# Patient Record
Sex: Female | Born: 1955 | Race: Black or African American | Hispanic: No | Marital: Married | Smoking: Former smoker
Health system: Southern US, Community
[De-identification: ages and names within clinical notes are randomized; demographics above are authoritative.]

## PROBLEM LIST (undated history)

## (undated) DIAGNOSIS — F419 Anxiety disorder, unspecified: Secondary | ICD-10-CM

## (undated) DIAGNOSIS — H9319 Tinnitus, unspecified ear: Secondary | ICD-10-CM

## (undated) DIAGNOSIS — R7303 Prediabetes: Secondary | ICD-10-CM

## (undated) DIAGNOSIS — M1990 Unspecified osteoarthritis, unspecified site: Secondary | ICD-10-CM

## (undated) DIAGNOSIS — G4730 Sleep apnea, unspecified: Secondary | ICD-10-CM

## (undated) DIAGNOSIS — R0602 Shortness of breath: Secondary | ICD-10-CM

## (undated) DIAGNOSIS — M25551 Pain in right hip: Secondary | ICD-10-CM

## (undated) DIAGNOSIS — R000 Tachycardia, unspecified: Secondary | ICD-10-CM

## (undated) DIAGNOSIS — I10 Essential (primary) hypertension: Secondary | ICD-10-CM

## (undated) DIAGNOSIS — L409 Psoriasis, unspecified: Secondary | ICD-10-CM

## (undated) DIAGNOSIS — I4891 Unspecified atrial fibrillation: Secondary | ICD-10-CM

## (undated) DIAGNOSIS — I499 Cardiac arrhythmia, unspecified: Secondary | ICD-10-CM

## (undated) DIAGNOSIS — Z86010 Personal history of colonic polyps: Secondary | ICD-10-CM

## (undated) DIAGNOSIS — M87051 Idiopathic aseptic necrosis of right femur: Secondary | ICD-10-CM

## (undated) HISTORY — DX: Morbid (severe) obesity due to excess calories: E66.01

## (undated) HISTORY — DX: Pain in right hip: M25.551

## (undated) HISTORY — DX: Unspecified atrial fibrillation: I48.91

## (undated) HISTORY — DX: Idiopathic aseptic necrosis of right femur: M87.051

## (undated) HISTORY — DX: Tachycardia, unspecified: R00.0

## (undated) HISTORY — PX: JOINT REPLACEMENT: SHX530

## (undated) HISTORY — DX: Essential (primary) hypertension: I10

## (undated) HISTORY — DX: Psoriasis, unspecified: L40.9

## (undated) HISTORY — DX: Tinnitus, unspecified ear: H93.19

## (undated) HISTORY — PX: COLONOSCOPY: SHX174

## (undated) HISTORY — DX: Personal history of colonic polyps: Z86.010

---

## 2001-09-12 ENCOUNTER — Other Ambulatory Visit: Admission: RE | Admit: 2001-09-12 | Discharge: 2001-09-12 | Payer: Self-pay | Admitting: Family Medicine

## 2002-12-21 ENCOUNTER — Other Ambulatory Visit: Admission: RE | Admit: 2002-12-21 | Discharge: 2002-12-21 | Payer: Self-pay | Admitting: Family Medicine

## 2005-02-15 ENCOUNTER — Ambulatory Visit (HOSPITAL_COMMUNITY): Admission: RE | Admit: 2005-02-15 | Discharge: 2005-02-15 | Payer: Self-pay | Admitting: Geriatric Medicine

## 2005-08-16 DIAGNOSIS — L409 Psoriasis, unspecified: Secondary | ICD-10-CM | POA: Insufficient documentation

## 2007-06-14 ENCOUNTER — Other Ambulatory Visit: Admission: RE | Admit: 2007-06-14 | Discharge: 2007-06-14 | Payer: Self-pay | Admitting: Family Medicine

## 2008-01-02 ENCOUNTER — Emergency Department (HOSPITAL_COMMUNITY): Admission: EM | Admit: 2008-01-02 | Discharge: 2008-01-02 | Payer: Self-pay | Admitting: Emergency Medicine

## 2008-02-22 ENCOUNTER — Ambulatory Visit (HOSPITAL_BASED_OUTPATIENT_CLINIC_OR_DEPARTMENT_OTHER): Admission: RE | Admit: 2008-02-22 | Discharge: 2008-02-22 | Payer: Self-pay | Admitting: Family Medicine

## 2008-03-21 ENCOUNTER — Encounter: Admission: RE | Admit: 2008-03-21 | Discharge: 2008-03-21 | Payer: Self-pay | Admitting: Family Medicine

## 2008-10-02 ENCOUNTER — Emergency Department (HOSPITAL_COMMUNITY): Admission: EM | Admit: 2008-10-02 | Discharge: 2008-10-02 | Payer: Self-pay | Admitting: Family Medicine

## 2009-06-13 ENCOUNTER — Encounter: Admission: RE | Admit: 2009-06-13 | Discharge: 2009-06-13 | Payer: Self-pay | Admitting: Family Medicine

## 2009-08-16 LAB — HM PAP SMEAR

## 2010-05-20 ENCOUNTER — Emergency Department (HOSPITAL_COMMUNITY): Admission: EM | Admit: 2010-05-20 | Discharge: 2010-05-20 | Payer: Self-pay | Admitting: Family Medicine

## 2010-06-22 ENCOUNTER — Encounter: Admission: RE | Admit: 2010-06-22 | Discharge: 2010-06-22 | Payer: Self-pay | Admitting: Family Medicine

## 2010-09-20 ENCOUNTER — Other Ambulatory Visit: Payer: Self-pay | Admitting: Family Medicine

## 2010-09-20 ENCOUNTER — Encounter: Payer: Self-pay | Admitting: Family Medicine

## 2010-09-20 ENCOUNTER — Ambulatory Visit (INDEPENDENT_AMBULATORY_CARE_PROVIDER_SITE_OTHER): Payer: Self-pay | Admitting: Family Medicine

## 2010-09-20 ENCOUNTER — Ambulatory Visit
Admission: RE | Admit: 2010-09-20 | Discharge: 2010-09-20 | Disposition: A | Discharge status: Home / Self Care | Payer: Self-pay | Source: Ambulatory Visit | Attending: Family Medicine | Admitting: Family Medicine

## 2010-09-20 DIAGNOSIS — R05 Cough: Secondary | ICD-10-CM

## 2010-09-20 DIAGNOSIS — J069 Acute upper respiratory infection, unspecified: Secondary | ICD-10-CM

## 2010-09-20 DIAGNOSIS — R059 Cough, unspecified: Secondary | ICD-10-CM

## 2010-09-22 ENCOUNTER — Telehealth (INDEPENDENT_AMBULATORY_CARE_PROVIDER_SITE_OTHER): Payer: Self-pay | Admitting: *Deleted

## 2010-10-01 NOTE — Progress Notes (Signed)
  Phone Note Outgoing Call   Call placed by: Clemens Catholic LPN,  September 22, 2010 3:59 PM Call placed to: Patient Summary of Call: call back: left message to call back if any questions or concerns. Initial call taken by: Clemens Catholic LPN,  September 22, 2010 3:59 PM

## 2010-10-01 NOTE — Assessment & Plan Note (Signed)
Summary: COUGH/WSE Room 2   Vital Signs:  Patient Profile:   55 Years Old Female CC:      Cough x 3 weeks Height:     66 inches Weight:      230 pounds O2 Sat:      97 % O2 treatment:    Room Air Temp:     98.6 degrees F oral Pulse rate:   72 / minute Pulse rhythm:   regular Resp:     18 per minute BP sitting:   145 / 88  (left arm) Cuff size:   large  Vitals Entered By: Emilio Math (September 20, 2010 2:09 PM)                  Current Allergies: No known allergies History of Present Illness Chief Complaint: Cough x 3 weeks History of Present Illness:  Subjective: Patient complains of URI symptoms that started 3 weeks ago with nasal congestion, followed immediately by a cough that has persisted.  Her cough is generally nonproductive and worse at night.  She has a similar illness in Oct/Nov that lasted several weeks for which she took a Z-pack  She has psoriasis and takes Enbrel No sore throat  No pleuritic pain No wheezing + post-nasal drainage No sinus pain/pressure No itchy/red eyes No earache No hemoptysis No SOB No fever/chills, + night sweats No nausea No vomiting No abdominal pain No diarrhea No skin rashes + fatigue No myalgias No headache Used OTC meds without relief   REVIEW OF SYSTEMS Constitutional Symptoms      Denies fever, chills, night sweats, weight loss, weight gain, and fatigue.  Eyes       Denies change in vision, eye pain, eye discharge, glasses, contact lenses, and eye surgery. Ear/Nose/Throat/Mouth       Denies hearing loss/aids, change in hearing, ear pain, ear discharge, dizziness, frequent runny nose, frequent nose bleeds, sinus problems, sore throat, hoarseness, and tooth pain or bleeding.  Respiratory       Complains of productive cough.      Denies dry cough, wheezing, shortness of breath, asthma, bronchitis, and emphysema/COPD.  Cardiovascular       Denies murmurs, chest pain, and tires easily with exhertion.     Gastrointestinal       Denies stomach pain, nausea/vomiting, diarrhea, constipation, blood in bowel movements, and indigestion. Genitourniary       Denies painful urination, kidney stones, and loss of urinary control. Neurological       Denies paralysis, seizures, and fainting/blackouts. Musculoskeletal       Denies muscle pain, joint pain, joint stiffness, decreased range of motion, redness, swelling, muscle weakness, and gout.  Skin       Denies bruising, unusual mles/lumps or sores, and hair/skin or nail changes.  Psych       Denies mood changes, temper/anger issues, anxiety/stress, speech problems, depression, and sleep problems.  Past History:  Past Medical History: Psoriosis  Past Surgical History: Denies surgical history  Family History: Mother, Diabetes, HTN, Hyperthyroid Father, D  Social History: Non smoker ETOH-no No Drugs [place]   Objective:  No acute distress  Eyes:  Pupils are equal, round, and reactive to light and accomdation.  Extraocular movement is intact.  Conjunctivae are not inflamed.  Ears:  Canals normal.  Tympanic membranes normal.   Nose:  Normal septum.  Normal turbinates, mildly congested.   No sinus tenderness present.  Pharynx:  Normal  Neck:  Supple.   Tender  shotty posterior nodes are palpated bilaterally.  Lungs:  Clear to auscultation.  Breath sounds are equal.  Heart:  Regular rate and rhythm without murmurs, rubs, or gallops.  Abdomen:  Nontender without masses or hepatosplenomegaly.  Bowel sounds are present.  No CVA or flank tenderness.  Extremities:  Trace edema Chest X-ray negative Assessment New Problems: UPPER RESPIRATORY INFECTION, ACUTE (ICD-465.9) COUGH (ICD-786.2)  NO EVIDENCE BACTERIAL INFECTION TODAY.  Plan New Medications/Changes: AZITHROMYCIN 250 MG TABS (AZITHROMYCIN) Two tabs by mouth on day 1, then 1 tab daily on days 2 through 5 (Rx void after 09/28/10)  #6 tabs x 0, 09/20/2010, Donna Christen  MD BENZONATATE 200 MG CAPS (BENZONATATE) One by mouth hs as needed cough  #12 x 0, 09/20/2010, Donna Christen MD  New Orders: T-Chest x-ray, 2 views [71020] Pulse Oximetry (single measurment) [57846] New Patient Level III [96295] Planning Comments:   Treat symptomatically for now:  Increase fluid intake, begin expectorant/decongestant, cough suppressant at bedtime.  If fever/chills/sweats persist, or if not improving 5  days begin Z-pack (given Rx to hold).  Followup with PCP if not improving 7 to 10 days.  The patient and/or caregiver has been counseled thoroughly with regard to medications prescribed including dosage, schedule, interactions, rationale for use, and possible side effects and they verbalize understanding.  Diagnoses and expected course of recovery discussed and will return if not improved as expected or if the condition worsens. Patient and/or caregiver verbalized understanding.  Prescriptions: AZITHROMYCIN 250 MG TABS (AZITHROMYCIN) Two tabs by mouth on day 1, then 1 tab daily on days 2 through 5 (Rx void after 09/28/10)  #6 tabs x 0   Entered and Authorized by:   Donna Christen MD   Signed by:   Donna Christen MD on 09/20/2010   Method used:   Print then Give to Patient   RxID:   [id number] BENZONATATE 200 MG CAPS (BENZONATATE) One by mouth hs as needed cough  #12 x 0   Entered and Authorized by:   Donna Christen MD   Signed by:   Donna Christen MD on 09/20/2010   Method used:   Print then Give to Patient   RxID:   [id number]   Patient Instructions: 1)  Take Mucinex D (guaifenesin with decongestant) twice daily for congestion. 2)  Increase fluid intake, rest. 3)  May use Afrin nasal spray (or generic oxymetazoline) twice daily for about 5 days.  Also recommend using saline nasal spray several times daily and/or saline nasal irrigation. 4)  Begin Azithromycin if not improving about 5 days or if persistent fever develops. 5)  Followup with family doctor if not improving 7 to 10 days.   Orders Added: 1)  T-Chest x-ray, 2 views [71020] 2)  Pulse Oximetry (single measurment) [94760] 3)  New Patient Level III [27253]

## 2011-01-17 ENCOUNTER — Inpatient Hospital Stay (INDEPENDENT_AMBULATORY_CARE_PROVIDER_SITE_OTHER)
Admission: RE | Admit: 2011-01-17 | Discharge: 2011-01-17 | Disposition: A | Discharge status: Home / Self Care | Payer: Commercial Managed Care - PPO | Source: Ambulatory Visit | Attending: Emergency Medicine | Admitting: Emergency Medicine

## 2011-01-17 DIAGNOSIS — L259 Unspecified contact dermatitis, unspecified cause: Secondary | ICD-10-CM

## 2011-07-18 ENCOUNTER — Ambulatory Visit (HOSPITAL_BASED_OUTPATIENT_CLINIC_OR_DEPARTMENT_OTHER)
Admission: RE | Admit: 2011-07-18 | Discharge: 2011-07-18 | Disposition: A | Discharge status: Home / Self Care | Payer: 59 | Source: Ambulatory Visit | Attending: Emergency Medicine | Admitting: Emergency Medicine

## 2011-07-18 ENCOUNTER — Other Ambulatory Visit (HOSPITAL_BASED_OUTPATIENT_CLINIC_OR_DEPARTMENT_OTHER): Payer: Self-pay | Admitting: Emergency Medicine

## 2011-07-18 DIAGNOSIS — M25559 Pain in unspecified hip: Secondary | ICD-10-CM | POA: Insufficient documentation

## 2011-09-14 ENCOUNTER — Encounter: Payer: Self-pay | Admitting: Pharmacist

## 2011-09-14 ENCOUNTER — Ambulatory Visit (INDEPENDENT_AMBULATORY_CARE_PROVIDER_SITE_OTHER): Payer: Self-pay | Admitting: Pharmacist

## 2011-09-14 DIAGNOSIS — L409 Psoriasis, unspecified: Secondary | ICD-10-CM

## 2011-09-14 DIAGNOSIS — L408 Other psoriasis: Secondary | ICD-10-CM

## 2011-09-14 DIAGNOSIS — E1159 Type 2 diabetes mellitus with other circulatory complications: Secondary | ICD-10-CM | POA: Insufficient documentation

## 2011-09-14 DIAGNOSIS — I10 Essential (primary) hypertension: Secondary | ICD-10-CM | POA: Insufficient documentation

## 2011-09-14 NOTE — Progress Notes (Signed)
  Subjective:    Patient ID: Monica [name], female    DOB: December 11, 1955, 56 y.o.   MRN: [id number]  HPI  Reviewed and agree with Dr. Macky Lower management.    Review of Systems     Objective:   Physical Exam        Assessment & Plan:

## 2011-09-14 NOTE — Assessment & Plan Note (Signed)
Following medication review, no suggestions for change.  Complete medication list provided to patient.  Total time in face to face medication review: 15 minutes.  Patient seen with: Tamala Bari, Pharm D Candidate

## 2011-09-14 NOTE — Progress Notes (Signed)
  Subjective:    Patient ID: Monica [name], female    DOB: 05/31/1956, 56 y.o.   MRN: [id number]  HPI Patient arrives in good spirits for medication review.  Reports seeing Dr. Hall Busing as primary care provider, Dr. Karlyn Agee  as dermatologist, Dr. Dierdre Forth as rheumatologist (upcoming appointment). Reports being diagnosed with psoriasis since 2007 and states she is under acceptable level of control.      Review of Systems     Objective:   Physical Exam        Assessment & Plan:  Following medication review, no suggestions for change.  Complete medication list provided to patient.  Total time in face to face medication review: 15 minutes.  Patient seen with: Tamala Bari, Pharm D Candidate

## 2011-11-10 ENCOUNTER — Other Ambulatory Visit: Payer: Self-pay | Admitting: Internal Medicine

## 2011-11-10 DIAGNOSIS — M25551 Pain in right hip: Secondary | ICD-10-CM

## 2011-11-11 ENCOUNTER — Other Ambulatory Visit (HOSPITAL_COMMUNITY): Payer: Self-pay | Admitting: Internal Medicine

## 2011-11-11 DIAGNOSIS — M25551 Pain in right hip: Secondary | ICD-10-CM

## 2011-11-16 ENCOUNTER — Ambulatory Visit (HOSPITAL_COMMUNITY)
Admission: RE | Admit: 2011-11-16 | Discharge: 2011-11-16 | Disposition: A | Discharge status: Home / Self Care | Payer: 59 | Source: Ambulatory Visit | Attending: Internal Medicine | Admitting: Internal Medicine

## 2011-11-16 DIAGNOSIS — M25551 Pain in right hip: Secondary | ICD-10-CM

## 2011-11-23 ENCOUNTER — Ambulatory Visit (HOSPITAL_BASED_OUTPATIENT_CLINIC_OR_DEPARTMENT_OTHER)
Admission: RE | Admit: 2011-11-23 | Discharge: 2011-11-23 | Disposition: A | Discharge status: Home / Self Care | Payer: 59 | Source: Ambulatory Visit | Attending: Internal Medicine | Admitting: Internal Medicine

## 2011-11-23 DIAGNOSIS — M87059 Idiopathic aseptic necrosis of unspecified femur: Secondary | ICD-10-CM

## 2011-11-23 DIAGNOSIS — M25459 Effusion, unspecified hip: Secondary | ICD-10-CM

## 2011-11-23 DIAGNOSIS — M8970 Major osseous defect, unspecified site: Secondary | ICD-10-CM

## 2011-11-23 DIAGNOSIS — M25559 Pain in unspecified hip: Secondary | ICD-10-CM | POA: Insufficient documentation

## 2011-11-23 DIAGNOSIS — R609 Edema, unspecified: Secondary | ICD-10-CM

## 2011-12-27 ENCOUNTER — Other Ambulatory Visit (HOSPITAL_COMMUNITY): Payer: Self-pay | Admitting: Orthopedic Surgery

## 2011-12-27 DIAGNOSIS — I96 Gangrene, not elsewhere classified: Secondary | ICD-10-CM

## 2011-12-27 DIAGNOSIS — M25559 Pain in unspecified hip: Secondary | ICD-10-CM

## 2011-12-29 ENCOUNTER — Ambulatory Visit (HOSPITAL_COMMUNITY)
Admission: RE | Admit: 2011-12-29 | Discharge: 2011-12-29 | Disposition: A | Discharge status: Home / Self Care | Payer: 59 | Source: Ambulatory Visit | Attending: Orthopedic Surgery | Admitting: Orthopedic Surgery

## 2011-12-29 ENCOUNTER — Other Ambulatory Visit (HOSPITAL_COMMUNITY): Payer: Self-pay | Admitting: Orthopedic Surgery

## 2011-12-29 DIAGNOSIS — M8708 Idiopathic aseptic necrosis of bone, other site: Secondary | ICD-10-CM | POA: Insufficient documentation

## 2011-12-29 DIAGNOSIS — I96 Gangrene, not elsewhere classified: Secondary | ICD-10-CM

## 2011-12-29 DIAGNOSIS — M25559 Pain in unspecified hip: Secondary | ICD-10-CM | POA: Insufficient documentation

## 2011-12-29 MED ORDER — METHYLPREDNISOLONE ACETATE 40 MG/ML INJ SUSP (RADIOLOG: 80.0000 mg | Freq: Once | INTRAMUSCULAR | Status: DC

## 2012-01-06 NOTE — Op Note (Signed)
NAME:  Monica [name], Monica [name]              ACCOUNT NO.:  [id number]  MEDICAL RECORD NO.:  [id number]  LOCATION:  XRAY                         FACILITY:  [facility]  PHYSICIAN:  Marlowe Kays, M.D.  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  OPERATION:  Injection of right hip joint under fluoro and x-ray with steroid and Xylocaine.  SURGEON:  Marlowe Kays, M.D.  ASSISTANT:  Radiology technician.  ANESTHESIA:  Local.  JUSTIFICATION FOR PROCEDURE:  She has AVN of her right femoral head with slight collapse and at this time does not feel ready for a hip replacement and consequently is here for the above procedure to try and delay it.  PROCEDURE:  She was placed supine on the fluoroscopy table and x-ray. After time-out performed the right lower abdomen and hip area were prepped with Betadine and draped in sterile field.  After localizing the hip joint before the prep and marking it, I then infiltrated the skin and deeper tissues with Xylocaine, working a spinal needle down into the hip joint.  When x-ray confirmed that this is where we were I injected the joint with 80 mg of Depo Medrol and 7 mL of the Xylocaine.  The needle was then removed and we had her stand and walk and her preinjection pain had been completely relieved.  She left the x-ray room without complication.          ______________________________ Marlowe Kays, M.D.     JA/MEDQ  D:  01/05/2012  T:  01/06/2012  Job:  [id number]

## 2012-06-19 ENCOUNTER — Other Ambulatory Visit: Payer: Self-pay | Admitting: Orthopedic Surgery

## 2012-06-19 MED ORDER — DEXAMETHASONE SODIUM PHOSPHATE 10 MG/ML IJ SOLN: 10.0000 mg | Freq: Once | INTRAMUSCULAR | Status: DC

## 2012-06-19 MED ORDER — BUPIVACAINE LIPOSOME 1.3 % IJ SUSP: 20.0000 mL | Freq: Once | INTRAMUSCULAR | Status: DC

## 2012-06-19 NOTE — Progress Notes (Signed)
Preoperative surgical orders have been place into the Epic hospital system for Monica [name] on 06/19/2012, 8:24 AM  by Patrica Duel for surgery on 07/21/12.  Preop Total Hip orders including Experel Injecion, IV Tylenol, and IV Decadron as long as there are no contraindications to the above medications. Avel Peace, PA-C

## 2012-06-26 ENCOUNTER — Other Ambulatory Visit (HOSPITAL_BASED_OUTPATIENT_CLINIC_OR_DEPARTMENT_OTHER): Payer: Self-pay | Admitting: Family Medicine

## 2012-06-26 DIAGNOSIS — Z1231 Encounter for screening mammogram for malignant neoplasm of breast: Secondary | ICD-10-CM

## 2012-06-28 ENCOUNTER — Ambulatory Visit (HOSPITAL_BASED_OUTPATIENT_CLINIC_OR_DEPARTMENT_OTHER)
Admission: RE | Admit: 2012-06-28 | Discharge: 2012-06-28 | Disposition: A | Discharge status: Home / Self Care | Payer: 59 | Source: Ambulatory Visit | Attending: Family Medicine | Admitting: Family Medicine

## 2012-06-28 DIAGNOSIS — Z1231 Encounter for screening mammogram for malignant neoplasm of breast: Secondary | ICD-10-CM | POA: Insufficient documentation

## 2012-07-03 LAB — PROTIME-INR

## 2012-07-05 ENCOUNTER — Ambulatory Visit: Payer: 59 | Admitting: Cardiology

## 2012-07-06 ENCOUNTER — Ambulatory Visit (INDEPENDENT_AMBULATORY_CARE_PROVIDER_SITE_OTHER): Payer: 59 | Admitting: Cardiology

## 2012-07-06 VITALS — BP 132/86 | HR 105 | Ht 65.5 in | Wt 247.0 lb

## 2012-07-06 DIAGNOSIS — I4891 Unspecified atrial fibrillation: Secondary | ICD-10-CM

## 2012-07-06 DIAGNOSIS — I10 Essential (primary) hypertension: Secondary | ICD-10-CM

## 2012-07-06 NOTE — Progress Notes (Signed)
Monica [name] Date of Birth: February 22, 1956 Medical Record #[id number]  History of Present Illness: Mrs. Monica [name] is a 56 year old black female seen at the request of Dr. Alberteen Sam for evaluation of atrial fibrillation. The patient states she was in her usual state of health until she went for preoperative assessment for hip surgery. She was found at that time to be in atrial fibrillation. Her ventricular response was 114 beats per minute. She was completely asymptomatic. She denies any history of palpitations, dizziness, chest pain, or shortness of breath. She does describe chronic lower extremity edema that has been present for years. She was started on bystolic and Xarelto. She continues to feel well. She does have a history of hypertension.  Current Outpatient Prescriptions on File Prior to Visit  Medication Sig Dispense Refill  . clobetasol cream (TEMOVATE) 0.05 % Apply as needed  30 g  0  . desonide (DESOWEN) 0.05 % lotion Apply to rash on face or under breasts twice daily for 7 to 10 days  59 mL  0  . Etanercept 25 MG/0.5ML SOLN Inject 25 mg into the skin once a week.      . hydrochlorothiazide (HYDRODIURIL) 25 MG tablet Take 0.5 tablets (12.5 mg total) by mouth daily.      . Nebivolol HCl (BYSTOLIC PO) Take by mouth as directed.      . Rivaroxaban (XARELTO PO) Take by mouth as directed.        No Known Allergies  Past Medical History  Diagnosis Date  . Atrial fibrillation   . Right hip pain   . Hypertension   . Morbid obesity   . Psoriasis     History reviewed. No pertinent past surgical history.  History  Smoking status  . Former Smoker  . Types: Cigarettes  . Quit date: 09/13/1982  Smokeless tobacco  . Not on file    History  Alcohol Use: Not on file    Family History  Problem Relation Age of Onset  . Hypertension Mother   . Diabetes Mother   . Alzheimer's disease Mother   . Thyroid disease Mother     Review of Systems: The review of systems is positive for  severe arthritis in her right hip. She walks with a cane. All other systems were reviewed and are negative.  Physical Exam: BP 132/86  Pulse 105  Ht 5' 5.5" (1.664 m)  Wt 247 lb (112.038 kg)  BMI 40.48 kg/m2  SpO2 95% She is an obese black female in no acute distress. She is normocephalic, atraumatic. Pupils are equal round and reactive to light accommodation. Extraocular movements are full. Sclera are clear. Oropharynx is clear. Neck is supple without JVD, adenopathy, thyromegaly, or bruits. Lungs are clear. Cardiac exam reveals an irregular rate and rhythm without gallop, murmur, or click. Abdomen is obese, soft, nontender. No masses or bruits. Extremities reveal 1+ edema. Patient is wearing support stockings. Skin is warm and dry. She is alert and oriented x3. Cranial nerves II through XII are intact. No focal findings.  LABORATORY DATA: ECG demonstrates atrial fibrillation with a controlled ventricular response of 90 beats per minute. There is nonspecific T-wave abnormality.  Assessment / Plan: 1. Atrial fibrillation. Duration is unknown. Patient is asymptomatic. Rate control appears to be adequate on bystolic. She is on anticoagulation. We will confirm that she is on appropriate laboratory evaluation including chemistries, CBC, and thyroid studies. We will schedule her for an echocardiogram. If her echocardiogram is satisfactory we will clear her for planned  hip surgery. I will plan reassessment in 3 months. If she remains asymptomatic there may be little benefit to trying to restore sinus rhythm.  2. Hypertension, controlled.  3. Severe osteoarthritis of the right hip.

## 2012-07-06 NOTE — Patient Instructions (Signed)
We will schedule you for an echocardiogram and get the results of your lab work  If your Echo looks OK we will clear you for surgery

## 2012-07-07 ENCOUNTER — Ambulatory Visit (HOSPITAL_COMMUNITY): Payer: 59 | Attending: Cardiology | Admitting: Radiology

## 2012-07-07 DIAGNOSIS — I4891 Unspecified atrial fibrillation: Secondary | ICD-10-CM

## 2012-07-07 DIAGNOSIS — Z87891 Personal history of nicotine dependence: Secondary | ICD-10-CM | POA: Insufficient documentation

## 2012-07-07 DIAGNOSIS — I517 Cardiomegaly: Secondary | ICD-10-CM | POA: Insufficient documentation

## 2012-07-07 DIAGNOSIS — I10 Essential (primary) hypertension: Secondary | ICD-10-CM | POA: Insufficient documentation

## 2012-07-07 NOTE — Progress Notes (Signed)
Echocardiogram performed.  

## 2012-07-10 ENCOUNTER — Encounter (HOSPITAL_COMMUNITY): Payer: Self-pay | Admitting: Pharmacy Technician

## 2012-07-12 ENCOUNTER — Encounter (HOSPITAL_COMMUNITY)
Admission: RE | Admit: 2012-07-12 | Discharge: 2012-07-12 | Disposition: A | Discharge status: Home / Self Care | Payer: 59 | Source: Ambulatory Visit | Attending: Orthopedic Surgery | Admitting: Orthopedic Surgery

## 2012-07-12 ENCOUNTER — Ambulatory Visit (HOSPITAL_COMMUNITY)
Admission: RE | Admit: 2012-07-12 | Discharge: 2012-07-12 | Disposition: A | Discharge status: Home / Self Care | Payer: 59 | Source: Ambulatory Visit | Attending: Orthopedic Surgery | Admitting: Orthopedic Surgery

## 2012-07-12 ENCOUNTER — Telehealth: Payer: Self-pay | Admitting: *Deleted

## 2012-07-12 ENCOUNTER — Encounter (HOSPITAL_COMMUNITY): Payer: Self-pay

## 2012-07-12 DIAGNOSIS — Z01818 Encounter for other preprocedural examination: Secondary | ICD-10-CM | POA: Insufficient documentation

## 2012-07-12 LAB — PROTIME-INR: Prothrombin Time: 14.5 seconds (ref 11.6–15.2)

## 2012-07-12 LAB — COMPREHENSIVE METABOLIC PANEL
ALT: 33 U/L (ref 0–35)
BUN: 11 mg/dL (ref 6–23)
CO2: 27 mEq/L (ref 19–32)
Calcium: 9.4 mg/dL (ref 8.4–10.5)
Creatinine, Ser: 0.68 mg/dL (ref 0.50–1.10)
GFR calc Af Amer: >90 mL/min (ref >90)
GFR calc non Af Amer: >90 mL/min (ref >90)
Glucose, Bld: 93 mg/dL (ref 70–99)
Total Protein: 7.5 g/dL (ref 6.0–8.3)

## 2012-07-12 LAB — URINALYSIS, ROUTINE W REFLEX MICROSCOPIC
Glucose, UA: NEGATIVE mg/dL
Hgb urine dipstick: NEGATIVE
Ketones, ur: NEGATIVE mg/dL
Protein, ur: NEGATIVE mg/dL
Urobilinogen, UA: 1 mg/dL (ref 0.0–1.0)

## 2012-07-12 LAB — CBC
HCT: 37.7 % (ref 36.0–46.0)
Hemoglobin: 12.5 g/dL (ref 12.0–15.0)
MCH: 28.7 pg (ref 26.0–34.0)
MCHC: 33.2 g/dL (ref 30.0–36.0)
MCV: 86.5 fL (ref 78.0–100.0)
RBC: 4.36 MIL/uL (ref 3.87–5.11)

## 2012-07-12 LAB — URINE MICROSCOPIC-ADD ON

## 2012-07-12 LAB — SURGICAL PCR SCREEN: MRSA, PCR: NEGATIVE

## 2012-07-12 NOTE — Telephone Encounter (Signed)
Received call from pat at dr zanard's office. The pt has been cleared for surgery but they have no instructions regarding the pts xarelto. Per dr [place] the xarelto can be stopped 48 hours prior to the procedure and can be resumed post op when okay with surgery. Left message of above on dr zanard's voice mail.

## 2012-07-12 NOTE — Patient Instructions (Signed)
20      Your procedure is scheduled on:  Friday 07/21/2012 0915 am  Report to [facility] at  0645 AM.  Call this number if you have problems the morning of surgery: [phone]   Remember:   Do not eat food or drink liquids after midnight!  Take these medicines the morning of surgery with A SIP OF WATER: Bystolic   Do not bring valuables to the hospital.  .  Leave suitcase in the car. After surgery it may be brought to your room.  For patients admitted to the hospital, checkout time is 11:00 AM the day of              Discharge.    Special Instructions: See [facility] Preparing  For Surgery Instruction Sheet. Do not wear jewelry, lotions powders, perfumes. Women do not shave legs or underarms for 12 hours before showers. Contacts, partial plates, or dentures may not be worn into surgery.                          Patients discharged the day of surgery will not be allowed to drive home. If going home the same day of surgery, must have someone stay with you first 24 hrs.at home and arrange for someone to drive you home from the              Hospital. YOUR DRIVER IS: Jonny Ruiz -fiancee   Please read over the following fact sheets that you were given: MRSA INFORMATION,INCENTIVE SPIROMETRY SHEET, SLEEP APNEA SHEET, BLOOD TRANSFUSION                            Telford Nab.Aysia Lowder,RN,BSN     [phone]

## 2012-07-14 NOTE — Addendum Note (Signed)
Addended by: Jarvis Newcomer on: 07/14/2012 01:54 PM   Modules accepted: Orders

## 2012-07-21 ENCOUNTER — Inpatient Hospital Stay (HOSPITAL_COMMUNITY)
Admission: RE | Admit: 2012-07-21 | Discharge: 2012-07-24 | DRG: 470 | Disposition: A | Discharge status: Home Health | Payer: 59 | Source: Ambulatory Visit | Attending: Orthopedic Surgery | Admitting: Orthopedic Surgery

## 2012-07-21 ENCOUNTER — Encounter (HOSPITAL_COMMUNITY)
Admission: RE | Disposition: A | Discharge status: Home Health | Payer: Self-pay | Source: Ambulatory Visit | Attending: Orthopedic Surgery

## 2012-07-21 ENCOUNTER — Ambulatory Visit (HOSPITAL_COMMUNITY): Payer: 59

## 2012-07-21 ENCOUNTER — Other Ambulatory Visit: Payer: Self-pay | Admitting: Orthopedic Surgery

## 2012-07-21 ENCOUNTER — Encounter (HOSPITAL_COMMUNITY): Payer: Self-pay | Admitting: Anesthesiology

## 2012-07-21 ENCOUNTER — Ambulatory Visit (HOSPITAL_COMMUNITY): Payer: 59 | Admitting: Anesthesiology

## 2012-07-21 ENCOUNTER — Encounter (HOSPITAL_COMMUNITY): Payer: Self-pay

## 2012-07-21 DIAGNOSIS — D62 Acute posthemorrhagic anemia: Secondary | ICD-10-CM | POA: Diagnosis not present

## 2012-07-21 DIAGNOSIS — L409 Psoriasis, unspecified: Secondary | ICD-10-CM

## 2012-07-21 DIAGNOSIS — I4891 Unspecified atrial fibrillation: Secondary | ICD-10-CM | POA: Diagnosis present

## 2012-07-21 DIAGNOSIS — M87059 Idiopathic aseptic necrosis of unspecified femur: Principal | ICD-10-CM | POA: Diagnosis present

## 2012-07-21 DIAGNOSIS — Z79899 Other long term (current) drug therapy: Secondary | ICD-10-CM

## 2012-07-21 DIAGNOSIS — I10 Essential (primary) hypertension: Secondary | ICD-10-CM | POA: Diagnosis present

## 2012-07-21 DIAGNOSIS — Z96649 Presence of unspecified artificial hip joint: Secondary | ICD-10-CM

## 2012-07-21 DIAGNOSIS — Z7901 Long term (current) use of anticoagulants: Secondary | ICD-10-CM

## 2012-07-21 HISTORY — PX: FRACTURE SURGERY: SHX138

## 2012-07-21 HISTORY — PX: TOTAL HIP ARTHROPLASTY: SHX124

## 2012-07-21 LAB — TYPE AND SCREEN
ABO/RH(D): B POS
Antibody Screen: NEGATIVE

## 2012-07-21 SURGERY — ARTHROPLASTY, HIP, TOTAL,POSTERIOR APPROACH
Anesthesia: General | Site: Hip | Laterality: Right | Wound class: Clean

## 2012-07-21 MED ORDER — METHOCARBAMOL 500 MG PO TABS
500.0000 mg | ORAL_TABLET | Freq: Four times a day (QID) | ORAL | Status: DC | PRN
  Administered 2012-07-22 – 2012-07-24 (×5): 500 mg via ORAL
  Filled 2012-07-21 (×5): qty 1

## 2012-07-21 MED ORDER — DEXAMETHASONE SODIUM PHOSPHATE 10 MG/ML IJ SOLN
INTRAMUSCULAR | Status: DC | PRN
  Administered 2012-07-21: 10 mg via INTRAVENOUS

## 2012-07-21 MED ORDER — PROPOFOL 10 MG/ML IV BOLUS
INTRAVENOUS | Status: DC | PRN
  Administered 2012-07-21: 170 mg via INTRAVENOUS

## 2012-07-21 MED ORDER — HYDROCHLOROTHIAZIDE 12.5 MG PO CAPS
12.5000 mg | ORAL_CAPSULE | Freq: Every day | ORAL | Status: DC
  Administered 2012-07-21 – 2012-07-24 (×4): 12.5 mg via ORAL
  Filled 2012-07-21 (×4): qty 1

## 2012-07-21 MED ORDER — RIVAROXABAN 10 MG PO TABS
10.0000 mg | ORAL_TABLET | Freq: Every day | ORAL | Status: DC
  Administered 2012-07-22 – 2012-07-24 (×3): 10 mg via ORAL
  Filled 2012-07-21 (×4): qty 1

## 2012-07-21 MED ORDER — ONDANSETRON HCL 4 MG/2ML IJ SOLN
4.0000 mg | Freq: Four times a day (QID) | INTRAMUSCULAR | Status: DC | PRN
  Administered 2012-07-21: 4 mg via INTRAVENOUS
  Filled 2012-07-21: qty 2

## 2012-07-21 MED ORDER — 0.9 % SODIUM CHLORIDE (POUR BTL) OPTIME
TOPICAL | Status: DC | PRN
  Administered 2012-07-21: 1000 mL

## 2012-07-21 MED ORDER — METOCLOPRAMIDE HCL 5 MG/ML IJ SOLN: 5.0000 mg | Freq: Three times a day (TID) | INTRAMUSCULAR | Status: DC | PRN

## 2012-07-21 MED ORDER — MIDAZOLAM HCL 5 MG/5ML IJ SOLN
INTRAMUSCULAR | Status: DC | PRN
  Administered 2012-07-21: 2 mg via INTRAVENOUS

## 2012-07-21 MED ORDER — LACTATED RINGERS IV SOLN
INTRAVENOUS | Status: DC
  Administered 2012-07-21: 10:00:00 via INTRAVENOUS
  Administered 2012-07-21: 1000 mL via INTRAVENOUS

## 2012-07-21 MED ORDER — FENTANYL CITRATE 0.05 MG/ML IJ SOLN
INTRAMUSCULAR | Status: DC | PRN
  Administered 2012-07-21: 50 ug via INTRAVENOUS
  Administered 2012-07-21 (×2): 100 ug via INTRAVENOUS

## 2012-07-21 MED ORDER — ACETAMINOPHEN 325 MG PO TABS
650.0000 mg | ORAL_TABLET | Freq: Four times a day (QID) | ORAL | Status: DC | PRN
  Administered 2012-07-24: 650 mg via ORAL
  Filled 2012-07-21: qty 2

## 2012-07-21 MED ORDER — METOCLOPRAMIDE HCL 10 MG PO TABS: 5.0000 mg | ORAL_TABLET | Freq: Three times a day (TID) | ORAL | Status: DC | PRN

## 2012-07-21 MED ORDER — DEXAMETHASONE SODIUM PHOSPHATE 10 MG/ML IJ SOLN
10.0000 mg | Freq: Once | INTRAMUSCULAR | Status: AC
  Filled 2012-07-21: qty 1

## 2012-07-21 MED ORDER — LIDOCAINE HCL (CARDIAC) 20 MG/ML IV SOLN
INTRAVENOUS | Status: DC | PRN
  Administered 2012-07-21: 75 mg via INTRAVENOUS

## 2012-07-21 MED ORDER — ETANERCEPT 25 MG/0.5ML ~~LOC~~ SOSY: 25.0000 mg | PREFILLED_SYRINGE | SUBCUTANEOUS | Status: DC

## 2012-07-21 MED ORDER — BISACODYL 10 MG RE SUPP: 10.0000 mg | Freq: Every day | RECTAL | Status: DC | PRN

## 2012-07-21 MED ORDER — HYDROMORPHONE HCL PF 1 MG/ML IJ SOLN
INTRAMUSCULAR | Status: DC | PRN
  Administered 2012-07-21: 0.5 mg via INTRAVENOUS
  Administered 2012-07-21: 1 mg via INTRAVENOUS
  Administered 2012-07-21: 0.5 mg via INTRAVENOUS

## 2012-07-21 MED ORDER — SODIUM CHLORIDE 0.9 % IJ SOLN
INTRAMUSCULAR | Status: DC | PRN
  Administered 2012-07-21: 50 mL

## 2012-07-21 MED ORDER — ONDANSETRON HCL 4 MG/2ML IJ SOLN
INTRAMUSCULAR | Status: DC | PRN
  Administered 2012-07-21: 4 mg via INTRAVENOUS

## 2012-07-21 MED ORDER — NEBIVOLOL HCL 5 MG PO TABS
5.0000 mg | ORAL_TABLET | Freq: Every day | ORAL | Status: DC
  Administered 2012-07-22 – 2012-07-24 (×3): 5 mg via ORAL
  Filled 2012-07-21 (×3): qty 1

## 2012-07-21 MED ORDER — KCL IN DEXTROSE-NACL 20-5-0.9 MEQ/L-%-% IV SOLN
INTRAVENOUS | Status: DC
  Administered 2012-07-21 – 2012-07-22 (×2): via INTRAVENOUS
  Filled 2012-07-21 (×3): qty 1000

## 2012-07-21 MED ORDER — MENTHOL 3 MG MT LOZG: 1.0000 | LOZENGE | OROMUCOSAL | Status: DC | PRN

## 2012-07-21 MED ORDER — ACETAMINOPHEN 10 MG/ML IV SOLN
1000.0000 mg | Freq: Once | INTRAVENOUS | Status: AC
  Administered 2012-07-21: 1000 mg via INTRAVENOUS

## 2012-07-21 MED ORDER — CEFAZOLIN SODIUM-DEXTROSE 2-3 GM-% IV SOLR
2.0000 g | INTRAVENOUS | Status: AC
  Administered 2012-07-21: 2 g via INTRAVENOUS

## 2012-07-21 MED ORDER — HYDROMORPHONE HCL PF 1 MG/ML IJ SOLN
INTRAMUSCULAR | Status: AC
  Filled 2012-07-21: qty 1

## 2012-07-21 MED ORDER — DEXAMETHASONE 4 MG PO TABS
10.0000 mg | ORAL_TABLET | Freq: Once | ORAL | Status: AC
  Administered 2012-07-22: 10 mg via ORAL
  Filled 2012-07-21: qty 2.5

## 2012-07-21 MED ORDER — SODIUM CHLORIDE 0.9 % IV SOLN: INTRAVENOUS | Status: DC

## 2012-07-21 MED ORDER — FLEET ENEMA 7-19 GM/118ML RE ENEM: 1.0000 | ENEMA | Freq: Once | RECTAL | Status: AC | PRN

## 2012-07-21 MED ORDER — CEFAZOLIN SODIUM-DEXTROSE 2-3 GM-% IV SOLR
2.0000 g | Freq: Four times a day (QID) | INTRAVENOUS | Status: AC
  Administered 2012-07-21 (×2): 2 g via INTRAVENOUS
  Filled 2012-07-21 (×3): qty 50

## 2012-07-21 MED ORDER — LACTATED RINGERS IV SOLN: INTRAVENOUS | Status: DC

## 2012-07-21 MED ORDER — MORPHINE SULFATE 2 MG/ML IJ SOLN
1.0000 mg | INTRAMUSCULAR | Status: DC | PRN
  Administered 2012-07-21 – 2012-07-22 (×5): 2 mg via INTRAVENOUS
  Filled 2012-07-21 (×5): qty 1

## 2012-07-21 MED ORDER — PHENOL 1.4 % MT LIQD: 1.0000 | OROMUCOSAL | Status: DC | PRN

## 2012-07-21 MED ORDER — ACETAMINOPHEN 10 MG/ML IV SOLN
INTRAVENOUS | Status: AC
  Filled 2012-07-21: qty 100

## 2012-07-21 MED ORDER — METHOCARBAMOL 100 MG/ML IJ SOLN
500.0000 mg | Freq: Four times a day (QID) | INTRAVENOUS | Status: DC | PRN
  Administered 2012-07-21: 500 mg via INTRAVENOUS
  Filled 2012-07-21: qty 5

## 2012-07-21 MED ORDER — CEFAZOLIN SODIUM-DEXTROSE 2-3 GM-% IV SOLR
INTRAVENOUS | Status: AC
  Filled 2012-07-21: qty 50

## 2012-07-21 MED ORDER — GLYCOPYRROLATE 0.2 MG/ML IJ SOLN
INTRAMUSCULAR | Status: DC | PRN
  Administered 2012-07-21: 0.2 mg via INTRAVENOUS
  Administered 2012-07-21: .5 mg via INTRAVENOUS

## 2012-07-21 MED ORDER — BUPIVACAINE LIPOSOME 1.3 % IJ SUSP
20.0000 mL | Freq: Once | INTRAMUSCULAR | Status: AC
  Administered 2012-07-21: 20 mL
  Filled 2012-07-21: qty 20

## 2012-07-21 MED ORDER — ROCURONIUM BROMIDE 100 MG/10ML IV SOLN
INTRAVENOUS | Status: DC | PRN
  Administered 2012-07-21: 50 mg via INTRAVENOUS

## 2012-07-21 MED ORDER — ACETAMINOPHEN 10 MG/ML IV SOLN
1000.0000 mg | Freq: Four times a day (QID) | INTRAVENOUS | Status: AC
  Administered 2012-07-21 – 2012-07-22 (×4): 1000 mg via INTRAVENOUS
  Filled 2012-07-21 (×4): qty 100

## 2012-07-21 MED ORDER — HYDROCHLOROTHIAZIDE 25 MG PO TABS
12.5000 mg | ORAL_TABLET | Freq: Every day | ORAL | Status: DC
  Filled 2012-07-21 (×2): qty 0.5

## 2012-07-21 MED ORDER — OXYCODONE HCL 5 MG PO TABS
5.0000 mg | ORAL_TABLET | ORAL | Status: DC | PRN
  Administered 2012-07-22 (×2): 10 mg via ORAL
  Administered 2012-07-22: 5 mg via ORAL
  Administered 2012-07-23 – 2012-07-24 (×8): 10 mg via ORAL
  Filled 2012-07-21 (×11): qty 2

## 2012-07-21 MED ORDER — ACETAMINOPHEN 650 MG RE SUPP: 650.0000 mg | Freq: Four times a day (QID) | RECTAL | Status: DC | PRN

## 2012-07-21 MED ORDER — DIPHENHYDRAMINE HCL 12.5 MG/5ML PO ELIX
12.5000 mg | ORAL_SOLUTION | ORAL | Status: DC | PRN
  Administered 2012-07-23: 12.5 mg via ORAL
  Filled 2012-07-21: qty 5

## 2012-07-21 MED ORDER — POLYETHYLENE GLYCOL 3350 17 G PO PACK
17.0000 g | PACK | Freq: Every day | ORAL | Status: DC | PRN
  Administered 2012-07-23: 17 g via ORAL
  Filled 2012-07-21: qty 1

## 2012-07-21 MED ORDER — HYDROMORPHONE HCL PF 1 MG/ML IJ SOLN
0.2500 mg | INTRAMUSCULAR | Status: DC | PRN
  Administered 2012-07-21 (×2): 0.5 mg via INTRAVENOUS

## 2012-07-21 MED ORDER — DOCUSATE SODIUM 100 MG PO CAPS
100.0000 mg | ORAL_CAPSULE | Freq: Two times a day (BID) | ORAL | Status: DC
  Administered 2012-07-21 – 2012-07-24 (×6): 100 mg via ORAL
  Filled 2012-07-21 (×4): qty 1

## 2012-07-21 MED ORDER — NEOSTIGMINE METHYLSULFATE 1 MG/ML IJ SOLN
INTRAMUSCULAR | Status: DC | PRN
  Administered 2012-07-21: 4 mg via INTRAVENOUS

## 2012-07-21 MED ORDER — ONDANSETRON HCL 4 MG PO TABS: 4.0000 mg | ORAL_TABLET | Freq: Four times a day (QID) | ORAL | Status: DC | PRN

## 2012-07-21 SURGICAL SUPPLY — 49 items
BAG ZIPLOCK 12X15 (MISCELLANEOUS) ×2 IMPLANT
BIT DRILL 2.8X128 (BIT) ×2 IMPLANT
BLADE EXTENDED COATED 6.5IN (ELECTRODE) ×2 IMPLANT
BLADE SAW SAG 73X25 THK (BLADE) ×1
BLADE SAW SGTL 73X25 THK (BLADE) ×1 IMPLANT
CLOTH BEACON ORANGE TIMEOUT ST (SAFETY) ×2 IMPLANT
DECANTER SPIKE VIAL GLASS SM (MISCELLANEOUS) ×2 IMPLANT
DRAPE INCISE IOBAN 66X45 STRL (DRAPES) ×4 IMPLANT
DRAPE ORTHO SPLIT 77X108 STRL (DRAPES) ×2
DRAPE POUCH INSTRU U-SHP 10X18 (DRAPES) ×2 IMPLANT
DRAPE SURG ORHT 6 SPLT 77X108 (DRAPES) ×2 IMPLANT
DRAPE U-SHAPE 47X51 STRL (DRAPES) ×2 IMPLANT
DRSG ADAPTIC 3X8 NADH LF (GAUZE/BANDAGES/DRESSINGS) ×2 IMPLANT
DRSG MEPILEX BORDER 4X4 (GAUZE/BANDAGES/DRESSINGS) ×4 IMPLANT
DRSG MEPILEX BORDER 4X8 (GAUZE/BANDAGES/DRESSINGS) ×2 IMPLANT
DURAPREP 26ML APPLICATOR (WOUND CARE) ×2 IMPLANT
ELECT REM PT RETURN 9FT ADLT (ELECTROSURGICAL) ×2
ELECTRODE REM PT RTRN 9FT ADLT (ELECTROSURGICAL) ×1 IMPLANT
EVACUATOR 1/8 PVC DRAIN (DRAIN) ×2 IMPLANT
FACESHIELD LNG OPTICON STERILE (SAFETY) ×8 IMPLANT
GLOVE BIO SURGEON STRL SZ8 (GLOVE) ×2 IMPLANT
GLOVE BIOGEL PI IND STRL 8 (GLOVE) ×2 IMPLANT
GLOVE BIOGEL PI INDICATOR 8 (GLOVE) ×2
GLOVE ECLIPSE 8.0 STRL XLNG CF (GLOVE) ×2 IMPLANT
GLOVE SURG SS PI 6.5 STRL IVOR (GLOVE) ×4 IMPLANT
GOWN STRL NON-REIN LRG LVL3 (GOWN DISPOSABLE) ×4 IMPLANT
GOWN STRL REIN XL XLG (GOWN DISPOSABLE) ×2 IMPLANT
IMMOBILIZER KNEE 20 (SOFTGOODS) ×2
IMMOBILIZER KNEE 20 THIGH 36 (SOFTGOODS) ×1 IMPLANT
KIT BASIN OR (CUSTOM PROCEDURE TRAY) ×2 IMPLANT
MANIFOLD NEPTUNE II (INSTRUMENTS) ×2 IMPLANT
NDL SAFETY ECLIPSE 18X1.5 (NEEDLE) ×1 IMPLANT
NEEDLE HYPO 18GX1.5 SHARP (NEEDLE) ×1
NS IRRIG 1000ML POUR BTL (IV SOLUTION) ×2 IMPLANT
PACK TOTAL JOINT (CUSTOM PROCEDURE TRAY) ×2 IMPLANT
PASSER SUT SWANSON 36MM LOOP (INSTRUMENTS) ×2 IMPLANT
POSITIONER SURGICAL ARM (MISCELLANEOUS) ×2 IMPLANT
SPONGE GAUZE 4X4 12PLY (GAUZE/BANDAGES/DRESSINGS) ×2 IMPLANT
STRIP CLOSURE SKIN 1/2X4 (GAUZE/BANDAGES/DRESSINGS) ×2 IMPLANT
SUT ETHIBOND NAB CT1 #1 30IN (SUTURE) ×6 IMPLANT
SUT MNCRL AB 4-0 PS2 18 (SUTURE) ×2 IMPLANT
SUT VIC AB 2-0 CT1 27 (SUTURE) ×3
SUT VIC AB 2-0 CT1 TAPERPNT 27 (SUTURE) ×3 IMPLANT
SUT VLOC 180 0 24IN GS25 (SUTURE) ×4 IMPLANT
SYR 50ML LL SCALE MARK (SYRINGE) ×2 IMPLANT
TOWEL OR 17X26 10 PK STRL BLUE (TOWEL DISPOSABLE) ×4 IMPLANT
TOWEL OR NON WOVEN STRL DISP B (DISPOSABLE) ×2 IMPLANT
TRAY FOLEY CATH 14FRSI W/METER (CATHETERS) ×2 IMPLANT
WATER STERILE IRR 1500ML POUR (IV SOLUTION) ×4 IMPLANT

## 2012-07-21 NOTE — H&P (View-Only) (Signed)
Monica [name]  DOB: 11-01-1955 Divorced / Language: English / Race: Undefined Female  Date of Admission:  07/21/12  Chief Complaint:  Right Hip Pain  History of Present Illness The patient is a 56 year old female who comes in for a preoperative History and Physical. The patient is scheduled for a right total hip arthroplasty to be performed by Dr. Gus Rankin. Aluisio, MD at [facility] on 07/21/2012. The patient is a 56 year old female who presents with a hip problem. The patient is here today changing care from Dr. Simonne Come.The patient reports right hip (worse than left) problems including pain symptoms that have been present for 6 month(s). The symptoms began without any known injury. Symptoms reported include hip pain, popping, difficulty flexing hip and difficulty rotating hip The patient reports symptoms radiating to the: right groin. The patient describes the hip problem as aching.The patient feels as if their symptoms are does feel they are worsening. Current treatment includes opioid analgesics. Prior to being seen today the patient was previously evaluated in this clinic. Previous treatment for this problem has included corticosteroid injection (did not help at all). The hip is hurting her at all times. Right side is much worse than the left. She saw Dr. Simonne Come in the spring and was noted to have osteonecrosis right worse than left hip. At that time the pain was more tolerable but now it is intolerable. 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 She is at a stage now where the hip has essentially taken over her life. She does not have a history of trauma, corticosteroid abuse or alcohol abuse. She does have a history of sickle cell trait. She was wondering of that may have contributed to the avascular necrosis. Regardless she is hurting at all times including at night. She is at a stage now where she feels like she  needs to do something about it. She wishes to proceed with surgery. They have been treated conservatively in the past for the above stated problem and despite conservative measures, they continue to have progressive pain and severe functional limitations and dysfunction. They have failed non-operative management including home exercise, medications. It is felt that they would benefit from undergoing total joint replacement. Risks and benefits of the procedure have been discussed with the patient and they elect to proceed with surgery. There are no active contraindications to surgery such as ongoing infection or rapidly progressive neurological disease. Please note, that at the time of her H&P, her ECHO results were pending that was ordered by Dr. Peter [place].   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 mother Osteoporosis. mother Diabetes Mellitus. mother Hypertension. mother   Social History Marital status. divorced Number of flights of stairs before winded. 2-3 Pain Contract. no Exercise. Exercises rarely; does running / walking Illicit drug use. no Living situation. live with partner Tobacco / smoke exposure. no Tobacco use. former smoker; smoke(d) less than 1/2 pack(s) per day Current work status. working full time Drug/Alcohol Rehab (Currently). no Drug/Alcohol Rehab (Previously). no Alcohol use. current drinker; only occasionally per week Children. 1 Post-Surgical Plans. Plan is to go home.   Medication History Enbrel (50MG /ML Solution, Subcutaneous) Active. TraMADol HCl (50MG  Tablet, Oral) Active. Vitamin D ( Oral) Specific dose unknown - Active. Tylenol ( Oral) Specific dose unknown - Active. CeleBREX ( Oral)  Specific dose unknown - Active. Hydrocodone-Acetaminophen (5-325MG  Tablet, Oral) Active. Xarelto ( Oral) Specific dose unknown - Active. Bystolic  ( Oral) Specific dose unknown - Active. Hydrochlorothiazide ( Oral) Specific dose unknown - Active.   Pregnancy / Birth History Pregnant. no   Past Surgical History No pertinent past surgical history  Medical History Autoimmune disorder High blood pressure Psoriasis Atrial Fib   Review of Systems General:Present- Fatigue and Tiredness. Not Present- Chills, Fever, Night Sweats, Weight Gain, Weight Loss and Memory Loss. Skin:Not Present- Hives, Itching, Rash, Eczema and Lesions. HEENT:Not Present- Tinnitus, Headache, Double Vision, Visual Loss, Hearing Loss and Dentures. Respiratory:Present- Shortness of breath with exertion. Not Present- Shortness of breath at rest, Allergies, Coughing up blood and Chronic Cough. Cardiovascular:Present- Irregular Heart Beat and Rapid Heart Rate (was found to be in Atrial Fib recently). Not Present- Chest Pain, Racing/skipping heartbeats, Difficulty Breathing Lying Down, Murmur, Swelling and Palpitations. Gastrointestinal:Not Present- Bloody Stool, Heartburn, Abdominal Pain, Vomiting, Nausea, Constipation, Diarrhea, Difficulty Swallowing, Jaundice and Loss of appetitie. Female Genitourinary:Not Present- Blood in Urine, Urinary frequency, Weak urinary stream, Discharge, Flank Pain, Incontinence, Painful Urination, Urgency, Urinary Retention and Urinating at Night. Musculoskeletal:Present- Joint Pain. Not Present- Muscle Weakness, Muscle Pain, Joint Swelling, Back Pain, Morning Stiffness and Spasms. Neurological:Not Present- Tremor, Dizziness, Blackout spells, Paralysis, Difficulty with balance and Weakness. Psychiatric:Not Present- Insomnia.   Vitals Pulse: 68 (Regular) Resp.: 12 (Unlabored) BP: 148/92 (Sitting, Left Arm, Standard)    Physical Exam The physical exam findings are as follows:  Note: Patient is a 56 year old female with continued hip pain.   General Mental Status - Alert, cooperative and good  historian. General Appearance- pleasant. Not in acute distress. Orientation- Oriented X3. Build & Nutrition- Well nourished and Well developed.   Head and Neck Head- normocephalic, atraumatic . Neck Global Assessment- supple. no bruit auscultated on the right and no bruit auscultated on the left.   Eye Pupil- Bilateral- Regular and Round. Motion- Bilateral- EOMI.   Chest and Lung Exam Auscultation: Breath sounds:- clear at anterior chest wall and - clear at posterior chest wall. Adventitious sounds:- No Adventitious sounds.   Cardiovascular Auscultation:Rhythm- Regular rate and rhythm. Heart Sounds- S1 WNL and S2 WNL. Murmurs & Other Heart Sounds:Auscultation of the heart reveals - No Murmurs.   Abdomen Palpation/Percussion:Tenderness- Abdomen is non-tender to palpation. Rigidity (guarding)- Abdomen is soft. Auscultation:Auscultation of the abdomen reveals - Bowel sounds normal.   Female Genitourinary  Not done, not pertinent to present illness  Musculoskeletal Very pleasant, well developed female alert and oriented in no apparent distress. The left hip flexion 120, rotate in 30, out 40, abduct 40 without discomfort. Right hip flexion 110, rotation in 20, out 30, abduction 30 with discomfort. There is no popping or catching on range of motion.  RADIOGRAPHS: Radiographs taken today AP pelvis and lateral of both hips show some subchondral sclerosis in the left hip with no collapse. Right hip has a large area of collapse and subchondral cyst formation with subchondral sclerosis also. This has progressed significantly from her x-rays in the spring.  Assessment & Plan Osteoarthritis, Hip (715.35) Impression: Right Hip  Note: Plan is for a Right Total Hip Replacement by Dr. Lequita Halt  Plan is to go home.  Cardiology - Dr. Peter [place] - Please note, that at the time of her H&P, her ECHO results were pending that was ordered by Dr. Peter  [place].  Avel Peace, PA-C

## 2012-07-21 NOTE — Preoperative (Signed)
Beta Blockers   Reason not to administer Beta Blockers:Took Bystolic this am. 

## 2012-07-21 NOTE — H&P (Signed)
Monica [name]  DOB: 11/03/1955 Divorced / Language: English / Race: Undefined Female  Date of Admission:  07/21/12  Chief Complaint:  Right Hip Pain  History of Present Illness The patient is a 56 year old female who comes in for a preoperative History and Physical. The patient is scheduled for a right total hip arthroplasty to be performed by Dr. Frank V. Aluisio, MD at [facility] on 07/21/2012. The patient is a 56 year old female who presents with a hip problem. The patient is here today changing care from Dr. Aplington.The patient reports right hip (worse than left) problems including pain symptoms that have been present for 6 month(s). The symptoms began without any known injury. Symptoms reported include hip pain, popping, difficulty flexing hip and difficulty rotating hip The patient reports symptoms radiating to the: right groin. The patient describes the hip problem as aching.The patient feels as if their symptoms are does feel they are worsening. Current treatment includes opioid analgesics. Prior to being seen today the patient was previously evaluated in this clinic. Previous treatment for this problem has included corticosteroid injection (did not help at all). The hip is hurting her at all times. Right side is much worse than the left. She saw Dr. Aplington in the spring and was noted to have osteonecrosis right worse than left hip. At that time the pain was more tolerable but now it is intolerable. 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 She is at a stage now where the hip has essentially taken over her life. She does not have a history of trauma, corticosteroid abuse or alcohol abuse. She does have a history of sickle cell trait. She was wondering of that may have contributed to the avascular necrosis. Regardless she is hurting at all times including at night. She is at a stage now where she feels like she  needs to do something about it. She wishes to proceed with surgery. They have been treated conservatively in the past for the above stated problem and despite conservative measures, they continue to have progressive pain and severe functional limitations and dysfunction. They have failed non-operative management including home exercise, medications. It is felt that they would benefit from undergoing total joint replacement. Risks and benefits of the procedure have been discussed with the patient and they elect to proceed with surgery. There are no active contraindications to surgery such as ongoing infection or rapidly progressive neurological disease. Please note, that at the time of her H&P, her ECHO results were pending that was ordered by Dr. Peter Jordan.   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 mother Osteoporosis. mother Diabetes Mellitus. mother Hypertension. mother   Social History Marital status. divorced Number of flights of stairs before winded. 2-3 Pain Contract. no Exercise. Exercises rarely; does running / walking Illicit drug use. no Living situation. live with partner Tobacco / smoke exposure. no Tobacco use. former smoker; smoke(d) less than 1/2 pack(s) per day Current work status. working full time Drug/Alcohol Rehab (Currently). no Drug/Alcohol Rehab (Previously). no Alcohol use. current drinker; only occasionally per week Children. 1 Post-Surgical Plans. Plan is to go home.   Medication History Enbrel (50MG/ML Solution, Subcutaneous) Active. TraMADol HCl (50MG Tablet, Oral) Active. Vitamin D ( Oral) Specific dose unknown - Active. Tylenol ( Oral) Specific dose unknown - Active. CeleBREX ( Oral)   Specific dose unknown - Active. Hydrocodone-Acetaminophen (5-325MG Tablet, Oral) Active. Xarelto ( Oral) Specific dose unknown - Active. Bystolic  ( Oral) Specific dose unknown - Active. Hydrochlorothiazide ( Oral) Specific dose unknown - Active.   Pregnancy / Birth History Pregnant. no   Past Surgical History No pertinent past surgical history  Medical History Autoimmune disorder High blood pressure Psoriasis Atrial Fib   Review of Systems General:Present- Fatigue and Tiredness. Not Present- Chills, Fever, Night Sweats, Weight Gain, Weight Loss and Memory Loss. Skin:Not Present- Hives, Itching, Rash, Eczema and Lesions. HEENT:Not Present- Tinnitus, Headache, Double Vision, Visual Loss, Hearing Loss and Dentures. Respiratory:Present- Shortness of breath with exertion. Not Present- Shortness of breath at rest, Allergies, Coughing up blood and Chronic Cough. Cardiovascular:Present- Irregular Heart Beat and Rapid Heart Rate (was found to be in Atrial Fib recently). Not Present- Chest Pain, Racing/skipping heartbeats, Difficulty Breathing Lying Down, Murmur, Swelling and Palpitations. Gastrointestinal:Not Present- Bloody Stool, Heartburn, Abdominal Pain, Vomiting, Nausea, Constipation, Diarrhea, Difficulty Swallowing, Jaundice and Loss of appetitie. Female Genitourinary:Not Present- Blood in Urine, Urinary frequency, Weak urinary stream, Discharge, Flank Pain, Incontinence, Painful Urination, Urgency, Urinary Retention and Urinating at Night. Musculoskeletal:Present- Joint Pain. Not Present- Muscle Weakness, Muscle Pain, Joint Swelling, Back Pain, Morning Stiffness and Spasms. Neurological:Not Present- Tremor, Dizziness, Blackout spells, Paralysis, Difficulty with balance and Weakness. Psychiatric:Not Present- Insomnia.   Vitals Pulse: 68 (Regular) Resp.: 12 (Unlabored) BP: 148/92 (Sitting, Left Arm, Standard)    Physical Exam The physical exam findings are as follows:  Note: Patient is a 56 year old female with continued hip pain.   General Mental Status - Alert, cooperative and good  historian. General Appearance- pleasant. Not in acute distress. Orientation- Oriented X3. Build & Nutrition- Well nourished and Well developed.   Head and Neck Head- normocephalic, atraumatic . Neck Global Assessment- supple. no bruit auscultated on the right and no bruit auscultated on the left.   Eye Pupil- Bilateral- Regular and Round. Motion- Bilateral- EOMI.   Chest and Lung Exam Auscultation: Breath sounds:- clear at anterior chest wall and - clear at posterior chest wall. Adventitious sounds:- No Adventitious sounds.   Cardiovascular Auscultation:Rhythm- Regular rate and rhythm. Heart Sounds- S1 WNL and S2 WNL. Murmurs & Other Heart Sounds:Auscultation of the heart reveals - No Murmurs.   Abdomen Palpation/Percussion:Tenderness- Abdomen is non-tender to palpation. Rigidity (guarding)- Abdomen is soft. Auscultation:Auscultation of the abdomen reveals - Bowel sounds normal.   Female Genitourinary  Not done, not pertinent to present illness  Musculoskeletal Very pleasant, well developed female alert and oriented in no apparent distress. The left hip flexion 120, rotate in 30, out 40, abduct 40 without discomfort. Right hip flexion 110, rotation in 20, out 30, abduction 30 with discomfort. There is no popping or catching on range of motion.  RADIOGRAPHS: Radiographs taken today AP pelvis and lateral of both hips show some subchondral sclerosis in the left hip with no collapse. Right hip has a large area of collapse and subchondral cyst formation with subchondral sclerosis also. This has progressed significantly from her x-rays in the spring.  Assessment & Plan Osteoarthritis, Hip (715.35) Impression: Right Hip  Note: Plan is for a Right Total Hip Replacement by Dr. Aluisio  Plan is to go home.  Cardiology - Dr. Peter Jordan - Please note, that at the time of her H&P, her ECHO results were pending that was ordered by Dr. Peter  Jordan.  Drew Perkins, PA-C  

## 2012-07-21 NOTE — Interval H&P Note (Signed)
History and Physical Interval Note:  07/21/2012 9:35 AM  Monica [name]  has presented today for surgery, with the diagnosis of avacular necrosis right hip  The various methods of treatment have been discussed with the patient and family. After consideration of risks, benefits and other options for treatment, the patient has consented to  Procedure(s) (LRB) with comments: TOTAL HIP ARTHROPLASTY (Right) as a surgical intervention .  The patient's history has been reviewed, patient examined, no change in status, stable for surgery.  I have reviewed the patient's chart and labs.  Questions were answered to the patient's satisfaction.     Loanne Drilling

## 2012-07-21 NOTE — Progress Notes (Signed)
Patient up dangled legs at the side of the bed for about . One assist sitting up but needed 2 person assist getting back in bed due to patient position. With the movement noted blood stain on the dressing and pad, reinforced dressing and reported off to night shift RN for F/U assessment.

## 2012-07-21 NOTE — Op Note (Signed)
Pre-operative diagnosis- AVN Right hip  Post-operative diagnosis- AVN  Right hip  Procedure-  RightTotal Hip Arthroplasty  Surgeon- Gus Rankin. Patti Shorb, MD  Assistant- Dimitri Ped, PA-C   Anesthesia  General  EBL- 400   Drain Hemovac   Complication- None  Condition-PACU - hemodynamically stable.   Brief Clinical Note-  Monica [name] is a 56 y.o. 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 Pain occurs with activity and rest including pain at night. She has tried analgesics, protected weight bearing and rest without benefit. Pain is too severe to attempt physical therapy. Radiographs demonstrate femoral head collapse with subchondral cyst formation. She presents now for right THA.  Procedure in detail-   The patient is brought into the operating room and placed on the operating table. After successful administration of General  anesthesia, the patient is placed in the  Left lateral decubitus position with the  Right side up and held in place with the hip positioner. The lower extremity is isolated from the perineum with plastic drapes and time-out is performed by the surgical team. The lower extremity is then prepped and draped in the usual sterile fashion. A short posterolateral incision is made with a ten blade through the subcutaneous tissue to the level of the fascia lata which is incised in line with the skin incision. The sciatic nerve is palpated and protected and the short external rotators and capsule are isolated from the femur. The hip is then dislocated and the center of the femoral head is marked. A trial prosthesis is placed such that the trial head corresponds to the center of the patients' native femoral head. The resection level is marked on the femoral neck and the resection is made with an oscillating saw. The femoral head is removed and femoral retractors placed to gain access to the femoral canal.      The canal finder  is passed into the femoral canal and the canal is thoroughly irrigated with sterile saline to remove the fatty contents. Axial reaming is performed to 11.5  mm, proximal reaming to 40F  and the sleeve machined to a small. A 16 F small trial sleeve is placed into the proximal femur.      The femur is then retracted anteriorly to gain acetabular exposure. Acetabular retractors are placed and the labrum and osteophytes are removed, Acetabular reaming is performed to 49  mm and a 50  mm Pinnacle acetabular shell is placed in anatomic position with excellent purchase. Additional dome screws were not needed. The permanent 32 mm neutral + 4 Marathon liner is placed into the acetabular shell.      The trial femur is then placed into the femoral canal. The size is 16 x 11  stem with a 36 + 6  neck and a 32 +3 head with the neck version 10 degrees beyond  the patients' native anteversion. The hip is reduced with excellent stability with full extension and full external rotation, 70 degrees flexion with 40 degrees adduction and 90 degrees internal rotation and 90 degrees of flexion with 70 degrees of internal rotation. The operative leg is placed on top of the non-operative leg and the leg lengths are found to be equal. The trials are then removed and the permanent implant of the same size is impacted into the femoral canal. The ceramic femoral head of the same size as the trial is placed and the hip is reduced with the same stability  parameters. The operative leg is again placed on top of the non-operative leg and the leg lengths are found to be equal.      The wound is then copiously irrigated with saline solution and the capsule and short external rotators are re-attached to the femur through drill holes with Ethibond suture. The fascia lata is closed over a hemovac drain with #1 vicryl suture and the fascia lata, gluteal muscles and subcutaneous tissues are injected with Exparel 20ml diluted with saline 50ml. The  subcutaneous tissues are closed with #1 and2-0 vicryl and the subcuticular layer closed with running 4-0 Monocryl. The drain is hooked to suction, incision cleaned and dried, and steri-srips and a bulky sterile dressing applied. The limb is placed into a knee immobilizer and the patient is awakened and transported to recovery in stable condition.      Please note that a surgical assistant was a medical necessity for this procedure in order to perform it in a safe and expeditious manner. The assistant was necessary to provide retraction to the vital neurovascular structures and to retract and position the limb to allow for anatomic placement of the prosthetic components.  Gus Rankin Latha Staunton, MD    07/21/2012, 11:10 AM

## 2012-07-21 NOTE — Progress Notes (Signed)
PT Cancellation Note  Patient Details Name: Monica [name] MRN: [id number] DOB: Jul 11, 1956   Cancelled Treatment:    Reason Eval/Treat Not Completed: Medical issues which prohibited therapy (deferred PT eval due to elevated BP 194/97 )   Drucilla Chalet 07/21/2012, 6:12 PM

## 2012-07-21 NOTE — Transfer of Care (Signed)
Immediate Anesthesia Transfer of Care Note  Patient: Monica [name]  Procedure(s) Performed: Procedure(s) (LRB) with comments: TOTAL HIP ARTHROPLASTY (Right)  Patient Location: PACU  Anesthesia Type:General  Level of Consciousness: awake and sedated  Airway & Oxygen Therapy: Patient Spontanous Breathing and Patient connected to face mask oxygen  Post-op Assessment: Report given to PACU RN and Post -op Vital signs reviewed and stable  Post vital signs: Reviewed and stable  Complications: No apparent anesthesia complications

## 2012-07-21 NOTE — Care Management Note (Signed)
    Page 1 of 2   07/24/2012     11:18:51 AM   CARE MANAGEMENT NOTE 07/24/2012  Patient:  Monica Monica [name], Monica Monica [name]   Account Number:  [id number]  Date Initiated:  07/21/2012  Documentation initiated by:  Monica Monica [name]  Subjective/Objective Assessment:   ADMITTED W/R HIP PAIN.AVN R HIP.     Action/Plan:   FROM HOME W/SPOUSE.HAS PCP,PHARMACY.   Anticipated DC Date:  07/24/2012   Anticipated DC Plan:  HOME W HOME HEALTH SERVICES  In-house referral  Clinical Social Worker      DC Planning Services  CM consult      [facility] Choice  HOME HEALTH   Choice offered to / List presented to:  C-1 Patient   DME arranged  3-N-1  WALKER - WIDE      DME agency  [facility]     HH arranged  HH-2 PT      [facility] agency  [facility]   Status of service:  Completed, signed off Medicare Important Message given?  NO (If response is "NO", the following Medicare IM given date fields will be blank) Date Medicare IM given:   Date Additional Medicare IM given:    Discharge Disposition:  HOME W HOME HEALTH SERVICES  Per UR Regulation:  Reviewed for med. necessity/level of care/duration of stay  If discussed at Long Length of Stay Meetings, dates discussed:    Comments:  07/24/2012 Monica Monica [name] bsn rn ccm [phone] Pt for discharge today. [facility] will provide [facility] services with start date tomorrow 07/25/2012. DME will be delivered to patient's room- 3n1, RW.   07/23/12  1010a  Spoke w/ pt in room 1613.  Discussed HHC and agencies, pt stated that she had Monica [name] list of HHC agencies but usure of where it was.  Provided pt w/ another list and she chose [facility].  Pt stated that she would need Monica [name] cane, walker, 3 in 1 and Monica [name] shower chair.  Pt will also need home PT.  Pt stated that she would also like to have the assistive device that would allow her to put her socks and shoes on.  Pt lives at home and has Monica [name] half bath down stairs.  Her full bath - including shower is  located on the 2nd floor of her home.  Left CM name and number for today and also CM's name for Monday - day of discharge. Explained that equipment would be delivered to the pts room prior to dc.  CM unsure if cane and walker will be covered by insurance.  [facility] will be able to tell her what equipment would be covered.  Questions answered.  TJohnson, RNBSN [phone]    07/21/12 Monica MAHABIR RN,BSN NCM [phone] S/P R THA.NOTED HHPT ORDER PLACED.PROVIDED Monica Monica [name] AGENCY LIST.

## 2012-07-21 NOTE — Anesthesia Postprocedure Evaluation (Signed)
  Anesthesia Post-op Note  Patient: Monica [name]  Procedure(s) Performed: Procedure(s) (LRB): TOTAL HIP ARTHROPLASTY (Right)  Patient Location: PACU  Anesthesia Type: General  Level of Consciousness: awake and alert   Airway and Oxygen Therapy: Patient Spontanous Breathing  Post-op Pain: mild  Post-op Assessment: Post-op Vital signs reviewed, Patient's Cardiovascular Status Stable, Respiratory Function Stable, Patent Airway and No signs of Nausea or vomiting  Last Vitals:  Filed Vitals:   07/21/12 1215  BP: 163/96  Pulse:   Temp:   Resp:     Post-op Vital Signs: stable   Complications: No apparent anesthesia complications

## 2012-07-21 NOTE — Anesthesia Preprocedure Evaluation (Addendum)
Anesthesia Evaluation  Patient identified by MRN, date of birth, ID band Patient awake    Reviewed: Allergy & Precautions, H&P , NPO status , Patient's Chart, lab work & pertinent test results, reviewed documented beta blocker date and time   Airway Mallampati: II TM Distance: >3 FB Neck ROM: full    Dental No notable dental hx. (+) Dental Advisory Given and Missing,    Pulmonary neg pulmonary ROS,  breath sounds clear to auscultation  Pulmonary exam normal       Cardiovascular hypertension, Pt. on home beta blockers + dysrhythmias Atrial Fibrillation Rhythm:Irregular Rate:Normal  ECG - AF   Neuro/Psych negative neurological ROS  negative psych ROS   GI/Hepatic negative GI ROS, Neg liver ROS,   Endo/Other  negative endocrine ROSMorbid obesity  Renal/GU negative Renal ROS  negative genitourinary   Musculoskeletal   Abdominal (+) + obese,   Peds  Hematology negative hematology ROS (+)   Anesthesia Other Findings   Reproductive/Obstetrics negative OB ROS                          Anesthesia Physical Anesthesia Plan  ASA: III  Anesthesia Plan: General   Post-op Pain Management:    Induction: Intravenous  Airway Management Planned: Oral ETT  Additional Equipment:   Intra-op Plan:   Post-operative Plan: Extubation in OR  Informed Consent: I have reviewed the patients History and Physical, chart, labs and discussed the procedure including the risks, benefits and alternatives for the proposed anesthesia with the patient or authorized representative who has indicated his/her understanding and acceptance.   Dental Advisory Given  Plan Discussed with: CRNA and Surgeon  Anesthesia Plan Comments:         Anesthesia Quick Evaluation

## 2012-07-22 ENCOUNTER — Encounter (HOSPITAL_COMMUNITY): Payer: Self-pay | Admitting: General Practice

## 2012-07-22 LAB — CBC
HCT: 30.8 % — ABNORMAL LOW (ref 36.0–46.0)
MCH: 29.1 pg (ref 26.0–34.0)
MCV: 87 fL (ref 78.0–100.0)
Platelets: 381 10*3/uL (ref 150–400)
RBC: 3.54 MIL/uL — ABNORMAL LOW (ref 3.87–5.11)
RDW: 13.4 % (ref 11.5–15.5)

## 2012-07-22 LAB — BASIC METABOLIC PANEL
BUN: 7 mg/dL (ref 6–23)
CO2: 25 mEq/L (ref 19–32)
Calcium: 8.3 mg/dL — ABNORMAL LOW (ref 8.4–10.5)
Creatinine, Ser: 0.56 mg/dL (ref 0.50–1.10)

## 2012-07-22 MED ORDER — PANTOPRAZOLE SODIUM 20 MG PO TBEC
20.0000 mg | DELAYED_RELEASE_TABLET | Freq: Two times a day (BID) | ORAL | Status: DC
  Administered 2012-07-22 (×2): 20 mg via ORAL
  Filled 2012-07-22 (×2): qty 1

## 2012-07-22 MED ORDER — PANTOPRAZOLE SODIUM 20 MG PO TBEC
20.0000 mg | DELAYED_RELEASE_TABLET | Freq: Two times a day (BID) | ORAL | Status: DC
  Administered 2012-07-23 – 2012-07-24 (×3): 20 mg via ORAL
  Filled 2012-07-22 (×6): qty 1

## 2012-07-22 NOTE — Progress Notes (Signed)
   Subjective: 1 Day Post-Op Procedure(s) (LRB): TOTAL HIP ARTHROPLASTY (Right) Patient reports pain as mild and moderate.   Patient seen in rounds with Dr. Lequita Halt.  Family in room at bedside. Patient is well, but has had some minor complaints of pain in the hip and thigh, requiring pain medications We will start therapy today.  Plan is to go Home after hospital stay.  Objective: Vital signs in last 24 hours: Temp:  [97.5 F (36.4 C)-98.6 F (37 C)] 98.1 F (36.7 C) (12/07 0455) Pulse Rate:  [79-105] 96  (12/07 0455) Resp:  [8-20] 18  (12/07 0455) BP: (115-194)/(67-101) 122/88 mmHg (12/07 0455) SpO2:  [96 %-99 %] 98 % (12/07 0455) Weight:  [118.8 kg (261 lb 14.5 oz)] 118.8 kg (261 lb 14.5 oz) (12/06 1812)  Intake/Output from previous day:  Intake/Output Summary (Last 24 hours) at 07/22/12 0825 Last data filed at 07/22/12 0813  Gross per 24 hour  Intake 2992.5 ml  Output   3160 ml  Net -167.5 ml    Intake/Output this shift: Total I/O In: 120 [P.O.:120] Out: -   Labs:  Basename 07/22/12 0600  HGB 10.3*    Basename 07/22/12 0600  WBC 11.5*  RBC 3.54*  HCT 30.8*  PLT 381    Basename 07/22/12 0600  NA 135  K 3.6  CL 99  CO2 25  BUN 7  CREATININE 0.56  GLUCOSE 147*  CALCIUM 8.3*   No results found for this basename: LABPT:2,INR:2 in the last 72 hours  EXAM General - Patient is Alert, Appropriate and Oriented Extremity - Neurovascular intact Sensation intact distally Dorsiflexion/Plantar flexion intact Dressing - dressing C/D/I Motor Function - intact, moving foot and toes well on exam.  Hemovac pulled without difficulty.  Past Medical History  Diagnosis Date  . Atrial fibrillation   . Right hip pain   . Hypertension   . Morbid obesity   . Psoriasis     active breakout left buttocks    Assessment/Plan: 1 Day Post-Op Procedure(s) (LRB): TOTAL HIP ARTHROPLASTY (Right) Principal Problem:  *Avascular necrosis of hip  Estimated Body mass  index is 43.58 kg/(m^2) as calculated from the following:   Height as of this encounter: 5\' 5" (1.651 m).   Weight as of this encounter: 261 lb 14.5 oz(118.8 kg). Advance diet Up with therapy Plan for discharge tomorrow or Monday depending upon progress Discharge home with home health  DVT Prophylaxis - Xarelto Weight Bearing As Tolerated right Leg D/C Knee Immobilizer Hemovac Pulled Begin Therapy Hip Preacutions No vaccines. DC Tele   Haylin Camilli 07/22/2012, 8:25 AM

## 2012-07-22 NOTE — Progress Notes (Signed)
Pt sat up on side of bed per pt request for half hour with no complaints or complications. Pt then requested to lay back in bed. Required 3 assist. Pt now laying comfortably in bed, O2 and HR stable. Given morphine for pain. Will continue to monitor. - Christell Faith, RN

## 2012-07-22 NOTE — Progress Notes (Signed)
   07/22/12 1700  PT Visit Information  Last PT Received On 07/22/12  Assistance Needed +1  PT Time Calculation  PT Start Time 1642  PT Stop Time 1706  PT Time Calculation (min) 24 min  Subjective Data  Subjective I feel good  Patient Stated Goal home  Precautions  Precautions Posterior Hip  Precaution Comments sign in room  Restrictions  RLE Weight Bearing WBAT  Cognition  Overall Cognitive Status Appears within functional limits for tasks assessed/performed  Arousal/Alertness Awake/alert  Orientation Level Appears intact for tasks assessed  Behavior During Session [facility] for tasks performed  Bed Mobility  Bed Mobility Not assessed  Transfers  Transfers Sit to Stand;Stand to Sit  Sit to Stand 4: Min guard;4: Min assist;From chair/3-in-1  Stand to Sit 4: Min guard;4: Min assist;To chair/3-in-1  Details for Transfer Assistance cues for hand placement and THP; demonstrated improved control of descent this pm  Ambulation/Gait  Ambulation/Gait Assistance 4: Min guard;5: Supervision  Ambulation Distance (Feet) 120 Feet (15')  Assistive device Rolling walker  Ambulation/Gait Assistance Details cues for sequence and RW distance from self  Gait Pattern Step-to pattern;Antalgic;Trendelenburg (slight Trendelenburg)  Total Joint Exercises  Ankle Circles/Pumps AROM;Both;10 reps  Quad Sets AROM;Both;10 reps  Heel Slides AAROM;Right;10 reps  Hip ABduction/ADduction AAROM;Right;10 reps  PT - End of Session  Activity Tolerance Patient tolerated treatment well  Patient left in chair;with call bell/phone within reach;with family/visitor present  PT - Assessment/Plan  Comments on Treatment Session pt progressing well  PT Plan Discharge plan remains appropriate;Frequency remains appropriate  PT Frequency 7X/week  Follow Up Recommendations Home health PT  Equipment Recommended Rolling walker with 5" wheels;3 in 1 bedside comode (WIDE RW)  Acute Rehab PT Goals  Time For Goal Achievement  07/27/12  Potential to Achieve Goals Good  Pt will go Sit to Stand with supervision  PT Goal: Sit to Stand - Progress Progressing toward goal  Pt will go Stand to Sit with supervision  PT Goal: Stand to Sit - Progress Progressing toward goal  Pt will Ambulate 51 - 150 feet;with supervision;with rolling walker  PT Goal: Ambulate - Progress Progressing toward goal  Pt will Perform Home Exercise Program with supervision, verbal cues required/provided  PT Goal: Perform Home Exercise Program - Progress Progressing toward goal  PT General Charges  $$ ACUTE PT VISIT 1 Procedure  PT Treatments  $Gait Training 23-37 mins

## 2012-07-22 NOTE — Evaluation (Signed)
Occupational Therapy Evaluation Patient Details Name: LUPITA ROSALES MRN: [id number] DOB: Aug 06, 1956 Today's Date: 07/22/2012 Time: 9147-8295 OT Time Calculation (min): 27 min  OT Assessment / Plan / Recommendation Clinical Impression  Pt is s/p R THA and displays increased pain, decreased strength and overall decreased independence with self care and functional mobility. She will benefit from skilled OT services to improve ADL independence for d/c home.    OT Assessment  Patient needs continued OT Services    Follow Up Recommendations  Home health OT;Supervision/Assistance - 24 hour    Barriers to Discharge      Equipment Recommendations  Rolling walker with 5" wheels;3 in 1 bedside comode    Recommendations for Other Services    Frequency  Min 2X/week    Precautions / Restrictions Precautions Precautions: Posterior Hip Precaution Comments: sign in room Restrictions RLE Weight Bearing: Weight bearing as tolerated        ADL  Eating/Feeding: Simulated;Independent Where Assessed - Eating/Feeding: Chair Grooming: Simulated;Wash/dry hands;Set up Where Assessed - Grooming: Supported sitting Upper Body Bathing: Simulated;Chest;Right arm;Left arm;Abdomen;Set up Where Assessed - Upper Body Bathing: Supported sitting Lower Body Bathing: Simulated;Moderate assistance Where Assessed - Lower Body Bathing: Supported sit to stand Upper Body Dressing: Simulated;Set up Where Assessed - Upper Body Dressing: Unsupported sitting Lower Body Dressing: Simulated;Maximal assistance;Other (comment) (without AE) Where Assessed - Lower Body Dressing: Supported sit to Pharmacist, hospital: Performed;Minimal Dentist Method: Surveyor, minerals: Bedside commode;Other (comment) (BSC back to chair) Toileting - Clothing Manipulation and Hygiene: Simulated;Minimal assistance Where Assessed - Toileting Clothing Manipulation and Hygiene: Standing Tub/Shower  Transfer Method: Not assessed Equipment Used: Rolling walker;Long-handled shoe horn;Long-handled sponge;Reacher;Sock aid ADL Comments: Demonstrated all AE to pt/family. Pt interested in obtaining AE and practicing with it. Pt has a friend who she plans to have assist with self care tasks at home. Reviewed all hip precautions with pt and she could state 2/3 on her own prior to review    OT Diagnosis: Generalized weakness;Acute pain  OT Problem List: Decreased strength;Decreased knowledge of use of DME or AE;Pain;Decreased knowledge of precautions OT Treatment Interventions: Self-care/ADL training;Therapeutic activities;DME and/or AE instruction;Patient/family education   OT Goals Acute Rehab OT Goals OT Goal Formulation: With patient Time For Goal Achievement: 07/29/12 Potential to Achieve Goals: Good ADL Goals Pt Will Perform Grooming: with supervision;Standing at sink ADL Goal: Grooming - Progress: Goal set today Pt Will Perform Lower Body Bathing: with supervision;Sit to stand from chair;Sit to stand from bed;with adaptive equipment ADL Goal: Lower Body Bathing - Progress: Goal set today Pt Will Perform Lower Body Dressing: with supervision;Sit to stand from chair;Sit to stand from bed;with adaptive equipment ADL Goal: Lower Body Dressing - Progress: Goal set today Pt Will Transfer to Toilet: with supervision;Ambulation;with DME;3-in-1 ADL Goal: Toilet Transfer - Progress: Goal set today Pt Will Perform Toileting - Clothing Manipulation: with supervision;Standing ADL Goal: Toileting - Clothing Manipulation - Progress: Goal set today Additional ADL Goal #1: Pt will state 3/3 hip precautions independently to adhere to during all ADL. ADL Goal: Additional Goal #1 - Progress: Goal set today  Visit Information  Last OT Received On: 07/22/12 Assistance Needed: +1    Subjective Data  Subjective: I feel like I need to go to the bathroom but I cant Patient Stated Goal: home when ready    Prior Functioning     Home Living Lives With: Significant other Type of Home: House Home Access: Level entry Home Layout: Two level;Able to  live on main level with bedroom/bathroom (half bath downstairs) Bathroom Shower/Tub: Tub/shower unit (upstairs) Bathroom Toilet: Standard Home Adaptive Equipment: Reacher Prior Function Level of Independence: Independent Communication Communication: No difficulties         Vision/Perception     Cognition  Overall Cognitive Status: Appears within functional limits for tasks assessed/performed Arousal/Alertness: Awake/alert Orientation Level: Appears intact for tasks assessed Behavior During Session: [facility] for tasks performed    Extremity/Trunk Assessment Right Upper Extremity Assessment RUE ROM/Strength/Tone: [facility] for tasks assessed Left Upper Extremity Assessment LUE ROM/Strength/Tone: WFL for tasks assessed Right Lower Extremity Assessment RLE ROM/Strength/Tone: Deficits;Due to pain RLE ROM/Strength/Tone Deficits: hip and knee grossly 2+/5; ankle WFL Left Lower Extremity Assessment LLE ROM/Strength/Tone: WFL for tasks assessed     Mobility Bed Mobility Bed Mobility: Not assessed Details for Bed Mobility Assistance: to [facility] with RN/ on BSC upon PT arrival Transfers Transfers: Sit to Stand;Stand to Sit Sit to Stand: 4: Min assist;With upper extremity assist;From chair/3-in-1 Stand to Sit: 4: Min assist;With upper extremity assist;To chair/3-in-1 Details for Transfer Assistance: cues for hand placement and R LE management for THPs. Did good with controlling descent by reaching back for chair after instructed to do so.           Balance     End of Session OT - End of Session Activity Tolerance: Patient tolerated treatment well Patient left: in chair;with call bell/phone within reach;with family/visitor present  GO     Lennox Laity [phone] 07/22/2012, 12:19 PM

## 2012-07-22 NOTE — Evaluation (Signed)
Physical Therapy Evaluation Patient Details Name: Monica [name] MRN: [id number] DOB: 1955-12-17 Today's Date: 07/22/2012 Time: 1478-2956 PT Time Calculation (min): 31 min  PT Assessment / Plan / Recommendation Clinical Impression  pt is s/p right posterior THA and will benefit form PT to maximize independence for home setting with family assist    PT Assessment  Patient needs continued PT services    Follow Up Recommendations  Home health PT    Does the patient have the potential to tolerate intense rehabilitation      Barriers to Discharge        Equipment Recommendations  Rolling walker with 5" wheels    Recommendations for Other Services     Frequency 7X/week    Precautions / Restrictions Precautions Precautions: Posterior Hip Precaution Comments: sign in room Restrictions RLE Weight Bearing: Weight bearing as tolerated   Pertinent Vitals/Pain C/o right hip pain after amb, 8/10 and RN notified      Mobility  Bed Mobility Bed Mobility: Not assessed Details for Bed Mobility Assistance: to [facility] with RN/ on BSC upon PT arrival Transfers Transfers: Sit to Stand;Stand to Sit Sit to Stand: 4: Min assist;3: Mod assist Stand to Sit: 3: Mod assist;4: Min assist Details for Transfer Assistance: cues for hand placement and THP; assist to control descent as pt fell into chair and states this is what she does at home; Ambulation/Gait Ambulation/Gait Assistance: 4: Min assist Ambulation Distance (Feet): 65 Feet Assistive device: Rolling walker Ambulation/Gait Assistance Details: cues for sequence and RW distance from self Gait Pattern: Step-to pattern;Antalgic Gait velocity: decreased    Shoulder Instructions     Exercises Total Joint Exercises Ankle Circles/Pumps: AROM;Both;10 reps Quad Sets: AROM;Both;5 reps   PT Diagnosis: Difficulty walking  PT Problem List: Decreased strength;Decreased range of motion;Decreased activity tolerance;Decreased balance;Decreased  mobility;Decreased knowledge of precautions;Decreased knowledge of use of DME PT Treatment Interventions: DME instruction;Gait training;Functional mobility training;Therapeutic activities;Therapeutic exercise;Patient/family education   PT Goals Acute Rehab PT Goals PT Goal Formulation: With patient Time For Goal Achievement: 07/27/12 Potential to Achieve Goals: Good Pt will go Supine/Side to Sit: with supervision PT Goal: Supine/Side to Sit - Progress: Goal set today Pt will go Sit to Supine/Side: with supervision PT Goal: Sit to Supine/Side - Progress: Goal set today Pt will go Sit to Stand: with supervision PT Goal: Sit to Stand - Progress: Goal set today Pt will go Stand to Sit: with supervision PT Goal: Stand to Sit - Progress: Goal set today Pt will Ambulate: 51 - 150 feet;with supervision;with rolling walker PT Goal: Ambulate - Progress: Goal set today Pt will Perform Home Exercise Program: with supervision, verbal cues required/provided PT Goal: Perform Home Exercise Program - Progress: Goal set today  Visit Information  Last PT Received On: 07/22/12 Assistance Needed: +1    Subjective Data  Subjective: pt on [facility] Patient Stated Goal: home   Prior Functioning  Home Living Lives With: Significant other Type of Home: House Home Access: Level entry Home Layout: Two level;Able to live on main level with bedroom/bathroom (half bath downstairs) Home Adaptive Equipment: None Prior Function Level of Independence: Independent Communication Communication: No difficulties    Cognition  Overall Cognitive Status: Appears within functional limits for tasks assessed/performed Arousal/Alertness: Awake/alert Orientation Level: Appears intact for tasks assessed Behavior During Session: [facility] for tasks performed    Extremity/Trunk Assessment Right Lower Extremity Assessment RLE ROM/Strength/Tone: Deficits;Due to pain RLE ROM/Strength/Tone Deficits: hip and knee grossly 2+/5; ankle  WFL Left Lower Extremity Assessment  LLE ROM/Strength/Tone: [facility] for tasks assessed   Balance    End of Session PT - End of Session Activity Tolerance: Patient tolerated treatment well Patient left: in chair;with call bell/phone within reach;with family/visitor present Nurse Communication: Mobility status  GP     [facility] 07/22/2012, 10:51 AM

## 2012-07-23 LAB — BASIC METABOLIC PANEL
BUN: 8 mg/dL (ref 6–23)
Chloride: 100 mEq/L (ref 96–112)
Creatinine, Ser: 0.63 mg/dL (ref 0.50–1.10)
GFR calc Af Amer: >90 mL/min (ref >90)
Glucose, Bld: 109 mg/dL — ABNORMAL HIGH (ref 70–99)

## 2012-07-23 LAB — CBC
HCT: 28.5 % — ABNORMAL LOW (ref 36.0–46.0)
Hemoglobin: 9.7 g/dL — ABNORMAL LOW (ref 12.0–15.0)
MCV: 86.9 fL (ref 78.0–100.0)
RDW: 13.3 % (ref 11.5–15.5)
WBC: 11.8 10*3/uL — ABNORMAL HIGH (ref 4.0–10.5)

## 2012-07-23 MED ORDER — CLOBETASOL PROPIONATE 0.05 % EX CREA
TOPICAL_CREAM | Freq: Every morning | CUTANEOUS | Status: DC
  Administered 2012-07-23: 10:00:00 via TOPICAL
  Filled 2012-07-23 (×2): qty 15

## 2012-07-23 MED ORDER — CLOBETASOL PROPIONATE 0.05 % EX CREA: TOPICAL_CREAM | Freq: Once | CUTANEOUS | Status: DC

## 2012-07-23 NOTE — Progress Notes (Signed)
Physical Therapy Treatment Patient Details Name: Monica [name] MRN: [id number] DOB: December 13, 1955 Today's Date: 07/23/2012 Time: 1210-1249 PT Time Calculation (min): 39 min  PT Assessment / Plan / Recommendation Comments on Treatment Session  pt progressing well    Follow Up Recommendations  Home health PT     Does the patient have the potential to tolerate intense rehabilitation     Barriers to Discharge        Equipment Recommendations  Rolling walker with 5" wheels;3 in 1 bedside comode    Recommendations for Other Services    Frequency 7X/week   Plan Discharge plan remains appropriate;Frequency remains appropriate    Precautions / Restrictions Precautions Precautions: Posterior Hip Precaution Comments: sign in room Restrictions RLE Weight Bearing: Weight bearing as tolerated   Pertinent Vitals/Pain     Mobility  Transfers Transfers: Sit to Stand;Stand to Sit Sit to Stand: 5: Supervision;With upper extremity assist;From bed Stand to Sit: 5: Supervision;With upper extremity assist;With armrests;To chair/3-in-1 Ambulation/Gait Ambulation/Gait Assistance: 5: Supervision;4: Min guard Ambulation Distance (Feet): 260 Feet Assistive device: Rolling walker Ambulation/Gait Assistance Details: cues for sequence, step through,maintain trunk midline in stance Gait Pattern: Step-to pattern;Antalgic General Gait Details: pt wanted to try taking steps with her cane; pt able to take one and a half steps  with min/mod assist and then agreed to return to RW and also then agreed with PT that it was too soon for the cane    Exercises     PT Diagnosis:    PT Problem List:   PT Treatment Interventions:     PT Goals Acute Rehab PT Goals Time For Goal Achievement: 07/27/12 Potential to Achieve Goals: Good Pt will go Sit to Stand: with supervision PT Goal: Sit to Stand - Progress: Progressing toward goal Pt will go Stand to Sit: with supervision PT Goal: Stand to Sit - Progress:  Progressing toward goal Pt will Ambulate: 51 - 150 feet;with supervision;with rolling walker PT Goal: Ambulate - Progress: Progressing toward goal  Visit Information  Last PT Received On: 07/23/12 Assistance Needed: +1    Subjective Data      Cognition  Overall Cognitive Status: Appears within functional limits for tasks assessed/performed Arousal/Alertness: Awake/alert Orientation Level: Appears intact for tasks assessed Behavior During Session: [facility] for tasks performed    Balance  Balance Balance Assessed: Yes Static Standing Balance Static Standing - Balance Support: No upper extremity supported;During functional activity Static Standing - Level of Assistance: 5: Stand by assistance  End of Session PT - End of Session Equipment Utilized During Treatment: Gait belt Activity Tolerance: Patient tolerated treatment well Patient left: in chair;with call bell/phone within reach;with family/visitor present Nurse Communication: Mobility status   GP     [facility] 07/23/2012, 2:01 PM

## 2012-07-23 NOTE — Progress Notes (Signed)
Occupational Therapy Treatment Patient Details Name: Monica [name] MRN: [id number] DOB: Jan 30, 1956 Today's Date: 07/23/2012 Time: 4098-1191 OT Time Calculation (min): 55 min  OT Assessment / Plan / Recommendation Comments on Treatment Session This 56 yo female s/p posterior RTHR making progress    Follow Up Recommendations  Home health OT;Supervision - Intermittent       Equipment Recommendations  Rolling walker with 5" wheels;3 in 1 bedside comode       Frequency Min 2X/week   Plan Discharge plan remains appropriate    Precautions / Restrictions Precautions Precautions: Posterior Hip Restrictions Weight Bearing Restrictions: No RLE Weight Bearing: Weight bearing as tolerated   Pertinent Vitals/Pain 6/10 right hip    ADL  Lower Body Dressing: Performed;Set up;Supervision/safety Where Assessed - Lower Body Dressing: Unsupported sit to stand Toilet Transfer: Performed;Supervision/safety Toilet Transfer Method: Sit to stand Toilet Transfer Equipment: Raised toilet seat with arms (or 3-in-1 over toilet) Toileting - Clothing Manipulation and Hygiene: Performed;Independent Where Assessed - Toileting Clothing Manipulation and Hygiene: Standing Equipment Used: Rolling walker;Sock aid;Reacher Transfers/Ambulation Related to ADLs: Supervision for sit to stand and stand to sit due to pt needs VCs for RLE and Bil UE hand placement ADL Comments: Pt aware she can get AE in [employer] gift shop     OT Goals ADL Goals ADL Goal: Lower Body Dressing - Progress: Met ADL Goal: Toilet Transfer - Progress: Met ADL Goal: Toileting - Clothing Manipulation - Progress: Met  Visit Information  Last OT Received On: 07/23/12 Assistance Needed: +1          Cognition  Overall Cognitive Status: Appears within functional limits for tasks assessed/performed Arousal/Alertness: Awake/alert Orientation Level: Appears intact for tasks assessed Behavior During Session: [facility] for tasks performed     Mobility   Bed Mobility Bed Mobility: Supine to Sit Supine to Sit: HOB elevated;With rails;5: Supervision (using sheet to A with RLE, HOB 30 degrees) Transfers Transfers: Sit to Stand;Stand to Sit Sit to Stand: 5: Supervision;With upper extremity assist;From bed Stand to Sit: 5: Supervision;With upper extremity assist;With armrests;To chair/3-in-1 Details for Transfer Assistance: VCs for RLE and Bil UE placement             End of Session OT - End of Session Equipment Utilized During Treatment: Gait belt (RW,AE) Activity Tolerance: Patient tolerated treatment well Patient left: in chair;with call bell/phone within reach;with family/visitor present       Evette Georges [phone] 07/23/2012, 9:50 AM

## 2012-07-23 NOTE — Progress Notes (Signed)
   07/23/12 1500  PT Visit Information  Last PT Received On 07/23/12  Assistance Needed +1  PT Time Calculation  PT Start Time 1441  PT Stop Time 1535  PT Time Calculation (min) 54 min  Subjective Data  Subjective I feel good  Precautions  Precautions Posterior Hip  Restrictions  RLE Weight Bearing WBAT  Bed Mobility  Bed Mobility Sit to Supine  Sit to Supine 4: Min assist  Details for Bed Mobility Assistance min assist with LLE   Transfers  Transfers Sit to Stand;Stand to Sit  Sit to Stand 5: Supervision;4: Min assist;From chair/3-in-1 (x3)  Stand to Sit 5: Supervision;With upper extremity assist;To chair/3-in-1;To bed (X3)  Details for Transfer Assistance verbal cues for hand placement and THP; initial assist with lowering RLE from reclined position due to pain  Ambulation/Gait  Ambulation/Gait Assistance 5: Supervision;6: Modified independent (Device/Increase time)  Ambulation Distance (Feet) 320 Feet (10'3)  Assistive device Rolling walker  Ambulation/Gait Assistance Details cues for sequence, step through,maintain trunk midline in stance  Gait Pattern Step-to pattern;Step-through pattern  Stairs Yes  Stairs Assistance 4: Min guard;4: Min assist  Stairs Assistance Details (indicate cue type and reason) cues for sequence and wt shift  Stair Management Technique One rail Right;With crutches;Forwards  Number of Stairs 5  (pt has stairs to second level only)  Total Joint Exercises  Ankle Circles/Pumps AROM;Both;20 reps  Quad Sets AROM;Both;10 reps  Heel Slides AAROM;Right;10 reps  Hip ABduction/ADduction AAROM;Right;10 reps  PT - End of Session  Activity Tolerance Patient tolerated treatment well  Patient left in bed;with call bell/phone within reach;with family/visitor present  PT - Assessment/Plan  Comments on Treatment Session pt progressing well, very motivated to get back to work (at [employer])  PT Plan Discharge plan remains appropriate;Frequency remains  appropriate  PT Frequency 7X/week  Follow Up Recommendations Home health PT  Equipment Recommended Rolling walker with 5" wheels;3 in 1 bedside comode  Acute Rehab PT Goals  Time For Goal Achievement 07/27/12  Potential to Achieve Goals Good  Pt will go Sit to Supine/Side with supervision  PT Goal: Sit to Supine/Side - Progress Progressing toward goal  Pt will go Sit to Stand with supervision  PT Goal: Sit to Stand - Progress Met  Pt will go Stand to Sit with supervision  PT Goal: Stand to Sit - Progress Met  Pt will Ambulate 51 - 150 feet;with supervision;with rolling walker  PT Goal: Ambulate - Progress Met  Pt will Perform Home Exercise Program with supervision, verbal cues required/provided  PT Goal: Perform Home Exercise Program - Progress Progressing toward goal  PT General Charges  $$ ACUTE PT VISIT 1 Procedure  PT Treatments  $Gait Training 23-37 mins  $Therapeutic Exercise 8-22 mins  $Therapeutic Activity 8-22 mins

## 2012-07-23 NOTE — Progress Notes (Signed)
   Subjective: 2 Days Post-Op Procedure(s) (LRB): TOTAL HIP ARTHROPLASTY (Right) Patient reports pain as mild.   Patient seen in rounds with Dr. Darrelyn Hillock. Patient is well, and has had no acute complaints or problems. No issues overnight. She denies chest pain and shortness of breath. She is hesitant to go home today due to fear of bad weather. She also feels that she should work with therapy a little bit more. Voiding well. No BM yet.  Plan is to go Home after hospital stay.  Objective: Vital signs in last 24 hours: Temp:  [97.9 F (36.6 C)-98.3 F (36.8 C)] 98.3 F (36.8 C) (12/08 0552) Pulse Rate:  [86-96] 91  (12/08 0552) Resp:  [16-20] 20  (12/08 0552) BP: (119-138)/(75-85) 122/84 mmHg (12/08 0552) SpO2:  [93 %-97 %] 97 % (12/08 0552)  Intake/Output from previous day:  Intake/Output Summary (Last 24 hours) at 07/23/12 0844 Last data filed at 07/23/12 0553  Gross per 24 hour  Intake 738.17 ml  Output    901 ml  Net -162.83 ml     Labs:  Basename 07/23/12 0500 07/22/12 0600  HGB 9.7* 10.3*    Basename 07/23/12 0500 07/22/12 0600  WBC 11.8* 11.5*  RBC 3.28* 3.54*  HCT 28.5* 30.8*  PLT 356 381    Basename 07/23/12 0500 07/22/12 0600  NA 137 135  K 3.7 3.6  CL 100 99  CO2 29 25  BUN 8 7  CREATININE 0.63 0.56  GLUCOSE 109* 147*  CALCIUM 8.8 8.3*    EXAM General - Patient is Alert and Oriented Extremity - Neurologically intact Neurovascular intact Dorsiflexion/Plantar flexion intact Dressing/Incision - clean, dry, no drainage Motor Function - intact, moving foot and toes well on exam.   Past Medical History  Diagnosis Date  . Atrial fibrillation   . Right hip pain   . Hypertension   . Morbid obesity   . Psoriasis     active breakout left buttocks    Assessment/Plan: 2 Days Post-Op Procedure(s) (LRB): TOTAL HIP ARTHROPLASTY (Right) Principal Problem:  *Avascular necrosis of hip  Estimated Body mass index is 43.58 kg/(m^2) as calculated from the  following:   Height as of this encounter: 5\' 5" (1.651 m).   Weight as of this encounter: 261 lb 14.5 oz(118.8 kg). Advance diet Up with therapy Plan for discharge tomorrow Discharge home with home health tomorrow DVT Prophylaxis - Xarelto Weight Bearing As Tolerated Right Leg  We discussed possibility of her going home today. She did not want to go home today due to the chance of bad weather. She also feels that she needs to work with therapy more.   Genevra Orne LAUREN 07/23/2012, 8:44 AM

## 2012-07-24 ENCOUNTER — Encounter (HOSPITAL_COMMUNITY): Payer: Self-pay | Admitting: Orthopedic Surgery

## 2012-07-24 DIAGNOSIS — D62 Acute posthemorrhagic anemia: Secondary | ICD-10-CM | POA: Diagnosis not present

## 2012-07-24 LAB — CBC
HCT: 29 % — ABNORMAL LOW (ref 36.0–46.0)
MCH: 29.3 pg (ref 26.0–34.0)
MCHC: 33.4 g/dL (ref 30.0–36.0)
MCV: 87.6 fL (ref 78.0–100.0)
RDW: 13.5 % (ref 11.5–15.5)

## 2012-07-24 MED ORDER — OXYCODONE HCL 5 MG PO TABS: 5.0000 mg | ORAL_TABLET | ORAL | Status: DC | PRN

## 2012-07-24 MED ORDER — METHOCARBAMOL 500 MG PO TABS: 500.0000 mg | ORAL_TABLET | Freq: Four times a day (QID) | ORAL | Status: DC | PRN

## 2012-07-24 NOTE — Progress Notes (Signed)
Physical Therapy Treatment Patient Details Name: Monica [name] MRN: [id number] DOB: 1956-01-29 Today's Date: 07/24/2012 Time: 7829-5621 PT Time Calculation (min): 26 min  PT Assessment / Plan / Recommendation Comments on Treatment Session  Pt continues to progress very well and is ready for D/C.     Follow Up Recommendations  Home health PT     Does the patient have the potential to tolerate intense rehabilitation     Barriers to Discharge        Equipment Recommendations  Rolling walker with 5" wheels    Recommendations for Other Services    Frequency 7X/week   Plan Discharge plan remains appropriate;Frequency remains appropriate    Precautions / Restrictions Precautions Precautions: Posterior Hip Precaution Comments: reviewed THPs with pt for discharge today Restrictions Weight Bearing Restrictions: No RLE Weight Bearing: Weight bearing as tolerated   Pertinent Vitals/Pain 2/10 pain    Mobility  Bed Mobility Bed Mobility: Not assessed (Pt in recliner when PT arrived. ) Transfers Transfers: Sit to Stand;Stand to Sit Sit to Stand: 5: Supervision;With upper extremity assist;From chair/3-in-1 Stand to Sit: 5: Supervision;With upper extremity assist;To chair/3-in-1 Details for Transfer Assistance: very min cues for controlled descent when sitting.  Ambulation/Gait Ambulation/Gait Assistance: 6: Modified independent (Device/Increase time) Ambulation Distance (Feet): 200 Feet Assistive device: Rolling walker Gait Pattern: Step-to pattern;Step-through pattern Gait velocity: decreased Stairs: Yes Stairs Assistance: 4: Min guard;4: Min assist Stairs Assistance Details (indicate cue type and reason): Cues for sequencing/technique with crutch and rail.  Assist to steady throughout.  Stair Management Technique: One rail Right;One rail Left;Step to pattern;Forwards;With crutches (tried crutch on R then L) Number of Stairs: 2  (x 3 reps)    Exercises     PT Diagnosis:     PT Problem List:   PT Treatment Interventions:     PT Goals Acute Rehab PT Goals PT Goal Formulation: With patient Time For Goal Achievement: 07/27/12 Potential to Achieve Goals: Good Pt will go Sit to Stand: with supervision PT Goal: Sit to Stand - Progress: Met Pt will go Stand to Sit: with supervision PT Goal: Stand to Sit - Progress: Met Pt will Ambulate: 51 - 150 feet;with supervision;with rolling walker PT Goal: Ambulate - Progress: Met Pt will Perform Home Exercise Program: with supervision, verbal cues required/provided PT Goal: Perform Home Exercise Program - Progress: Met  Visit Information  Last PT Received On: 07/24/12 Assistance Needed: +1    Subjective Data  Subjective: I want to practice stairs again Patient Stated Goal: home   Cognition  Overall Cognitive Status: Appears within functional limits for tasks assessed/performed Arousal/Alertness: Awake/alert Orientation Level: Appears intact for tasks assessed Behavior During Session: [facility] for tasks performed    Balance     End of Session PT - End of Session Activity Tolerance: Patient tolerated treatment well Patient left: in chair;with call bell/phone within reach;with family/visitor present Nurse Communication: Mobility status   GP     Page, Meribeth Mattes 07/24/2012, 10:59 AM

## 2012-07-24 NOTE — Progress Notes (Signed)
Occupational Therapy Treatment Patient Details Name: Monica [name] MRN: [id number] DOB: August 31, 1955 Today's Date: 07/24/2012 Time: 4098-1191 OT Time Calculation (min): 36 min  OT Assessment / Plan / Recommendation Comments on Treatment Session Pt did well with AE practice and functional transfers. D/c today planned.    Follow Up Recommendations  Home health OT;Supervision - Intermittent    Barriers to Discharge       Equipment Recommendations  Rolling walker with 5" wheels;3 in 1 bedside comode    Recommendations for Other Services    Frequency Min 2X/week   Plan Discharge plan remains appropriate    Precautions / Restrictions Precautions Precautions: Posterior Hip Precaution Comments: reviewed THPs with pt for discharge today Restrictions Weight Bearing Restrictions: No RLE Weight Bearing: Weight bearing as tolerated        ADL  Lower Body Bathing: Performed;Supervision/safety;Other (comment) (for sit to stand to wash periareas only. reviewed LHS) Where Assessed - Lower Body Bathing: Supported sit to stand ADL Comments: Significant other present for session. Reviewed THPs and AE use. Pt needed some reinforcement with LB dressing using AE so practiced again today. Used reacher to don underwear with supervision and min verbal cues for technique. She has slip on shoes she plans to wear home. Reviewed how to use AE to don socks and tie lace shoes and recommended elastic laces if she is wearing tennis shoes for increased independence. Discussed LHS also for washing lower legs and feet. Pt did well with functional transfers to toilet and multiple times sit to stand from 3in1 to complete bathing and dressing.  Only needed verbal cues X1 for hand placement with 3in1 and not grab bar as she wont have grab bar at home and for R LE management.   OT Diagnosis:    OT Problem List:   OT Treatment Interventions:     OT Goals ADL Goals ADL Goal: Lower Body Bathing - Progress: Progressing  toward goals (worked on sit to stand aspect for [employer]. discussed LHS)  Visit Information  Last OT Received On: 07/24/12 Assistance Needed: +1    Subjective Data  Subjective: I would like to wash up Patient Stated Goal: home   Prior Functioning       Cognition  Overall Cognitive Status: Appears within functional limits for tasks assessed/performed Arousal/Alertness: Awake/alert Orientation Level: Appears intact for tasks assessed Behavior During Session: [facility] for tasks performed    Mobility  Shoulder Instructions Transfers Transfers: Sit to Stand;Stand to Sit Sit to Stand: 5: Supervision;With upper extremity assist;From chair/3-in-1 Stand to Sit: 5: Supervision;With upper extremity assist;To chair/3-in-1 Details for Transfer Assistance: verbal cues X1 for hand placement and R LE management. multiple times performed  sit to stand for bathing/dressing.        Exercises      Balance     End of Session OT - End of Session Activity Tolerance: Patient tolerated treatment well Patient left: in chair;with call bell/phone within reach  GO     Lennox Laity [phone] 07/24/2012, 10:12 AM

## 2012-07-24 NOTE — Discharge Summary (Signed)
Physician Discharge Summary   Patient ID: Monica [name] MRN: [id number] DOB/AGE: 18-Mar-1956 56 y.o.  Admit date: 07/21/2012 Discharge date: 07/24/2012  Primary Diagnosis: AVN Right hip  Admission Diagnoses:  Past Medical History  Diagnosis Date  . Atrial fibrillation   . Right hip pain   . Hypertension   . Morbid obesity   . Psoriasis     active breakout left buttocks   Discharge Diagnoses:   Principal Problem:  *Avascular necrosis of hip Active Problems:  Postop Acute blood loss anemia  Estimated Body mass index is 43.58 kg/(m^2) as calculated from the following:   Height as of this encounter: 5\' 5" (1.651 m).   Weight as of this encounter: 261 lb 14.5 oz(118.8 kg).  Classification of overweight in adults according to BMI (WHO, 1998)   Procedure: Procedure(s) (LRB): TOTAL HIP ARTHROPLASTY (Right)   Consults: None  HPI: Monica [name] is a 56 y.o. 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 Pain occurs with activity and rest including pain at night. She has tried analgesics, protected weight bearing and rest without benefit. Pain is too severe to attempt physical therapy. Radiographs demonstrate femoral head collapse with subchondral cyst formation. She presents now for right THA.  Laboratory Data: Admission on 07/21/2012  Component Date Value Range Status  . Preg, Serum 07/21/2012 NEGATIVE  NEGATIVE Final  . ABO/RH(D) 07/21/2012 B POS   Final  . Antibody Screen 07/21/2012 NEG   Final  . Sample Expiration 07/21/2012 07/24/2012   Final  . ABO/RH(D) 07/21/2012 B POS   Final  . WBC 07/22/2012 11.5* 4.0 - 10.5 K/uL Final  . RBC 07/22/2012 3.54* 3.87 - 5.11 MIL/uL Final  . Hemoglobin 07/22/2012 10.3* 12.0 - 15.0 g/dL Final  . HCT 62/95/2841 30.8* 36.0 - 46.0 % Final  . MCV 07/22/2012 87.0  78.0 - 100.0 fL Final  . MCH 07/22/2012 29.1  26.0 - 34.0 pg Final  . MCHC 07/22/2012 33.4  30.0 - 36.0 g/dL Final  . RDW  32/44/0102 13.4  11.5 - 15.5 % Final  . Platelets 07/22/2012 381  150 - 400 K/uL Final  . Sodium 07/22/2012 135  135 - 145 mEq/L Final  . Potassium 07/22/2012 3.6  3.5 - 5.1 mEq/L Final  . Chloride 07/22/2012 99  96 - 112 mEq/L Final  . CO2 07/22/2012 25  19 - 32 mEq/L Final  . Glucose, Bld 07/22/2012 147* 70 - 99 mg/dL Final  . BUN 72/53/6644 7  6 - 23 mg/dL Final  . Creatinine, Ser 07/22/2012 0.56  0.50 - 1.10 mg/dL Final  . Calcium 03/47/4259 8.3* 8.4 - 10.5 mg/dL Final  . GFR calc non Af Amer 07/22/2012 >90  >90 mL/min Final  . GFR calc Af Amer 07/22/2012 >90  >90 mL/min Final   Comment:                                 The eGFR has been calculated                          using the CKD EPI equation.                          This calculation has not been  validated in all clinical                          situations.                          eGFR's persistently                          <90 mL/min signify                          possible Chronic Kidney Disease.  . WBC 07/23/2012 11.8* 4.0 - 10.5 K/uL Final  . RBC 07/23/2012 3.28* 3.87 - 5.11 MIL/uL Final  . Hemoglobin 07/23/2012 9.7* 12.0 - 15.0 g/dL Final  . HCT 16/05/9603 28.5* 36.0 - 46.0 % Final  . MCV 07/23/2012 86.9  78.0 - 100.0 fL Final  . MCH 07/23/2012 29.6  26.0 - 34.0 pg Final  . MCHC 07/23/2012 34.0  30.0 - 36.0 g/dL Final  . RDW 54/04/8118 13.3  11.5 - 15.5 % Final  . Platelets 07/23/2012 356  150 - 400 K/uL Final  . Sodium 07/23/2012 137  135 - 145 mEq/L Final  . Potassium 07/23/2012 3.7  3.5 - 5.1 mEq/L Final  . Chloride 07/23/2012 100  96 - 112 mEq/L Final  . CO2 07/23/2012 29  19 - 32 mEq/L Final  . Glucose, Bld 07/23/2012 109* 70 - 99 mg/dL Final  . BUN 14/78/2956 8  6 - 23 mg/dL Final  . Creatinine, Ser 07/23/2012 0.63  0.50 - 1.10 mg/dL Final  . Calcium 21/30/8657 8.8  8.4 - 10.5 mg/dL Final  . GFR calc non Af Amer 07/23/2012 >90  >90 mL/min Final  . GFR calc Af Amer 07/23/2012  >90  >90 mL/min Final   Comment:                                 The eGFR has been calculated                          using the CKD EPI equation.                          This calculation has not been                          validated in all clinical                          situations.                          eGFR's persistently                          <90 mL/min signify                          possible Chronic Kidney Disease.  . WBC 07/24/2012 9.0  4.0 - 10.5 K/uL Final  . RBC 07/24/2012 3.31* 3.87 - 5.11 MIL/uL Final  . Hemoglobin 07/24/2012 9.7* 12.0 - 15.0 g/dL Final  . HCT 84/69/6295 29.0* 36.0 -  46.0 % Final  . MCV 07/24/2012 87.6  78.0 - 100.0 fL Final  . MCH 07/24/2012 29.3  26.0 - 34.0 pg Final  . MCHC 07/24/2012 33.4  30.0 - 36.0 g/dL Final  . RDW 84/69/6295 13.5  11.5 - 15.5 % Final  . Platelets 07/24/2012 321  150 - 400 K/uL Final  Hospital Outpatient Visit on 07/12/2012  Component Date Value Range Status  . MRSA, PCR 07/12/2012 NEGATIVE  NEGATIVE Final  . Staphylococcus aureus 07/12/2012 POSITIVE* NEGATIVE Final   Comment:                                 The Xpert SA Assay (FDA                          approved for NASAL specimens                          in patients over 41 years of age),                          is one component of                          a comprehensive surveillance                          program.  Test performance has                          been validated by [employer] for patients greater                          than or equal to 56 year old.                          It is not intended                          to diagnose infection nor to                          guide or monitor treatment.  Marland Kitchen aPTT 07/12/2012 32  24 - 37 seconds Final  . WBC 07/12/2012 5.5  4.0 - 10.5 K/uL Final  . RBC 07/12/2012 4.36  3.87 - 5.11 MIL/uL Final  . Hemoglobin 07/12/2012 12.5  12.0 - 15.0 g/dL Final  . HCT 28/41/3244 37.7   36.0 - 46.0 % Final  . MCV 07/12/2012 86.5  78.0 - 100.0 fL Final  . MCH 07/12/2012 28.7  26.0 - 34.0 pg Final  . MCHC 07/12/2012 33.2  30.0 - 36.0 g/dL Final  . RDW 08/18/7251 13.3  11.5 - 15.5 % Final  . Platelets 07/12/2012 455* 150 - 400 K/uL Final  . Sodium 07/12/2012 139  135 - 145 mEq/L Final  . Potassium 07/12/2012 3.9  3.5 - 5.1 mEq/L Final  . Chloride 07/12/2012 101  96 - 112 mEq/L Final  . CO2 07/12/2012  27  19 - 32 mEq/L Final  . Glucose, Bld 07/12/2012 93  70 - 99 mg/dL Final  . BUN 16/05/9603 11  6 - 23 mg/dL Final  . Creatinine, Ser 07/12/2012 0.68  0.50 - 1.10 mg/dL Final  . Calcium 54/04/8118 9.4  8.4 - 10.5 mg/dL Final  . Total Protein 07/12/2012 7.5  6.0 - 8.3 g/dL Final  . Albumin 14/78/2956 3.5  3.5 - 5.2 g/dL Final  . AST 21/30/8657 16  0 - 37 U/L Final  . ALT 07/12/2012 33  0 - 35 U/L Final  . Alkaline Phosphatase 07/12/2012 111  39 - 117 U/L Final  . Total Bilirubin 07/12/2012 0.4  0.3 - 1.2 mg/dL Final  . GFR calc non Af Amer 07/12/2012 >90  >90 mL/min Final  . GFR calc Af Amer 07/12/2012 >90  >90 mL/min Final   Comment:                                 The eGFR has been calculated                          using the CKD EPI equation.                          This calculation has not been                          validated in all clinical                          situations.                          eGFR's persistently                          <90 mL/min signify                          possible Chronic Kidney Disease.  Marland Kitchen Prothrombin Time 07/12/2012 14.5  11.6 - 15.2 seconds Final  . INR 07/12/2012 1.15  0.00 - 1.49 Final  . Color, Urine 07/12/2012 YELLOW  YELLOW Final  . APPearance 07/12/2012 CLOUDY* CLEAR Final  . Specific Gravity, Urine 07/12/2012 1.016  1.005 - 1.030 Final  . pH 07/12/2012 7.0  5.0 - 8.0 Final  . Glucose, UA 07/12/2012 NEGATIVE  NEGATIVE mg/dL Final  . Hgb urine dipstick 07/12/2012 NEGATIVE  NEGATIVE Final  . Bilirubin Urine  07/12/2012 NEGATIVE  NEGATIVE Final  . Ketones, ur 07/12/2012 NEGATIVE  NEGATIVE mg/dL Final  . Protein, ur 84/69/6295 NEGATIVE  NEGATIVE mg/dL Final  . Urobilinogen, UA 07/12/2012 1.0  0.0 - 1.0 mg/dL Final  . Nitrite 28/41/3244 NEGATIVE  NEGATIVE Final  . Leukocytes, UA 07/12/2012 SMALL* NEGATIVE Final  . Squamous Epithelial / LPF 07/12/2012 RARE  RARE Final  . WBC, UA 07/12/2012 0-2  <3 WBC/hpf Final  . Bacteria, UA 07/12/2012 RARE  RARE Final  . Casts 07/12/2012 HYALINE CASTS* NEGATIVE Final     X-Rays:Dg Chest 2 View  07/12/2012  *RADIOLOGY REPORT*  Clinical Data: Preop for right hip replacement  CHEST - 2 VIEW  Comparison: 09/20/2010  Findings: Borderline cardiomegaly again noted.  No acute  infiltrate or pleural effusion.  No pulmonary edema.  Stable mild lower thoracic dextroscoliosis.  Again noted mild degenerative changes thoracic spine.  IMPRESSION: No active disease.  No significant change.   Original Report Authenticated By: Natasha Mead, M.D.    Dg Hip Complete Right  07/12/2012  *RADIOLOGY REPORT*  Clinical Data: Preop for right hip replacement  RIGHT HIP - COMPLETE 2+ VIEW  Comparison: MRI 11/23/2011 and x-rays 07/18/2011  Findings: Three views of the right hip submitted.  No acute fracture or subluxation.  There is narrowing of superior joint space and mild superior acetabular sclerosis.  Subtle sclerotic changes and mild flattening of the superior aspect of the femoral head suspicious for avascular necrosis.  IMPRESSION:  No acute fracture or subluxation.  There is narrowing of superior joint space and mild superior acetabular sclerosis.  Subtle sclerotic changes and mild flattening of the superior aspect of the femoral head suspicious for avascular necrosis.   Original Report Authenticated By: Natasha Mead, M.D.    Dg Pelvis Portable  07/21/2012  *RADIOLOGY REPORT*  Clinical Data: Post right hip surgery.  PORTABLE RIGHT HIP - 1 VIEW,PORTABLE PELVIS  Comparison: 07/12/2012.  Findings:  AP pelvis and the right hip reveal total right hip prosthesis appearing in satisfactory position without complication noted on this single projection.  Surgical drain is in place.  IMPRESSION: Total right hip replacement appears in satisfactory position on this single projection without complication noted.   Original Report Authenticated By: Lacy Duverney, M.D.    Dg Hip Portable 1 View Right  07/21/2012  *RADIOLOGY REPORT*  Clinical Data: Post right hip surgery.  PORTABLE RIGHT HIP - 1 VIEW,PORTABLE PELVIS  Comparison: 07/12/2012.  Findings: AP pelvis and the right hip reveal total right hip prosthesis appearing in satisfactory position without complication noted on this single projection.  Surgical drain is in place.  IMPRESSION: Total right hip replacement appears in satisfactory position on this single projection without complication noted.   Original Report Authenticated By: Lacy Duverney, M.D.    Mm Digital Screening  06/29/2012  *RADIOLOGY REPORT*  Clinical Data: Screening.  DIGITAL BILATERAL SCREENING MAMMOGRAM WITH CAD  Comparison:  Previous exams.  Findings:  There are scattered fibroglandular densities. No suspicious masses, architectural distortion, or calcifications are present.  Images were processed with CAD.  IMPRESSION: No mammographic evidence of malignancy.  A result letter of this screening mammogram will be mailed directly to the patient.  RECOMMENDATION: Screening mammogram in one year. (Code:SM-B-01Y)  BI-RADS CATEGORY 2:  Benign finding(s).   Original Report Authenticated By: Sherian Rein, M.D.     EKG: Orders placed during the hospital encounter of 07/21/12  . EKG 12-LEAD  . EKG 12-LEAD     Hospital Course: Patient was admitted to [facility] and taken to the OR and underwent the above state procedure without complications.  Patient tolerated the procedure well and was later transferred to the recovery room and then to the orthopaedic floor for postoperative care.   They were given PO and IV analgesics for pain control following their surgery.  They were given 24 hours of postoperative antibiotics of  Anti-infectives     Start     Dose/Rate Route Frequency Ordered Stop   07/21/12 1600   ceFAZolin (ANCEF) IVPB 2 g/50 mL premix        2 g 100 mL/hr over 30 Minutes Intravenous Every 6 hours 07/21/12 1250 07/22/12 0020   07/21/12 0651   ceFAZolin (ANCEF) IVPB 2 g/50 mL premix  2 g 100 mL/hr over 30 Minutes Intravenous 60 min pre-op 07/21/12 0651 07/21/12 0953         and started on DVT prophylaxis in the form of Xarelto.   PT and OT were ordered for total hip protocol.  The patient was allowed to be WBAT with therapy. Discharge planning was consulted to help with postop disposition and equipment needs.  Patient had a fair night on the evening of surgery and started to get up OOB with therapy on day one walking 65 feet and then 120 feet.  Hemovac drain was pulled without difficulty.  The knee immobilizer was removed and discontinued.  Continued to work with therapy into day two.  Dressing was changed on day two and the incision was healing well.  By day three, the patient had progressed with therapy and meeting their goals.  Incision was healing well.  Patient was seen in rounds and was ready to go home.   Discharge Medications: Prior to Admission medications   Medication Sig Start Date End Date Taking? Authorizing Provider  acetaminophen (TYLENOL) 500 MG tablet Take 1,000 mg by mouth 2 (two) times daily.   Yes Historical Provider, MD  celecoxib (CELEBREX) 200 MG capsule Take 200 mg by mouth 2 (two) times daily.   Yes Historical Provider, MD  clobetasol cream (TEMOVATE) 0.05 % Apply 1 application topically daily as needed. Apply as needed for psoriasis 09/14/11  Yes Sanjuana Letters, MD  desonide (DESOWEN) 0.05 % lotion Apply to rash on face or under breasts twice daily for 7 to 10 days 09/14/11  Yes Sanjuana Letters, MD  hydrochlorothiazide  (HYDRODIURIL) 25 MG tablet Take 12.5 mg by mouth daily.   Yes Historical Provider, MD  nebivolol (BYSTOLIC) 5 MG tablet Take 5 mg by mouth daily.   Yes Historical Provider, MD  Potassium 95 MG TABS Take 95 mg by mouth daily.   Yes Historical Provider, MD  methocarbamol (ROBAXIN) 500 MG tablet Take 1 tablet (500 mg total) by mouth every 6 (six) hours as needed. 07/24/12   Alexzandrew Perkins, PA  oxyCODONE (OXY IR/ROXICODONE) 5 MG immediate release tablet Take 1-2 tablets (5-10 mg total) by mouth every 3 (three) hours as needed. 07/24/12   Alexzandrew Julien Girt, PA  Rivaroxaban (XARELTO) 20 MG TABS Take 20 mg by mouth daily.    Historical Provider, MD    Diet: Cardiac diet Activity:WBAT No bending hip over 90 degrees- A "L" Angle Do not cross legs Do not let foot roll inward When turning these patients a pillow should be placed between the patient's legs to prevent crossing. Patients should have the affected knee fully extended when trying to sit or stand from all surfaces to prevent excessive hip flexion. When ambulating and turning toward the affected side the affected leg should have the toes turned out prior to moving the walker and the rest of patient's body as to prevent internal rotation/ turning in of the leg. Abduction pillows are the most effective way to prevent a patient from not crossing legs or turning toes in at rest. If an abduction pillow is not ordered placing a regular pillow length wise between the patient's legs is also an effective reminder. It is imperative that these precautions be maintained so that the surgical hip does not dislocate. Follow-up:in 2 weeks Disposition - Home Discharged Condition: good   Discharge Orders    Future Orders Please Complete By Expires   Diet - low sodium heart healthy  Call MD / Call 911      Comments:   If you experience chest pain or shortness of breath, CALL 911 and be transported to the hospital emergency room.  If you develope a  fever above 101 F, pus (white drainage) or increased drainage or redness at the wound, or calf pain, call your surgeon's office.   Discharge instructions      Comments:   Pick up stool softner and laxative for home. Do not submerge incision under water. May shower. Continue to use ice for pain and swelling from surgery. Hip precautions.  Total Hip Protocol.  Resume Xarelto.   Constipation Prevention      Comments:   Drink plenty of fluids.  Prune juice may be helpful.  You may use a stool softener, such as Colace (over the counter) 100 mg twice a day.  Use MiraLax (over the counter) for constipation as needed.   Increase activity slowly as tolerated      Patient may shower      Comments:   You may shower without a dressing once there is no drainage.  Do not wash over the wound.  If drainage remains, do not shower until drainage stops.   Weight bearing as tolerated      Driving restrictions      Comments:   No driving until released by the physician.   Lifting restrictions      Comments:   No lifting until released by the physician.   Follow the hip precautions as taught in Physical Therapy      Change dressing      Comments:   You may change your dressing dressing daily with sterile 4 x 4 inch gauze dressing and paper tape.  Do not submerge the incision under water.   TED hose      Comments:   Use stockings (TED hose) for 3 weeks on both leg(s).  You may remove them at night for sleeping.   Do not sit on low chairs, stoools or toilet seats, as it may be difficult to get up from low surfaces          Medication List     As of 07/24/2012  7:39 AM    STOP taking these medications         Etanercept 25 MG/0.5ML Soln      fish oil-omega-3 fatty acids 1000 MG capsule      GARLIC PO      HYDROcodone-acetaminophen 5-325 MG per tablet   Commonly known as: NORCO/VICODIN      naproxen sodium 220 MG tablet   Commonly known as: ANAPROX      traMADol 50 MG tablet   Commonly  known as: ULTRAM      Vitamin D (Ergocalciferol) 50000 UNITS Caps   Commonly known as: DRISDOL      WOMENS DAILY FORMULA Tabs      TAKE these medications         acetaminophen 500 MG tablet   Commonly known as: TYLENOL   Take 1,000 mg by mouth 2 (two) times daily.      celecoxib 200 MG capsule   Commonly known as: CELEBREX   Take 200 mg by mouth 2 (two) times daily.      clobetasol cream 0.05 %   Commonly known as: TEMOVATE   Apply 1 application topically daily as needed. Apply as needed for psoriasis      DESOWEN 0.05 % lotion   Generic drug: desonide  Apply to rash on face or under breasts twice daily for 7 to 10 days      hydrochlorothiazide 25 MG tablet   Commonly known as: HYDRODIURIL   Take 12.5 mg by mouth daily.      methocarbamol 500 MG tablet   Commonly known as: ROBAXIN   Take 1 tablet (500 mg total) by mouth every 6 (six) hours as needed.      nebivolol 5 MG tablet   Commonly known as: BYSTOLIC   Take 5 mg by mouth daily.      oxyCODONE 5 MG immediate release tablet   Commonly known as: Oxy IR/ROXICODONE   Take 1-2 tablets (5-10 mg total) by mouth every 3 (three) hours as needed.      Potassium 95 MG Tabs   Take 95 mg by mouth daily.      Rivaroxaban 20 MG Tabs   Commonly known as: XARELTO   Take 20 mg by mouth daily.           Follow-up Information    Follow up with Loanne Drilling, MD. Schedule an appointment as soon as possible for a visit in 2 weeks.   Contact information:   [address], [address] [place] [address] [phone]          Signed: Patrica Duel 07/24/2012, 7:39 AM

## 2012-07-24 NOTE — Progress Notes (Signed)
Discharge summary sent to payer through [facility]  

## 2012-07-24 NOTE — Progress Notes (Signed)
   Subjective: 3 Days Post-Op Procedure(s) (LRB): TOTAL HIP ARTHROPLASTY (Right) Patient reports pain as mild.   Patient seen in rounds by Dr. Lequita Halt. Patient is well, and has had no acute complaints or problems Patient is ready to go home today.  Objective: Vital signs in last 24 hours: Temp:  [98.2 F (36.8 C)-98.4 F (36.9 C)] 98.2 F (36.8 C) (12/09 1610) Pulse Rate:  [77-91] 82  (12/09 0608) Resp:  [18-20] 18  (12/09 9604) BP: (104-121)/(73-81) 104/73 mmHg (12/09 0608) SpO2:  [95 %-98 %] 95 % (12/09 5409)  Intake/Output from previous day:  Intake/Output Summary (Last 24 hours) at 07/24/12 0732 Last data filed at 07/24/12 0609  Gross per 24 hour  Intake 1229.33 ml  Output    475 ml  Net 754.33 ml    Intake/Output this shift:    Labs:  Basename 07/24/12 0433 07/23/12 0500 07/22/12 0600  HGB 9.7* 9.7* 10.3*    Basename 07/24/12 0433 07/23/12 0500  WBC 9.0 11.8*  RBC 3.31* 3.28*  HCT 29.0* 28.5*  PLT 321 356    Basename 07/23/12 0500 07/22/12 0600  NA 137 135  K 3.7 3.6  CL 100 99  CO2 29 25  BUN 8 7  CREATININE 0.63 0.56  GLUCOSE 109* 147*  CALCIUM 8.8 8.3*   No results found for this basename: LABPT:2,INR:2 in the last 72 hours  EXAM: General - Patient is Alert, Appropriate and Oriented Extremity - Neurovascular intact Sensation intact distally Dorsiflexion/Plantar flexion intact No cellulitis present Incision - clean, dry, no drainage, healing Motor Function - intact, moving foot and toes well on exam.   Assessment/Plan: 3 Days Post-Op Procedure(s) (LRB): TOTAL HIP ARTHROPLASTY (Right) Procedure(s) (LRB): TOTAL HIP ARTHROPLASTY (Right) Past Medical History  Diagnosis Date  . Atrial fibrillation   . Right hip pain   . Hypertension   . Morbid obesity   . Psoriasis     active breakout left buttocks   Principal Problem:  *Avascular necrosis of hip Active Problems:  Postop Acute blood loss anemia  Estimated Body mass index is 43.58  kg/(m^2) as calculated from the following:   Height as of this encounter: 5\' 5" (1.651 m).   Weight as of this encounter: 261 lb 14.5 oz(118.8 kg). Up with therapy Discharge home with home health Diet - Cardiac diet Follow up - in 2 weeks Activity - WBAT Disposition - Home Condition Upon Discharge - Good D/C Meds - See DC Summary DVT Prophylaxis - Xarelto  PERKINS, ALEXZANDREW 07/24/2012, 7:32 AM

## 2012-08-10 ENCOUNTER — Ambulatory Visit: Payer: 59 | Attending: Orthopedic Surgery | Admitting: Rehabilitation

## 2012-08-10 DIAGNOSIS — IMO0001 Reserved for inherently not codable concepts without codable children: Secondary | ICD-10-CM | POA: Insufficient documentation

## 2012-08-10 DIAGNOSIS — M25559 Pain in unspecified hip: Secondary | ICD-10-CM | POA: Insufficient documentation

## 2012-08-10 DIAGNOSIS — M25659 Stiffness of unspecified hip, not elsewhere classified: Secondary | ICD-10-CM | POA: Insufficient documentation

## 2012-08-10 DIAGNOSIS — R262 Difficulty in walking, not elsewhere classified: Secondary | ICD-10-CM | POA: Insufficient documentation

## 2012-08-10 DIAGNOSIS — Z96649 Presence of unspecified artificial hip joint: Secondary | ICD-10-CM | POA: Insufficient documentation

## 2012-08-17 ENCOUNTER — Ambulatory Visit: Payer: 59 | Attending: Rehabilitation | Admitting: Rehabilitation

## 2012-08-17 ENCOUNTER — Ambulatory Visit: Payer: 59 | Admitting: Rehabilitation

## 2012-08-17 DIAGNOSIS — IMO0001 Reserved for inherently not codable concepts without codable children: Secondary | ICD-10-CM | POA: Insufficient documentation

## 2012-08-17 DIAGNOSIS — Z96649 Presence of unspecified artificial hip joint: Secondary | ICD-10-CM | POA: Insufficient documentation

## 2012-08-17 DIAGNOSIS — R262 Difficulty in walking, not elsewhere classified: Secondary | ICD-10-CM | POA: Insufficient documentation

## 2012-08-17 DIAGNOSIS — M25559 Pain in unspecified hip: Secondary | ICD-10-CM | POA: Insufficient documentation

## 2012-08-17 DIAGNOSIS — M25659 Stiffness of unspecified hip, not elsewhere classified: Secondary | ICD-10-CM | POA: Insufficient documentation

## 2012-08-22 ENCOUNTER — Ambulatory Visit: Payer: 59 | Admitting: Rehabilitation

## 2012-08-25 ENCOUNTER — Ambulatory Visit: Payer: 59 | Admitting: Rehabilitation

## 2012-08-30 ENCOUNTER — Ambulatory Visit: Payer: 59 | Admitting: Rehabilitation

## 2012-09-05 ENCOUNTER — Ambulatory Visit: Payer: 59 | Admitting: Rehabilitation

## 2012-09-07 ENCOUNTER — Ambulatory Visit: Payer: 59 | Admitting: Rehabilitation

## 2012-09-12 ENCOUNTER — Ambulatory Visit (INDEPENDENT_AMBULATORY_CARE_PROVIDER_SITE_OTHER): Payer: 59 | Admitting: Pharmacist

## 2012-09-12 ENCOUNTER — Encounter: Payer: Self-pay | Admitting: Pharmacist

## 2012-09-12 VITALS — BP 128/69 | HR 98 | Ht 65.5 in | Wt 247.0 lb

## 2012-09-12 DIAGNOSIS — L409 Psoriasis, unspecified: Secondary | ICD-10-CM

## 2012-09-12 DIAGNOSIS — L408 Other psoriasis: Secondary | ICD-10-CM

## 2012-09-12 NOTE — Patient Instructions (Addendum)
Start taking your Xarelto with dinner.  Thanks for coming in today!

## 2012-09-12 NOTE — Assessment & Plan Note (Signed)
Following medication review, no suggestions for change.  Complete medication list provided to patient.  Total time in face to face medication review: 20 minutes.  Patient seen with: Doris Cheadle PharmD, Pharmacy Resident.

## 2012-09-12 NOTE — Progress Notes (Signed)
  Subjective:    Patient ID: Monica [name], female    DOB: [birth year], 57 y.o.   MRN: [id number]  HPI  Patient arrives in good spirits for medication review. Reports seeing Dr. Birdena Jubilee as primary care provider, Dr. Karlyn Agee as dermatologist, Dr. Dierdre Forth as rheumatologist, Dr. De [facility] Dr. Peter [place] [facility]) as cardiologist. Reports being diagnosed with psoriasis since 2007 and states she is under acceptable level of control.  Review of Systems     Objective:   Physical Exam        Assessment & Plan:  Following medication review, no suggestions for change.  Complete medication list provided to patient.  Total time in face to face medication review: 20 minutes.  Patient seen with: Doris Cheadle PharmD, Pharmacy Resident.

## 2012-09-13 ENCOUNTER — Ambulatory Visit: Payer: 59 | Admitting: Rehabilitation

## 2012-09-13 NOTE — Progress Notes (Signed)
Patient ID: Monica [name], female   DOB: 1956/06/07, 57 y.o.   MRN: [id number] Reviewed: Agree with Dr. Macky Lower documentation and management.

## 2012-09-18 ENCOUNTER — Ambulatory Visit: Payer: 59 | Attending: Family Medicine | Admitting: Rehabilitation

## 2012-09-18 DIAGNOSIS — Z96649 Presence of unspecified artificial hip joint: Secondary | ICD-10-CM | POA: Insufficient documentation

## 2012-09-18 DIAGNOSIS — IMO0001 Reserved for inherently not codable concepts without codable children: Secondary | ICD-10-CM | POA: Insufficient documentation

## 2012-09-18 DIAGNOSIS — R262 Difficulty in walking, not elsewhere classified: Secondary | ICD-10-CM | POA: Insufficient documentation

## 2012-09-18 DIAGNOSIS — M25659 Stiffness of unspecified hip, not elsewhere classified: Secondary | ICD-10-CM | POA: Insufficient documentation

## 2012-09-18 DIAGNOSIS — M25559 Pain in unspecified hip: Secondary | ICD-10-CM | POA: Insufficient documentation

## 2012-09-20 ENCOUNTER — Ambulatory Visit: Payer: 59 | Admitting: Rehabilitation

## 2012-09-21 ENCOUNTER — Ambulatory Visit: Payer: 59 | Admitting: Rehabilitation

## 2012-09-26 ENCOUNTER — Ambulatory Visit: Payer: 59 | Admitting: Rehabilitation

## 2012-09-28 ENCOUNTER — Encounter: Payer: 59 | Admitting: Rehabilitation

## 2012-10-03 ENCOUNTER — Ambulatory Visit: Payer: 59 | Admitting: Rehabilitation

## 2012-10-05 ENCOUNTER — Ambulatory Visit: Payer: 59 | Admitting: Rehabilitation

## 2012-10-10 ENCOUNTER — Ambulatory Visit: Payer: 59

## 2012-10-12 ENCOUNTER — Ambulatory Visit: Payer: 59 | Admitting: Rehabilitation

## 2012-10-18 ENCOUNTER — Ambulatory Visit: Payer: 59 | Attending: Orthopedic Surgery | Admitting: Rehabilitation

## 2012-10-18 DIAGNOSIS — M25659 Stiffness of unspecified hip, not elsewhere classified: Secondary | ICD-10-CM | POA: Insufficient documentation

## 2012-10-18 DIAGNOSIS — M25559 Pain in unspecified hip: Secondary | ICD-10-CM | POA: Insufficient documentation

## 2012-10-18 DIAGNOSIS — Z96649 Presence of unspecified artificial hip joint: Secondary | ICD-10-CM | POA: Insufficient documentation

## 2012-10-18 DIAGNOSIS — IMO0001 Reserved for inherently not codable concepts without codable children: Secondary | ICD-10-CM | POA: Insufficient documentation

## 2012-10-18 DIAGNOSIS — R262 Difficulty in walking, not elsewhere classified: Secondary | ICD-10-CM | POA: Insufficient documentation

## 2012-10-23 ENCOUNTER — Ambulatory Visit: Payer: 59 | Admitting: Rehabilitation

## 2012-10-25 ENCOUNTER — Ambulatory Visit: Payer: 59 | Admitting: Rehabilitation

## 2012-10-30 ENCOUNTER — Ambulatory Visit: Payer: 59 | Admitting: Rehabilitation

## 2012-11-01 ENCOUNTER — Ambulatory Visit: Payer: 59 | Admitting: Rehabilitation

## 2012-11-06 ENCOUNTER — Telehealth: Payer: Self-pay | Admitting: *Deleted

## 2012-11-06 NOTE — Telephone Encounter (Signed)
Pt was provided samples until next appt. PG

## 2012-11-06 NOTE — Telephone Encounter (Signed)
Refill for 2 rx's  Till her appt next week . zerelto and bystolic.call patient if you can not give her these to last till then

## 2012-11-08 ENCOUNTER — Ambulatory Visit: Payer: 59 | Admitting: Rehabilitation

## 2012-11-13 ENCOUNTER — Encounter: Payer: 59 | Admitting: Rehabilitation

## 2012-11-15 ENCOUNTER — Encounter: Payer: 59 | Admitting: Rehabilitation

## 2012-11-20 ENCOUNTER — Encounter: Payer: 59 | Admitting: Rehabilitation

## 2012-11-22 ENCOUNTER — Encounter: Payer: 59 | Admitting: Rehabilitation

## 2012-11-27 ENCOUNTER — Encounter: Payer: Self-pay | Admitting: Family Medicine

## 2012-11-27 ENCOUNTER — Ambulatory Visit (INDEPENDENT_AMBULATORY_CARE_PROVIDER_SITE_OTHER): Payer: 59 | Admitting: Family Medicine

## 2012-11-27 VITALS — BP 137/84 | HR 93 | Wt 258.0 lb

## 2012-11-27 DIAGNOSIS — R51 Headache: Secondary | ICD-10-CM

## 2012-11-27 DIAGNOSIS — I4891 Unspecified atrial fibrillation: Secondary | ICD-10-CM

## 2012-11-27 DIAGNOSIS — M7989 Other specified soft tissue disorders: Secondary | ICD-10-CM

## 2012-11-27 DIAGNOSIS — I10 Essential (primary) hypertension: Secondary | ICD-10-CM

## 2012-11-27 DIAGNOSIS — E876 Hypokalemia: Secondary | ICD-10-CM

## 2012-11-27 MED ORDER — NEBIVOLOL HCL 20 MG PO TABS: ORAL_TABLET | ORAL | Status: DC

## 2012-11-27 MED ORDER — METOLAZONE 5 MG PO TABS: 5.0000 mg | ORAL_TABLET | Freq: Every day | ORAL | Status: DC

## 2012-11-27 MED ORDER — RIVAROXABAN 20 MG PO TABS: 20.0000 mg | ORAL_TABLET | Freq: Every day | ORAL | Status: DC

## 2012-11-27 MED ORDER — POTASSIUM CHLORIDE CRYS ER 10 MEQ PO TBCR: 10.0000 meq | EXTENDED_RELEASE_TABLET | Freq: Three times a day (TID) | ORAL | Status: DC

## 2012-11-27 NOTE — Patient Instructions (Addendum)
1)  BP - Increase your Bystolic to 20 mg per day and hold the HCT.    2)  Leg Swelling - Change the HCT to Zaroxolyn 5 mg per day.  Take the potassium up to 3 times per day.  3)  Atrial Fibrillation - Stay on the Xarelto 20 mg daily.  4)  Headaches/Diet - Start on the Trokendi XR 25 then 50 mg then 100 mg.  Let us know what dosage you need.    5)  Diet - Alternate between the 3 day diet and the Liver Shrinking Diet and then one day per week with 1 day per week off the diets.    Diet Following Bariatric Surgery The bariatric diet is designed to provide fluids and nourishment while promoting weight loss after bariatric surgery. The diet is divided into 3 stages. The rate of progression varies based on individual food tolerance. DIET FOLLOWING BARIATRIC SURGERY The diet following surgery is divided into 3 stages to allow a gradual adjustment. It is very important to the success of your surgery to:  Progress to each stage slowly.  Eat at set times.  Chew food well and stop eating when you are full.  Not drink liquids 30 minutes before and after meals. If you feel tightness or pressure in your chest, that means you are full. Wait 30 minutes before you try to eat again. STAGE 1 BARIATRIC DIET - ABOUT 2 WEEKS IN DURATION   The diet begins the day of surgery. It will last about 1 to 2 weeks after surgery. Your surgeon may have individual guidelines for you about specific foods or the progression of your diet. Follow your surgeon's guidelines.  If clear liquids are well-tolerated without vomiting, your caregiver will add a 4 oz to 6 oz high protein, low-calorie liquid supplement. You could add this to your meal plan 3 times daily. You will need at least 60 g to 80 g of protein daily or as determined by your Registered Dietitian.  Guidelines for choosing a protein supplement include:  At least 15 g of protein per 8 oz serving.  Less than 20 g total carbohydrate per 8 oz serving.  Less than  5 g fat per 8 oz serving.  Avoid carbonated beverages, caffeine, alcohol, and concentrated sweets such as sugar, cakes, and cookies.  Right after surgery, you may only be able to eat 3 to 4 tsp per meal. Your maximum volume should not exceed  to  cup total. Do not eat or drink more than 1 oz or 2 tbs every 15 minutes.  Take a chewable multivitamin and mineral supplement.  Drink at least 48 oz of fluid daily, which includes your protein supplement. Food and beverages from the list below are allowed at set times (for example at 8 AM, 12 noon, or 5 PM):  Decaffeinated coffee or tea.  100% fruit juice.  Diet or sugar-free drinks.  Broth.  Blenderized soup.  Skim milk or lactose-free milk.  Sugar-free gelatin dessert or frozen ice pops.  Mashed potatoes.  Yogurt (artificially sweetened).  Sugar-free pudding.  Blended low-fat cottage cheese.  Unsweetened applesauce, grits, or hot wheat cereal. Four to six ounces of a liquid protein supplement from the list below is recommended for snacks at 10 AM, 2 PM, and 8 PM.  STAGE 2 BARIATRIC DIET (SOFT DIET) - ABOUT 4 WEEKS IN DURATION  About 2 weeks after surgery, your caregiver will progress your diet to this stage. Foods may need to be blended  to the consistency of applesauce. Choose low-fat foods (less than 5 g of fat per serving) and avoid concentrated sweets and sugar (less than 10 g of sugar per serving). Meals should not exceed  to  cup total. This stage will last about 4 weeks. It is recommended that you meet with your dietitian at this stage to begin preparation for the last stage. This stage consists of 3 meals a day with a liquid protein supplement between meals twice daily. Do not drink liquids with foods. You must wait 30 minutes for the stomach pouch to empty before drinking. Chew food well. The food must be almost liquified before swallowing. Soft foods from the list below can now be slowly added to your diet:  Soft fruit  (soft canned fruit in light syrup or natural juice, banana, melon, peaches, pears, or strawberries).  Cooked vegetables.  Toast or crackers (becomes soft after chewing 20 times).  Hot wheat cereal.  Fish.  Eggs (scrambled, soft-boiled). STAGE 3 BARIATRIC DIET (REGULAR DIET) - ABOUT 6 to 8 WEEKS AFTER SURGERY About 6 to 8 weeks after surgery, you will be advanced to food that is regular in texture. This diet should include all food groups. The diet will continue to promote weight loss. Meals should not exceed  to 1 cup total. Your dietitian will be available to assist you in meal planning and additional behavioral strategies to make this final stage a long-term success. Slowly add foods of regular consistency and remember:  Eat only at your chosen meal times.  Minimize drinking with meals. You should drink 30 minutes before eating. Do not start drinking again for about 2 hours after eating.  Chew food well. Take small bites.  Think about the portion size of a healthy frozen meal. You will be able to eat most of this.  Make sure your meal is balanced with starch, protein, fruits, and vegetables.  When you feel full, stop eating. Document Released: 02/06/2003 Document Revised: 10/25/2011 Document Reviewed: 10/30/2010 [facility] Patient Information 2013 [place], [place].

## 2012-11-29 ENCOUNTER — Encounter: Payer: Self-pay | Admitting: Family Medicine

## 2012-11-29 NOTE — Progress Notes (Signed)
  Subjective:    Patient ID: Monica [name], female    DOB: 1956/05/01, 57 y.o.   MRN: [id number]  HPI:  Hadiyah is here today for medication refills and to discuss the medical conditions listed below.    1) Hypertension - She is doing fine on the combination of Bystolic and HCTZ.  She had an episode of chest discomfort and would like to have an x-ray.  2)  Leg Swelling - She has been having increased swelling in her ankles.  She has not been watching her sodium intake.    3)  Atrial Fibrillation - She is doing fine on the Xarelto.  She does not have a follow up with [facility].      Review of Systems  Constitutional: Negative for fatigue.  Respiratory: Negative for shortness of breath.   Cardiovascular: Positive for chest pain, palpitations and leg swelling.  Gastrointestinal: Negative for abdominal pain.  Endocrine: Negative for polyuria.  Genitourinary: Negative for difficulty urinating.  Musculoskeletal: Positive for myalgias and arthralgias.  Neurological: Positive for headaches.       Objective:   Physical Exam  Constitutional: She is oriented to person, place, and time. Vital signs are normal. She appears well-nourished. No distress.  HENT:  Head: Normocephalic.  Eyes: Conjunctivae are normal.  Neck: Normal range of motion. Neck supple. No thyromegaly present.  Cardiovascular: An irregular rhythm present.  Pulmonary/Chest: Effort normal and breath sounds normal.  Musculoskeletal: She exhibits edema and tenderness.  Lymphadenopathy:    She has no cervical adenopathy.  Neurological: She is alert and oriented to person, place, and time.  Skin: Skin is warm and dry. No rash noted.  Psychiatric: She has a normal mood and affect. Her behavior is normal. Judgment and thought content normal.          Assessment & Plan:    1)  Hypertension - Increasing her Bystolic to 20 mg from 10 mg since we're changing her diuretic from HCT to Zaroxlyn.    2)  Atrial  Fibrillation - She is scheduled to see Dr. Jens Som in a few weeks.  Her Xarelto was refilled.      3)  Leg Swelling - She is going to try some Zaroxyln.    4)  Hypokalemia - She was given a prescription for K-Dur to take TID.      5)  Headaches/Diet - She will start on the Trokendi XR 25 and then increase to 50 mg then 100 mg if tolerated.  She will let us know which dosage she wants .   6)  Diet - Alternate between the 3 day diet and the Liver Shrinking Diet.  7)  Chest Pain - Her EKG does not show anything new from her last EKG.

## 2012-11-30 ENCOUNTER — Telehealth: Payer: Self-pay | Admitting: *Deleted

## 2012-11-30 DIAGNOSIS — R51 Headache: Secondary | ICD-10-CM | POA: Insufficient documentation

## 2012-11-30 DIAGNOSIS — I10 Essential (primary) hypertension: Secondary | ICD-10-CM

## 2012-11-30 DIAGNOSIS — E876 Hypokalemia: Secondary | ICD-10-CM | POA: Insufficient documentation

## 2012-11-30 DIAGNOSIS — M7989 Other specified soft tissue disorders: Secondary | ICD-10-CM | POA: Insufficient documentation

## 2012-11-30 DIAGNOSIS — R519 Headache, unspecified: Secondary | ICD-10-CM | POA: Insufficient documentation

## 2012-11-30 HISTORY — DX: Headache: R51

## 2012-11-30 HISTORY — DX: Other specified soft tissue disorders: M79.89

## 2012-11-30 HISTORY — DX: Essential (primary) hypertension: I10

## 2012-11-30 NOTE — Telephone Encounter (Signed)
Pt decided to start taking only half dose of her diuretic to avoid overnight cramps.  She will contact our office if her leg cramps worsen. PG

## 2012-11-30 NOTE — Telephone Encounter (Signed)
PT WAS IN LAST WEEK DR. ZANARD GAVE RX FOR FLUID PILL PT WANTED TO KNOW IF SHE COULD CUT PILL IN HALF- PILL IS WORKING BUT PT HAVING HAVE LEG CRAMPS - PHONE NUMBER TO CALL - [phone]

## 2012-12-04 ENCOUNTER — Encounter: Payer: Self-pay | Admitting: *Deleted

## 2012-12-14 ENCOUNTER — Telehealth: Payer: Self-pay | Admitting: *Deleted

## 2012-12-14 NOTE — Telephone Encounter (Signed)
Dr Alberteen Sam can you transmit a prescription for her for her Trokendrixr 50 mg (you gave her samples to try).  She called to let you know she did well with it. Thanks PG

## 2012-12-14 NOTE — Telephone Encounter (Signed)
PT CALLED AND SAID THAT DR. ZANARD HAD GIVEN TWO DIFFERENT MG FOR AN RX (TROKENDIXR) TO TRY TO SEE WHICH ONE WORKED THE BEST FOR HER AND SHE ADVISED THAT THIS ONE DOES AT THE 50MG  DOSAGE. CALL PT IF YOU HAVE ANY QUESTIONS.

## 2012-12-15 ENCOUNTER — Other Ambulatory Visit: Payer: Self-pay | Admitting: Family Medicine

## 2012-12-15 MED ORDER — TOPIRAMATE ER 50 MG PO CAP24: 1.0000 | ORAL_CAPSULE | Freq: Every day | ORAL | Status: DC

## 2012-12-15 NOTE — Telephone Encounter (Signed)
I sent in the refill.  Make sure that she has the co-pay card to bring the cost down to $15.

## 2012-12-20 ENCOUNTER — Telehealth: Payer: Self-pay | Admitting: *Deleted

## 2012-12-20 ENCOUNTER — Encounter: Payer: Self-pay | Admitting: Cardiology

## 2012-12-20 ENCOUNTER — Ambulatory Visit (INDEPENDENT_AMBULATORY_CARE_PROVIDER_SITE_OTHER): Payer: 59 | Admitting: Cardiology

## 2012-12-20 VITALS — BP 120/82 | HR 72 | Wt 254.0 lb

## 2012-12-20 DIAGNOSIS — R609 Edema, unspecified: Secondary | ICD-10-CM

## 2012-12-20 DIAGNOSIS — I4891 Unspecified atrial fibrillation: Secondary | ICD-10-CM

## 2012-12-20 DIAGNOSIS — I10 Essential (primary) hypertension: Secondary | ICD-10-CM

## 2012-12-20 LAB — CBC
Hemoglobin: 12.7 g/dL (ref 12.0–15.0)
MCH: 28.5 pg (ref 26.0–34.0)
MCHC: 35.9 g/dL (ref 30.0–36.0)
Platelets: 340 10*3/uL (ref 150–400)
RBC: 4.46 MIL/uL (ref 3.87–5.11)

## 2012-12-20 LAB — TSH: TSH: 1.283 u[IU]/mL (ref 0.350–4.500)

## 2012-12-20 NOTE — Telephone Encounter (Signed)
Ben the pharm md in the [facility] [place] wants to make sure dr Jens Som wants to continue the pt on trokendi, due to its cardiac side effects. He also questioned caffeine intake, he reports the pt drinks a lot of coffee. Will forward for dr Jens Som review

## 2012-12-20 NOTE — Assessment & Plan Note (Signed)
Continue present medications. 

## 2012-12-20 NOTE — Telephone Encounter (Signed)
Spoke with Monica [name], Aware of dr Ludwig Clarks recommendations.

## 2012-12-20 NOTE — Patient Instructions (Addendum)
Your physician recommends that you schedule a follow-up appointment in: 8 WEEKS WITH DR CRENSHAW IN [place]  Your physician recommends that you HAVE LAB WORK TODAY

## 2012-12-20 NOTE — Telephone Encounter (Signed)
Management of trokendi per primary care; no cardiac contraindication. Monica [name]

## 2012-12-20 NOTE — Assessment & Plan Note (Signed)
Patient has been started on metolazone for lower extremity edema. I will check her sodium, potassium and renal function.

## 2012-12-20 NOTE — Progress Notes (Signed)
HPI: 57 year-old female for followup of atrial fibrillation. Previously seen by Dr. Swaziland in November of 2013 for atrial fibrillation. Echocardiogram in November 2013 showed an ejection fraction of 50-55%, moderate left atrial enlargement, mild right atrial enlargement and mildly elevated pulmonary pressures. When she was seen previously the decision was made for rate control and anticoagulation. Since she was last seen, she notes dyspnea with climbing stairs but otherwise denies dyspnea. No orthopnea, PND, chest pain, palpitations or syncope. Occasional mild lightheadedness. Her diuretics were recently changed. She has been placed on Zaroxolyn for pedal edema and this has improved.  Current Outpatient Prescriptions  Medication Sig Dispense Refill  . celecoxib (CELEBREX) 200 MG capsule Take 200 mg by mouth as needed.       . clobetasol cream (TEMOVATE) 0.05 % Apply topically as needed.      . desonide (DESOWEN) 0.05 % lotion Apply to rash on face or under breasts twice daily for 7 to 10 days  59 mL  0  . etanercept (ENBREL) 50 MG/ML injection Inject 50 mg into the skin once a week.      . metolazone (ZAROXOLYN) 5 MG tablet Take 1 tablet (5 mg total) by mouth daily.  30 tablet  11  . Multiple Vitamins-Minerals (MULTIVITAMIN WITH MINERALS) tablet Take 1 tablet by mouth daily with supper.      . Nebivolol HCl (BYSTOLIC) 20 MG TABS Take 1 tablet by mouth daily.      . potassium chloride (K-DUR,KLOR-CON) 10 MEQ tablet Take 1 tablet (10 mEq total) by mouth 3 (three) times daily.  90 tablet  11  . Rivaroxaban (XARELTO) 20 MG TABS Take 1 tablet (20 mg total) by mouth daily.  30 tablet  5  . Topiramate ER (TROKENDI XR) 50 MG CP24 Take 1 capsule by mouth daily.  30 capsule  11  . Vitamin D, Ergocalciferol, (DRISDOL) 50000 UNITS CAPS Take 50,000 Units by mouth 2 (two) times a week.       No current facility-administered medications for this visit.     Past Medical History  Diagnosis Date  . Atrial  fibrillation   . Right hip pain   . Hypertension   . Morbid obesity   . Psoriasis     active breakout left buttocks    Past Surgical History  Procedure Laterality Date  . Fracture surgery  07/21/12    right hip arthroplasty  . 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 Marital Status: Divorced    Spouse Name: N/A    Number of Children: 1  . 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 Smoking status: Former Smoker    Types: Cigarettes    Quit date: 09/13/1982  . Smokeless tobacco: Not on file  . Alcohol Use: Yes     Comment: occassionally  . Drug Use: No  . Sexually Active: Not on file   Other Topics Concern  . Not on file   Social History Narrative  . No narrative on file    ROS: no fevers or chills, productive cough, hemoptysis, dysphasia, odynophagia, melena, hematochezia, dysuria, hematuria, rash, seizure activity, orthopnea, PND, pedal edema, claudication. Remaining systems are negative.  Physical Exam: Well-developed obese  in no acute distress.  Skin is warm and dry.  HEENT is normal.  Neck is supple.  Chest is clear to auscultation with normal expansion.  Cardiovascular exam is irregular Abdominal exam nontender or distended. No masses palpated. Extremities show trace edema. neuro grossly intact  ECG atrial fibrillation at a rate of 72. Occasional PVC or aberrantly conducted beat. Nonspecific ST changes.

## 2012-12-20 NOTE — Assessment & Plan Note (Addendum)
Patient remains in atrial fibrillation today. Her rate appears to be controlled. She has embolic risk factors of hypertension and female sex. We will continue with xeralto. Check hemoglobin, TSH and renal function. Long discussion today concerning options of therapy. We discussed rate control/anticoagulation versus rhythm control. She has some dyspnea with climbing stairs and feels it may be related to her atrial fibrillation. However she is somewhat hesitant to proceed with cardioversion at this point. She would like to consider her options. I will see her back in 8 weeks. The best option may be to proceed with cardioversion to see if she will hold sinus rhythm in to see if this improves her symptoms. If so we would lean toward rhythm control. Otherwise we will continue with rate control and anticoagulation. I have explained that the longer she is in atrial fibrillation the more difficult it will be to maintain sinus rhythm or reestablished sinus rhythm.

## 2012-12-21 LAB — BASIC METABOLIC PANEL WITH GFR
Calcium: 9.6 mg/dL (ref 8.4–10.5)
GFR, Est African American: >89 mL/min
GFR, Est Non African American: 81 mL/min
Glucose, Bld: 77 mg/dL (ref 70–99)
Potassium: 3.5 mEq/L (ref 3.5–5.3)
Sodium: 139 mEq/L (ref 135–145)

## 2012-12-26 ENCOUNTER — Telehealth: Payer: Self-pay | Admitting: Cardiology

## 2012-12-26 NOTE — Telephone Encounter (Addendum)
Spoke with pt, she had a lot of questions regarding atrial fib and treatment options. I mailed her the pamphlet regarding cardioversion for her to read. She wants to go ahead and schedule the DCCV on 01-09-13 Will confirm with dr Jens Som okay to schedule and the pt wants to make sure there are no other options. Will call her back once I talk to dr Jens Som. Pt agreed with this plan.

## 2012-12-26 NOTE — Telephone Encounter (Signed)
New problem    On the day of visit was in Afib .    is there a reason why Cardizem was not suggestion versus  procedure - cardioversion.

## 2012-12-27 NOTE — Telephone Encounter (Signed)
Left message for pt to call, per dr Jens Som we need to confirm when the xarelto was started and that she has not missed any doses. If she has been on the xarelto for 21 days with no misses we are clear to schedule the DCCV.

## 2013-01-02 ENCOUNTER — Telehealth: Payer: Self-pay | Admitting: Family Medicine

## 2013-01-02 ENCOUNTER — Other Ambulatory Visit: Payer: Self-pay | Admitting: Cardiology

## 2013-01-02 ENCOUNTER — Encounter: Payer: Self-pay | Admitting: *Deleted

## 2013-01-02 NOTE — Telephone Encounter (Signed)
Spoke with pt, she confirms she has not missed a dose of xarelto since 10-13-12. Will schedule DCCV and call the pt to discuss.

## 2013-01-02 NOTE — Telephone Encounter (Signed)
PATIENT IS HAVING CARDIOVERSION ON Tuesday, MAY 27

## 2013-01-02 NOTE — Telephone Encounter (Signed)
Spoke with pt, scheduled for DCCV 01-09-13 @ 2 pm. Instructions discussed over the phone and letter mailed to pt home address.

## 2013-01-09 ENCOUNTER — Encounter (HOSPITAL_COMMUNITY): Payer: Self-pay | Admitting: Certified Registered Nurse Anesthetist

## 2013-01-09 ENCOUNTER — Encounter (HOSPITAL_COMMUNITY)
Admission: RE | Disposition: A | Discharge status: Home / Self Care | Payer: Self-pay | Source: Ambulatory Visit | Attending: Cardiology

## 2013-01-09 ENCOUNTER — Ambulatory Visit (HOSPITAL_COMMUNITY)
Admission: RE | Admit: 2013-01-09 | Discharge: 2013-01-09 | Disposition: A | Discharge status: Home / Self Care | Payer: 59 | Source: Ambulatory Visit | Attending: Cardiology | Admitting: Cardiology

## 2013-01-09 ENCOUNTER — Encounter (HOSPITAL_COMMUNITY): Payer: Self-pay | Admitting: *Deleted

## 2013-01-09 ENCOUNTER — Ambulatory Visit (HOSPITAL_COMMUNITY): Payer: 59 | Admitting: Certified Registered Nurse Anesthetist

## 2013-01-09 DIAGNOSIS — R609 Edema, unspecified: Secondary | ICD-10-CM | POA: Insufficient documentation

## 2013-01-09 DIAGNOSIS — I10 Essential (primary) hypertension: Secondary | ICD-10-CM | POA: Insufficient documentation

## 2013-01-09 DIAGNOSIS — Z79899 Other long term (current) drug therapy: Secondary | ICD-10-CM | POA: Insufficient documentation

## 2013-01-09 DIAGNOSIS — L408 Other psoriasis: Secondary | ICD-10-CM | POA: Insufficient documentation

## 2013-01-09 DIAGNOSIS — I517 Cardiomegaly: Secondary | ICD-10-CM | POA: Insufficient documentation

## 2013-01-09 DIAGNOSIS — Z6841 Body Mass Index (BMI) 40.0 and over, adult: Secondary | ICD-10-CM | POA: Insufficient documentation

## 2013-01-09 DIAGNOSIS — I4891 Unspecified atrial fibrillation: Secondary | ICD-10-CM | POA: Insufficient documentation

## 2013-01-09 DIAGNOSIS — Z7901 Long term (current) use of anticoagulants: Secondary | ICD-10-CM | POA: Insufficient documentation

## 2013-01-09 DIAGNOSIS — Z96649 Presence of unspecified artificial hip joint: Secondary | ICD-10-CM | POA: Insufficient documentation

## 2013-01-09 DIAGNOSIS — Z87891 Personal history of nicotine dependence: Secondary | ICD-10-CM | POA: Insufficient documentation

## 2013-01-09 HISTORY — PX: CARDIOVERSION: SHX1299

## 2013-01-09 HISTORY — DX: Unspecified osteoarthritis, unspecified site: M19.90

## 2013-01-09 HISTORY — DX: Shortness of breath: R06.02

## 2013-01-09 SURGERY — CARDIOVERSION
Anesthesia: Monitor Anesthesia Care

## 2013-01-09 MED ORDER — LIDOCAINE HCL (CARDIAC) 20 MG/ML IV SOLN
INTRAVENOUS | Status: DC | PRN
  Administered 2013-01-09: 40 mg via INTRAVENOUS

## 2013-01-09 MED ORDER — PROPOFOL 10 MG/ML IV BOLUS
INTRAVENOUS | Status: DC | PRN
  Administered 2013-01-09: 55 mg via INTRAVENOUS

## 2013-01-09 NOTE — Transfer of Care (Signed)
Immediate Anesthesia Transfer of Care Note  Patient: Monica [name]  Procedure(s) Performed: Procedure(s): CARDIOVERSION (N/A)  Patient Location: Endoscopy Unit  Anesthesia Type:General  Level of Consciousness: awake, alert  and oriented  Airway & Oxygen Therapy: Patient Spontanous Breathing  Post-op Assessment: Report given to PACU RN and Post -op Vital signs reviewed and stable  Post vital signs: Reviewed and stable  Complications: No apparent anesthesia complications

## 2013-01-09 NOTE — H&P (Signed)
[facility] [place]  12/20/2012 11:00 AM   Office Visit  MRN:  [id number]   Description: 57 year old female  Provider: Lewayne Bunting, MD  Department: [facility]        Referring Provider    Gillian Scarce, MD      Diagnoses    Atrial fibrillation    -  Primary    427.31    Essential hypertension, benign        401.1    Edema        782.3      Reason for Visit    Follow-up    Atrial fibrillation       Reason For Visit History Recorded         Progress Notes    Lewayne Bunting, MD at 12/20/2012 12:09 PM    Status: Signed                      HPI: 57 year-old female for followup of atrial fibrillation. Previously seen by Dr. Swaziland in November of 2013 for atrial fibrillation. Echocardiogram in November 2013 showed an ejection fraction of 50-55%, moderate left atrial enlargement, mild right atrial enlargement and mildly elevated pulmonary pressures. When she was seen previously the decision was made for rate control and anticoagulation. Since she was last seen, she notes dyspnea with climbing stairs but otherwise denies dyspnea. No orthopnea, PND, chest pain, palpitations or syncope. Occasional mild lightheadedness. Her diuretics were recently changed. She has been placed on Zaroxolyn for pedal edema and this has improved.    Current Outpatient Prescriptions   Medication  Sig  Dispense  Refill   .  celecoxib (CELEBREX) 200 MG capsule  Take 200 mg by mouth as needed.          .  clobetasol cream (TEMOVATE) 0.05 %  Apply topically as needed.         .  desonide (DESOWEN) 0.05 % lotion  Apply to rash on face or under breasts twice daily for 7 to 10 days   59 mL   0   .  etanercept (ENBREL) 50 MG/ML injection  Inject 50 mg into the skin once a week.         .  metolazone (ZAROXOLYN) 5 MG tablet  Take 1 tablet (5 mg total) by mouth daily.   30 tablet   11   .  Multiple Vitamins-Minerals (MULTIVITAMIN WITH MINERALS) tablet  Take 1 tablet by mouth daily with supper.          .  Nebivolol HCl (BYSTOLIC) 20 MG TABS  Take 1 tablet by mouth daily.         .  potassium chloride (K-DUR,KLOR-CON) 10 MEQ tablet  Take 1 tablet (10 mEq total) by mouth 3 (three) times daily.   90 tablet   11   .  Rivaroxaban (XARELTO) 20 MG TABS  Take 1 tablet (20 mg total) by mouth daily.   30 tablet   5   .  Topiramate ER (TROKENDI XR) 50 MG CP24  Take 1 capsule by mouth daily.   30 capsule   11   .  Vitamin D, Ergocalciferol, (DRISDOL) 50000 UNITS CAPS  Take 50,000 Units by mouth 2 (two) times a week.             No current facility-administered medications for this visit.           Past Medical History  Diagnosis  Date   .  Atrial fibrillation     .  Right hip pain     .  Hypertension     .  Morbid obesity     .  Psoriasis         active breakout left buttocks         Past Surgical History   Procedure  Laterality  Date   .  Fracture surgery    07/21/12       right hip arthroplasty   .  Total hip arthroplasty    07/21/2012       Procedure: TOTAL HIP ARTHROPLASTY;  Surgeon: Loanne Drilling, MD;  Location: [facility];  Service: Orthopedics;  Laterality: Right;         History       Social History   .  Marital Status:  Divorced       Spouse Name:  N/A       Number of Children:  1   .  Years of Education:  N/A       Occupational History   .  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  Smoking status:  Former Smoker       Types:  Cigarettes       Quit date:  09/13/1982   .  Smokeless tobacco:  Not on file   .  Alcohol Use:  Yes         Comment: occassionally   .  Drug Use:  No   .  Sexually Active:  Not on file       Other Topics  Concern   .  Not on file       Social History Narrative   .  No narrative on file        ROS: no fevers or chills, productive cough, hemoptysis, dysphasia, odynophagia, melena, hematochezia, dysuria, hematuria, rash, seizure activity, orthopnea, PND, pedal edema, claudication.  Remaining systems are negative.   Physical Exam: Well-developed obese in no acute distress.   Skin is warm and dry.   HEENT is normal.   Neck is supple.   Chest is clear to auscultation with normal expansion.   Cardiovascular exam is irregular Abdominal exam nontender or distended. No masses palpated. Extremities show trace edema. neuro grossly intact   ECG atrial fibrillation at a rate of 72. Occasional PVC or aberrantly conducted beat. Nonspecific ST changes.                  Atrial fibrillation - Lewayne Bunting, MD at 12/20/2012 11:46 AM    Status: Edited Related Problem: Atrial fibrillation           Patient remains in atrial fibrillation today. Her rate appears to be controlled. She has embolic risk factors of hypertension and female sex. We will continue with xeralto. Check hemoglobin, TSH and renal function. Long discussion today concerning options of therapy. We discussed rate control/anticoagulation versus rhythm control. She has some dyspnea with climbing stairs and feels it may be related to her atrial fibrillation. However she is somewhat hesitant to proceed with cardioversion at this point. She would like to consider her options. I will see her back in 8 weeks. The best option may be to proceed with cardioversion to see if she will hold sinus rhythm in to see if this improves her symptoms. If  so we would lean toward rhythm control. Otherwise we will continue with rate control and anticoagulation. I have explained that the longer she is in atrial fibrillation the more difficult it will be to maintain sinus rhythm or reestablished sinus rhythm.           Revision History       Date/Time User Action    > 12/20/2012 11:46 AM Lewayne Bunting, MD Edit      12/20/2012 11:44 AM Lewayne Bunting, MD Create              Essential hypertension, benign - Lewayne Bunting, MD at 12/20/2012 11:44 AM    Status: Written Related Problem: Essential hypertension, benign            Continue present medications.         Edema - Lewayne Bunting, MD at 12/20/2012 11:45 AM    Status: Written Related Problem: Edema           Patient has been started on metolazone for lower extremity edema. I will check her sodium, potassium and renal function.       Patient for DCCV of atrial fibrillation; no changes. Olga Millers

## 2013-01-09 NOTE — Procedures (Signed)
Electrical Cardioversion Procedure Note Monica [name] [id number] [birth year]  Procedure: Electrical Cardioversion Indications:  Atrial Fibrillation  Procedure Details Consent: Risks of procedure as well as the alternatives and risks of each were explained to the (patient/caregiver).  Consent for procedure obtained. Time Out: Verified patient identification, verified procedure, site/side was marked, verified correct patient position, special equipment/implants available, medications/allergies/relevent history reviewed, required imaging and test results available.  Performed  Patient placed on cardiac monitor, pulse oximetry, supplemental oxygen as necessary.  Sedation given: Diprovan 55 mg and 40 lidocaine mg IV administered by anesthesia. Pacer pads placed anterior and posterior chest.  Cardioverted 1 time(s).  Cardioverted at 120J.  Evaluation Findings: Post procedure EKG shows: NSR Complications: None Patient did tolerate procedure well.   Monica [name] 01/09/2013, 1:35 PM

## 2013-01-09 NOTE — Preoperative (Signed)
Beta Blockers   Reason not to administer Beta Blockers:Not Applicable 

## 2013-01-09 NOTE — Anesthesia Postprocedure Evaluation (Signed)
  Anesthesia Post-op Note  Patient: Monica [name]  Procedure(s) Performed: Procedure(s): CARDIOVERSION (N/A)  Patient Location: Endoscopy Unit  Anesthesia Type:General  Level of Consciousness: awake, alert  and oriented  Airway and Oxygen Therapy: Patient Spontanous Breathing  Post-op Pain: none  Post-op Assessment: Post-op Vital signs reviewed, Patient's Cardiovascular Status Stable, Respiratory Function Stable and Patent Airway  Post-op Vital Signs: Reviewed and stable  Complications: No apparent anesthesia complications

## 2013-01-09 NOTE — Anesthesia Preprocedure Evaluation (Signed)
Anesthesia Evaluation  Patient identified by MRN, date of birth, ID band Patient awake    Reviewed: Allergy & Precautions, H&P , NPO status , Patient's Chart, lab work & pertinent test results  Airway Mallampati: I TM Distance: >3 FB Neck ROM: full    Dental   Pulmonary          Cardiovascular hypertension, + dysrhythmias Atrial Fibrillation Rhythm:regular Rate:Normal     Neuro/Psych  Headaches,    GI/Hepatic   Endo/Other    Renal/GU      Musculoskeletal   Abdominal   Peds  Hematology   Anesthesia Other Findings   Reproductive/Obstetrics                           Anesthesia Physical Anesthesia Plan  ASA: III  Anesthesia Plan: General   Post-op Pain Management:    Induction: Intravenous  Airway Management Planned: Mask  Additional Equipment:   Intra-op Plan:   Post-operative Plan:   Informed Consent: I have reviewed the patients History and Physical, chart, labs and discussed the procedure including the risks, benefits and alternatives for the proposed anesthesia with the patient or authorized representative who has indicated his/her understanding and acceptance.     Plan Discussed with: CRNA, Anesthesiologist and Surgeon  Anesthesia Plan Comments:         Anesthesia Quick Evaluation

## 2013-01-09 NOTE — Anesthesia Postprocedure Evaluation (Signed)
  Anesthesia Post-op Note  Patient: Monica [name]  Procedure(s) Performed: Procedure(s): CARDIOVERSION (N/A)  Patient Location: PACU  Anesthesia Type:General  Level of Consciousness: awake, oriented and patient cooperative  Airway and Oxygen Therapy: Patient Spontanous Breathing  Post-op Pain: none  Post-op Assessment: Post-op Vital signs reviewed, Patient's Cardiovascular Status Stable, Respiratory Function Stable, Patent Airway and Pain level controlled  Post-op Vital Signs: stable  Complications: No apparent anesthesia complications

## 2013-01-10 ENCOUNTER — Encounter (HOSPITAL_COMMUNITY): Payer: Self-pay | Admitting: Cardiology

## 2013-01-17 ENCOUNTER — Ambulatory Visit: Payer: 59 | Admitting: Cardiology

## 2013-02-01 ENCOUNTER — Telehealth: Payer: Self-pay | Admitting: Cardiology

## 2013-02-01 DIAGNOSIS — M7989 Other specified soft tissue disorders: Secondary | ICD-10-CM

## 2013-02-01 MED ORDER — METOLAZONE 5 MG PO TABS: 2.5000 mg | ORAL_TABLET | Freq: Every day | ORAL | Status: DC

## 2013-02-01 NOTE — Telephone Encounter (Signed)
Spoke with pt, starting this week she has been having lightheadedness, SOB and feeling faint when she stands from sitting. She has had her pulse checked at work and feels she is in normal rhythm. She will have someone check her bp and pulse sitting and standing at work and then call me back.

## 2013-02-01 NOTE — Telephone Encounter (Signed)
Spoke with pt, she reports a bp of 132/74 with pulse of 65 sitting and 124/75 with pulse of 67 standing and then after 3 min her bp was 115/69 with pulse 64. She feels she is drinking plenty of fluids. Her meds were confirmed. Will discuss with dr Jens Som.

## 2013-02-01 NOTE — Telephone Encounter (Signed)
New Problem:    Patient called in because she is 8 weeks post Cardioversion and is experiencing inconsistent intermittent SOB, lightheadedness and faint.  Patient is concerned and would like to be seen by Dr. Jens Som soon.  Patient stated that she did not wish to go to the ER and has an appointment to see her PCP soon.  Please call back.

## 2013-02-01 NOTE — Telephone Encounter (Signed)
Discussed with dr Jens Som, pt instructed to curt metolazone to 2.5 mg once daily. She will make sure to drink plenty of fluids. She will call if symptoms do not improve. Pt agreed with this plan.

## 2013-02-02 ENCOUNTER — Ambulatory Visit (INDEPENDENT_AMBULATORY_CARE_PROVIDER_SITE_OTHER): Payer: 59 | Admitting: Family Medicine

## 2013-02-02 ENCOUNTER — Other Ambulatory Visit: Payer: Self-pay | Admitting: Family Medicine

## 2013-02-02 ENCOUNTER — Encounter: Payer: Self-pay | Admitting: Family Medicine

## 2013-02-02 VITALS — BP 110/69 | HR 72 | Temp 98.1°F | Wt 257.0 lb

## 2013-02-02 DIAGNOSIS — R42 Dizziness and giddiness: Secondary | ICD-10-CM

## 2013-02-02 DIAGNOSIS — M7989 Other specified soft tissue disorders: Secondary | ICD-10-CM

## 2013-02-02 DIAGNOSIS — R609 Edema, unspecified: Secondary | ICD-10-CM

## 2013-02-02 DIAGNOSIS — R0602 Shortness of breath: Secondary | ICD-10-CM

## 2013-02-02 MED ORDER — FUROSEMIDE 40 MG PO TABS: 40.0000 mg | ORAL_TABLET | Freq: Every day | ORAL | Status: DC

## 2013-02-02 NOTE — Patient Instructions (Addendum)
1)  BP/Dizziness - Since your BP is low today hold the Bystolic another day and then start on the 5 mg tomorrow.  Check your BP on Monday.  If it is fine then stay on the 5 mg. 1/4 of 20 mg.   2)  Leg Swelling - Try the Lasix to see if this will improve your swelling.    Edema Edema is an abnormal build-up of fluids in tissues. Because this is partly dependent on gravity (water flows to the lowest place), it is more common in the legs and thighs (lower extremities). It is also common in the looser tissues, like around the eyes. Painless swelling of the feet and ankles is common and increases as a person ages. It may affect both legs and may include the calves or even thighs. When squeezed, the fluid may move out of the affected area and may leave a dent for a few moments. CAUSES   Prolonged standing or sitting in one place for extended periods of time. Movement helps pump tissue fluid into the veins, and absence of movement prevents this, resulting in edema.  Varicose veins. The valves in the veins do not work as well as they should. This causes fluid to leak into the tissues.  Fluid and salt overload.  Injury, burn, or surgery to the leg, ankle, or foot, may damage veins and allow fluid to leak out.  Sunburn damages vessels. Leaky vessels allow fluid to go out into the sunburned tissues.  Allergies (from insect bites or stings, medications or chemicals) cause swelling by allowing vessels to become leaky.  Protein in the blood helps keep fluid in your vessels. Low protein, as in malnutrition, allows fluid to leak out.  Hormonal changes, including pregnancy and menstruation, cause fluid retention. This fluid may leak out of vessels and cause edema.  Medications that cause fluid retention. Examples are sex hormones, blood pressure medications, steroid treatment, or anti-depressants.  Some illnesses cause edema, especially heart failure, kidney disease, or liver disease.  Surgery that cuts  veins or lymph nodes, such as surgery done for the heart or for breast cancer, may result in edema. DIAGNOSIS  Your caregiver is usually easily able to determine what is causing your swelling (edema) by simply asking what is wrong (getting a history) and examining you (doing a physical). Sometimes x-rays, EKG (electrocardiogram or heart tracing), and blood work may be done to evaluate for underlying medical illness. TREATMENT  General treatment includes:  Leg elevation (or elevation of the affected body part).  Restriction of fluid intake.  Prevention of fluid overload.  Compression of the affected body part. Compression with elastic bandages or support stockings squeezes the tissues, preventing fluid from entering and forcing it back into the blood vessels.  Diuretics (also called water pills or fluid pills) pull fluid out of your body in the form of increased urination. These are effective in reducing the swelling, but can have side effects and must be used only under your caregiver's supervision. Diuretics are appropriate only for some types of edema. The specific treatment can be directed at any underlying causes discovered. Heart, liver, or kidney disease should be treated appropriately. HOME CARE INSTRUCTIONS   Elevate the legs (or affected body part) above the level of the heart, while lying down.  Avoid sitting or standing still for prolonged periods of time.  Avoid putting anything directly under the knees when lying down, and do not wear constricting clothing or garters on the upper legs.  Exercising  the legs causes the fluid to work back into the veins and lymphatic channels. This may help the swelling go down.  The pressure applied by elastic bandages or support stockings can help reduce ankle swelling.  A low-salt diet may help reduce fluid retention and decrease the ankle swelling.  Take any medications exactly as prescribed. SEEK MEDICAL CARE IF:  Your edema is not  responding to recommended treatments. SEEK IMMEDIATE MEDICAL CARE IF:   You develop shortness of breath or chest pain.  You cannot breathe when you lay down; or if, while lying down, you have to get up and go to the window to get your breath.  You are having increasing swelling without relief from treatment.  You develop a fever over 102 F (38.9 C).  You develop pain or redness in the areas that are swollen.  Tell your caregiver right away if you have gained 3 lb/1.4 kg in 1 day or 5 lb/2.3 kg in a week. MAKE SURE YOU:   Understand these instructions.  Will watch your condition.  Will get help right away if you are not doing well or get worse. Document Released: 08/02/2005 Document Revised: 02/01/2012 Document Reviewed: 03/20/2008 [facility] Patient Information 2014 [place], [place].

## 2013-02-07 ENCOUNTER — Telehealth: Payer: Self-pay | Admitting: Cardiology

## 2013-02-07 NOTE — Telephone Encounter (Signed)
Spoke with pt, she is having occ indigestion type feeling in her chest. She was seen by her PCP this week and all her meds were changed. She was in sinus rhythm when seen by her PCP. The discomfort is described as a tight feeling in her chest that seems to get worse at night.  Belching helps ease the feeling and if she props her head up at night it also helps with this feeling. Explained to pt it sounds like reflux. She will try an over the counter PPI twice daily for the next week to help with the tightness. She will call if symptoms do not improve or change. Pt agreed with this plan.

## 2013-02-07 NOTE — Telephone Encounter (Signed)
New problem  Pt said she is having a little bit of a hard time breathing and some discomfort in her chest. She said she is at work in [employer] building below the office.

## 2013-02-21 ENCOUNTER — Ambulatory Visit (INDEPENDENT_AMBULATORY_CARE_PROVIDER_SITE_OTHER): Payer: 59 | Admitting: Cardiology

## 2013-02-21 ENCOUNTER — Encounter: Payer: Self-pay | Admitting: Cardiology

## 2013-02-21 VITALS — BP 146/84 | HR 64 | Wt 262.0 lb

## 2013-02-21 DIAGNOSIS — I10 Essential (primary) hypertension: Secondary | ICD-10-CM

## 2013-02-21 DIAGNOSIS — R609 Edema, unspecified: Secondary | ICD-10-CM

## 2013-02-21 DIAGNOSIS — I4891 Unspecified atrial fibrillation: Secondary | ICD-10-CM

## 2013-02-21 NOTE — Assessment & Plan Note (Signed)
Blood pressure controlled. Continue present medications. 

## 2013-02-21 NOTE — Assessment & Plan Note (Signed)
Continue present dose of Lasix. 

## 2013-02-21 NOTE — Progress Notes (Signed)
HPI: Pleasant female for followup of atrial fibrillation. Previously seen by Dr. [place] in November of 2013 for atrial fibrillation. Echocardiogram in November 2013 showed an ejection fraction of 50-55%, moderate left atrial enlargement, mild right atrial enlargement and mildly elevated pulmonary pressures. Patient underwent cardioversion on 01/09/2013 to sinus rhythm. TSH in May of 2014 1.283. Since her cardioversion, her energy is much better. She denies dyspnea on exertion, orthopnea, chest pain or syncope. She was having orthostatic symptoms but her blood pressure medication was reduced and this is better. She has chronic pedal edema.  Current Outpatient Prescriptions  Medication Sig Dispense Refill  . celecoxib (CELEBREX) 200 MG capsule Take 200 mg by mouth as needed.       . clobetasol cream (TEMOVATE) 0.05 % Apply topically as needed.      . desonide (DESOWEN) 0.05 % lotion Apply to rash on face or under breasts twice daily for 7 to 10 days  59 mL  0  . etanercept (ENBREL) 50 MG/ML injection Inject 50 mg into the skin once a week.      . furosemide (LASIX) 40 MG tablet Take 1 tablet (40 mg total) by mouth daily.  30 tablet  0  . Multiple Vitamins-Minerals (MULTIVITAMIN WITH MINERALS) tablet Take 1 tablet by mouth daily with supper.      . nebivolol (BYSTOLIC) 5 MG tablet Take 5 mg by mouth daily.      . potassium chloride (K-DUR,KLOR-CON) 10 MEQ tablet Take 10 mEq by mouth daily.      . Rivaroxaban (XARELTO) 20 MG TABS Take 1 tablet (20 mg total) by mouth daily.  30 tablet  5  . Vitamin D, Ergocalciferol, (DRISDOL) 50000 UNITS CAPS Take 50,000 Units by mouth 2 (two) times a week.       No current facility-administered medications for this visit.     Past Medical History  Diagnosis Date  . Atrial fibrillation   . Right hip pain   . Hypertension   . Morbid obesity   . Psoriasis     active breakout left buttocks  . Shortness of breath   . Osteoarthritis     Past Surgical  History  Procedure Laterality Date  . Total hip arthroplasty  07/21/2012    Procedure: TOTAL HIP ARTHROPLASTY;  Surgeon: Loanne Drilling, MD;  Location: [facility] ORS;  Service: Orthopedics;  Laterality: Right;  . Fracture surgery  07/21/12    right hip arthroplasty  . Cardioversion N/A 01/09/2013    Procedure: CARDIOVERSION;  Surgeon: Lewayne Bunting, MD;  Location: [facility] ENDOSCOPY;  Service: Cardiovascular;  Laterality: N/A;    History   Social History  . Marital Status: Divorced    Spouse Name: N/A    Number of Children: 1  . Years of Education: N/A   Occupational History  . ED admissions [employer]   Social History Main Topics  . Smoking status: Former Smoker    Types: Cigarettes    Quit date: 09/13/1982  . Smokeless tobacco: Not on file  . Alcohol Use: Yes     Comment: occassionally  . Drug Use: No  . Sexually Active: Not on file   Other Topics Concern  . Not on file   Social History Narrative  . No narrative on file    ROS: no fevers or chills, productive cough, hemoptysis, dysphasia, odynophagia, melena, hematochezia, dysuria, hematuria, rash, seizure activity, orthopnea, PND, pedal edema, claudication. Remaining systems are negative.  Physical Exam:  Well-developed well-nourished in no acute distress.  Skin is warm and dry.  HEENT is normal.  Neck is supple.  Chest is clear to auscultation with normal expansion.  Cardiovascular exam is regular rate and rhythm.  Abdominal exam nontender or distended. No masses palpated. Extremities show 1+ edema. neuro grossly intact  ECG sinus rhythm at a rate of 64. Nonspecific ST changes.

## 2013-02-21 NOTE — Patient Instructions (Addendum)
Your physician wants you to follow-up in: 6 MONTHS WITH DR CRENSHAW You will receive a reminder letter in the mail two months in advance. If you don't receive a letter, please call our office to schedule the follow-up appointment.  

## 2013-02-21 NOTE — Assessment & Plan Note (Signed)
Patient continues to be in sinus rhythm. Her symptoms are improved including improved energy. If she has recurrent episodes in the future we will consider an antiarrhythmic. Continue beta blocker. Continue xeralto. She has embolic risk factors of hypertension and female sex.

## 2013-03-05 ENCOUNTER — Other Ambulatory Visit: Payer: Self-pay | Admitting: Family Medicine

## 2013-03-11 ENCOUNTER — Encounter: Payer: Self-pay | Admitting: Family Medicine

## 2013-03-11 DIAGNOSIS — R0602 Shortness of breath: Secondary | ICD-10-CM | POA: Insufficient documentation

## 2013-03-11 DIAGNOSIS — R42 Dizziness and giddiness: Secondary | ICD-10-CM | POA: Insufficient documentation

## 2013-03-11 NOTE — Assessment & Plan Note (Signed)
She is to restart her Lasix by taking 1/2 of her previous dosage.

## 2013-03-11 NOTE — Progress Notes (Signed)
  Subjective:    Patient ID: Monica [name], female    DOB: 08-01-1956, 57 y.o.   MRN: [id number]  HPI  Vandy presents today with dizziness and SOB. She says that her symptoms are worse upon standing. She called her cardiologist's office and was told to decrease her diuretic and to follow up with her PCP. She has also been holding her Bystolic.     Review of Systems  Constitutional: Negative for fatigue and unexpected weight change.  HENT: Negative for ear pain and tinnitus.   Eyes: Negative for visual disturbance.  Respiratory: Positive for shortness of breath. Negative for chest tightness.   Cardiovascular: Positive for leg swelling. Negative for chest pain and palpitations.  Genitourinary: Negative.   Neurological: Positive for dizziness.  Psychiatric/Behavioral: Negative.    Past Medical History  Diagnosis Date  . Atrial fibrillation   . Right hip pain   . Hypertension   . Morbid obesity   . Psoriasis     active breakout left buttocks  . Shortness of breath   . Osteoarthritis    Family History  Problem Relation Age of Onset  . Hypertension Mother   . Diabetes Mother   . Alzheimer's disease Mother   . 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    EducationEngineer, petroleum    Tobacco Use/Exposure: Former Smoker.  She used to smoke 4 cigarettes per day for five years and quit 32 years ago.    Alcohol Use:  Occasional   Drug Use:  None   Diet:  Regular   Exercise:  Water Aerobics (2 x per week)     Hobbies: Shopping, Cycling               Objective:   Physical Exam  Constitutional: She appears well-nourished. No distress.  HENT:  Head: Normocephalic.  Eyes: No scleral icterus.  Neck: No thyromegaly present.  Cardiovascular: Normal rate,  regular rhythm and normal heart sounds.   Pulmonary/Chest: Effort normal and breath sounds normal.  Abdominal: There is no tenderness.  Musculoskeletal: She exhibits no edema and no tenderness.  Neurological: She is alert.  Skin: Skin is warm and dry.  Psychiatric: She has a normal mood and affect. Her behavior is normal. Judgment and thought content normal.          Assessment & Plan:

## 2013-03-11 NOTE — Assessment & Plan Note (Addendum)
Checked an EKG which was normal so she was given reassurance that she is not back in atrial fibrillation. Her pressure is on the low side so she is to hold her Bystolic till tomorrow to see if this improves her symptoms.  When she starts back, she is to take just 5 mg.  She has a follow up visit with her cardiologist (Dr. Jens Som).

## 2013-03-11 NOTE — Assessment & Plan Note (Signed)
She is having some SOB with activity.  I think that this is most likely due to her being "out of shape".  She was encouraged to continue exercising and hopefully her SOB will improve.

## 2013-04-02 ENCOUNTER — Ambulatory Visit: Payer: 59 | Admitting: Family Medicine

## 2013-04-09 ENCOUNTER — Encounter: Payer: Self-pay | Admitting: Family Medicine

## 2013-04-09 ENCOUNTER — Ambulatory Visit (INDEPENDENT_AMBULATORY_CARE_PROVIDER_SITE_OTHER): Payer: 59 | Admitting: Family Medicine

## 2013-04-09 VITALS — BP 123/73 | HR 72 | Resp 16 | Wt 263.0 lb

## 2013-04-09 DIAGNOSIS — I10 Essential (primary) hypertension: Secondary | ICD-10-CM

## 2013-04-09 DIAGNOSIS — M7989 Other specified soft tissue disorders: Secondary | ICD-10-CM

## 2013-04-09 DIAGNOSIS — M549 Dorsalgia, unspecified: Secondary | ICD-10-CM

## 2013-04-09 DIAGNOSIS — R51 Headache: Secondary | ICD-10-CM

## 2013-04-09 MED ORDER — METHYLPREDNISOLONE SODIUM SUCC 125 MG IJ SOLR
125.0000 mg | Freq: Once | INTRAMUSCULAR | Status: AC
  Administered 2013-04-09: 125 mg via INTRAMUSCULAR

## 2013-04-09 MED ORDER — BUTALBITAL-ACETAMINOPHEN 50-300 MG PO TABS: 2.0000 | ORAL_TABLET | Freq: Two times a day (BID) | ORAL | Status: AC

## 2013-04-09 MED ORDER — FUROSEMIDE 40 MG PO TABS: 40.0000 mg | ORAL_TABLET | Freq: Every day | ORAL | Status: DC

## 2013-04-09 MED ORDER — NEBIVOLOL HCL 10 MG PO TABS: 10.0000 mg | ORAL_TABLET | Freq: Every day | ORAL | Status: DC

## 2013-04-09 NOTE — Patient Instructions (Addendum)
1)  Back Pain - Chiropractic Adjustment/Back Magic; You received a steroid injection today.  Hot bath - soak in Epsom Salt.      Back Exercises Back exercises help treat and prevent back injuries. The goal is to increase your strength in your belly (abdominal) and back muscles. These exercises can also help with flexibility. Start these exercises when told by your doctor. HOME CARE Back exercises include: Pelvic Tilt.  Lie on your back with your knees bent. Tilt your pelvis until the lower part of your back is against the floor. Hold this position 5 to 10 sec. Repeat this exercise 5 to 10 times. Knee to Chest.  Pull 1 knee up against your chest and hold for 20 to 30 seconds. Repeat this with the other knee. This may be done with the other leg straight or bent, whichever feels better. Then, pull both knees up against your chest. Sit-Ups or Curl-Ups.  Bend your knees 90 degrees. Start with tilting your pelvis, and do a partial, slow sit-up. Only lift your upper half 30 to 45 degrees off the floor. Take at least 2 to 3 seonds for each sit-up. Do not do sit-ups with your knees out straight. If partial sit-ups are difficult, simply do the above but with only tightening your belly (abdominal) muscles and holding it as told. Hip-Lift.  Lie on your back with your knees flexed 90 degrees. Push down with your feet and shoulders as you raise your hips 2 inches off the floor. Hold for 10 seconds, repeat 5 to 10 times. Back Arches.  Lie on your stomach. Prop yourself up on bent elbows. Slowly press on your hands, causing an arch in your low back. Repeat 3 to 5 times. Shoulder-Lifts.  Lie face down with arms beside your body. Keep hips and belly pressed to floor as you slowly lift your head and shoulders off the floor. Do not overdo your exercises. Be careful in the beginning. Exercises may cause you some mild back discomfort. If the pain lasts for more than 15 minutes, stop the exercises until you see  your doctor. Improvement with exercise for back problems is slow.  Document Released: 09/04/2010 Document Revised: 10/25/2011 Document Reviewed: 06/03/2011 [facility] Patient Information 2014 [place], [place].

## 2013-04-09 NOTE — Progress Notes (Signed)
  Subjective:    Patient ID: Monica [name], female    DOB: 27-Apr-1956, 57 y.o.   MRN: [id number]  HPI  Tamrah is here today to discuss the conditions listed below:  1)  Edema:  She takes furosemide along with potassium for her leg swelling. She has been out of it for a few days.    2)  Headaches:  She usually gets headache during her menstrual cycles.  She has been taking Excedrin but is concerned about the interaction with her Xarelto.    3)  Back Pain:  She has been having pain in her lower back for several weeks.  She feels that her symptoms may be related to her job and her posture.    Review of Systems  Constitutional: Negative.   Eyes: Negative.   Respiratory: Negative.   Cardiovascular: Negative.   Gastrointestinal: Negative.   Endocrine: Negative.   Genitourinary: Negative.   Musculoskeletal: Positive for back pain.  Skin: Negative.   Allergic/Immunologic: Negative.   Neurological: Positive for headaches.       During her menstrual cycles  Hematological: Negative.   Psychiatric/Behavioral: Negative.     Past Medical History  Diagnosis Date  . Atrial fibrillation   . Right hip pain   . Hypertension   . Morbid obesity   . Psoriasis     active breakout left buttocks  . Shortness of breath   . Osteoarthritis   . Avascular necrosis of bone of right hip   . Tachycardia, unspecified     Family History  Problem Relation Age of Onset  . Hypertension Mother   . Diabetes Mother   . Alzheimer's disease Mother   . Thyroid disease Mother   . 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  She used to smoke 4 cigarettes per day for five years and quit 32 years ago.    Alcohol Use:   Occasional   Drug Use:  None   Diet:  Regular   Exercise:  Water Aerobics (2 x per week)     Hobbies: Shopping, Cycling             Objective:   Physical Exam  Vitals reviewed. Constitutional: She is oriented to person, place, and time. She appears well-developed and well-nourished. No distress.  Eyes: Conjunctivae are normal. No scleral icterus.  Neck: Neck supple. No thyromegaly present.  Cardiovascular: Normal rate, regular rhythm and normal heart sounds.   Pulmonary/Chest: Effort normal and breath sounds normal.  Musculoskeletal: She exhibits no edema and no tenderness.  Lymphadenopathy:    She has no cervical adenopathy.  Neurological: She is alert and oriented to person, place, and time.  Skin: Skin is warm and dry.  Psychiatric: She has a normal mood and affect. Her behavior is normal. Judgment and thought content normal.      Assessment & Plan:

## 2013-05-20 ENCOUNTER — Encounter: Payer: Self-pay | Admitting: Family Medicine

## 2013-06-05 DIAGNOSIS — M549 Dorsalgia, unspecified: Secondary | ICD-10-CM | POA: Insufficient documentation

## 2013-06-05 HISTORY — DX: Dorsalgia, unspecified: M54.9

## 2013-06-05 NOTE — Assessment & Plan Note (Signed)
Refilled her Bystolic.

## 2013-06-05 NOTE — Assessment & Plan Note (Addendum)
She received an injection of Solu-Medrol.  She was encouraged to get a Back Magic vs consider getting a chiropractic adjustment.

## 2013-06-05 NOTE — Assessment & Plan Note (Signed)
She was reminded that she should not take Excedrin since she is on Xarelto.  She was given a prescription for Bupap.

## 2013-06-05 NOTE — Assessment & Plan Note (Signed)
Refilled her Lasix.   

## 2013-06-20 ENCOUNTER — Encounter: Payer: Self-pay | Admitting: Family Medicine

## 2013-06-20 ENCOUNTER — Ambulatory Visit (INDEPENDENT_AMBULATORY_CARE_PROVIDER_SITE_OTHER): Payer: 59 | Admitting: Family Medicine

## 2013-06-20 VITALS — BP 150/83 | HR 70 | Temp 98.7°F | Resp 16 | Ht 65.5 in | Wt 268.0 lb

## 2013-06-20 DIAGNOSIS — J309 Allergic rhinitis, unspecified: Secondary | ICD-10-CM | POA: Insufficient documentation

## 2013-06-20 DIAGNOSIS — R05 Cough: Secondary | ICD-10-CM | POA: Insufficient documentation

## 2013-06-20 DIAGNOSIS — R059 Cough, unspecified: Secondary | ICD-10-CM

## 2013-06-20 HISTORY — DX: Cough, unspecified: R05.9

## 2013-06-20 MED ORDER — BENZONATATE 200 MG PO CAPS: 200.0000 mg | ORAL_CAPSULE | Freq: Three times a day (TID) | ORAL | Status: DC | PRN

## 2013-06-20 MED ORDER — FLUTICASONE PROPIONATE 50 MCG/ACT NA SUSP: 2.0000 | Freq: Every day | NASAL | Status: DC

## 2013-06-20 MED ORDER — HYDROCOD POLST-CHLORPHEN POLST 10-8 MG/5ML PO LQCR: 5.0000 mL | Freq: Two times a day (BID) | ORAL | Status: DC | PRN

## 2013-06-20 NOTE — Assessment & Plan Note (Signed)
She was given a prescription for Flonase and was encouraged to use the [facility] Med Sinus Rinse.

## 2013-06-20 NOTE — Progress Notes (Signed)
  Subjective:    Patient ID: Monica [name], female    DOB: [birth year], 57 y.o.   MRN: [id number]  Monica [name] is here today complaining of a cough.  She is traveling next week for her "destination wedding" at [facility] and she wants to be well before her trip.     Cough This is a new problem. The current episode started 1 to 4 weeks ago. The problem has been gradually worsening. The problem occurs constantly. The cough is productive of sputum. Associated symptoms include nasal congestion. Pertinent negatives include no chest pain, fever, sore throat or shortness of breath. Nothing aggravates the symptoms. She has tried OTC cough suppressant for the symptoms. The treatment provided no relief.    Review of Systems  Constitutional: Negative for fever.  HENT: Positive for congestion. Negative for sore throat.   Eyes: Negative.   Respiratory: Positive for cough. Negative for shortness of breath.   Cardiovascular: Negative.  Negative for chest pain.  Gastrointestinal: Negative.   Endocrine: Negative.   Genitourinary: Negative.   Musculoskeletal: Negative.   Allergic/Immunologic: Negative.   Neurological: Negative.   Hematological: Negative.   Psychiatric/Behavioral: Negative.      Past Medical History  Diagnosis Date  . Atrial fibrillation   . Right hip pain   . Hypertension   . Morbid obesity   . Psoriasis     active breakout left buttocks  . Shortness of breath   . Osteoarthritis   . Avascular necrosis of bone of right hip   . Tachycardia, unspecified      Family History  Problem Relation Age of Onset  . Hypertension Mother   . Diabetes Mother   . Alzheimer's disease Mother   . Thyroid disease Mother   . 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    EducationWater quality scientist    Tobacco Use/Exposure: Former Smoker.  She used to smoke 4 cigarettes per day for five years and quit 32 years ago.    Alcohol Use:  Occasional   Drug Use:  None   Diet:  Regular   Exercise:  Water Aerobics (2 x per week)     Hobbies: Shopping, Cycling             Objective:   Physical Exam  Nursing note and vitals reviewed. Constitutional: She appears well-developed and well-nourished.  HENT:  Head: Normocephalic.  Eyes: Pupils are equal, round, and reactive to light.  Neck: Normal range of motion.  Cardiovascular: Normal rate.   Pulmonary/Chest: Effort normal and breath sounds normal. No respiratory distress. She has no wheezes. She exhibits no tenderness.  Musculoskeletal: Normal range of motion.  Neurological: She is alert.  Skin: Skin is warm and dry.  Psychiatric: She has a normal mood and affect. Her behavior is normal. Judgment and thought content normal.     Assessment & Plan:

## 2013-06-20 NOTE — Patient Instructions (Addendum)
1)  Cough  Mucinex DM 1200/60 (1 pill 2 x per day) Tessalon Perles (1 capsule 3 xper day) Tussionex (1 tsp 2 x per day)  Umcka Cold Care 2 droppers 3-4 x per day Vick's Vapor Rub  Ricola Honey Lemon w Echinacea                                                                            2) Head Congestion  Flonase  Neil Med Sinus Rinse Microbiologist with Distilled Water)

## 2013-06-20 NOTE — Assessment & Plan Note (Signed)
She was given prescriptions for cough meds and was encouraged to get some [facility].

## 2013-06-29 ENCOUNTER — Other Ambulatory Visit: Payer: Self-pay | Admitting: Family Medicine

## 2013-06-29 ENCOUNTER — Telehealth: Payer: Self-pay | Admitting: *Deleted

## 2013-06-29 MED ORDER — VITAMIN D (ERGOCALCIFEROL) 1.25 MG (50000 UNIT) PO CAPS: 50000.0000 [IU] | ORAL_CAPSULE | ORAL | Status: DC

## 2013-06-29 NOTE — Telephone Encounter (Signed)
Can you refill her Vit. D without an appt? She was just seen last week.  She did not bring in her med bottles.

## 2013-07-05 ENCOUNTER — Other Ambulatory Visit: Payer: Self-pay | Admitting: Family Medicine

## 2013-07-05 ENCOUNTER — Other Ambulatory Visit: Payer: Self-pay | Admitting: *Deleted

## 2013-07-05 MED ORDER — VITAMIN D (ERGOCALCIFEROL) 1.25 MG (50000 UNIT) PO CAPS: 50000.0000 [IU] | ORAL_CAPSULE | ORAL | Status: DC

## 2013-07-18 ENCOUNTER — Telehealth: Payer: Self-pay | Admitting: *Deleted

## 2013-07-18 ENCOUNTER — Other Ambulatory Visit: Payer: Self-pay | Admitting: Family Medicine

## 2013-07-18 NOTE — Telephone Encounter (Signed)
Monica [name] says you would like a Vit D result before you see her again.  She is schedule for labs on Monday.  Would you like more than just a Vit D?

## 2013-07-19 NOTE — Telephone Encounter (Signed)
Monica [name] needs to come in for a CPE.  In looking at her chart, It appears as if I have only seen her for problems.  We checked a FSH last year to see if she was in menopause and she was not.  See if she is still having periods.  If not does she want labs to confirm that she is in menopause?  If so, we can check a FSH/LH in addition to a CMET and Vitamin D.

## 2013-07-20 ENCOUNTER — Other Ambulatory Visit: Payer: Self-pay | Admitting: *Deleted

## 2013-07-20 DIAGNOSIS — E785 Hyperlipidemia, unspecified: Secondary | ICD-10-CM

## 2013-07-20 DIAGNOSIS — E559 Vitamin D deficiency, unspecified: Secondary | ICD-10-CM

## 2013-07-20 DIAGNOSIS — R5381 Other malaise: Secondary | ICD-10-CM

## 2013-07-20 MED ORDER — CELECOXIB 200 MG PO CAPS: 200.0000 mg | ORAL_CAPSULE | Freq: Two times a day (BID) | ORAL | Status: DC

## 2013-07-23 ENCOUNTER — Other Ambulatory Visit: Payer: 59

## 2013-07-24 LAB — LIPID PANEL
Cholesterol: 149 mg/dL (ref 0–200)
HDL: 42 mg/dL (ref >39)
LDL Cholesterol: 87 mg/dL (ref 0–99)
Total CHOL/HDL Ratio: 3.5 Ratio
Triglycerides: 101 mg/dL (ref <150)
VLDL: 20 mg/dL (ref 0–40)

## 2013-07-24 LAB — COMPLETE METABOLIC PANEL WITH GFR
ALT: 19 U/L (ref 0–35)
AST: 15 U/L (ref 0–37)
Albumin: 4.4 g/dL (ref 3.5–5.2)
Alkaline Phosphatase: 99 U/L (ref 39–117)
BUN: 10 mg/dL (ref 6–23)
CO2: 26 mEq/L (ref 19–32)
Calcium: 9.4 mg/dL (ref 8.4–10.5)
Chloride: 103 mEq/L (ref 96–112)
Creat: 0.83 mg/dL (ref 0.50–1.10)
GFR, Est African American: >89 mL/min
GFR, Est Non African American: 80 mL/min
Glucose, Bld: 115 mg/dL — ABNORMAL HIGH (ref 70–99)
Potassium: 4.3 mEq/L (ref 3.5–5.3)
Sodium: 138 mEq/L (ref 135–145)
Total Bilirubin: 0.3 mg/dL (ref 0.3–1.2)
Total Protein: 8 g/dL (ref 6.0–8.3)

## 2013-07-24 LAB — CBC WITH DIFFERENTIAL/PLATELET
Basophils Absolute: 0 10*3/uL (ref 0.0–0.1)
Basophils Relative: 0 % (ref 0–1)
Eosinophils Absolute: 0.2 10*3/uL (ref 0.0–0.7)
Eosinophils Relative: 4 % (ref 0–5)
HCT: 36.9 % (ref 36.0–46.0)
Hemoglobin: 12.4 g/dL (ref 12.0–15.0)
Lymphocytes Relative: 51 % — ABNORMAL HIGH (ref 12–46)
Lymphs Abs: 3.2 10*3/uL (ref 0.7–4.0)
MCH: 28.9 pg (ref 26.0–34.0)
MCHC: 33.6 g/dL (ref 30.0–36.0)
MCV: 86 fL (ref 78.0–100.0)
Monocytes Absolute: 0.6 10*3/uL (ref 0.1–1.0)
Monocytes Relative: 9 % (ref 3–12)
Neutro Abs: 2.3 10*3/uL (ref 1.7–7.7)
Neutrophils Relative %: 36 % — ABNORMAL LOW (ref 43–77)
Platelets: 313 10*3/uL (ref 150–400)
RBC: 4.29 MIL/uL (ref 3.87–5.11)
RDW: 13.9 % (ref 11.5–15.5)
WBC: 6.3 10*3/uL (ref 4.0–10.5)

## 2013-07-24 LAB — TSH: TSH: 1.341 u[IU]/mL (ref 0.350–4.500)

## 2013-07-25 LAB — VITAMIN D 25 HYDROXY (VIT D DEFICIENCY, FRACTURES): Vit D, 25-Hydroxy: 54 ng/mL (ref 30–89)

## 2013-07-26 ENCOUNTER — Encounter: Payer: Self-pay | Admitting: Family Medicine

## 2013-07-26 ENCOUNTER — Ambulatory Visit (INDEPENDENT_AMBULATORY_CARE_PROVIDER_SITE_OTHER): Payer: 59 | Admitting: Family Medicine

## 2013-07-26 ENCOUNTER — Encounter (INDEPENDENT_AMBULATORY_CARE_PROVIDER_SITE_OTHER): Payer: Self-pay

## 2013-07-26 VITALS — BP 128/74 | HR 73 | Resp 16 | Wt 268.0 lb

## 2013-07-26 DIAGNOSIS — B3731 Acute candidiasis of vulva and vagina: Secondary | ICD-10-CM

## 2013-07-26 DIAGNOSIS — Z1211 Encounter for screening for malignant neoplasm of colon: Secondary | ICD-10-CM

## 2013-07-26 DIAGNOSIS — B373 Candidiasis of vulva and vagina: Secondary | ICD-10-CM

## 2013-07-26 DIAGNOSIS — R7301 Impaired fasting glucose: Secondary | ICD-10-CM

## 2013-07-26 DIAGNOSIS — L089 Local infection of the skin and subcutaneous tissue, unspecified: Secondary | ICD-10-CM

## 2013-07-26 LAB — POCT GLYCOSYLATED HEMOGLOBIN (HGB A1C): Hemoglobin A1C: 6

## 2013-07-26 MED ORDER — KETOCONAZOLE 2 % EX CREA: 1.0000 "application " | TOPICAL_CREAM | Freq: Two times a day (BID) | CUTANEOUS | Status: DC

## 2013-07-26 MED ORDER — AZITHROMYCIN 500 MG PO TABS
ORAL_TABLET | ORAL | Status: DC
Start: 2013-07-26 — End: 2013-09-12

## 2013-07-26 MED ORDER — FLUCONAZOLE 200 MG PO TABS: 200.0000 mg | ORAL_TABLET | Freq: Every day | ORAL | Status: DC

## 2013-07-26 MED ORDER — TERCONAZOLE 0.8 % VA CREA: 1.0000 | TOPICAL_CREAM | Freq: Every day | VAGINAL | Status: DC

## 2013-07-26 NOTE — Progress Notes (Signed)
Subjective:    Patient ID: Monica [name], female    DOB: 1956-07-25, 57 y.o.   MRN: [id number]  HPI  Monica [name] is here today to go over her most recent lab results and to discuss the condition listed below:   1)  Skin Rash:  She is concerned about the skin on her toes.  It itches and is discolored.  She first noticed the problem a couple of weeks ago.  She thinks that the problem started as an ingrown toenail.  The problem has spread to several toes.  She continues to have edema around her ankles.   She has tried applying her psoriasis creams to the affected area but she has not seen improvements.      Review of Systems  Cardiovascular: Positive for leg swelling.  Skin:       Black skin in her toes (bilateratelly)     Past Medical History  Diagnosis Date  . Atrial fibrillation   . Right hip pain   . Hypertension   . Morbid obesity   . Psoriasis     active breakout left buttocks  . Shortness of breath   . Osteoarthritis   . Avascular necrosis of bone of right hip   . Tachycardia, unspecified      Past Surgical History  Procedure Laterality Date  . Total hip arthroplasty  07/21/2012    Procedure: TOTAL HIP ARTHROPLASTY;  Surgeon: Loanne Drilling, MD;  Location: [facility];  Service: Orthopedics;  Laterality: Right;  . Fracture surgery  07/21/12    right hip arthroplasty  . 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    EducationEngineer, petroleum    Tobacco Use/Exposure: Former Smoker.  She used to smoke 4 cigarettes per day for five years and quit 32 years ago.    Alcohol Use:  Occasional   Drug Use:  None   Diet:   Regular   Exercise:  Water Aerobics (2 x per week)     Hobbies: Shopping, Cycling               Family History  Problem Relation Age of Onset  . Hypertension Mother   . Diabetes Mother   . Alzheimer's disease Mother   . Thyroid disease Mother   . Diabetes Son      Current Outpatient Prescriptions on File Prior to Visit  Medication Sig Dispense Refill  . Butalbital-Acetaminophen (BUPAP) 50-300 MG TABS Take 2 tablets by mouth 2 (two) times daily. Take only occasionally for severe headaches.  60 tablet  1  . clobetasol cream (TEMOVATE) 0.05 % Apply topically as needed.      . desonide (DESOWEN) 0.05 % lotion Apply to rash on face or under breasts twice daily for 7 to 10 days  59 mL  0  . etanercept (ENBREL) 50 MG/ML injection Inject 50 mg into the skin once a week.      . fluticasone (FLONASE) 50 MCG/ACT nasal spray Place 2 sprays into both nostrils daily.  16 g  11  . furosemide (LASIX) 40 MG tablet Take 1 tablet (40 mg  total) by mouth daily.  90 tablet  3  . Multiple Vitamins-Minerals (MULTIVITAMIN WITH MINERALS) tablet Take 1 tablet by mouth daily with supper.      . nebivolol (BYSTOLIC) 10 MG tablet Take 1 tablet (10 mg total) by mouth daily.  30 tablet  5  . potassium chloride (K-DUR,KLOR-CON) 10 MEQ tablet Take 10 mEq by mouth daily.      . Vitamin D, Ergocalciferol, (DRISDOL) 50000 UNITS CAPS capsule Take 1 capsule (50,000 Units total) by mouth 2 (two) times a week.  8 capsule  11   No current facility-administered medications on file prior to visit.     No Known Allergies       Objective:   Physical Exam  Vitals reviewed. Constitutional: She is oriented to person, place, and time.  Eyes: Conjunctivae are normal. No scleral icterus.  Neck: Neck supple. No thyromegaly present.  Cardiovascular: Normal rate, regular rhythm and normal heart sounds.   Pulmonary/Chest: Effort normal and breath sounds normal.  Musculoskeletal: She exhibits no edema and no tenderness.   Lymphadenopathy:    She has no cervical adenopathy.  Neurological: She is alert and oriented to person, place, and time.  Skin: Skin is warm and dry. Rash (Toes) noted.  Psychiatric: She has a normal mood and affect. Her behavior is normal. Judgment and thought content normal.      Assessment & Plan:    Monica [name] was seen today for medication management.  Diagnoses and associated orders for this visit:  Skin infection - azithromycin (ZITHROMAX) 500 MG tablet; Take 1 tablet daily for 3 days. - ketoconazole (NIZORAL) 2 % cream; Apply 1 application topically 2 (two) times daily.  Candidiasis of female genitalia -     fluconazole (DIFLUCAN) 200 MG tablet; Take 1 tablet (200 mg total) by mouth daily. -     terconazole (TERAZOL 3) 0.8 % vaginal cream; Place 1 applicator vaginally at bedtime. Take for 3 days  Colon cancer screening - Ambulatory referral to Gastroenterology  IFG (impaired fasting glucose) - POCT glycosylated hemoglobin (Hb A1C) (6.0%)

## 2013-08-06 ENCOUNTER — Other Ambulatory Visit: Payer: Self-pay | Admitting: Family Medicine

## 2013-08-06 ENCOUNTER — Ambulatory Visit (HOSPITAL_BASED_OUTPATIENT_CLINIC_OR_DEPARTMENT_OTHER)
Admission: RE | Admit: 2013-08-06 | Discharge: 2013-08-06 | Disposition: A | Discharge status: Home / Self Care | Payer: 59 | Source: Ambulatory Visit | Attending: Family Medicine | Admitting: Family Medicine

## 2013-08-06 DIAGNOSIS — Z1231 Encounter for screening mammogram for malignant neoplasm of breast: Secondary | ICD-10-CM

## 2013-08-17 ENCOUNTER — Other Ambulatory Visit: Payer: Self-pay | Admitting: Family Medicine

## 2013-08-23 ENCOUNTER — Telehealth: Payer: Self-pay | Admitting: Cardiology

## 2013-08-23 MED ORDER — XARELTO 20 MG PO TABS: 20.0000 mg | ORAL_TABLET | Freq: Every day | ORAL | Status: DC

## 2013-08-23 NOTE — Telephone Encounter (Signed)
New problem   Pt is out of Xarelto and some medications until her appt. Please call pt

## 2013-08-23 NOTE — Telephone Encounter (Signed)
Spoke with pt, refills to last to appt given

## 2013-08-30 ENCOUNTER — Encounter: Payer: Self-pay | Admitting: Internal Medicine

## 2013-09-12 ENCOUNTER — Ambulatory Visit (INDEPENDENT_AMBULATORY_CARE_PROVIDER_SITE_OTHER): Payer: 59 | Admitting: Cardiology

## 2013-09-12 ENCOUNTER — Encounter: Payer: Self-pay | Admitting: Cardiology

## 2013-09-12 VITALS — BP 140/82 | HR 68 | Ht 65.0 in | Wt 267.0 lb

## 2013-09-12 DIAGNOSIS — I10 Essential (primary) hypertension: Secondary | ICD-10-CM

## 2013-09-12 DIAGNOSIS — R609 Edema, unspecified: Secondary | ICD-10-CM

## 2013-09-12 DIAGNOSIS — I4891 Unspecified atrial fibrillation: Secondary | ICD-10-CM

## 2013-09-12 NOTE — Assessment & Plan Note (Signed)
Continue Lasix 

## 2013-09-12 NOTE — Assessment & Plan Note (Signed)
Blood pressure controlled. Continue present medications. 

## 2013-09-12 NOTE — Progress Notes (Signed)
HPI: FU atrial fibrillation. Echocardiogram in November 2013 showed an ejection fraction of 50-55%, moderate left atrial enlargement, mild right atrial enlargement and mildly elevated pulmonary pressures. Patient underwent cardioversion on 01/09/2013 to sinus rhythm. TSH in May of 2014 1.283. I last saw her in July of 2014. Since then, the patient has dyspnea with more extreme activities but not with routine activities. It is relieved with rest. It is not associated with chest pain. There is no orthopnea, PND or pedal edema. There is no syncope or palpitations. There is no exertional chest pain.    Current Outpatient Prescriptions  Medication Sig Dispense Refill  . Butalbital-Acetaminophen (BUPAP) 50-300 MG TABS Take 2 tablets by mouth 2 (two) times daily. Take only occasionally for severe headaches.  60 tablet  1  . celecoxib (CELEBREX) 200 MG capsule Take 1 capsule (200 mg total) by mouth 2 (two) times daily.  60 capsule  0  . clobetasol cream (TEMOVATE) 0.05 % Apply topically as needed.      . desonide (DESOWEN) 0.05 % lotion Apply to rash on face or under breasts twice daily for 7 to 10 days  59 mL  0  . etanercept (ENBREL) 50 MG/ML injection Inject 50 mg into the skin once a week.      . fluconazole (DIFLUCAN) 200 MG tablet Take 200 mg by mouth as needed.      . fluticasone (FLONASE) 50 MCG/ACT nasal spray Place 2 sprays into both nostrils daily.  16 g  11  . furosemide (LASIX) 40 MG tablet Take 1 tablet (40 mg total) by mouth daily.  90 tablet  3  . ketoconazole (NIZORAL) 2 % cream Apply 1 application topically 2 (two) times daily.  60 g  0  . Multiple Vitamins-Minerals (MULTIVITAMIN WITH MINERALS) tablet Take 1 tablet by mouth daily with supper.      . nebivolol (BYSTOLIC) 10 MG tablet Take 1 tablet (10 mg total) by mouth daily.  30 tablet  5  . potassium chloride (K-DUR,KLOR-CON) 10 MEQ tablet Take 10 mEq by mouth daily.      Marland Kitchen terconazole (TERAZOL 3) 0.8 % vaginal cream Place 1  applicator vaginally at bedtime. Take for 3 days  20 g  0  . Vitamin D, Ergocalciferol, (DRISDOL) 50000 UNITS CAPS capsule Take 1 capsule (50,000 Units total) by mouth 2 (two) times a week.  8 capsule  11  . XARELTO 20 MG TABS tablet Take 1 tablet (20 mg total) by mouth daily with supper.  30 tablet  2   No current facility-administered medications for this visit.     Past Medical History  Diagnosis Date  . Atrial fibrillation   . Right hip pain   . Hypertension   . Morbid obesity   . Psoriasis     active breakout left buttocks  . Shortness of breath   . Osteoarthritis   . Avascular necrosis of bone of right hip   . Tachycardia, unspecified     Past Surgical History  Procedure Laterality Date  . Total hip arthroplasty  07/21/2012    Procedure: TOTAL HIP ARTHROPLASTY;  Surgeon: [name], MD;  Location: [facility];  Service: Orthopedics;  Laterality: Right;  . Fracture surgery  07/21/12    right hip arthroplasty  . Cardioversion N/A 01/09/2013    Procedure: CARDIOVERSION;  Surgeon: [name], MD;  Location: [facility] ENDOSCOPY;  Service: Cardiovascular;  Laterality: N/A;    History   Social History  .  Marital Status: Divorced    Spouse Name: [name]    Number of Children: 1  . Years of Education: 12   Occupational History  . [employer]    Social History Main Topics  . Smoking status: Former Smoker    Types: Cigarettes    Quit date: 09/13/1982  . Smokeless tobacco: Never Used  . Alcohol Use: Yes     Comment: Occasionally  . Drug Use: No  . Sexual Activity: Yes    Partners: Male   Other Topics Concern  . 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  She used to  smoke 4 cigarettes per day for five years and quit 32 years ago.    Alcohol Use:  Occasional   Drug Use:  None   Diet:  Regular   Exercise:  Water Aerobics (2 x per week)     Hobbies: Shopping, Cycling           ROS: no fevers or chills, productive cough, hemoptysis, dysphasia, odynophagia, melena, hematochezia, dysuria, hematuria, rash, seizure activity, orthopnea, PND, pedal edema, claudication. Remaining systems are negative.  Physical Exam: Well-developed obese in no acute distress.  Skin is warm and dry.  HEENT is normal.  Neck is supple.  Chest is clear to auscultation with normal expansion.  Cardiovascular exam is regular rate and rhythm.  Abdominal exam nontender or distended. No masses palpated. Extremities show no edema. neuro grossly intact  ECG sinus rhythm at a rate of 68. No ST changes.

## 2013-09-12 NOTE — Patient Instructions (Signed)
Your physician wants you to follow-up in: 6 months with dr crenshaw You will receive a reminder letter in the mail two months in advance. If you don't receive a letter, please call our office to schedule the follow-up appointment.  

## 2013-09-12 NOTE — Assessment & Plan Note (Signed)
Patient remains in sinus rhythm. Continue beta blocker. Continue xeralto. Embolic risk factors of hypertension and female sex. Long discussion today concerning options for anticoagulation. She would prefer to be off of xeralto. I explained the higher risk of stroke with aspirin compared to this medication. She will think about this and continue xeralto for now.

## 2013-09-26 ENCOUNTER — Ambulatory Visit (INDEPENDENT_AMBULATORY_CARE_PROVIDER_SITE_OTHER): Payer: 59 | Admitting: Family Medicine

## 2013-09-26 ENCOUNTER — Encounter: Payer: Self-pay | Admitting: Family Medicine

## 2013-09-26 VITALS — BP 142/85 | HR 64 | Resp 16 | Wt 273.0 lb

## 2013-09-26 DIAGNOSIS — G5600 Carpal tunnel syndrome, unspecified upper limb: Secondary | ICD-10-CM

## 2013-09-26 DIAGNOSIS — I4891 Unspecified atrial fibrillation: Secondary | ICD-10-CM

## 2013-09-26 MED ORDER — DICLOFENAC SODIUM 1 % TD GEL: TRANSDERMAL | Status: AC

## 2013-09-26 NOTE — Patient Instructions (Addendum)
1)  Carpal Tunnel Syndrome - Wear the wrist splint at night and apply the Voltaren Gel 1-2 x per day.    Carpal Tunnel Syndrome Carpal tunnel syndrome is a disorder of the nervous system in the wrist that causes pain, hand weakness, and/or loss of feeling. Carpal tunnel syndrome is caused by the compression, stretching, or irritation of the median nerve at the wrist joint. Athletes who experience carpal tunnel syndrome may notice a decrease in their performance to the condition, especially for sports that require strong hand or wrist action.  SYMPTOMS   Tingling, numbness, or burning pain in the hand or fingers.  Inability to sleep due to pain in the hand.  Sharp pains that shoot from the wrist up the arm or to the fingers, especially at night.  Morning stiffness or cramping of the hand.  Thumb weakness, resulting in difficulty holding objects or making a fist.  Shiny, dry skin on the hand.  Reduced performance in any sport requiring a strong grip. CAUSES   Median nerve damage at the wrist is caused by pressure due to swelling, inflammation, or scarred tissue.  Sources of pressure include:  Repetitive gripping or squeezing that causes inflammation of the tendon sheaths.  Scarring or shortening of the ligament that covers the median nerve.  Traumatic injury to the wrist or forearm such as fracture, sprain, or dislocation.  Prolonged hyperextension (wrist bent backward) or hyperflexion (wrist bent downward) of the wrist. RISK INCREASES WITH:  Diabetes mellitus.  Menopause or amenorrhea.  Rheumatoid arthritis.  Raynaud's disease.  Pregnancy.  Gout.  Kidney disease.  Ganglion cyst.  Repetitive hand or wrist action.  Hypothyroidism (underactive thyroid gland).  Repetitive jolting or shaking of the hands or wrist.  Prolonged forceful weight-bearing on the hands. PREVENTION  Bracing the hand and wrist straight during activities that involve repetitive  grasping.  For activities that require prolonged extension of the wrist (bending towards the top of the forearm) periodically change the position of your wrists.  Learn and use proper technique in activities that result in the wrist position in neutral to slight extension.  Avoid bending the wrist into full extension or flexion (up or down)  Keep the wrist in a straight (neutral) position. To keep the wrist in this position, wear a splint.  Avoid repetitive hand and wrist motions.  When possible avoid prolonged grasping of items (steering wheel of a car, a pen, a vacuum cleaner, or a rake).  Loosen your grip for activities that require prolonged grasping of items.  Place keyboards and writing surfaces at the correct height as to decrease strain on the wrist and hand.  Alternate work tasks to avoid prolonged wrist flexion.  Avoid pinching activities (needlework and writing) as they may irritate your carpal tunnel syndrome.  If these activities are necessary, complete them for shorter periods of time.  When writing, use a felt tip or roller ball pen and/or build up the grip on a pen to decrease the forces required for writing. PROGNOSIS  Carpal tunnel syndrome is usually curable with appropriate conservative treatment and sometimes resolves spontaneously. For some cases, surgery is necessary, especially if muscle wasting or nerve changes have developed.  RELATED COMPLICATIONS   Permanent numbness and a weak thumb or fingers in the affected hand.  Permanent paralysis of a portion of the hand and fingers. TREATMENT  Treatment initially consists of stopping activities that aggravate the symptoms as well as medication and ice to reduce inflammation. A wrist splint is  often recommended for wear during activities of repetitive motion as well as at night. It is also important to learn and use proper technique when performing activities that typically cause pain. On occasion, a corticosteroid  injection may be given. If symptoms persist despite conservative treatment, surgery may be an option. Surgical techniques free the pinched or compressed nerve. Carpal tunnel surgery is usually performed on an outpatient basis, meaning you go home the same day as surgery. These procedures provide almost complete relief of all symptoms in 95% of patients. Expect at least 2 weeks for healing after surgery. For cases that are the result of repeated jolting or shaking of the hand or wrist or prolonged hyperextension, surgery is not usually recommended, because stretching of the median nerve and not compression are usually the cause of carpal tunnel syndrome in these cases. MEDICATION   If pain medication is necessary, nonsteroidal anti-inflammatory medications, such as aspirin and ibuprofen, or other minor pain relievers, such as acetaminophen, are often recommended.  Do not take pain medication for 7 days before surgery.  Prescription pain relievers are usually only prescribed after surgery. Use only as directed and only as much as you need.  Corticosteroid injections may be given to reduce inflammation. However, they are not always recommended.  Vitamin B6 (pyridoxine) may reduce symptoms; use only if prescribed for your disorder. SEEK MEDICAL CARE IF:   Symptoms get worse or do not improve in 2 weeks despite treatment.  You also have a current or recent history of neck or shoulder injury that has resulted in pain or tingling elsewhere in your arm. Document Released: 08/02/2005 Document Revised: 11/27/2012 Document Reviewed: 11/14/2008 [facility] Patient Information 2014 [place], [place].

## 2013-09-26 NOTE — Progress Notes (Signed)
Subjective:    Patient ID: Monica [name], female    DOB: 1956/01/20, 58 y.o.   MRN: [id number]  HPI  Monica [name] is here today to discuss a couple of issues:   1)  Numbness - She has been experiencing numbness in her left fingers and forearm for the past few weeks.    2)  Atrial Fibrillation - She wants to stop taking her Xarelto.  She has a long conversation with her cardiologist and he suggested her to come talk to her PCP.  She also has been experiencing tinnitus.  She has never experienced this problem before.  She feels that it may be due to Xarelto.      Review of Systems  HENT: Positive for tinnitus.   Neurological: Positive for numbness.       Left forearm and fingers  All other systems reviewed and are negative.     Past Medical History  Diagnosis Date  . Atrial fibrillation   . Right hip pain   . Hypertension   . Morbid obesity   . Psoriasis     active breakout left buttocks  . Shortness of breath   . Osteoarthritis   . Avascular necrosis of bone of right hip   . Tachycardia, unspecified      Past Surgical History  Procedure Laterality Date  . Total hip arthroplasty  07/21/2012    Procedure: TOTAL HIP ARTHROPLASTY;  Surgeon: [name], MD;  Location: [facility];  Service: Orthopedics;  Laterality: Right;  . Fracture surgery  07/21/12    right hip arthroplasty  . 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    EducationField seismologist    Tobacco Use/Exposure: Former Smoker.  She used to smoke 4 cigarettes per day for five years and quit 32 years ago.    Alcohol Use:  Occasional   Drug Use:  None   Diet:   Regular   Exercise:  Water Aerobics (2 x per week)     Hobbies: Shopping, Cycling               Family History  Problem Relation Age of Onset  . Hypertension Mother   . Diabetes Mother   . Alzheimer's disease Mother   . Thyroid disease Mother   . Diabetes Son      Current Outpatient Prescriptions on File Prior to Visit  Medication Sig Dispense Refill  . clobetasol cream (TEMOVATE) 0.05 % Apply topically as needed.      . desonide (DESOWEN) 0.05 % lotion Apply to rash on face or under breasts twice daily for 7 to 10 days  59 mL  0  . etanercept (ENBREL) 50 MG/ML injection Inject 50 mg into the skin once a week.      . fluticasone (FLONASE) 50 MCG/ACT nasal spray Place 2 sprays into both nostrils daily.  16 g  11  . furosemide (LASIX) 40 MG tablet Take 1 tablet (40 mg total) by mouth daily.  90 tablet  3  . ketoconazole (NIZORAL) 2 % cream Apply 1 application topically 2 (two) times daily.  Bourg  g  0  . Multiple Vitamins-Minerals (MULTIVITAMIN WITH MINERALS) tablet Take 1 tablet by mouth daily with supper.      . nebivolol (BYSTOLIC) 10 MG tablet Take 1 tablet (10 mg total) by mouth daily.  30 tablet  5  . potassium chloride (K-DUR,KLOR-CON) 10 MEQ tablet Take 10 mEq by mouth daily.      . Vitamin D, Ergocalciferol, (DRISDOL) 50000 UNITS CAPS capsule Take 1 capsule (50,000 Units total) by mouth 2 (two) times a week.  8 capsule  11  . XARELTO 20 MG TABS tablet Take 1 tablet (20 mg total) by mouth daily with supper.  30 tablet  2  . Butalbital-Acetaminophen (BUPAP) 50-300 MG TABS Take 2 tablets by mouth 2 (two) times daily. Take only occasionally for severe headaches.  60 tablet  1   No current facility-administered medications on file prior to visit.     No Known Allergies      Objective:   Physical Exam  Constitutional: She appears well-nourished. No distress.  HENT:  Head: Normocephalic.  Eyes: No scleral icterus.  Neck: No thyromegaly present.  Cardiovascular: Normal  rate, regular rhythm and normal heart sounds.   Pulmonary/Chest: Effort normal and breath sounds normal.  Abdominal: There is no tenderness.  Musculoskeletal: Normal range of motion. She exhibits tenderness (Wrists/Hands). She exhibits no edema.  Neurological: She is alert.  Skin: Skin is warm and dry.  Psychiatric: She has a normal mood and affect. Her behavior is normal. Judgment and thought content normal.          Assessment & Plan:    Monica [name] was seen today for numbness.  Diagnoses and associated orders for this visit:  Carpal tunnel syndrome Comments: She is to buy a [employer] wrist splint and wear as directed.   - diclofenac sodium (VOLTAREN) 1 % GEL; Apply 2-4 grams to left arm 1-2 times per day  Atrial fibrillation Comments: We discussed her coming off of Xarelto.  I explained to her that medications like Xarelto or Coumadin would give her the best protection.

## 2013-10-03 ENCOUNTER — Ambulatory Visit: Payer: 59 | Admitting: Cardiology

## 2013-10-04 ENCOUNTER — Telehealth: Payer: Self-pay

## 2013-10-04 ENCOUNTER — Encounter: Payer: Self-pay | Admitting: Internal Medicine

## 2013-10-04 ENCOUNTER — Ambulatory Visit (INDEPENDENT_AMBULATORY_CARE_PROVIDER_SITE_OTHER): Payer: 59 | Admitting: Internal Medicine

## 2013-10-04 VITALS — BP 136/80 | HR 72 | Ht 65.5 in | Wt 271.8 lb

## 2013-10-04 DIAGNOSIS — Z1211 Encounter for screening for malignant neoplasm of colon: Secondary | ICD-10-CM

## 2013-10-04 DIAGNOSIS — I4891 Unspecified atrial fibrillation: Secondary | ICD-10-CM

## 2013-10-04 DIAGNOSIS — Z7901 Long term (current) use of anticoagulants: Secondary | ICD-10-CM

## 2013-10-04 DIAGNOSIS — Z01818 Encounter for other preprocedural examination: Secondary | ICD-10-CM

## 2013-10-04 MED ORDER — NA SULFATE-K SULFATE-MG SULF 17.5-3.13-1.6 GM/177ML PO SOLN: ORAL | Status: DC

## 2013-10-04 NOTE — Telephone Encounter (Signed)
[facility]  [address] [place] [address]  10/04/2013   RE: Monica [name] DOB: 1956/02/13 MRN: [id number]   Dear Kirk Ruths M.D.,    We have scheduled the above patient for an endoscopic procedure. Our records show that she is on anticoagulation therapy.   Please advise as to how long the patient may come off her therapy of Xarelto prior to the colonoscopy procedure, which is scheduled for 11/02/13.  Please fax back/ or route the completed form to [facility] [place], [place] at [phone].   Sincerely,   Dr Silvano Rusk

## 2013-10-04 NOTE — Patient Instructions (Addendum)
You have been scheduled for a colonoscopy with propofol. Please follow written instructions given to you at your visit today.  Please pick up your prep kit at the pharmacy within the next 1-3 days. If you use inhalers (even only as needed), please bring them with you on the day of your procedure. Your physician has requested that you go to www.startemmi.com and enter the access code given to you at your visit today. This web site gives a general overview about your procedure. However, you should still follow specific instructions given to you by our office regarding your preparation for the procedure.  You will be contaced by our office prior to your procedure for directions on holding your Xarelto.  If you do not hear from our office 1 week prior to your scheduled procedure, please call [phone] to discuss.   I appreciate the opportunity to care for you.

## 2013-10-04 NOTE — Telephone Encounter (Signed)
DC xeralto 2 days prior to procedure and resume day after Kirk Ruths

## 2013-10-04 NOTE — Telephone Encounter (Signed)
Left message for patient to call me to discuss holding xarelto .

## 2013-10-04 NOTE — Progress Notes (Signed)
Subjective:    Patient ID: Monica [name], female    DOB: 09/25/1955, 58 y.o.   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 No active GI sxs but she has a hx of Afib and is on Xarelto for stroke prevention.  No Known Allergies Outpatient Prescriptions Prior to Visit  Medication Sig Dispense Refill  . Butalbital-Acetaminophen (BUPAP) 50-300 MG TABS Take 2 tablets by mouth 2 (two) times daily. Take only occasionally for severe headaches.  60 tablet  1  . celecoxib (CELEBREX) 200 MG capsule Take 1 capsule (200 mg total) by mouth 2 (two) times daily.  60 capsule  0  . clobetasol cream (TEMOVATE) 0.05 % Apply topically as needed.      . desonide (DESOWEN) 0.05 % lotion Apply to rash on face or under breasts twice daily for 7 to 10 days  59 mL  0  . diclofenac sodium (VOLTAREN) 1 % GEL Apply 2-4 grams to left arm 1-2 times per day  5 Tube  1  . etanercept (ENBREL) 50 MG/ML injection Inject 50 mg into the skin once a week.      . fluticasone (FLONASE) 50 MCG/ACT nasal spray Place 2 sprays into both nostrils daily.  16 g  11  . furosemide (LASIX) 40 MG tablet Take 1 tablet (40 mg total) by mouth daily.  90 tablet  3  . ketoconazole (NIZORAL) 2 % cream Apply 1 application topically 2 (two) times daily.  60 g  0  . Multiple Vitamins-Minerals (MULTIVITAMIN WITH MINERALS) tablet Take 1 tablet by mouth daily with supper.      . nebivolol (BYSTOLIC) 10 MG tablet Take 1 tablet (10 mg total) by mouth daily.  30 tablet  5  . potassium chloride (K-DUR,KLOR-CON) 10 MEQ tablet Take 10 mEq by mouth daily.      . Vitamin D, Ergocalciferol, (DRISDOL) 50000 UNITS CAPS capsule Take 1 capsule (50,000 Units total) by mouth 2 (two) times a week.  8 capsule  11  . XARELTO 20 MG TABS tablet Take 1 tablet (20 mg total) by mouth daily with supper.  30 tablet  2   No facility-administered medications prior to visit.   Past Medical History  Diagnosis  Date  . Atrial fibrillation   . Right hip pain   . Hypertension   . Morbid obesity   . Psoriasis     active breakout left buttocks  . Shortness of breath   . Osteoarthritis   . Avascular necrosis of bone of right hip   . Tachycardia, unspecified    Past Surgical History  Procedure Laterality Date  . Total hip arthroplasty  07/21/2012    Procedure: TOTAL HIP ARTHROPLASTY;  Surgeon: [name], MD;  Location: [facility];  Service: Orthopedics;  Laterality: Right;  . Fracture surgery  07/21/12    right hip arthroplasty  . Cardioversion N/A 01/09/2013    Procedure: CARDIOVERSION;  Surgeon: [name], MD;  Location: [facility] ENDOSCOPY;  Service: Cardiovascular;  Laterality: N/A;   History   Social History  . Marital Status: Divorced    Spouse Name: [name]    Number of Children: 1  . Years of Education: 12   Occupational History  . [employer]    Social History Main Topics  . Smoking status: Former Smoker    Types: Cigarettes    Quit date: 09/13/1982  .  Smokeless tobacco: Never Used  . Alcohol Use: Yes     Comment: Occasionally  . Drug Use: No  . Sexual Activity: Yes    Partners: Male   Other Topics Concern  . 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    EducationField seismologist    Tobacco Use/Exposure: Former Smoker.  She used to smoke 4 cigarettes per day for five years and quit 32 years ago.    Alcohol Use:  Occasional   Drug Use:  None   Diet:  Regular   Exercise:  Water Aerobics (2 x per week)     Hobbies: Shopping, Cycling             Family History  Problem Relation Age of Onset  . Hypertension Mother   . Diabetes Mother   . Alzheimer's disease Mother   . Thyroid disease Mother   . Diabetes Son        Review of  Systems + psoriasis rashes, allergies, headaches    Objective:   Physical Exam Obese bw NAD Lungs clr Cor S1S2 RRR no murmur       Assessment & Plan:  Special screening for malignant neoplasms, colon - Plan: Ambulatory referral to Gastroenterology  Pre-operative examination  Atrial fibrillation  Long term current use of anticoagulant  The patient is an average risk colon cancer screening candidate. She has decided to pursue colonoscopy - The risks and benefits as well as alternatives of endoscopic procedure(s) have been discussed and reviewed. All questions answered. The patient agrees to proceed. It would be best to hold Xarelto x 24-48 hrs before colonoscopy which carries a very slight risk of stroke given her Afib though she is in NSR.  Will ask Dr. Stanford Breed if that is ok - and we have scheduled colonoscopy.

## 2013-10-09 ENCOUNTER — Other Ambulatory Visit: Payer: Self-pay | Admitting: Family Medicine

## 2013-10-09 NOTE — Telephone Encounter (Signed)
Patient advised to hold xarelto 2 days prior to upcoming procedure and re-start day after procedure per Dr. Stanford Breed.  She verbalized understanding.

## 2013-10-14 DIAGNOSIS — B3731 Acute candidiasis of vulva and vagina: Secondary | ICD-10-CM

## 2013-10-14 DIAGNOSIS — R7301 Impaired fasting glucose: Secondary | ICD-10-CM

## 2013-10-14 DIAGNOSIS — B373 Candidiasis of vulva and vagina: Secondary | ICD-10-CM | POA: Insufficient documentation

## 2013-10-14 DIAGNOSIS — G5600 Carpal tunnel syndrome, unspecified upper limb: Secondary | ICD-10-CM | POA: Insufficient documentation

## 2013-10-14 DIAGNOSIS — L089 Local infection of the skin and subcutaneous tissue, unspecified: Secondary | ICD-10-CM

## 2013-10-14 DIAGNOSIS — Z1211 Encounter for screening for malignant neoplasm of colon: Secondary | ICD-10-CM | POA: Insufficient documentation

## 2013-10-14 HISTORY — DX: Local infection of the skin and subcutaneous tissue, unspecified: L08.9

## 2013-10-14 HISTORY — DX: Acute candidiasis of vulva and vagina: B37.31

## 2013-10-14 HISTORY — DX: Impaired fasting glucose: R73.01

## 2013-11-02 ENCOUNTER — Encounter: Payer: 59 | Admitting: Internal Medicine

## 2013-11-30 ENCOUNTER — Other Ambulatory Visit: Payer: Self-pay | Admitting: *Deleted

## 2013-11-30 ENCOUNTER — Other Ambulatory Visit: Payer: Self-pay | Admitting: Cardiology

## 2013-11-30 MED ORDER — XARELTO 20 MG PO TABS: 20.0000 mg | ORAL_TABLET | Freq: Every day | ORAL | Status: DC

## 2013-12-25 ENCOUNTER — Encounter: Payer: Self-pay | Admitting: Internal Medicine

## 2013-12-25 ENCOUNTER — Ambulatory Visit (AMBULATORY_SURGERY_CENTER): Payer: 59 | Admitting: Internal Medicine

## 2013-12-25 VITALS — BP 141/72 | HR 67 | Temp 96.0°F | Resp 15 | Ht 65.0 in | Wt 271.0 lb

## 2013-12-25 DIAGNOSIS — Z860100 Personal history of colon polyps, unspecified: Secondary | ICD-10-CM

## 2013-12-25 DIAGNOSIS — Z1211 Encounter for screening for malignant neoplasm of colon: Secondary | ICD-10-CM

## 2013-12-25 DIAGNOSIS — D126 Benign neoplasm of colon, unspecified: Secondary | ICD-10-CM

## 2013-12-25 HISTORY — DX: Personal history of colon polyps, unspecified: Z86.0100

## 2013-12-25 HISTORY — DX: Personal history of colonic polyps: Z86.010

## 2013-12-25 MED ORDER — PANTOPRAZOLE SODIUM 40 MG PO TBEC: 40.0000 mg | DELAYED_RELEASE_TABLET | Freq: Every day | ORAL | Status: DC

## 2013-12-25 MED ORDER — XARELTO 20 MG PO TABS: 20.0000 mg | ORAL_TABLET | Freq: Every day | ORAL | Status: DC

## 2013-12-25 MED ORDER — SODIUM CHLORIDE 0.9 % IV SOLN: 500.0000 mL | INTRAVENOUS | Status: DC

## 2013-12-25 NOTE — Progress Notes (Signed)
Procedure ends, to recovery, report given and VSS. 

## 2013-12-25 NOTE — Patient Instructions (Addendum)
I found and removed 2 polyps today - they look benign.  I will let you know pathology results and when to have another routine colonoscopy by mail.  Restart Xarelto on Friday May 15.  I sent a prescription for pantoprazole to your pharmacy - this prevents ulcers and since you take Celebrex and Xarelto I think it makes sense in you.  I appreciate the opportunity to care for you. Gatha Mayer, MD, FACG  YOU HAD AN ENDOSCOPIC PROCEDURE TODAY AT [facility]: Refer to the procedure report that was given to you for any specific questions about what was found during the examination.  If the procedure report does not answer your questions, please call your gastroenterologist to clarify.  If you requested that your care partner not be given the details of your procedure findings, then the procedure report has been included in a sealed envelope for you to review at your convenience later.  YOU SHOULD EXPECT: Some feelings of bloating in the abdomen. Passage of more gas than usual.  Walking can help get rid of the air that was put into your GI tract during the procedure and reduce the bloating. If you had a lower endoscopy (such as a colonoscopy or flexible sigmoidoscopy) you may notice spotting of blood in your stool or on the toilet paper. If you underwent a bowel prep for your procedure, then you may not have a normal bowel movement for a few days.  DIET: Your first meal following the procedure should be a light meal and then it is ok to progress to your normal diet.  A half-sandwich or bowl of soup is an example of a good first meal.  Heavy or fried foods are harder to digest and may make you feel nauseous or bloated.  Likewise meals heavy in dairy and vegetables can cause extra gas to form and this can also increase the bloating.  Drink plenty of fluids but you should avoid alcoholic beverages for 24 hours.  ACTIVITY: Your care partner should take you home directly after the procedure.   You should plan to take it easy, moving slowly for the rest of the day.  You can resume normal activity the day after the procedure however you should NOT DRIVE or use heavy machinery for 24 hours (because of the sedation medicines used during the test).    SYMPTOMS TO REPORT IMMEDIATELY: A gastroenterologist can be reached at any hour.  During normal business hours, 8:30 AM to 5:00 PM Monday through Friday, call [phone].  After hours and on weekends, please call the GI answering service at [phone] who will take a message and have the physician on call contact you.   Following lower endoscopy (colonoscopy or flexible sigmoidoscopy):  Excessive amounts of blood in the stool  Significant tenderness or worsening of abdominal pains  Swelling of the abdomen that is new, acute  Fever of 100F or higher  FOLLOW UP: If any biopsies were taken you will be contacted by phone or by letter within the next 1-3 weeks.  Call your gastroenterologist if you have not heard about the biopsies in 3 weeks.  Our staff will call the home number listed on your records the next business day following your procedure to check on you and address any questions or concerns that you may have at that time regarding the information given to you following your procedure. This is a courtesy call and so if there is no answer at the home  number and we have not heard from you through the emergency physician on call, we will assume that you have returned to your regular daily activities without incident.  SIGNATURES/CONFIDENTIALITY: You and/or your care partner have signed paperwork which will be entered into your electronic medical record.  These signatures attest to the fact that that the information above on your After Visit Summary has been reviewed and is understood.  Full responsibility of the confidentiality of this discharge information lies with you and/or your care-partner.  Recommendations Restart Xarelto on  Friday, May 15th.  Next colonoscopy determined by pathology results.

## 2013-12-25 NOTE — Progress Notes (Signed)
Called to room to assist during endoscopic procedure.  Patient ID and intended procedure confirmed with present staff. Received instructions for my participation in the procedure from the performing physician.  

## 2013-12-25 NOTE — Op Note (Signed)
[facility]  [address] [place], [address]   COLONOSCOPY PROCEDURE REPORT  PATIENT: Monica, [name]  MR#: [id number] BIRTHDATE: 21-Jan-1956 , 57  yrs. old GENDER: Female ENDOSCOPIST: Gatha Mayer, MD, [facility] PROCEDURE DATE:  12/25/2013 PROCEDURE:   Colonoscopy with snare polypectomy First Screening Colonoscopy - Avg.  risk and is 50 yrs.  old or older Yes.  Prior Negative Screening - Now for repeat screening. N/A  History of Adenoma - Now for follow-up colonoscopy & has been > or = to 3 yrs.  N/A  Polyps Removed Today? Yes. ASA CLASS:   Class III INDICATIONS:average risk screening and first colonoscopy. MEDICATIONS: Propofol (Diprivan) 340 mg IV and These medications were titrated to patient response per physician's verbal order  DESCRIPTION OF PROCEDURE:   After the risks benefits and alternatives of the procedure were thoroughly explained, informed consent was obtained.  A digital rectal exam revealed no abnormalities of the rectum.   The LB [id number]  endoscope was introduced through the anus and advanced to the cecum, which was identified by both the appendix and ileocecal valve. No adverse events experienced.   The quality of the prep was excellent using Suprep  The instrument was then slowly withdrawn as the colon was fully examined.  COLON FINDINGS: Two sessile polyps measuring 10 and 12 mm in size were found in the sigmoid colon.  A polypectomy was performed using snare cautery.  The resection was complete and the polyp tissue was completely retrieved.   A clip was applied to each site to reduce bleeding risk, she takes xarelto. The colon mucosa was otherwise normal.  Retroflexed views revealed no abnormalities. The time to cecum=3 minutes 18 seconds.  Withdrawal time=10 minutes 54 seconds. The scope was withdrawn and the procedure completed. COMPLICATIONS: There were no complications.  ENDOSCOPIC IMPRESSION: 1.   Two sessile polyps  measuring 10 and 12 mm in size were found in the sigmoid colon; polypectomy was performed using snare cautery - prophylactic clips applied 2.   The colon mucosa was otherwise normal - excellent prep first colonoscopy  RECOMMENDATIONS: Restart Xarelto May 15 repeat colonoscopy pending pathology   eSigned:  Gatha Mayer, MD, [facility] 12/25/2013 9:50 AM   cc: Ival Bible MD and The Patient

## 2013-12-26 ENCOUNTER — Telehealth: Payer: Self-pay | Admitting: *Deleted

## 2013-12-26 NOTE — Telephone Encounter (Signed)
  Follow up Call-  Call back number 12/25/2013  Post procedure Call Back phone  # [phone]  Permission to leave phone message Yes     Patient questions:  Do you have a fever, pain , or abdominal swelling? no Pain Score  0 *  Have you tolerated food without any problems? yes  Have you been able to return to your normal activities? yes  Do you have any questions about your discharge instructions: Diet   no Medications  no Follow up visit  no  Do you have questions or concerns about your Care? no  Actions: * If pain score is 4 or above: No action needed, pain <4.

## 2014-01-02 ENCOUNTER — Encounter: Payer: Self-pay | Admitting: Internal Medicine

## 2014-01-02 NOTE — Progress Notes (Signed)
Quick Note:  2 large hyperplastic sigmoid polyps - repeat colon 5 years due to size  ______

## 2014-01-14 ENCOUNTER — Ambulatory Visit (INDEPENDENT_AMBULATORY_CARE_PROVIDER_SITE_OTHER): Payer: 59 | Admitting: Internal Medicine

## 2014-01-14 ENCOUNTER — Ambulatory Visit: Payer: 59

## 2014-01-14 VITALS — BP 152/98 | HR 73 | Temp 97.6°F | Resp 16 | Wt 274.0 lb

## 2014-01-14 DIAGNOSIS — M79602 Pain in left arm: Secondary | ICD-10-CM

## 2014-01-14 DIAGNOSIS — M79609 Pain in unspecified limb: Secondary | ICD-10-CM

## 2014-01-14 DIAGNOSIS — M25512 Pain in left shoulder: Secondary | ICD-10-CM

## 2014-01-14 DIAGNOSIS — M25519 Pain in unspecified shoulder: Secondary | ICD-10-CM

## 2014-01-14 MED ORDER — TRAMADOL HCL 50 MG PO TABS: 50.0000 mg | ORAL_TABLET | Freq: Three times a day (TID) | ORAL | Status: DC | PRN

## 2014-01-14 MED ORDER — CYCLOBENZAPRINE HCL 5 MG PO TABS: 5.0000 mg | ORAL_TABLET | Freq: Three times a day (TID) | ORAL | Status: DC | PRN

## 2014-01-14 NOTE — Patient Instructions (Signed)
Flexeril as directed. Ultram as directed. You hsould not drive or use and dangerous equiptment when taking these meds due to possible sedation. Warm moist compresses to your shoulder and back area. Return if no improvement or worsening of your symptoms.

## 2014-01-14 NOTE — Progress Notes (Signed)
   Subjective:    Patient ID: Monica [name], female    DOB: 04-19-1956, 58 y.o.   MRN: [id number]  HPI CC: pain in the left shoulder and left back radiating down the left arm HPI: Acute onset of pain in the left shoulder and left scapular area. Onset  4 days ago. Quality aching with episodes of sharper pain in the scapular area when someone touches it Pain that has been constant and rated at a 6/10 for the past 3 days Worse with motion Radiates down the left arm Denies chest pain  Denies shortness of breath  She is using voltaren cream with no results. Thought the pain started when she slept on her left shoulder 4 nights ago. No trauma. worse with certain positions and movements of her left shoulder and arm.  Works in the [employer].   Review of Systems  Constitutional: Negative.   Cardiovascular: Negative for chest pain.  Musculoskeletal: Positive for back pain, myalgias and neck pain. Negative for neck stiffness.  All other systems reviewed and are negative.      Objective:   Physical Exam  Nursing note and vitals reviewed. Constitutional: She is oriented to person, place, and time. She appears well-developed and well-nourished.  HENT:  Head: Normocephalic and atraumatic.  Right Ear: External ear normal.  Left Ear: External ear normal.  Nose: Nose normal.  Mouth/Throat: Oropharynx is clear and moist.  Eyes: Conjunctivae and EOM are normal. Pupils are equal, round, and reactive to light.  Neck: Normal range of motion. Neck supple.  Cardiovascular: Normal rate, regular rhythm, normal heart sounds and intact distal pulses.   Pulmonary/Chest: Effort normal and breath sounds normal.  Abdominal: Soft. Bowel sounds are normal.  Musculoskeletal: She exhibits tenderness.  Point tenderness ofver the superior medial aspect of the left scapular and the trapezius muscle extending up to wards the left neck. Pain is illicitedon palpation and reproducible  Lymphadenopathy:    She has  no cervical adenopathy.  Neurological: She is alert and oriented to person, place, and time. No cranial nerve deficit. She exhibits normal muscle tone.  Skin: Skin is warm and dry. Rash noted.  Patches of psoriasis on the back  Psychiatric: She has a normal mood and affect. Her behavior is normal. Judgment and thought content normal.     EKG: NSR HR 73 nonspecific st t wave changes.  [facility] reading (PRIMARY) by  Dr. Benjaman Lobe no fracture or dislocation.      Assessment & Plan:  Pain appears to be musculoskeletal in nature and reproducible on palpation. Patient is requesting a ekg because her coworkers in the er thought she should have one because of her previous hx of ablation. Pt is in nsr. No acute changes. Xray is normal. This pain appears to be of musculoskeletal origin. Pt is already on a nsai chronically. I will rx her with a muscle relaxant and an analgesic.

## 2014-01-15 ENCOUNTER — Ambulatory Visit: Payer: 59 | Admitting: Family Medicine

## 2014-01-17 ENCOUNTER — Other Ambulatory Visit: Payer: Self-pay | Admitting: Family Medicine

## 2014-01-17 ENCOUNTER — Ambulatory Visit (INDEPENDENT_AMBULATORY_CARE_PROVIDER_SITE_OTHER): Payer: 59 | Admitting: Podiatry

## 2014-01-17 ENCOUNTER — Ambulatory Visit (INDEPENDENT_AMBULATORY_CARE_PROVIDER_SITE_OTHER): Payer: 59

## 2014-01-17 ENCOUNTER — Encounter: Payer: Self-pay | Admitting: Podiatry

## 2014-01-17 VITALS — BP 119/76 | HR 68 | Resp 16 | Ht 65.0 in | Wt 265.0 lb

## 2014-01-17 DIAGNOSIS — M7660 Achilles tendinitis, unspecified leg: Secondary | ICD-10-CM

## 2014-01-17 DIAGNOSIS — L600 Ingrowing nail: Secondary | ICD-10-CM

## 2014-01-17 MED ORDER — TRIAMCINOLONE ACETONIDE 10 MG/ML IJ SUSP
10.0000 mg | Freq: Once | INTRAMUSCULAR | Status: AC
Start: 2014-01-17 — End: 2014-01-17
  Administered 2014-01-17: 10 mg

## 2014-01-17 NOTE — Patient Instructions (Addendum)
Betadine Soak Instructions  Purchase an 8 oz. bottle of BETADINE solution (Povidone)  THE DAY AFTER THE PROCEDURE  Place 1 tablespoon of betadine solution in a quart of warm tap water.  Submerge your foot or feet with outer bandage intact for the initial soak; this will allow the bandage to become moist and wet for easy lift off.  Once you remove your bandage, continue to soak in the solution for 20 minutes.  This soak should be done twice a day.  Next, remove your foot or feet from solution, blot dry the affected area and cover.  You may use a band aid large enough to cover the area or use gauze and tape.  Apply other medications to the area as directed by the doctor such as cortisporin otic solution (ear drops) or neosporin.  IF YOUR SKIN BECOMES IRRITATED WHILE USING THESE INSTRUCTIONS, IT IS OKAY TO SWITCH TO EPSOM SALTS AND WATER OR WHITE VINEGAR AND WATER.   Achilles Tendinitis  with Rehab Achilles tendinitis is a disorder of the Achilles tendon. The Achilles tendon connects the large calf muscles (Gastrocnemius and Soleus) to the heel bone (calcaneus). This tendon is sometimes called the heel cord. It is important for pushing-off and standing on your toes and is important for walking, running, or jumping. Tendinitis is often caused by overuse and repetitive microtrauma. SYMPTOMS  Pain, tenderness, swelling, warmth, and redness may occur over the Achilles tendon even at rest.  Pain with pushing off, or flexing or extending the ankle.  Pain that is worsened after or during activity. CAUSES   Overuse sometimes seen with rapid increase in exercise programs or in sports requiring running and jumping.  Poor physical conditioning (strength and flexibility or endurance).  Running sports, especially training running down hills.  Inadequate warm-up before practice or play or failure to stretch before participation.  Injury to the tendon. PREVENTION   Warm up and stretch before practice  or competition.  Allow time for adequate rest and recovery between practices and competition.  Keep up conditioning.  Keep up ankle and leg flexibility.  Improve or keep muscle strength and endurance.  Improve cardiovascular fitness.  Use proper technique.  Use proper equipment (shoes, skates).  To help prevent recurrence, taping, protective strapping, or an adhesive bandage may be recommended for several weeks after healing is complete. PROGNOSIS   Recovery may take weeks to several months to heal.  Longer recovery is expected if symptoms have been prolonged.  Recovery is usually quicker if the inflammation is due to a direct blow as compared with overuse or sudden strain. RELATED COMPLICATIONS   Healing time will be prolonged if the condition is not correctly treated. The injury must be given plenty of time to heal.  Symptoms can reoccur if activity is resumed too soon.  Untreated, tendinitis may increase the risk of tendon rupture requiring additional time for recovery and possibly surgery. TREATMENT   The first treatment consists of rest anti-inflammatory medication, and ice to relieve the pain.  Stretching and strengthening exercises after resolution of pain will likely help reduce the risk of recurrence. Referral to a physical therapist or athletic trainer for further evaluation and treatment may be helpful.  A walking boot or cast may be recommended to rest the Achilles tendon. This can help break the cycle of inflammation and microtrauma.  Arch supports (orthotics) may be prescribed or recommended by your caregiver as an adjunct to therapy and rest.  Surgery to remove the inflamed tendon lining  or degenerated tendon tissue is rarely necessary and has shown less than predictable results. MEDICATION   Nonsteroidal anti-inflammatory medications, such as aspirin and ibuprofen, may be used for pain and inflammation relief. Do not take within 7 days before surgery. Take  these as directed by your caregiver. Contact your caregiver immediately if any bleeding, stomach upset, or signs of allergic reaction occur. Other minor pain relievers, such as acetaminophen, may also be used.  Pain relievers may be prescribed as necessary by your caregiver. Do not take prescription pain medication for longer than 4 to 7 days. Use only as directed and only as much as you need.  Cortisone injections are rarely indicated. Cortisone injections may weaken tendons and predispose to rupture. It is better to give the condition more time to heal than to use them. HEAT AND COLD  Cold is used to relieve pain and reduce inflammation for acute and chronic Achilles tendinitis. Cold should be applied for 10 to 15 minutes every 2 to 3 hours for inflammation and pain and immediately after any activity that aggravates your symptoms. Use ice packs or an ice massage.  Heat may be used before performing stretching and strengthening activities prescribed by your caregiver. Use a heat pack or a warm soak. SEEK MEDICAL CARE IF:  Symptoms get worse or do not improve in 2 weeks despite treatment.  New, unexplained symptoms develop. Drugs used in treatment may produce side effects.  EXERCISES:  RANGE OF MOTION (ROM) AND STRETCHING EXERCISES - Achilles Tendinitis  These exercises may help you when beginning to rehabilitate your injury. Your symptoms may resolve with or without further involvement from your physician, physical therapist or athletic trainer. While completing these exercises, remember:   Restoring tissue flexibility helps normal motion to return to the joints. This allows healthier, less painful movement and activity.  An effective stretch should be held for at least 30 seconds.  A stretch should never be painful. You should only feel a gentle lengthening or release in the stretched tissue.  STRETCH  Gastroc, Standing   Place hands on wall.  Extend right / left leg, keeping the  front knee somewhat bent.  Slightly point your toes inward on your back foot.  Keeping your right / left heel on the floor and your knee straight, shift your weight toward the wall, not allowing your back to arch.  You should feel a gentle stretch in the right / left calf. Hold this position for 10 seconds. Repeat 3 times. Complete this stretch 2 times per day.  STRETCH  Soleus, Standing   Place hands on wall.  Extend right / left leg, keeping the other knee somewhat bent.  Slightly point your toes inward on your back foot.  Keep your right / left heel on the floor, bend your back knee, and slightly shift your weight over the back leg so that you feel a gentle stretch deep in your back calf.  Hold this position for 10 seconds. Repeat 3 times. Complete this stretch 2 times per day.  STRETCH  Gastrocsoleus, Standing  Note: This exercise can place a lot of stress on your foot and ankle. Please complete this exercise only if specifically instructed by your caregiver.   Place the ball of your right / left foot on a step, keeping your other foot firmly on the same step.  Hold on to the wall or a rail for balance.  Slowly lift your other foot, allowing your body weight to press your heel down  over the edge of the step.  You should feel a stretch in your right / left calf.  Hold this position for 10 seconds.  Repeat this exercise with a slight bend in your knee. Repeat 3 times. Complete this stretch 2 times per day.   STRENGTHENING EXERCISES - Achilles Tendinitis These exercises may help you when beginning to rehabilitate your injury. They may resolve your symptoms with or without further involvement from your physician, physical therapist or athletic trainer. While completing these exercises, remember:   Muscles can gain both the endurance and the strength needed for everyday activities through controlled exercises.  Complete these exercises as instructed by your physician,  physical therapist or athletic trainer. Progress the resistance and repetitions only as guided.  You may experience muscle soreness or fatigue, but the pain or discomfort you are trying to eliminate should never worsen during these exercises. If this pain does worsen, stop and make certain you are following the directions exactly. If the pain is still present after adjustments, discontinue the exercise until you can discuss the trouble with your clinician.  STRENGTH - Plantar-flexors   Sit with your right / left leg extended. Holding onto both ends of a rubber exercise band/tubing, loop it around the ball of your foot. Keep a slight tension in the band.  Slowly push your toes away from you, pointing them downward.  Hold this position for 10 seconds. Return slowly, controlling the tension in the band/tubing. Repeat 3 times. Complete this exercise 2 times per day.   STRENGTH - Plantar-flexors   Stand with your feet shoulder width apart. Steady yourself with a wall or table using as little support as needed.  Keeping your weight evenly spread over the width of your feet, rise up on your toes.*  Hold this position for 10 seconds. Repeat 3 times. Complete this exercise 2 times per day.  *If this is too easy, shift your weight toward your right / left leg until you feel challenged. Ultimately, you may be asked to do this exercise with your right / left foot only.  STRENGTH  Plantar-flexors, Eccentric  Note: This exercise can place a lot of stress on your foot and ankle. Please complete this exercise only if specifically instructed by your caregiver.   Place the balls of your feet on a step. With your hands, use only enough support from a wall or rail to keep your balance.  Keep your knees straight and rise up on your toes.  Slowly shift your weight entirely to your right / left toes and pick up your opposite foot. Gently and with controlled movement, lower your weight through your right /  left foot so that your heel drops below the level of the step. You will feel a slight stretch in the back of your calf at the end position.  Use the healthy leg to help rise up onto the balls of both feet, then lower weight only on the right / left leg again. Build up to 15 repetitions. Then progress to 3 consecutive sets of 15 repetitions.*  After completing the above exercise, complete the same exercise with a slight knee bend (about 30 degrees). Again, build up to 15 repetitions. Then progress to 3 consecutive sets of 15 repetitions.* Perform this exercise 2 times per day.  *When you easily complete 3 sets of 15, your physician, physical therapist or athletic trainer may advise you to add resistance by wearing a backpack filled with additional weight.  STRENGTH - Plantar  Flexors, Seated   Sit on a chair that allows your feet to rest flat on the ground. If necessary, sit at the edge of the chair.  Keeping your toes firmly on the ground, lift your right / left heel as far as you can without increasing any discomfort in your ankle. Repeat 3 times. Complete this exercise 2 times a day.

## 2014-01-17 NOTE — Progress Notes (Signed)
Subjective:     Patient ID: Monica [name], female   DOB: Dec 16, 1955, 58 y.o.   MRN: [id number]  Foot Pain   patient presents stating I have a very painful big toenail left and pain in the back of my heel is worsened over the last month. Nails been bothering me for several years   Review of Systems  All other systems reviewed and are negative.      Objective:   Physical Exam  Nursing note and vitals reviewed. Constitutional: She is oriented to person, place, and time.  Cardiovascular: Intact distal pulses.   Musculoskeletal: Normal range of motion.  Neurological: She is oriented to person, place, and time.  Skin: Skin is warm.   neurovascular status intact with muscle strength adequate and patient having good range of motion of the subtalar midtarsal joint. Equinus condition noted and she is quite obese with swelling of the ankle region both feet. Patient's hallux nail left is very thickened incurvated and sore when pressed and there is pain in the posterior lateral aspect of the Achilles tendon and insertion    Assessment:     Severe chronic nail disease big toenail left and Achilles tendinitis left    Plan:     H&P and x-rays reviewed with patient. Recommended removal of nail and explained procedure and risk and also Achilles tendon injection and risk. I first injected the Achilles tendon lateral side 3 mg Kenalog dexamethasone combination 5 mg Xylocaine and for the nail I infiltrated 60 mg Xylocaine Marcaine mixture and then did sterile prep to the nailbed area nail was removed in toto after prep and comical consisting of phenol 5 applications 30 seconds followed by alcohol lavaged and sterile dressing. Given instructions on soaks and reappoint as needed

## 2014-01-17 NOTE — Progress Notes (Signed)
   Subjective:    Patient ID: Monica [name], female    DOB: [birth year], 58 y.o.   MRN: [id number]  HPI Comments: "I have a problem with this toenail"  Patient c/o aching 1st toe left for several years. The toenail is thick and discolored. She has had the nail trimmed all the way down before and it grew back the same. Wondering if she needs it permanently removed.   Also, aching posterior heel left. No injury. She does have a lot of swelling.  Foot Pain Associated symptoms include myalgias and a rash.      Review of Systems  Constitutional: Positive for activity change and unexpected weight change.  HENT: Positive for sinus pressure and tinnitus.   Cardiovascular: Positive for leg swelling.  Musculoskeletal: Positive for back pain and myalgias.  Skin: Positive for rash.       change in nails  Allergic/Immunologic: Positive for environmental allergies.  All other systems reviewed and are negative.      Objective:   Physical Exam        Assessment & Plan:

## 2014-01-24 ENCOUNTER — Ambulatory Visit (INDEPENDENT_AMBULATORY_CARE_PROVIDER_SITE_OTHER): Payer: 59 | Admitting: Family Medicine

## 2014-01-24 ENCOUNTER — Encounter: Payer: Self-pay | Admitting: Family Medicine

## 2014-01-24 VITALS — BP 128/81 | HR 74 | Resp 16 | Wt 273.0 lb

## 2014-01-24 DIAGNOSIS — M7989 Other specified soft tissue disorders: Secondary | ICD-10-CM

## 2014-01-24 DIAGNOSIS — I10 Essential (primary) hypertension: Secondary | ICD-10-CM

## 2014-01-24 DIAGNOSIS — E876 Hypokalemia: Secondary | ICD-10-CM

## 2014-01-24 MED ORDER — FUROSEMIDE 40 MG PO TABS: 40.0000 mg | ORAL_TABLET | Freq: Two times a day (BID) | ORAL | Status: DC

## 2014-01-24 MED ORDER — NEBIVOLOL HCL 10 MG PO TABS: 10.0000 mg | ORAL_TABLET | Freq: Every day | ORAL | Status: DC

## 2014-01-24 MED ORDER — POTASSIUM CHLORIDE CRYS ER 10 MEQ PO TBCR: 10.0000 meq | EXTENDED_RELEASE_TABLET | Freq: Two times a day (BID) | ORAL | Status: DC

## 2014-01-24 NOTE — Progress Notes (Signed)
Subjective:    Patient ID: Monica [name], female    DOB: [birth year], 58 y.o.   MRN: [id number]  HPI  Monica [name] is here today to get some medication refills. She is doing well on her current medications.     Review of Systems  Constitutional: Negative for activity change, appetite change and fatigue.  Cardiovascular: Negative for chest pain, palpitations and leg swelling.  All other systems reviewed and are negative.    Past Medical History  Diagnosis Date  . Atrial fibrillation   . Right hip pain   . Hypertension   . Morbid obesity   . Psoriasis     active breakout left buttocks  . Shortness of breath   . Osteoarthritis   . Avascular necrosis of bone of right hip   . Tachycardia, unspecified   . Personal history of colonic polyps - large hyperplastic 12/25/2013     Past Surgical History  Procedure Laterality Date  . Total hip arthroplasty  07/21/2012    Procedure: TOTAL HIP ARTHROPLASTY;  Surgeon: [name], MD;  Location: [facility];  Service: Orthopedics;  Laterality: Right;  . Fracture surgery  07/21/12    right hip arthroplasty  . 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    EducationField seismologist    Tobacco Use/Exposure: Former Smoker.  She used to smoke 4 cigarettes per day for five years and quit 32 years ago.    Alcohol Use:  Occasional   Drug Use:  None   Diet:  Regular   Exercise:  Water Aerobics (2 x per week)     Hobbies: Shopping, Cycling               Family History  Problem Relation Age of Onset  . Hypertension Mother   . Diabetes Mother   . Alzheimer's disease Mother   .  Thyroid disease Mother   . Diabetes Son      Current Outpatient Prescriptions on File Prior to Visit  Medication Sig Dispense Refill  . Butalbital-Acetaminophen (BUPAP) 50-300 MG TABS Take 2 tablets by mouth 2 (two) times daily. Take only occasionally for severe headaches.  60 tablet  1  . CELEBREX 200 MG capsule TAKE 1 CAPSULE BY MOUTH 2 TIMES DAILY.  60 capsule  5  . cyclobenzaprine (FLEXERIL) 5 MG tablet Take 1 tablet (5 mg total) by mouth 3 (three) times daily as needed for muscle spasms.  30 tablet  1  . diclofenac sodium (VOLTAREN) 1 % GEL Apply 2-4 grams to left arm 1-2 times per day  5 Tube  1  . etanercept (ENBREL) 50 MG/ML injection Inject 50 mg into the skin once a week.      . fluticasone (FLONASE) 50 MCG/ACT nasal spray Place 2 sprays into both nostrils daily.  16 g  11  . Multiple Vitamins-Minerals (MULTIVITAMIN WITH MINERALS) tablet Take 1 tablet by mouth daily with supper.      . pantoprazole (PROTONIX) 40 MG tablet Take 1 tablet (40 mg total) by mouth daily before breakfast.  30 tablet  11  . traMADol (ULTRAM) 50  MG tablet Take 1 tablet (50 mg total) by mouth every 8 (eight) hours as needed.  30 tablet  0  . Vitamin D, Ergocalciferol, (DRISDOL) 50000 UNITS CAPS capsule Take 1 capsule (50,000 Units total) by mouth 2 (two) times a week.  8 capsule  11  . XARELTO 20 MG TABS tablet Take 1 tablet (20 mg total) by mouth daily with supper.  30 tablet  2   No current facility-administered medications on file prior to visit.     No Known Allergies      Objective:   Physical Exam  Nursing note and vitals reviewed. Constitutional: She appears well-developed and well-nourished.  HENT:  Head: Normocephalic.  Eyes: Pupils are equal, round, and reactive to light.  Neck: Normal range of motion.  Cardiovascular: Normal rate.   Pulmonary/Chest: Effort normal and breath sounds normal. No respiratory distress. She has no wheezes. She exhibits no tenderness.  Musculoskeletal: Normal  range of motion.  Neurological: She is alert.  Skin: Skin is warm and dry.  Psychiatric: She has a normal mood and affect. Her behavior is normal. Judgment and thought content normal.      Assessment & Plan:    Zaida was seen today for medication management.  Diagnoses and associated orders for this visit:  Essential hypertension, benign - nebivolol (BYSTOLIC) 10 MG tablet; Take 1 tablet (10 mg total) by mouth daily.  Swelling of limb - furosemide (LASIX) 40 MG tablet; Take 1 tablet (40 mg total) by mouth 2 (two) times daily.  Low blood potassium - potassium chloride (K-DUR,KLOR-CON) 10 MEQ tablet; Take 1 tablet (10 mEq total) by mouth 2 (two) times daily.

## 2014-03-08 IMAGING — CR DG CHEST 2V
2 series · 2 of 2 positions shown · non-contrast
Comparison: 09/20/2010

CLINICAL DATA: Preop for right hip replacement

CHEST - 2 VIEW

[w chest pa]
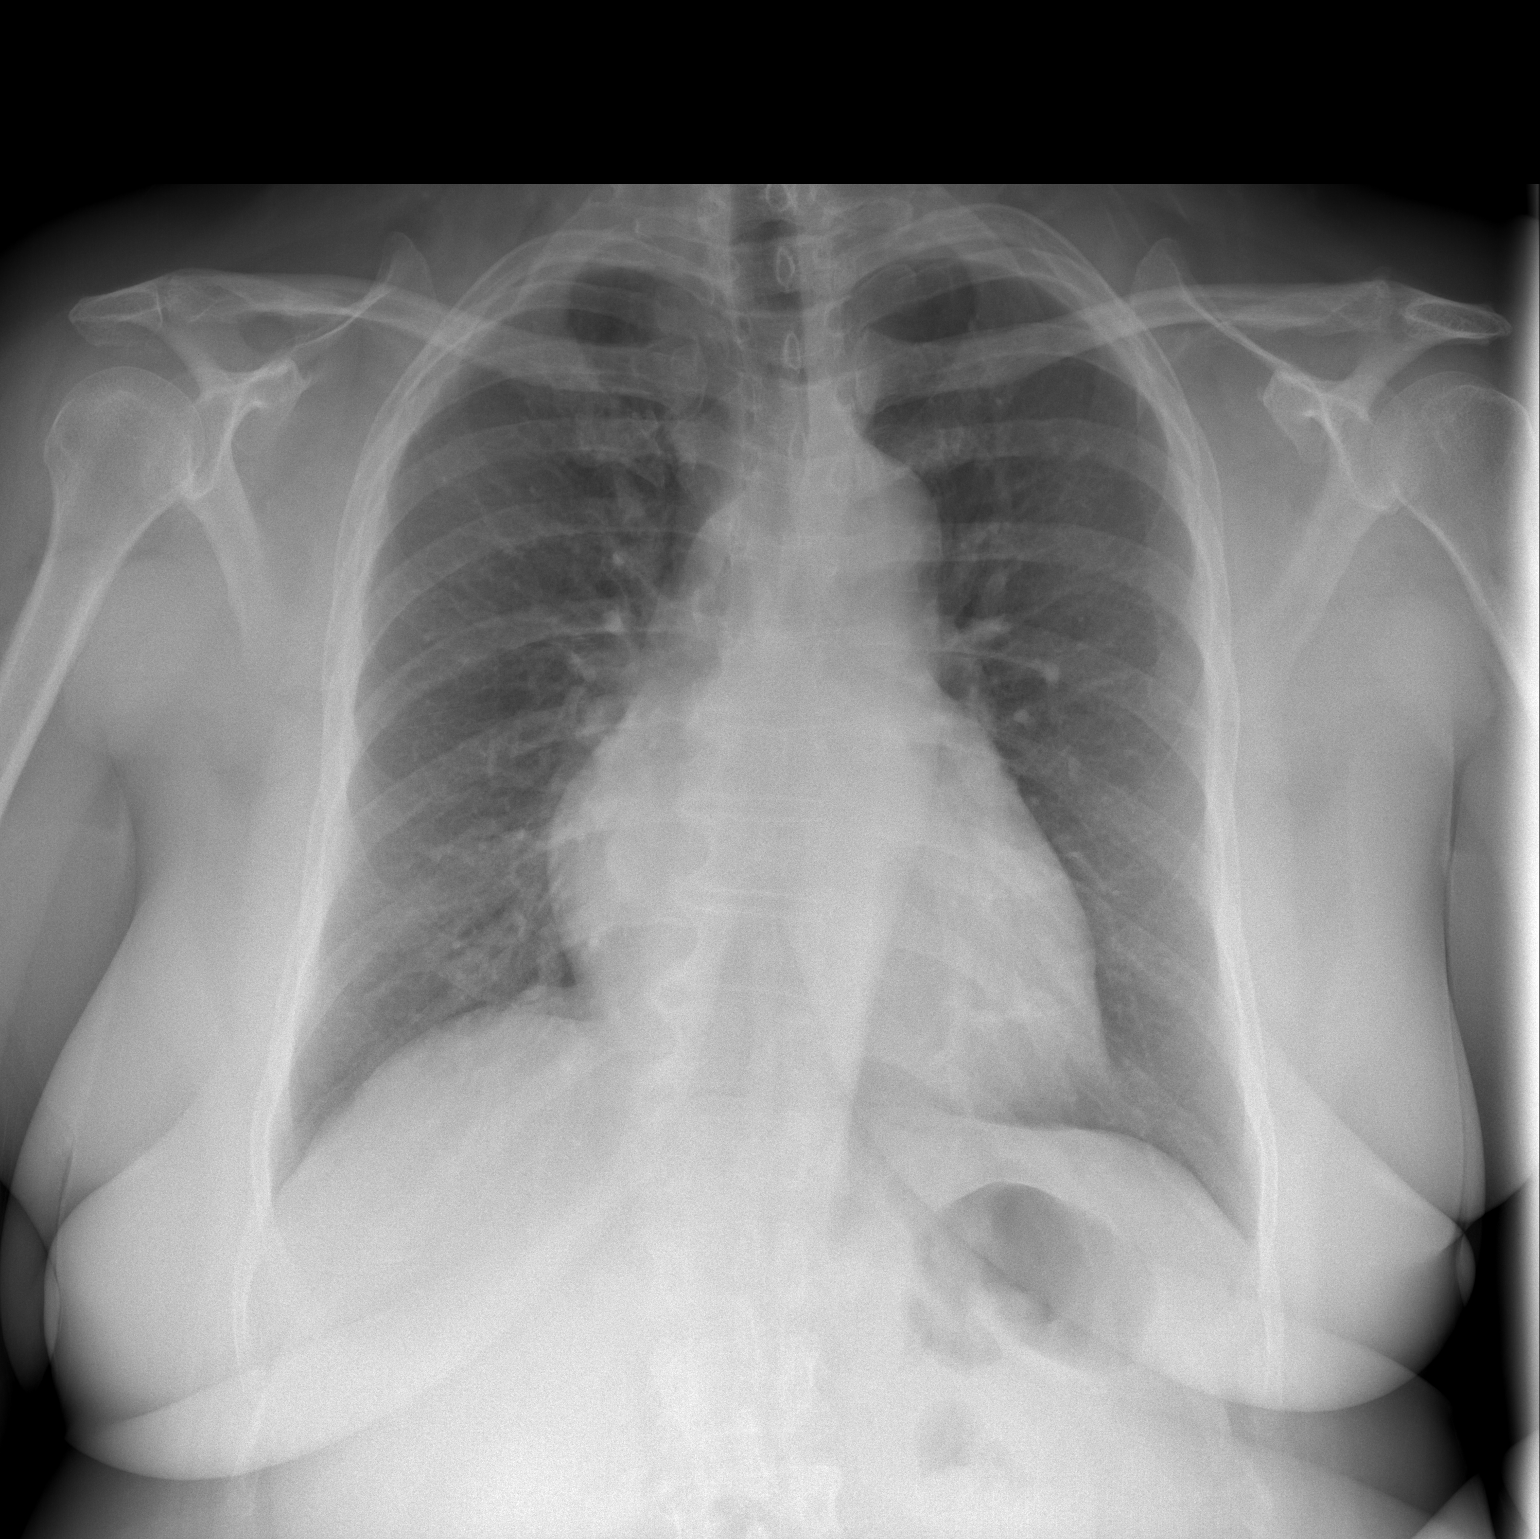

[w chest lat *]
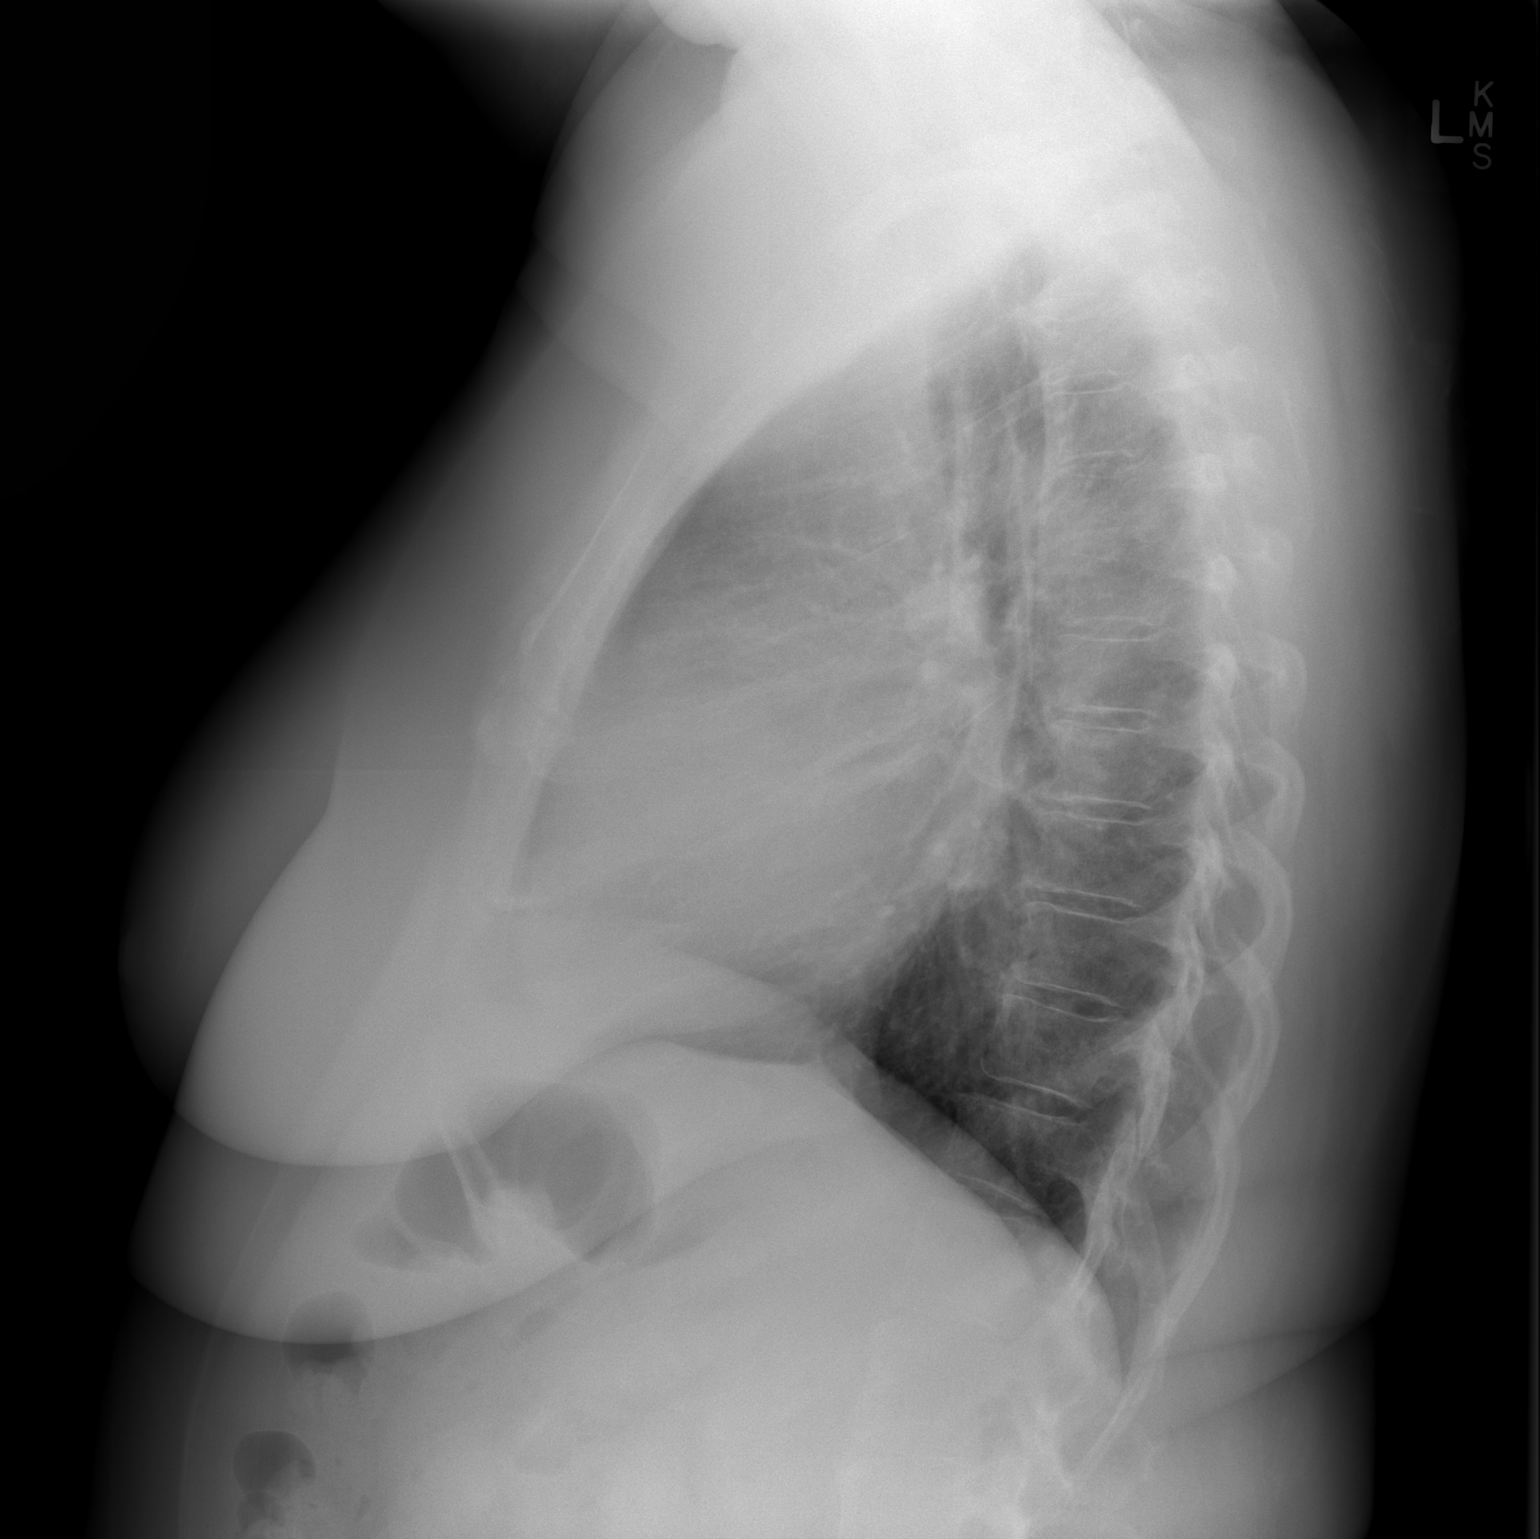

[2 of 2 positions shown; findings below may reference images not displayed]

FINDINGS: Borderline cardiomegaly again noted.  No acute infiltrate or
pleural effusion.  No pulmonary edema.  Stable mild lower thoracic
dextroscoliosis.  Again noted mild degenerative changes thoracic
spine.
IMPRESSION: No active disease.  No significant change.

## 2014-04-26 ENCOUNTER — Telehealth: Payer: Self-pay | Admitting: Cardiology

## 2014-04-26 DIAGNOSIS — M7989 Other specified soft tissue disorders: Secondary | ICD-10-CM

## 2014-04-26 DIAGNOSIS — R609 Edema, unspecified: Secondary | ICD-10-CM

## 2014-04-26 NOTE — Telephone Encounter (Signed)
New message    Patient calling  For  referral to Dr. Joylene Igo at [facility].

## 2014-04-26 NOTE — Telephone Encounter (Signed)
Spoke to patient  Informed her Monica [name] will in next week.  She will give call next week.

## 2014-04-29 NOTE — Telephone Encounter (Signed)
Spoke with pt, the last time she saw dr Stanford Breed they talked about the swelling in her legs was not related to her heart and he recommended she may need to see a vein doctor. She would like a referral to dr dixon @ [facility] Will forward for dr Stanford Breed review and okay to refer

## 2014-04-29 NOTE — Telephone Encounter (Signed)
Ok  Monica [name]  

## 2014-04-29 NOTE — Telephone Encounter (Signed)
Spoke with pt, aware referral placed. She can call to schedule.

## 2014-05-07 ENCOUNTER — Other Ambulatory Visit: Payer: Self-pay

## 2014-05-07 DIAGNOSIS — R609 Edema, unspecified: Secondary | ICD-10-CM

## 2014-05-28 ENCOUNTER — Encounter: Payer: Self-pay | Admitting: Vascular Surgery

## 2014-05-29 ENCOUNTER — Encounter: Payer: 59 | Admitting: Vascular Surgery

## 2014-05-29 ENCOUNTER — Encounter (HOSPITAL_COMMUNITY): Payer: 59

## 2014-06-04 ENCOUNTER — Other Ambulatory Visit: Payer: Self-pay | Admitting: Cardiology

## 2014-06-18 ENCOUNTER — Encounter: Payer: Self-pay | Admitting: Vascular Surgery

## 2014-06-19 ENCOUNTER — Encounter: Payer: Self-pay | Admitting: Vascular Surgery

## 2014-06-19 ENCOUNTER — Ambulatory Visit (HOSPITAL_COMMUNITY)
Admission: RE | Admit: 2014-06-19 | Discharge: 2014-06-19 | Disposition: A | Discharge status: Home / Self Care | Payer: 59 | Source: Ambulatory Visit | Attending: Vascular Surgery | Admitting: Vascular Surgery

## 2014-06-19 ENCOUNTER — Ambulatory Visit (INDEPENDENT_AMBULATORY_CARE_PROVIDER_SITE_OTHER): Payer: 59 | Admitting: Vascular Surgery

## 2014-06-19 VITALS — BP 125/74 | HR 104 | Ht 65.0 in | Wt 285.0 lb

## 2014-06-19 DIAGNOSIS — I890 Lymphedema, not elsewhere classified: Secondary | ICD-10-CM

## 2014-06-19 DIAGNOSIS — R609 Edema, unspecified: Secondary | ICD-10-CM | POA: Diagnosis not present

## 2014-06-19 DIAGNOSIS — M7989 Other specified soft tissue disorders: Secondary | ICD-10-CM

## 2014-06-19 NOTE — Progress Notes (Signed)
Patient ID: Monica [name], female   DOB: [birth year], 58 y.o.   MRN: [id number]  Reason for Consult: Bilateral lower extremity swelling.   Referred by Dr. Kirk Ruths  Subjective:     HPI:  Monica [name] is a 58 y.o. female who states that she has had swelling in both lower extremities for approximately 20 years. 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 She is unaware of any previous history of DVT or phlebitis. She's had no abdominal surgery, inguinal surgery, or radiation therapy that would be a potential cause of secondary lymphedema. She has had a hip replacement on the right although this was via a lateral approach.  Past Medical History  Diagnosis Date  . Atrial fibrillation   . Right hip pain   . Hypertension   . Morbid obesity   . Psoriasis     active breakout left buttocks  . Shortness of breath   . Osteoarthritis   . Avascular necrosis of bone of right hip   . Tachycardia, unspecified   . Personal history of colonic polyps - large hyperplastic 12/25/2013   Family History  Problem Relation Age of Onset  . Hypertension Mother   . Diabetes Mother   . Alzheimer's disease Mother   . Thyroid disease Mother   . Diabetes Son    Past Surgical History  Procedure Laterality Date  . Total hip arthroplasty  07/21/2012    Procedure: TOTAL HIP ARTHROPLASTY;  Surgeon: [name], MD;  Location: [facility];  Service: Orthopedics;  Laterality: Right;  . Fracture surgery  07/21/12    right hip arthroplasty  . Cardioversion N/A 01/09/2013    Procedure: CARDIOVERSION;  Surgeon: [name], MD;  Location: [facility] ENDOSCOPY;  Service: Cardiovascular;  Laterality: N/A;    Short Social History:  History  Substance Use Topics  . Smoking status: Former Smoker    Types: Cigarettes    Quit date: 09/13/1982  . Smokeless tobacco: Never Used  . Alcohol Use: 0.0 oz/week    0 Not specified per week     Comment: Occasionally    No Known  Allergies  Current Outpatient Prescriptions  Medication Sig Dispense Refill  . CELEBREX 200 MG capsule TAKE 1 CAPSULE BY MOUTH 2 TIMES DAILY. 60 capsule 5  . diclofenac sodium (VOLTAREN) 1 % GEL Apply 2-4 grams to left arm 1-2 times per day 5 Tube 1  . etanercept (ENBREL) 50 MG/ML injection Inject 50 mg into the skin once a week.    . fluticasone (FLONASE) 50 MCG/ACT nasal spray Place 2 sprays into both nostrils daily. 16 g 11  . furosemide (LASIX) 40 MG tablet Take 1 tablet (40 mg total) by mouth 2 (two) times daily. 180 tablet 3  . Multiple Vitamins-Minerals (MULTIVITAMIN WITH MINERALS) tablet Take 1 tablet by mouth daily with supper.    . nebivolol (BYSTOLIC) 10 MG tablet Take 1 tablet (10 mg total) by mouth daily. 30 tablet 5  . pantoprazole (PROTONIX) 40 MG tablet Take 1 tablet (40 mg total) by mouth daily before breakfast. 30 tablet 11  . potassium chloride (K-DUR,KLOR-CON) 10 MEQ tablet Take 1 tablet (10 mEq total) by mouth 2 (two) times daily. 180 tablet 3  . Vitamin D, Ergocalciferol, (DRISDOL) 50000 UNITS CAPS capsule Take 1 capsule (50,000 Units total) by mouth 2 (two) times a week. 8 capsule 11  . XARELTO 20 MG TABS tablet TAKE 1 TABLET (20 MG TOTAL)  BY MOUTH DAILY WITH SUPPER. 30 tablet 0  . cyclobenzaprine (FLEXERIL) 5 MG tablet Take 1 tablet (5 mg total) by mouth 3 (three) times daily as needed for muscle spasms. 30 tablet 1  . traMADol (ULTRAM) 50 MG tablet Take 1 tablet (50 mg total) by mouth every 8 (eight) hours as needed. 30 tablet 0   No current facility-administered medications for this visit.    Review of Systems  Constitutional: Negative for chills and fever.  Eyes: Negative for loss of vision.  Respiratory: Negative for cough and wheezing.  Cardiovascular: Positive for dyspnea with exertion and leg swelling. Negative for chest pain, chest tightness, orthopnea and palpitations.  GI: Negative for blood in stool and vomiting.  GU: Negative for difficulty urinating,  dysuria and hematuria.  Musculoskeletal: Negative for leg pain, joint pain and myalgias.  Skin: Positive for rash. Negative for wound.  Neurological: Negative for dizziness and speech difficulty.  Hematologic: Negative for bruises/bleeds easily. Psychiatric: Negative for depressed mood.        Objective:  Objective  Filed Vitals:   06/19/14 1402  BP: 125/74  Pulse: 104  Height: 5\' 5"  (1.651 m)  Weight: 285 lb (129.275 kg)  SpO2: 96%   Body mass index is 47.43 kg/(m^2).  Physical Exam  Constitutional: She is oriented to person, place, and time. She appears well-developed and well-nourished.  HENT:  Head: Normocephalic and atraumatic.  Neck: Neck supple. No JVD present. No thyromegaly present.  Cardiovascular: Normal rate, regular rhythm and normal heart sounds.  Exam reveals no friction rub.   No murmur heard. Pulmonary/Chest: Breath sounds normal. She has no wheezes. She has no rales.  Abdominal: Soft. Bowel sounds are normal. There is no tenderness.  Musculoskeletal: Normal range of motion. She exhibits edema.  She has bilateral moderate lower extremity edema with nonpitting edema on the dorsum of both feet consistent with lymphedema.  Lymphadenopathy:    She has no cervical adenopathy.  Neurological: She is alert and oriented to person, place, and time. She has normal strength. No sensory deficit.  Skin: No lesion and no rash noted.  Psychiatric: She has a normal mood and affect.    Data: VENOUS DUPLEX: I have independently interpreted her venous duplex scan. There is no evidence of DVT bilaterally. She has reflux in both common femoral veins. There is no evidence of superficial venous thrombosis. There is no reflux in the greater saphenous vein or small saphenous vein bilaterally.   I have reviewed her records from Dr. [name] office.  He did not feel that her swelling is related to heart failure.     Assessment/Plan:     Lymphedema This patient's exam is  consistent with bilateral lower extremity lymphedema. I have discussed with her the importance of intermittent leg elevation in the proper positioning for this. In addition I have written her a prescription for knee-high compression stockings with a gradient of 15-20 mmHg. I've encouraged her to elevate her legs daily and to wear her compression stockings at work. If her swelling worsens she could be considered for massage therapy by a certified lymphedema specialist although these appointments are hard to come by. I reassured her that this was not anything serious or life-threatening but that lymphedema was a chronic problem. She does have evidence of some mild venous insufficiency with reflux noted in the common femoral veins bilaterally. However otherwise her venous exam was unremarkable. Regardless, I explained that the treatment for venous insufficiency is the same, that is elevation  and compression therapy. I also reassured her that she had no evidence of DVT bilaterally. I'll be happy to see her back at any time if her swelling worsens.   Angelia Mould MD [facility]

## 2014-06-19 NOTE — Assessment & Plan Note (Signed)
This patient's exam is consistent with bilateral lower extremity lymphedema. I have discussed with her the importance of intermittent leg elevation in the proper positioning for this. In addition I have written her a prescription for knee-high compression stockings with a gradient of 15-20 mmHg. I've encouraged her to elevate her legs daily and to wear her compression stockings at work. If her swelling worsens she could be considered for massage therapy by a certified lymphedema specialist although these appointments are hard to come by. I reassured her that this was not anything serious or life-threatening but that lymphedema was a chronic problem. She does have evidence of some mild venous insufficiency with reflux noted in the common femoral veins bilaterally. However otherwise her venous exam was unremarkable. Regardless, I explained that the treatment for venous insufficiency is the same, that is elevation and compression therapy. I also reassured her that she had no evidence of DVT bilaterally. I'll be happy to see her back at any time if her swelling worsens.

## 2014-07-04 ENCOUNTER — Other Ambulatory Visit: Payer: Self-pay | Admitting: Cardiology

## 2014-07-31 ENCOUNTER — Other Ambulatory Visit: Payer: Self-pay | Admitting: Cardiology

## 2014-08-13 ENCOUNTER — Other Ambulatory Visit (HOSPITAL_BASED_OUTPATIENT_CLINIC_OR_DEPARTMENT_OTHER): Payer: Self-pay | Admitting: Family Medicine

## 2014-08-13 DIAGNOSIS — Z1231 Encounter for screening mammogram for malignant neoplasm of breast: Secondary | ICD-10-CM

## 2014-08-19 ENCOUNTER — Ambulatory Visit (HOSPITAL_BASED_OUTPATIENT_CLINIC_OR_DEPARTMENT_OTHER): Payer: 59

## 2014-09-02 ENCOUNTER — Other Ambulatory Visit: Payer: Self-pay | Admitting: Cardiology

## 2014-09-30 ENCOUNTER — Other Ambulatory Visit: Payer: Self-pay | Admitting: Cardiology

## 2014-09-30 NOTE — Telephone Encounter (Signed)
Rx has been sent to the pharmacy electronically. ° °

## 2014-10-02 ENCOUNTER — Ambulatory Visit (INDEPENDENT_AMBULATORY_CARE_PROVIDER_SITE_OTHER): Payer: 59 | Admitting: Cardiology

## 2014-10-02 ENCOUNTER — Other Ambulatory Visit: Payer: Self-pay | Admitting: Cardiology

## 2014-10-02 ENCOUNTER — Encounter: Payer: Self-pay | Admitting: Cardiology

## 2014-10-02 ENCOUNTER — Encounter: Payer: Self-pay | Admitting: *Deleted

## 2014-10-02 VITALS — BP 154/89 | HR 87 | Ht 65.0 in | Wt 284.0 lb

## 2014-10-02 DIAGNOSIS — I10 Essential (primary) hypertension: Secondary | ICD-10-CM

## 2014-10-02 DIAGNOSIS — I480 Paroxysmal atrial fibrillation: Secondary | ICD-10-CM

## 2014-10-02 MED ORDER — FLECAINIDE ACETATE 50 MG PO TABS: 50.0000 mg | ORAL_TABLET | Freq: Two times a day (BID) | ORAL | Status: DC

## 2014-10-02 NOTE — Assessment & Plan Note (Signed)
Blood pressure mildly elevated. We will follow and increase medications as needed.

## 2014-10-02 NOTE — Patient Instructions (Signed)
Your physician recommends that you schedule a follow-up appointment in: [place]  START FLECAINIDE 50 MG ONCE TABLET TWICE DAILY  Your physician has requested that you have an echocardiogram. Echocardiography is a painless test that uses sound waves to create images of your heart. It provides your doctor with information about the size and shape of your heart and how well your heart's chambers and valves are working. This procedure takes approximately one hour. There are no restrictions for this procedure.AT [place]  Your physician has recommended that you have a Cardioversion (DCCV). Electrical Cardioversion uses a jolt of electricity to your heart either through paddles or wired patches attached to your chest. This is a controlled, usually prescheduled, procedure. Defibrillation is done under light anesthesia in the hospital, and you usually go home the day of the procedure. This is done to get your heart back into a normal rhythm. You are not awake for the procedure. Please see the instruction sheet given to you today.   Your physician has requested that you have an exercise tolerance test. For further information please visit [email or web address]. Please also follow instruction sheet, as given.

## 2014-10-02 NOTE — Progress Notes (Signed)
HPI: FU atrial fibrillation. Echocardiogram in November 2013 showed an ejection fraction of 50-55%, moderate left atrial enlargement, mild right atrial enlargement and mildly elevated pulmonary pressures. Patient underwent cardioversion on 01/09/2013 to sinus rhythm. TSH in May of 2014 1.283. Venous Dopplers November 2015 showed no DVT. Since I last saw her, she notes increased dyspnea on exertion. No orthopnea or PND. Chronic unchanged pedal edema. No chest pain, palpitations or syncope.  Current Outpatient Prescriptions  Medication Sig Dispense Refill  . CELEBREX 200 MG capsule TAKE 1 CAPSULE BY MOUTH 2 TIMES DAILY. (Patient taking differently: TAKE 1 CAPSULE BY MOUTH ONCE DAILY.) 60 capsule 5  . cyclobenzaprine (FLEXERIL) 5 MG tablet Take 1 tablet (5 mg total) by mouth 3 (three) times daily as needed for muscle spasms. 30 tablet 1  . etanercept (ENBREL) 50 MG/ML injection Inject 50 mg into the skin once a week.    . fluticasone (FLONASE) 50 MCG/ACT nasal spray Place 2 sprays into both nostrils daily. (Patient taking differently: Place 2 sprays into both nostrils as needed. ) 16 g 11  . furosemide (LASIX) 40 MG tablet Take 1 tablet (40 mg total) by mouth 2 (two) times daily. 180 tablet 3  . Multiple Vitamins-Minerals (MULTIVITAMIN WITH MINERALS) tablet Take 1 tablet by mouth daily with supper.    . nebivolol (BYSTOLIC) 10 MG tablet Take 1 tablet (10 mg total) by mouth daily. 30 tablet 5  . pantoprazole (PROTONIX) 40 MG tablet Take 1 tablet (40 mg total) by mouth daily before breakfast. 30 tablet 11  . potassium chloride (K-DUR,KLOR-CON) 10 MEQ tablet Take 1 tablet (10 mEq total) by mouth 2 (two) times daily. (Patient taking differently: Take 10 mEq by mouth daily. ) 180 tablet 3  . traMADol (ULTRAM) 50 MG tablet Take 1 tablet (50 mg total) by mouth every 8 (eight) hours as needed. 30 tablet 0  . Vitamin D, Ergocalciferol, (DRISDOL) 50000 UNITS CAPS capsule Take 1 capsule (50,000 Units  total) by mouth 2 (two) times a week. 8 capsule 11  . XARELTO 20 MG TABS tablet TAKE 1 TABLET (20 MG) BY MOUTH DAILY WITH SUPPER**NEED APPOINTMENT FOR FURTHER REFILLS** 30 tablet 2   No current facility-administered medications for this visit.     Past Medical History  Diagnosis Date  . Atrial fibrillation   . Right hip pain   . Hypertension   . Morbid obesity   . Psoriasis     active breakout left buttocks  . Shortness of breath   . Osteoarthritis   . Avascular necrosis of bone of right hip   . Tachycardia, unspecified   . Personal history of colonic polyps - large hyperplastic 12/25/2013    Past Surgical History  Procedure Laterality Date  . Total hip arthroplasty  07/21/2012    Procedure: TOTAL HIP ARTHROPLASTY;  Surgeon: [name], MD;  Location: [facility];  Service: Orthopedics;  Laterality: Right;  . Fracture surgery  07/21/12    right hip arthroplasty  . Cardioversion N/A 01/09/2013    Procedure: CARDIOVERSION;  Surgeon: [name], MD;  Location: [facility] ENDOSCOPY;  Service: Cardiovascular;  Laterality: N/A;    History   Social History  . Marital Status: Divorced    Spouse Name: [name]  . Number of Children: 1  . Years of Education: 12   Occupational History  . [employer]    Social History Main Topics  . Smoking status: Former  Smoker    Types: Cigarettes    Quit date: 09/13/1982  . Smokeless tobacco: Never Used  . Alcohol Use: 0.0 oz/week    0 Standard drinks or equivalent per week     Comment: Occasionally  . Drug Use: No  . Sexual Activity:    Partners: Male   Other Topics Concern  . 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  She used to smoke  4 cigarettes per day for five years and quit 32 years ago.    Alcohol Use:  Occasional   Drug Use:  None   Diet:  Regular   Exercise:  Water Aerobics (2 x per week)     Hobbies: Shopping, Cycling              ROS: no fevers or chills, productive cough, hemoptysis, dysphasia, odynophagia, melena, hematochezia, dysuria, hematuria, rash, seizure activity, orthopnea, PND, claudication. Remaining systems are negative.  Physical Exam: Well-developed obese in no acute distress.  Skin is warm and dry.  HEENT is normal.  Neck is supple.  Chest is clear to auscultation with normal expansion.  Cardiovascular exam is irregular Abdominal exam nontender or distended. No masses palpated. Extremities show 1+ edema. neuro grossly intact  ECG atrial fibrillation with PVCs or aberrantly conducted beats. Nonspecific ST-T changes.

## 2014-10-02 NOTE — Assessment & Plan Note (Signed)
Patient has developed recurrent atrial fibrillation. She is symptomatic with increased dyspnea on exertion. Continue beta blocker for rate control. Continue xarelto and she has not missed any doses. We will obtain her most recent hemoglobin and renal function from Dr. Ronnald Ramp who she states her blood recently. Given that she is symptomatic we will try and reestablish sinus rhythm. Begin flecainide 50 mg by mouth twice a day. Proceed with cardioversion early next week. Proceed with exercise treadmill in 1 week to exclude exercise-induced arrhythmias related to initiation of flecainide. Follow-up 8 weeks. Repeat echocardiogram.

## 2014-10-02 NOTE — Assessment & Plan Note (Signed)
Continue present dose of Lasix. Await laboratories drawn recently by Dr. Ronnald Ramp.

## 2014-10-07 ENCOUNTER — Ambulatory Visit (HOSPITAL_COMMUNITY): Payer: 59 | Admitting: Certified Registered Nurse Anesthetist

## 2014-10-07 ENCOUNTER — Ambulatory Visit (HOSPITAL_COMMUNITY)
Admission: RE | Admit: 2014-10-07 | Discharge: 2014-10-07 | Disposition: A | Discharge status: Home / Self Care | Payer: 59 | Source: Ambulatory Visit | Attending: Cardiology | Admitting: Cardiology

## 2014-10-07 ENCOUNTER — Encounter (HOSPITAL_COMMUNITY)
Admission: RE | Disposition: A | Discharge status: Home / Self Care | Payer: Self-pay | Source: Ambulatory Visit | Attending: Cardiology

## 2014-10-07 DIAGNOSIS — Z86010 Personal history of colonic polyps: Secondary | ICD-10-CM | POA: Diagnosis not present

## 2014-10-07 DIAGNOSIS — Z96641 Presence of right artificial hip joint: Secondary | ICD-10-CM | POA: Insufficient documentation

## 2014-10-07 DIAGNOSIS — I480 Paroxysmal atrial fibrillation: Secondary | ICD-10-CM

## 2014-10-07 DIAGNOSIS — M87851 Other osteonecrosis, right femur: Secondary | ICD-10-CM | POA: Diagnosis not present

## 2014-10-07 DIAGNOSIS — L409 Psoriasis, unspecified: Secondary | ICD-10-CM | POA: Diagnosis not present

## 2014-10-07 DIAGNOSIS — Z87891 Personal history of nicotine dependence: Secondary | ICD-10-CM | POA: Insufficient documentation

## 2014-10-07 DIAGNOSIS — Z6841 Body Mass Index (BMI) 40.0 and over, adult: Secondary | ICD-10-CM | POA: Diagnosis not present

## 2014-10-07 DIAGNOSIS — I10 Essential (primary) hypertension: Secondary | ICD-10-CM | POA: Diagnosis not present

## 2014-10-07 HISTORY — PX: CARDIOVERSION: SHX1299

## 2014-10-07 LAB — POCT I-STAT 4, (NA,K, GLUC, HGB,HCT)
Glucose, Bld: 130 mg/dL — ABNORMAL HIGH (ref 70–99)
HCT: 36 % (ref 36.0–46.0)
Hemoglobin: 12.2 g/dL (ref 12.0–15.0)
Potassium: 3.8 mmol/L (ref 3.5–5.1)
Sodium: 144 mmol/L (ref 135–145)

## 2014-10-07 SURGERY — CARDIOVERSION
Anesthesia: Monitor Anesthesia Care

## 2014-10-07 MED ORDER — LIDOCAINE HCL (CARDIAC) 20 MG/ML IV SOLN
INTRAVENOUS | Status: DC | PRN
  Administered 2014-10-07: 60 mg via INTRAVENOUS

## 2014-10-07 MED ORDER — SODIUM CHLORIDE 0.9 % IV SOLN
INTRAVENOUS | Status: DC
  Administered 2014-10-07: 500 mL via INTRAVENOUS

## 2014-10-07 MED ORDER — PROPOFOL 10 MG/ML IV BOLUS
INTRAVENOUS | Status: DC | PRN
  Administered 2014-10-07: 40 ug via INTRAVENOUS
  Administered 2014-10-07 (×2): 50 ug via INTRAVENOUS

## 2014-10-07 NOTE — Discharge Instructions (Signed)

## 2014-10-07 NOTE — Procedures (Signed)
Electrical Cardioversion Procedure Note Monica [name] [id number] Dec 27, 1955  Procedure: Electrical Cardioversion Indications:  Atrial Fibrillation  Procedure Details Consent: Risks of procedure as well as the alternatives and risks of each were explained to the (patient/caregiver).  Consent for procedure obtained. Time Out: Verified patient identification, verified procedure, site/side was marked, verified correct patient position, special equipment/implants available, medications/allergies/relevent history reviewed, required imaging and test results available.  Performed  Patient placed on cardiac monitor, pulse oximetry, supplemental oxygen as necessary.  Sedation given: Patient sedated by anesthesia with lidocaine 60 mg and diprovan 140 mg IV. Pacer pads placed anterior and posterior chest.  Cardioverted 2 time(s).  Cardioverted at 120; afib persisted; second attempt with 150J resulted in sinus rhythm.  Evaluation Findings: Post procedure EKG shows: NSR Complications: None Patient did tolerate procedure well.   Kirk Ruths 10/07/2014, 8:51 AM

## 2014-10-07 NOTE — Anesthesia Postprocedure Evaluation (Signed)
  Anesthesia Post-op Note  Patient: Monica [name]  Procedure(s) Performed: Procedure(s) with comments: CARDIOVERSION (N/A) - 09:00 Lido 60mg ,IV followed by Propofol  70mg /IV    synched electrocardioversion at 120 joules for Afib,repeated at 200 joules, 70 mg...successfully changed to SR  Patient Location: PACU  Anesthesia Type:MAC  Level of Consciousness: awake  Airway and Oxygen Therapy: Patient Spontanous Breathing  Post-op Pain: none  Post-op Assessment: Post-op Vital signs reviewed, Patient's Cardiovascular Status Stable, Respiratory Function Stable, Patent Airway, No signs of Nausea or vomiting and Pain level controlled  Post-op Vital Signs: Reviewed and stable  Last Vitals:  Filed Vitals:   10/07/14 0940  BP: 112/88  Pulse: 72  Temp:   Resp: 18    Complications: No apparent anesthesia complications

## 2014-10-07 NOTE — Transfer of Care (Signed)
Immediate Anesthesia Transfer of Care Note  Patient: Monica [name]  Procedure(s) Performed: Procedure(s) with comments: CARDIOVERSION (N/A) - 09:00 Lido 60mg ,IV followed by Propofol  70mg /IV    synched electrocardioversion at 120 joules for Afib,repeated at 200 joules, 70 mg...successfully changed to SR  Patient Location: Endoscopy Unit  Anesthesia Type:MAC  Level of Consciousness: awake, alert  and oriented  Airway & Oxygen Therapy: Patient Spontanous Breathing and Patient connected to nasal cannula oxygen  Post-op Assessment: Report given to RN and Post -op Vital signs reviewed and stable  Post vital signs: Reviewed and stable  Last Vitals:  Filed Vitals:   10/07/14 0845  BP: 177/124  Pulse:   Temp:   Resp:     Complications: No apparent anesthesia complications

## 2014-10-07 NOTE — H&P (Signed)
[place]  10/02/2014 9:00 AM  Office Visit  MRN:  [id number]   Description: Female DOB: [birth year]  Provider: [name], MD  Department: [facility]       Vital Signs  Most recent update: 10/02/2014 9:16 AM by Venetia Maxon, CMA    BP Pulse Ht Wt BMI LMP    154/89 mmHg 87 5\' 5"  (1.651 m) 284 lb (128.822 kg) 47.26 kg/m2 06/19/2013    Vitals History     Progress Notes      [name], MD at 10/02/2014 7:10 AM     Status: Signed       Expand All Collapse All        HPI: FU atrial fibrillation. Echocardiogram in November 2013 showed an ejection fraction of 50-55%, moderate left atrial enlargement, mild right atrial enlargement and mildly elevated pulmonary pressures. Patient underwent cardioversion on 01/09/2013 to sinus rhythm. TSH in May of 2014 1.283. Venous Dopplers November 2015 showed no DVT. Since I last saw her, she notes increased dyspnea on exertion. No orthopnea or PND. Chronic unchanged pedal edema. No chest pain, palpitations or syncope.  Current Outpatient Prescriptions  Medication Sig Dispense Refill  . CELEBREX 200 MG capsule TAKE 1 CAPSULE BY MOUTH 2 TIMES DAILY. (Patient taking differently: TAKE 1 CAPSULE BY MOUTH ONCE DAILY.) 60 capsule 5  . cyclobenzaprine (FLEXERIL) 5 MG tablet Take 1 tablet (5 mg total) by mouth 3 (three) times daily as needed for muscle spasms. 30 tablet 1  . etanercept (ENBREL) 50 MG/ML injection Inject 50 mg into the skin once a week.    . fluticasone (FLONASE) 50 MCG/ACT nasal spray Place 2 sprays into both nostrils daily. (Patient taking differently: Place 2 sprays into both nostrils as needed. ) 16 g 11  . furosemide (LASIX) 40 MG tablet Take 1 tablet (40 mg total) by mouth 2 (two) times daily. 180 tablet 3  . Multiple Vitamins-Minerals (MULTIVITAMIN WITH MINERALS) tablet Take 1 tablet by mouth daily with supper.    . nebivolol (BYSTOLIC) 10 MG tablet Take 1 tablet (10 mg  total) by mouth daily. 30 tablet 5  . pantoprazole (PROTONIX) 40 MG tablet Take 1 tablet (40 mg total) by mouth daily before breakfast. 30 tablet 11  . potassium chloride (K-DUR,KLOR-CON) 10 MEQ tablet Take 1 tablet (10 mEq total) by mouth 2 (two) times daily. (Patient taking differently: Take 10 mEq by mouth daily. ) 180 tablet 3  . traMADol (ULTRAM) 50 MG tablet Take 1 tablet (50 mg total) by mouth every 8 (eight) hours as needed. 30 tablet 0  . Vitamin D, Ergocalciferol, (DRISDOL) 50000 UNITS CAPS capsule Take 1 capsule (50,000 Units total) by mouth 2 (two) times a week. 8 capsule 11  . XARELTO 20 MG TABS tablet TAKE 1 TABLET (20 MG) BY MOUTH DAILY WITH SUPPER**NEED APPOINTMENT FOR FURTHER REFILLS** 30 tablet 2   No current facility-administered medications for this visit.     Past Medical History  Diagnosis Date  . Atrial fibrillation   . Right hip pain   . Hypertension   . Morbid obesity   . Psoriasis     active breakout left buttocks  . Shortness of breath   . Osteoarthritis   . Avascular necrosis of bone of right hip   . Tachycardia, unspecified   . Personal history of colonic polyps - large hyperplastic 12/25/2013    Past Surgical History  Procedure Laterality Date  . Total hip arthroplasty  07/21/2012    Procedure:  TOTAL HIP ARTHROPLASTY; Surgeon: [name], MD; Location: [facility]; Service: Orthopedics; Laterality: Right;  . Fracture surgery  07/21/12    right hip arthroplasty  . Cardioversion N/A 01/09/2013    Procedure: CARDIOVERSION; Surgeon: [name], MD; Location: [facility] ENDOSCOPY; Service: Cardiovascular; Laterality: N/A;    History   Social History  . Marital Status: Divorced    Spouse Name: [name]  . Number of Children: 1  . Years of Education: 12   Occupational History  . [employer]     Social History Main Topics  . Smoking status: Former Smoker    Types: Cigarettes    Quit date: 09/13/1982  . Smokeless tobacco: Never Used  . Alcohol Use: 0.0 oz/week    0 Standard drinks or equivalent per week     Comment: Occasionally  . Drug Use: No  . Sexual Activity:    Partners: Male   Other Topics Concern  . 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 She used to smoke 4 cigarettes per day for five years and quit 32 years ago.    Alcohol Use: Occasional   Drug Use: None   Diet: Regular   Exercise: Water Aerobics (2 x per week)    Hobbies: Shopping, Cycling              ROS: no fevers or chills, productive cough, hemoptysis, dysphasia, odynophagia, melena, hematochezia, dysuria, hematuria, rash, seizure activity, orthopnea, PND, claudication. Remaining systems are negative.  Physical Exam: Well-developed obese in no acute distress.  Skin is warm and dry.  HEENT is normal.  Neck is supple.  Chest is clear to auscultation with normal expansion.  Cardiovascular exam is irregular Abdominal exam nontender or distended. No masses palpated. Extremities show 1+ edema. neuro grossly intact  ECG atrial fibrillation with PVCs or aberrantly conducted beats. Nonspecific ST-T changes.                Atrial fibrillation - [name], MD at 10/02/2014 9:31 AM     Status: Written Related Problem: Atrial fibrillation   Expand All Collapse All   Patient has developed recurrent atrial fibrillation. She is symptomatic with increased dyspnea on exertion. Continue beta blocker for rate control. Continue xarelto  and she has not missed any doses. We will obtain her most recent hemoglobin and renal function from Dr. Ronnald Ramp who she states her blood recently. Given that she is symptomatic we will try and reestablish sinus rhythm. Begin flecainide 50 mg by mouth twice a day. Proceed with cardioversion early next week. Proceed with exercise treadmill in 1 week to exclude exercise-induced arrhythmias related to initiation of flecainide. Follow-up 8 weeks. Repeat echocardiogram.            Edema - [name], MD at 10/02/2014 9:31 AM     Status: Written Related Problem: Edema   Expand All Collapse All   Continue present dose of Lasix. Await laboratories drawn recently by Dr. Ronnald Ramp.            Essential hypertension, benign - [name], MD at 10/02/2014 9:31 AM     Status: Written Related Problem:  Essential hypertension, benign   Expand All Collapse All   Blood pressure mildly elevated. We will follow and increase medications as needed.       For DCCV; no changes; patient has been taking xarelto. Kirk Ruths

## 2014-10-07 NOTE — Anesthesia Preprocedure Evaluation (Signed)
Anesthesia Evaluation  Patient identified by MRN, date of birth, ID band Patient awake    Reviewed: Allergy & Precautions, H&P , NPO status , Patient's Chart, lab work & pertinent test results  Airway Mallampati: I  TM Distance: >3 FB Neck ROM: full    Dental  (+) Teeth Intact, Dental Advisory Given   Pulmonary former smoker,          Cardiovascular hypertension, + dysrhythmias Atrial Fibrillation     Neuro/Psych  Headaches,    GI/Hepatic   Endo/Other    Renal/GU      Musculoskeletal   Abdominal   Peds  Hematology   Anesthesia Other Findings   Reproductive/Obstetrics                             Anesthesia Physical  Anesthesia Plan  ASA: III  Anesthesia Plan: MAC   Post-op Pain Management:    Induction: Intravenous  Airway Management Planned: Mask  Additional Equipment:   Intra-op Plan:   Post-operative Plan:   Informed Consent: I have reviewed the patients History and Physical, chart, labs and discussed the procedure including the risks, benefits and alternatives for the proposed anesthesia with the patient or authorized representative who has indicated his/her understanding and acceptance.   Dental advisory given  Plan Discussed with: CRNA, Anesthesiologist and Surgeon  Anesthesia Plan Comments:         Anesthesia Quick Evaluation

## 2014-10-08 ENCOUNTER — Encounter (HOSPITAL_COMMUNITY): Payer: Self-pay | Admitting: Cardiology

## 2014-10-09 ENCOUNTER — Other Ambulatory Visit (HOSPITAL_COMMUNITY): Payer: 59

## 2014-10-11 ENCOUNTER — Ambulatory Visit (HOSPITAL_COMMUNITY)
Admission: RE | Admit: 2014-10-11 | Discharge: 2014-10-11 | Disposition: A | Discharge status: Home / Self Care | Payer: 59 | Source: Ambulatory Visit | Attending: Cardiology | Admitting: Cardiology

## 2014-10-11 ENCOUNTER — Telehealth (HOSPITAL_COMMUNITY): Payer: Self-pay | Admitting: Unknown Physician Specialty

## 2014-10-11 DIAGNOSIS — I10 Essential (primary) hypertension: Secondary | ICD-10-CM

## 2014-10-11 DIAGNOSIS — R609 Edema, unspecified: Secondary | ICD-10-CM

## 2014-10-11 DIAGNOSIS — I081 Rheumatic disorders of both mitral and tricuspid valves: Secondary | ICD-10-CM | POA: Insufficient documentation

## 2014-10-11 DIAGNOSIS — I480 Paroxysmal atrial fibrillation: Secondary | ICD-10-CM | POA: Diagnosis not present

## 2014-10-11 DIAGNOSIS — I4891 Unspecified atrial fibrillation: Secondary | ICD-10-CM

## 2014-10-11 NOTE — Progress Notes (Signed)
*  PRELIMINARY RESULTS* Echocardiogram 2D Echocardiogram has been performed.  Monica [name] 10/11/2014, 3:32 PM

## 2014-10-24 ENCOUNTER — Telehealth: Payer: Self-pay | Admitting: Cardiology

## 2014-10-24 NOTE — Telephone Encounter (Signed)
Spoke to pt. Has had problems w/ cramping/loose stools x2 weeks since starting Flecainide. Wanted recommendation on med or dose chg. Defer to [facility]    -Echo results discussed. -Also inquiring on handicapped placard. Advised to fill out application & send to our office & we can review.

## 2014-10-24 NOTE — Telephone Encounter (Signed)
Would continue flecanide for now, if diarrhea persists can then dc Would not give handicap Monica [name]

## 2014-10-24 NOTE — Telephone Encounter (Signed)
(  1)Pt called in stating that Dr. Stanford Breed put her on Flecinaide 2wks ago and since then she has been having really bad diarrhea as a side effect . She is wanting to know if there is another medication she could take that will not have this effect. (2)She would like to know the results to the stress test and echo she had done on 2/26. (3) She would like to inquire about getting a handicapped sticker Please f/u  Thanks

## 2014-11-27 ENCOUNTER — Encounter: Payer: 59 | Admitting: Cardiology

## 2014-11-27 NOTE — Progress Notes (Signed)
HPI: FU atrial fibrillation. Venous Dopplers November 2015 showed no DVT. Recently seen and noted to be in recurrent atrial fibrillation. Flecainide initiated. Echocardiogram February 2016 showed normal LV function, severe left atrial enlargement, mild right atrial enlargement, mild to moderate mitral regurgitation and moderate tricuspid regurgitation. Moderately elevated pulmonary pressure. Had cardioversion February 2016. Follow-up exercise treadmill showed poor exercise tolerance but no exercise-induced ventricular arrhythmias.Since I last saw her,   Current Outpatient Prescriptions  Medication Sig Dispense Refill  . CELEBREX 200 MG capsule TAKE 1 CAPSULE BY MOUTH 2 TIMES DAILY. (Patient taking differently: TAKE 1 CAPSULE BY MOUTH ONCE DAILY.) 60 capsule 5  . cyclobenzaprine (FLEXERIL) 5 MG tablet Take 1 tablet (5 mg total) by mouth 3 (three) times daily as needed for muscle spasms. 30 tablet 1  . etanercept (ENBREL) 50 MG/ML injection Inject 50 mg into the skin once a week.    . flecainide (TAMBOCOR) 50 MG tablet Take 1 tablet (50 mg total) by mouth 2 (two) times daily. 180 tablet 3  . fluticasone (FLONASE) 50 MCG/ACT nasal spray Place 2 sprays into both nostrils daily. (Patient taking differently: Place 2 sprays into both nostrils as needed. ) 16 g 11  . furosemide (LASIX) 40 MG tablet Take 1 tablet (40 mg total) by mouth 2 (two) times daily. 180 tablet 3  . Multiple Vitamins-Minerals (MULTIVITAMIN WITH MINERALS) tablet Take 1 tablet by mouth daily with supper.    . nebivolol (BYSTOLIC) 10 MG tablet Take 1 tablet (10 mg total) by mouth daily. 30 tablet 5  . pantoprazole (PROTONIX) 40 MG tablet Take 1 tablet (40 mg total) by mouth daily before breakfast. 30 tablet 11  . potassium chloride (K-DUR,KLOR-CON) 10 MEQ tablet Take 1 tablet (10 mEq total) by mouth 2 (two) times daily. (Patient taking differently: Take 10 mEq by mouth daily. ) 180 tablet 3  . traMADol (ULTRAM) 50 MG tablet Take 1  tablet (50 mg total) by mouth every 8 (eight) hours as needed. 30 tablet 0  . Vitamin D, Ergocalciferol, (DRISDOL) 50000 UNITS CAPS capsule Take 1 capsule (50,000 Units total) by mouth 2 (two) times a week. 8 capsule 11  . XARELTO 20 MG TABS tablet TAKE 1 TABLET (20 MG) BY MOUTH DAILY WITH SUPPER**NEED APPOINTMENT FOR FURTHER REFILLS** 30 tablet 2   No current facility-administered medications for this visit.     Past Medical History  Diagnosis Date  . Atrial fibrillation   . Right hip pain   . Hypertension   . Morbid obesity   . Psoriasis     active breakout left buttocks  . Shortness of breath   . Osteoarthritis   . Avascular necrosis of bone of right hip   . Tachycardia, unspecified   . Personal history of colonic polyps - large hyperplastic 12/25/2013    Past Surgical History  Procedure Laterality Date  . Total hip arthroplasty  07/21/2012    Procedure: TOTAL HIP ARTHROPLASTY;  Surgeon: [name], MD;  Location: [facility];  Service: Orthopedics;  Laterality: Right;  . Fracture surgery  07/21/12    right hip arthroplasty  . Cardioversion N/A 01/09/2013    Procedure: CARDIOVERSION;  Surgeon: [name], MD;  Location: [facility] ENDOSCOPY;  Service: Cardiovascular;  Laterality: N/A;  . Cardioversion N/A 10/07/2014    Procedure: CARDIOVERSION;  Surgeon: [name], MD;  Location: [facility] ENDOSCOPY;  Service: Cardiovascular;  Laterality: N/A;  09:00 Lido 60mg ,IV followed by Propofol  70mg /IV    synched  electrocardioversion at 120 joules for Afib,repeated at 200 joules, 70 mg...successfully changed to SR    History   Social History  . Marital Status: Divorced    Spouse Name: [name]  . Number of Children: 1  . Years of Education: 12   Occupational History  . [employer]    Social History Main Topics  . Smoking status: Former Smoker    Types: Cigarettes    Quit date: 09/13/1982  . Smokeless tobacco: Never Used  . Alcohol Use: 0.0  oz/week    0 Standard drinks or equivalent per week     Comment: Occasionally  . Drug Use: No  . Sexual Activity:    Partners: Male   Other Topics Concern  . 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  She used to smoke 4 cigarettes per day for five years and quit 32 years ago.    Alcohol Use:  Occasional   Drug Use:  None   Diet:  Regular   Exercise:  Water Aerobics (2 x per week)     Hobbies: Shopping, Cycling              ROS: no fevers or chills, productive cough, hemoptysis, dysphasia, odynophagia, melena, hematochezia, dysuria, hematuria, rash, seizure activity, orthopnea, PND, pedal edema, claudication. Remaining systems are negative.  Physical Exam: Well-developed well-nourished in no acute distress.  Skin is warm and dry.  HEENT is normal.  Neck is supple.  Chest is clear to auscultation with normal expansion.  Cardiovascular exam is regular rate and rhythm.  Abdominal exam nontender or distended. No masses palpated. Extremities show no edema. neuro grossly intact  ECG     This encounter was created in error - please disregard.

## 2014-12-30 ENCOUNTER — Other Ambulatory Visit: Payer: Self-pay | Admitting: Cardiology

## 2015-01-15 ENCOUNTER — Ambulatory Visit (INDEPENDENT_AMBULATORY_CARE_PROVIDER_SITE_OTHER): Payer: 59 | Admitting: Cardiology

## 2015-01-15 ENCOUNTER — Encounter: Payer: Self-pay | Admitting: Cardiology

## 2015-01-15 VITALS — BP 142/80 | HR 77 | Ht 65.5 in | Wt 278.0 lb

## 2015-01-15 DIAGNOSIS — I480 Paroxysmal atrial fibrillation: Secondary | ICD-10-CM | POA: Diagnosis not present

## 2015-01-15 DIAGNOSIS — G4730 Sleep apnea, unspecified: Secondary | ICD-10-CM

## 2015-01-15 NOTE — Progress Notes (Signed)
HPI: FU atrial fibrillation. Patient underwent cardioversion on 01/09/2013 to sinus rhythm. TSH in May of 2014 1.283. Venous Dopplers November 2015 showed no DVT. Patient recently developed recurrent atrial fibrillation and had a repeat cardioversion successfully on flecainide. Echocardiogram February 2016 showed normal LV function, mild to moderate mitral regurgitation, severe left atrial enlargement and mild right atrial enlargement. Moderate tricuspid regurgitation with moderately elevated pulmonary pressures. Follow-up exercise treadmill on flecanide did not show significant arrhythmia. Since I last saw her, she has improved. She does have dyspnea on exertion but no orthopnea or PND. Chronic pedal edema. No chest pain.  Current Outpatient Prescriptions  Medication Sig Dispense Refill  . CELEBREX 200 MG capsule TAKE 1 CAPSULE BY MOUTH 2 TIMES DAILY. (Patient taking differently: TAKE 1 CAPSULE BY MOUTH ONCE DAILY.) 60 capsule 5  . cyclobenzaprine (FLEXERIL) 5 MG tablet Take 1 tablet (5 mg total) by mouth 3 (three) times daily as needed for muscle spasms. 30 tablet 1  . etanercept (ENBREL) 50 MG/ML injection Inject 50 mg into the skin once a week.    . flecainide (TAMBOCOR) 50 MG tablet Take 1 tablet (50 mg total) by mouth 2 (two) times daily. 180 tablet 3  . fluticasone (FLONASE) 50 MCG/ACT nasal spray Place 2 sprays into both nostrils daily. (Patient taking differently: Place 2 sprays into both nostrils as needed. ) 16 g 11  . furosemide (LASIX) 40 MG tablet Take 1 tablet (40 mg total) by mouth 2 (two) times daily. (Patient taking differently: Take 40 mg by mouth daily. ) 180 tablet 3  . Multiple Vitamins-Minerals (MULTIVITAMIN WITH MINERALS) tablet Take 1 tablet by mouth daily with supper.    . nebivolol (BYSTOLIC) 10 MG tablet Take 1 tablet (10 mg total) by mouth daily. 30 tablet 5  . pantoprazole (PROTONIX) 40 MG tablet Take 1 tablet (40 mg total) by mouth daily before breakfast. 30  tablet 11  . potassium chloride (K-DUR,KLOR-CON) 10 MEQ tablet Take 1 tablet (10 mEq total) by mouth 2 (two) times daily. (Patient taking differently: Take 10 mEq by mouth daily. ) 180 tablet 3  . Vitamin D, Ergocalciferol, (DRISDOL) 50000 UNITS CAPS capsule Take 1 capsule (50,000 Units total) by mouth 2 (two) times a week. 8 capsule 11  . XARELTO 20 MG TABS tablet Take 1 tablet (20 mg total) by mouth daily with supper. 30 tablet 5   No current facility-administered medications for this visit.     Past Medical History  Diagnosis Date  . Atrial fibrillation   . Right hip pain   . Hypertension   . Morbid obesity   . Psoriasis     active breakout left buttocks  . Shortness of breath   . Osteoarthritis   . Avascular necrosis of bone of right hip   . Tachycardia, unspecified   . Personal history of colonic polyps - large hyperplastic 12/25/2013    Past Surgical History  Procedure Laterality Date  . Total hip arthroplasty  07/21/2012    Procedure: TOTAL HIP ARTHROPLASTY;  Surgeon: [name], MD;  Location: [facility];  Service: Orthopedics;  Laterality: Right;  . Fracture surgery  07/21/12    right hip arthroplasty  . Cardioversion N/A 01/09/2013    Procedure: CARDIOVERSION;  Surgeon: [name], MD;  Location: [place];  Service: Cardiovascular;  Laterality: N/A;  . Cardioversion N/A 10/07/2014    Procedure: CARDIOVERSION;  Surgeon: [name], MD;  Location: [place];  Service: Cardiovascular;  Laterality:  N/A;  09:00 Lido 60mg ,IV followed by Propofol  70mg /IV    synched electrocardioversion at 120 joules for Afib,repeated at 200 joules, 70 mg...successfully changed to SR    History   Social History  . Marital Status: Divorced    Spouse Name: [name]  . Number of Children: 1  . Years of Education: 12   Occupational History  . ED [employer]    Social History Main Topics  . Smoking status: Former Smoker    Types:  Cigarettes    Quit date: 09/13/1982  . Smokeless tobacco: Never Used  . Alcohol Use: 0.0 oz/week    0 Standard drinks or equivalent per week     Comment: Occasionally  . Drug Use: No  . Sexual Activity:    Partners: Male   Other Topics Concern  . 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  She used to smoke 4 cigarettes per day for five years and quit 32 years ago.    Alcohol Use:  Occasional   Drug Use:  None   Diet:  Regular   Exercise:  Water Aerobics (2 x per week)     Hobbies: Shopping, Cycling              ROS: no fevers or chills, productive cough, hemoptysis, dysphasia, odynophagia, melena, hematochezia, dysuria, hematuria, rash, seizure activity, orthopnea, PND, pedal edema, claudication. Remaining systems are negative.  Physical Exam: Well-developed obese in no acute distress.  Skin is warm and dry.  HEENT is normal.  Neck is supple.  Chest is clear to auscultation with normal expansion.  Cardiovascular exam is regular rate and rhythm.  Abdominal exam nontender or distended. No masses palpated. Extremities show 1+ edema. neuro grossly intact  ECG sinus rhythm at a rate of 77. No ST changes.

## 2015-01-15 NOTE — Assessment & Plan Note (Signed)
Patient counseled on weight loss and exercise. She has significant snoring. Refer to pulmonary for sleep study and obstructive sleep apnea evaluation.

## 2015-01-15 NOTE — Assessment & Plan Note (Signed)
Continue present dose of Lasix. 

## 2015-01-15 NOTE — Assessment & Plan Note (Signed)
Continue present blood pressure medications. 

## 2015-01-15 NOTE — Patient Instructions (Signed)
Your physician wants you to follow-up in: [place] will receive a reminder letter in the mail two months in advance. If you don't receive a letter, please call our office to schedule the follow-up appointment.   REFERRAL TO PULMONARY FOR SLEEP APNEA  Your physician recommends that you HAVE LAB WORK TODAY

## 2015-01-15 NOTE — Assessment & Plan Note (Signed)
Patient remains in sinus rhythm following recent cardioversion. Continued flecanide and xarelto. Check hemoglobin and renal function.

## 2015-01-16 LAB — BASIC METABOLIC PANEL WITH GFR
BUN: 7 mg/dL (ref 6–23)
CALCIUM: 9.3 mg/dL (ref 8.4–10.5)
CO2: 25 mEq/L (ref 19–32)
Chloride: 102 mEq/L (ref 96–112)
Creat: 0.65 mg/dL (ref 0.50–1.10)
GFR, Est African American: >89 mL/min
GLUCOSE: 104 mg/dL — AB (ref 70–99)
POTASSIUM: 4 meq/L (ref 3.5–5.3)
Sodium: 139 mEq/L (ref 135–145)

## 2015-01-16 LAB — CBC
HCT: 38.5 % (ref 36.0–46.0)
Hemoglobin: 12.9 g/dL (ref 12.0–15.0)
MCH: 29.7 pg (ref 26.0–34.0)
MCHC: 33.5 g/dL (ref 30.0–36.0)
MCV: 88.5 fL (ref 78.0–100.0)
MPV: 10.4 fL (ref 8.6–12.4)
PLATELETS: 326 10*3/uL (ref 150–400)
RBC: 4.35 MIL/uL (ref 3.87–5.11)
RDW: 13.6 % (ref 11.5–15.5)
WBC: 6.1 10*3/uL (ref 4.0–10.5)

## 2015-02-20 ENCOUNTER — Other Ambulatory Visit (INDEPENDENT_AMBULATORY_CARE_PROVIDER_SITE_OTHER): Payer: 59

## 2015-02-20 ENCOUNTER — Encounter: Payer: Self-pay | Admitting: Pulmonary Disease

## 2015-02-20 ENCOUNTER — Ambulatory Visit (INDEPENDENT_AMBULATORY_CARE_PROVIDER_SITE_OTHER): Payer: 59 | Admitting: Pulmonary Disease

## 2015-02-20 VITALS — BP 116/77 | HR 71 | Ht 65.5 in | Wt 286.0 lb

## 2015-02-20 DIAGNOSIS — R635 Abnormal weight gain: Secondary | ICD-10-CM | POA: Diagnosis not present

## 2015-02-20 DIAGNOSIS — G4733 Obstructive sleep apnea (adult) (pediatric): Secondary | ICD-10-CM

## 2015-02-20 NOTE — Progress Notes (Signed)
Subjective:    Patient ID: Monica [name], female    DOB: 1956/07/07, 59 y.o.   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 She has been married for 2 years, and her husband has noted pauses in her breathing in her sleep. She is always snored loudly and has woken herself up by her snoring. She reports restless sleep and bounces up and down during the night. she feels better rested when she sleeps propped up on 3 pillows. She works the first shift but is now moving to the second. Bedtime is around 11 PM, sleep latency varies from 1-1-1/2 hour, reports 2-4 nocturnal awakenings including bathroom visits and is out of bed by 7:30 AM feeling tired with occasional headaches, denies dryness of mouth. She's gained about 40 pounds in the last 2 years. She admits to excessive caffeine intake-and 2 cups of coffee in the morning and 3-4 cans of Pepsi during the day. Epworth sleepiness score is 0-and she denies daytime drowsiness. She has atrial fibrillation, status post DC CV 2, now on flecainide. Echo 09/2014-EF 55%, RVSP 48  There is no history suggestive of cataplexy, sleep paralysis or parasomnias Thyroid testing was normal in 2014   Past Medical History  Diagnosis Date  . Atrial fibrillation   . Right hip pain   . Hypertension   . Morbid obesity   . Psoriasis     active breakout left buttocks  . Shortness of breath   . Osteoarthritis   . Avascular necrosis of bone of right hip   . Tachycardia, unspecified   . Personal history of colonic polyps - large hyperplastic 12/25/2013   Past Surgical History  Procedure Laterality Date  . Total hip arthroplasty  07/21/2012    Procedure: TOTAL HIP ARTHROPLASTY;  Surgeon: [name], MD;  Location: [facility];  Service: Orthopedics;  Laterality: Right;  . Fracture surgery  07/21/12    right hip arthroplasty  . Cardioversion N/A  01/09/2013    Procedure: CARDIOVERSION;  Surgeon: [name], MD;  Location: [facility] ENDOSCOPY;  Service: Cardiovascular;  Laterality: N/A;  . Cardioversion N/A 10/07/2014    Procedure: CARDIOVERSION;  Surgeon: [name], MD;  Location: [facility] ENDOSCOPY;  Service: Cardiovascular;  Laterality: N/A;  09:00 Lido 60mg ,IV followed by Propofol  70mg /IV    synched electrocardioversion at 120 joules for Afib,repeated at 200 joules, 70 mg...successfully changed to SR    No Known Allergies  History   Social History  . Marital Status: Divorced    Spouse Name: [name]  . Number of Children: 1  . Years of Education: 12   Occupational History  . ED [employer] [place]    Social History Main Topics  . Smoking status: Former Smoker    Types: Cigarettes    Quit date: 09/13/1982  . Smokeless tobacco: Never Used  . Alcohol Use: 0.0 oz/week    0 Standard drinks or equivalent per week     Comment: Occasionally  . Drug Use: No  . Sexual Activity:    Partners: Male   Other Topics Concern  . 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  She used to smoke 4 cigarettes per day for five years and quit 32 years ago.    Alcohol Use:  Occasional   Drug Use:  None   Diet:  Regular   Exercise:  Water Aerobics (2 x per week)     Hobbies: Shopping, Cycling              Family History  Problem Relation Age of Onset  . Hypertension Mother   . Diabetes Mother   . Alzheimer's disease Mother   . Thyroid disease Mother   . Diabetes Son      Review of Systems  Constitutional: Negative for fever, chills and unexpected weight change.  HENT: Negative for congestion, dental problem, ear pain, nosebleeds,  postnasal drip, rhinorrhea, sinus pressure, sneezing, sore throat, trouble swallowing and voice change.   Eyes: Negative for visual disturbance.  Respiratory: Negative for cough, choking and shortness of breath.   Cardiovascular: Negative for chest pain and leg swelling.  Gastrointestinal: Negative for vomiting, abdominal pain and diarrhea.  Genitourinary: Negative for difficulty urinating.  Musculoskeletal: Negative for arthralgias.  Skin: Negative for rash.  Neurological: Negative for tremors, syncope and headaches.  Hematological: Does not bruise/bleed easily.       Objective:   Physical Exam  Gen. Pleasant, obese, in no distress, normal affect ENT - no lesions, no post nasal drip, class 2-3 airway Neck: No JVD, no thyromegaly, no carotid bruits Lungs: no use of accessory muscles, no dullness to percussion, decreased without rales or rhonchi  Cardiovascular: Rhythm regular, heart sounds  normal, no murmurs or gallops, no peripheral edema Abdomen: soft and non-tender, no hepatosplenomegaly, BS normal. Musculoskeletal: No deformities, no cyanosis or clubbing Neuro:  alert, non focal, no tremors       Assessment & Plan:

## 2015-02-20 NOTE — Assessment & Plan Note (Signed)
Given excessive daytime somnolence, narrow pharyngeal exam, witnessed apneas & loud snoring, obstructive sleep apnea is very likely & an overnight polysomnogram will be scheduled as a home study. The pathophysiology of obstructive sleep apnea , it's cardiovascular consequences & modes of treatment including CPAP were discused with the patient in detail & they evidenced understanding. Pre test prob is high  Blood work for thyroid

## 2015-02-20 NOTE — Patient Instructions (Signed)
Home sleep study Based on this, you may need CPAP titration study Blood work for thyroid

## 2015-02-21 LAB — T4, FREE: Free T4: 0.73 ng/dL (ref 0.60–1.60)

## 2015-02-21 LAB — TSH: TSH: 0.91 u[IU]/mL (ref 0.35–4.50)

## 2015-02-27 ENCOUNTER — Other Ambulatory Visit: Payer: 59

## 2015-03-12 ENCOUNTER — Ambulatory Visit (INDEPENDENT_AMBULATORY_CARE_PROVIDER_SITE_OTHER): Payer: 59 | Admitting: Urgent Care

## 2015-03-12 VITALS — BP 130/82 | HR 80 | Temp 98.7°F | Resp 16 | Ht 66.0 in | Wt 281.0 lb

## 2015-03-12 DIAGNOSIS — R6889 Other general symptoms and signs: Secondary | ICD-10-CM

## 2015-03-12 DIAGNOSIS — H1010 Acute atopic conjunctivitis, unspecified eye: Secondary | ICD-10-CM

## 2015-03-12 DIAGNOSIS — H1013 Acute atopic conjunctivitis, bilateral: Secondary | ICD-10-CM | POA: Diagnosis not present

## 2015-03-12 DIAGNOSIS — H578 Other specified disorders of eye and adnexa: Secondary | ICD-10-CM

## 2015-03-12 DIAGNOSIS — H579 Unspecified disorder of eye and adnexa: Secondary | ICD-10-CM

## 2015-03-12 HISTORY — DX: Unspecified disorder of eye and adnexa: H57.9

## 2015-03-12 HISTORY — DX: Acute atopic conjunctivitis, unspecified eye: H10.10

## 2015-03-12 HISTORY — DX: Other general symptoms and signs: R68.89

## 2015-03-12 MED ORDER — KETOTIFEN FUMARATE 0.025 % OP SOLN: 1.0000 [drp] | Freq: Two times a day (BID) | OPHTHALMIC | Status: DC

## 2015-03-12 NOTE — Progress Notes (Signed)
    MRN: [id number] DOB: July 19, 1956  Subjective:   Monica [name] is a 59 y.o. female presenting for chief complaint of swelling around right eye and blackness around eye  Reports 2 day history of itchy eyes, draining clear fluid, feels like she has discoloration around her right lower eye lid. Denies fever, eye pain, difficulty with eye movement, matted eyes, sinus pain, sinus congestion, tooth pain, ear pain, ear drainage. Admits history of seasonal allergies, takes Flonase, otc antihistamine. Denies any other aggravating or relieving factors, no other questions or concerns.  Monica [name] has a current medication list which includes the following prescription(s): celebrex, cyclobenzaprine, etanercept, flecainide, multivitamin with minerals, pantoprazole, potassium chloride, vitamin d (ergocalciferol), xarelto, fluticasone, and furosemide. She has No Known Allergies.  Monica [name]  has a past medical history of Atrial fibrillation; Right hip pain; Hypertension; Morbid obesity; Psoriasis; Shortness of breath; Osteoarthritis; Avascular necrosis of bone of right hip; Tachycardia, unspecified; and Personal history of colonic polyps - large hyperplastic (12/25/2013). Also  has past surgical history that includes Total hip arthroplasty (07/21/2012); Fracture surgery (07/21/12); Cardioversion (N/A, 01/09/2013); and Cardioversion (N/A, 10/07/2014).  ROS As in subjective.  Objective:   Vitals: BP 130/82 mmHg  Pulse 80  Temp(Src) 98.7 F (37.1 C) (Oral)  Resp 16  Ht 5\' 6"  (1.676 m)  Wt 281 lb (127.461 kg)  BMI 45.38 kg/m2  SpO2 95%  LMP 06/19/2013  Physical Exam  Constitutional: She is oriented to person, place, and time. She appears well-developed and well-nourished.  Eyes: Lids are normal. Lids are everted and swept, no foreign bodies found. Right eye exhibits no chemosis, no discharge, no exudate and no hordeolum. No foreign body present in the right eye. Left eye exhibits no chemosis, no discharge, no  exudate and no hordeolum. No foreign body present in the left eye. Right conjunctiva is injected. Right conjunctiva has no hemorrhage. Left conjunctiva is not injected. Left conjunctiva has no hemorrhage.  Puffy lower eyelids, R>L  Cardiovascular: Normal rate.   Pulmonary/Chest: Effort normal.  Neurological: She is alert and oriented to person, place, and time.  Skin: Skin is warm and dry. No rash noted. No erythema. No pallor.   PROCEDURE NOTE: eye exam Verbal consent obtained. Eyelids everted and swept for foreign body. The eye was stained with fluorescein. Examination under woods lamp does not reveal a foreign body or area of increased stain uptake. The eye was then irrigated copiously with saline.  Assessment and Plan :   1. Allergic conjunctivitis, bilateral 2. Itchy eyes - Will start Zaditor twice daily, counseled on signs and symptoms of bacterial conjunctivitis, patient is to rtc if these develop. Continue oral antihistamine.  Jaynee Eagles, PA-C [facility] [phone] 03/12/2015 4:59 PM

## 2015-03-12 NOTE — Patient Instructions (Signed)
Allergic Conjunctivitis  The conjunctiva is a thin membrane that covers the visible white part of the eyeball and the underside of the eyelids. This membrane protects and lubricates the eye. The membrane has small blood vessels running through it that can normally be seen. When the conjunctiva becomes inflamed, the condition is called conjunctivitis. In response to the inflammation, the conjunctival blood vessels become swollen. The swelling results in redness in the normally white part of the eye.  The blood vessels of this membrane also react when a person has allergies and is then called allergic conjunctivitis. This condition usually lasts for as long as the allergy persists. Allergic conjunctivitis cannot be passed to another person (non-contagious). The likelihood of bacterial infection is great and the cause is not likely due to allergies if the inflamed eye has:  · A sticky discharge.  · Discharge or sticking together of the lids in the morning.  · Scaling or flaking of the eyelids where the eyelashes come out.  · Red swollen eyelids.  CAUSES   · Viruses.  · Irritants such as foreign bodies.  · Chemicals.  · General allergic reactions.  · Inflammation or serious diseases in the inside or the outside of the eye or the orbit (the boney cavity in which the eye sits) can cause a "red eye."  SYMPTOMS   · Eye redness.  · Tearing.  · Itchy eyes.  · Burning feeling in the eyes.  · Clear drainage from the eye.  · Allergic reaction due to pollens or ragweed sensitivity. Seasonal allergic conjunctivitis is frequent in the spring when pollens are in the air and in the fall.  DIAGNOSIS   This condition, in its many forms, is usually diagnosed based on the history and an ophthalmological exam. It usually involves both eyes. If your eyes react at the same time every year, allergies may be the cause. While most "red eyes" are due to allergy or an infection, the role of an eye (ophthalmological) exam is important. The exam  can rule out serious diseases of the eye or orbit.  TREATMENT   · Non-antibiotic eye drops, ointments, or medications by mouth may be prescribed if the ophthalmologist is sure the conjunctivitis is due to allergies alone.  · Over-the-counter drops and ointments for allergic symptoms should be used only after other causes of conjunctivitis have been ruled out, or as your caregiver suggests.  Medications by mouth are often prescribed if other allergy-related symptoms are present. If the ophthalmologist is sure that the conjunctivitis is due to allergies alone, treatment is normally limited to drops or ointments to reduce itching and burning.  HOME CARE INSTRUCTIONS   · Wash hands before and after applying drops or ointments, or touching the inflamed eye(s) or eyelids.  · Do not let the eye dropper tip or ointment tube touch the eyelid when putting medicine in your eye.  · Stop using your soft contact lenses and throw them away. Use a new pair of lenses when recovery is complete. You should run through sterilizing cycles at least three times before use after complete recovery if the old soft contact lenses are to be used. Hard contact lenses should be stopped. They need to be thoroughly sterilized before use after recovery.  · Itching and burning eyes due to allergies is often relieved by using a cool cloth applied to closed eye(s).  SEEK MEDICAL CARE IF:   · Your problems do not go away after two or three days of treatment.  ·   Your lids are sticky (especially in the morning when you wake up) or stick together.  · Discharge develops. Antibiotics may be needed either as drops, ointment, or by mouth.  · You have extreme light sensitivity.  · An oral temperature above 102° F (38.9° C) develops.  · Pain in or around the eye or any other visual symptom develops.  MAKE SURE YOU:   · Understand these instructions.  · Will watch your condition.  · Will get help right away if you are not doing well or get worse.  Document  Released: 10/23/2002 Document Revised: 10/25/2011 Document Reviewed: 09/18/2007  ExitCare® Patient Information ©2015 [employer]. This information is not intended to replace advice given to you by your health care provider. Make sure you discuss any questions you have with your health care provider.

## 2015-03-17 ENCOUNTER — Telehealth: Payer: Self-pay

## 2015-03-17 MED ORDER — HYDROXYZINE HCL 25 MG PO TABS: 12.5000 mg | ORAL_TABLET | Freq: Three times a day (TID) | ORAL | Status: DC | PRN

## 2015-03-17 NOTE — Telephone Encounter (Signed)
Pt called. Wants to know if we can call her in some Atarax. She is now swollen and very itchy. Prefer's not to come back in. Please advise. Monica [name]

## 2015-03-17 NOTE — Telephone Encounter (Signed)
Meds ordered this encounter  Medications  . hydrOXYzine (ATARAX/VISTARIL) 25 MG tablet    Sig: Take 0.5-1 tablets (12.5-25 mg total) by mouth every 8 (eight) hours as needed for itching.    Dispense:  30 tablet    Refill:  0    Order Specific Question:  Supervising Provider    Answer:  DOOLITTLE, ROBERT P [[id number]]

## 2015-03-21 DIAGNOSIS — G4733 Obstructive sleep apnea (adult) (pediatric): Secondary | ICD-10-CM | POA: Diagnosis not present

## 2015-03-26 ENCOUNTER — Telehealth: Payer: Self-pay | Admitting: Pulmonary Disease

## 2015-03-26 DIAGNOSIS — G4733 Obstructive sleep apnea (adult) (pediatric): Secondary | ICD-10-CM

## 2015-03-26 NOTE — Telephone Encounter (Signed)
Last OV: 02/20/15 AHI 16/hr CPAP Titration Study

## 2015-03-27 DIAGNOSIS — G4733 Obstructive sleep apnea (adult) (pediatric): Secondary | ICD-10-CM | POA: Diagnosis not present

## 2015-03-27 NOTE — Telephone Encounter (Signed)
[phone] calling back again

## 2015-03-27 NOTE — Telephone Encounter (Signed)
Called and spoke to pt. Informed her of the recs per RA. Order placed for CPAP titration. Pt verbalized understanding and denied any further questions or concerns at this time.

## 2015-03-27 NOTE — Telephone Encounter (Signed)
lmtcb

## 2015-03-27 NOTE — Telephone Encounter (Signed)
[phone] calling back

## 2015-03-27 NOTE — Telephone Encounter (Signed)
ATC pt. Unable to leave VM. WCB.  

## 2015-03-28 ENCOUNTER — Other Ambulatory Visit: Payer: Self-pay | Admitting: *Deleted

## 2015-03-28 DIAGNOSIS — G4733 Obstructive sleep apnea (adult) (pediatric): Secondary | ICD-10-CM

## 2015-05-30 ENCOUNTER — Ambulatory Visit (HOSPITAL_BASED_OUTPATIENT_CLINIC_OR_DEPARTMENT_OTHER)
Admission: RE | Admit: 2015-05-30 | Discharge: 2015-05-30 | Disposition: A | Discharge status: Home / Self Care | Payer: 59 | Source: Ambulatory Visit | Attending: Family Medicine | Admitting: Family Medicine

## 2015-05-30 DIAGNOSIS — Z1231 Encounter for screening mammogram for malignant neoplasm of breast: Secondary | ICD-10-CM | POA: Insufficient documentation

## 2015-06-12 ENCOUNTER — Ambulatory Visit (HOSPITAL_BASED_OUTPATIENT_CLINIC_OR_DEPARTMENT_OTHER): Payer: 59 | Attending: Pulmonary Disease

## 2015-06-12 DIAGNOSIS — G4730 Sleep apnea, unspecified: Secondary | ICD-10-CM | POA: Diagnosis present

## 2015-06-12 DIAGNOSIS — G4733 Obstructive sleep apnea (adult) (pediatric): Secondary | ICD-10-CM | POA: Insufficient documentation

## 2015-06-12 DIAGNOSIS — I493 Ventricular premature depolarization: Secondary | ICD-10-CM | POA: Diagnosis not present

## 2015-06-13 ENCOUNTER — Telehealth: Payer: Self-pay | Admitting: Pulmonary Disease

## 2015-06-13 DIAGNOSIS — G4730 Sleep apnea, unspecified: Secondary | ICD-10-CM | POA: Diagnosis not present

## 2015-06-13 NOTE — Telephone Encounter (Signed)
Unable to leave message, mailbox full. wcb 

## 2015-06-13 NOTE — Progress Notes (Signed)
Patient Name: Monica [name], Monica [name] Date: 06/12/2015 Gender: Female D.O.B: Jan 13, 1956 Age (years): 33 Referring Provider: Not Available Height (inches): 65 Interpreting Physician: Kara Mead MD, ABSM Weight (lbs): 280 RPSGT: Baxter Flattery BMI: 39 MRN: [id number] Neck Size: 15.00   CLINICAL INFORMATION The patient is referred for a CPAP titration to treat sleep apnea. Date of  HST: 03/2015 , AHI 16/h   SLEEP STUDY TECHNIQUE As per the AASM Manual for the Scoring of Sleep and Associated Events v2.3 (April 2016) with a hypopnea requiring 4% desaturations. The channels recorded and monitored were frontal, central and occipital EEG, electrooculogram (EOG), submentalis EMG (chin), nasal and oral airflow, thoracic and abdominal wall motion, anterior tibialis EMG, snore microphone, electrocardiogram, and pulse oximetry. Continuous positive airway pressure (CPAP) was initiated at the beginning of the study and titrated to treat sleep-disordered breathing.   MEDICATIONS Medications administered by patient during sleep study : No sleep medicine administered.   TECHNICIAN COMMENTS Comments added by technician: Patient had difficulty initiating sleep.  Comments added by scorer: N/A   RESPIRATORY PARAMETERS Optimal PAP Pressure (cm): 11 AHI at Optimal Pressure (/hr): 0.0 Overall Minimal O2 (%): 83.00 Supine % at Optimal Pressure (%): 0 Minimal O2 at Optimal Pressure (%): 90.0     SLEEP ARCHITECTURE The study was initiated at 10:55:35 PM and ended at 5:08:59 AM. Sleep onset time was 25.9 minutes and the sleep efficiency was 70.6%. The total sleep time was 263.5 minutes. The patient spent 3.79% of the night in stage N1 sleep, 65.09% in stage N2 sleep, 25.99% in stage N3 and 5.12% in REM.Stage REM latency was 260.5 minutes Wake after sleep onset was 84.0. Alpha intrusion was absent. Supine sleep was 82.72%.   CARDIAC DATA The 2 lead EKG demonstrated sinus rhythm. The mean heart rate was  63.89 beats per minute. Other EKG findings include: PVCs.   LEG MOVEMENT DATA The total Periodic Limb Movements of Sleep (PLMS) were 33. The PLMS index was 7.51. A PLMS index of <15 is considered normal in adults.   IMPRESSIONS - The optimal PAP pressure was 11 cm of water. - Central sleep apnea was not noted during this titration (CAI = 0.0/h). - Moderate oxygen desaturations were observed during this titration (min O2 = 83.00%). - No snoring was audible during this study. - 2-lead EKG demonstrated: PVCs - Mild periodic limb movements were observed during this study. Arousals associated with PLMs were rare.   DIAGNOSIS - Obstructive Sleep Apnea (327.23 [G47.33 ICD-10])   RECOMMENDATIONS - Trial of CPAP therapy on 11 cm H2O with a Medium size Fisher&Paykel Full Face Mask Simplus mask and heated humidification. - Avoid alcohol, sedatives and other CNS depressants that may worsen sleep apnea and disrupt normal sleep architecture. - Sleep hygiene should be reviewed to assess factors that may improve sleep quality. - Weight management and regular exercise should be initiated or continued. - Return for re-evaluation after 4 weeks of therapy  Kara Mead MD. FCCP. [facility]

## 2015-06-13 NOTE — Telephone Encounter (Signed)
Please send prescription for- CPAP therapy on 11 cm H2O with a Medium size Fisher&Paykel Full Face Mask Simplus mask and heated humidification.  Download in 4 weeks Office visit with TP/me at [facility] in 6 weeks

## 2015-06-16 NOTE — Telephone Encounter (Signed)
ATC PT NA VM full WCB 

## 2015-06-16 NOTE — Telephone Encounter (Signed)
Patient returned call, may call [phone] or at [phone] after 3:00 pm

## 2015-06-16 NOTE — Telephone Encounter (Signed)
Spoke with pt. She does not want to start CPAP therapy. States that during the sleep study she could not tolerate the CPAP. Wants ROV to discuss options. She has been scheduled with TP in [facility] office on 06/19/15 at 12pm.

## 2015-06-19 ENCOUNTER — Ambulatory Visit (INDEPENDENT_AMBULATORY_CARE_PROVIDER_SITE_OTHER): Payer: 59 | Admitting: Adult Health

## 2015-06-19 ENCOUNTER — Encounter: Payer: Self-pay | Admitting: Adult Health

## 2015-06-19 VITALS — BP 145/72 | HR 77 | Ht 65.0 in | Wt 280.0 lb

## 2015-06-19 DIAGNOSIS — G4733 Obstructive sleep apnea (adult) (pediatric): Secondary | ICD-10-CM | POA: Diagnosis not present

## 2015-06-19 NOTE — Patient Instructions (Addendum)
Please call back if you change your mind regarding CPAP .  Refer to Dr. Ron Parker, [phone] for evaluation for oral appliance .  Work on weight loss.  Follow up Dr. Elsworth Soho  In 4- 6 months and As needed

## 2015-06-22 NOTE — Progress Notes (Signed)
   Subjective:    Patient ID: Monica [name], female    DOB: Sep 29, 1955, 59 y.o.   MRN: [id number]  HPI 59 yo female seen for sleep consult 02/2015 w/ snoring , daytime sleepiness .    TEST  HST 03/21/15 >AHI 16/hr   CPAP titration 06/12/15 >11cm    06/19/15 Follow up: OSA  Pt returns for follow up for sleep apnea.  She was seen in July for sleep consult.  HST showed moderate sleep apnea.  She was set up for a CPAP titration study in Oct w/ good control at 11cm . Pt returns today to discuss results.  We reviewed her sleep study and CPAP titration results.  We reviewed her treatment options with CPAP and wt loss.  She declines CPAP , would like to look into dental appliance.  Explained to pt that moderate sleep apnea may be better controlled with CPAP . She does have hx of atrial fib .  She continued to decline CPAP .  Advised on wt loss.    Review of Systems Constitutional:   No  weight loss, night sweats,  Fevers, chills, + fatigue, or  lassitude.  HEENT:   No headaches,  Difficulty swallowing,  Tooth/dental problems, or  Sore throat,                No sneezing, itching, ear ache, nasal congestion, post nasal drip,   CV:  No chest pain,  Orthopnea, PND, swelling in lower extremities, anasarca, dizziness, palpitations, syncope.   GI  No heartburn, indigestion, abdominal pain, nausea, vomiting, diarrhea, change in bowel habits, loss of appetite, bloody stools.   Resp: No shortness of breath with exertion or at rest.  No excess mucus, no productive cough,  No non-productive cough,  No coughing up of blood.  No change in color of mucus.  No wheezing.  No chest wall deformity  Skin: no rash or lesions.  GU: no dysuria, change in color of urine, no urgency or frequency.  No flank pain, no hematuria   MS:  No joint pain or swelling.  No decreased range of motion.  No back pain.  Psych:  No change in mood or affect. No depression or anxiety.  No memory loss.         Objective:     Physical Exam GEN: A/Ox3; pleasant , NAD, morbidly obese   HEENT:  [place]/AT,  EACs-clear, TMs-wnl, NOSE-clear, THROAT-clear, no lesions, no postnasal drip or exudate noted. Class 2 MP airway   NECK:  Supple w/ fair ROM; no JVD; normal carotid impulses w/o bruits; no thyromegaly or nodules palpated; no lymphadenopathy.  RESP  Clear  P & A; w/o, wheezes/ rales/ or rhonchi.no accessory muscle use, no dullness to percussion  CARD:  RRR, no m/r/g  , no peripheral edema, pulses intact, no cyanosis or clubbing.  GI:   Soft & nt; nml bowel sounds; no organomegaly or masses detected.  Musco: Warm bil, no deformities or joint swelling noted.   Neuro: alert, no focal deficits noted.    Skin: Warm, no lesions or rashes         Assessment & Plan:

## 2015-06-23 ENCOUNTER — Telehealth: Payer: Self-pay | Admitting: Pulmonary Disease

## 2015-06-23 DIAGNOSIS — G4733 Obstructive sleep apnea (adult) (pediatric): Secondary | ICD-10-CM

## 2015-06-23 NOTE — Telephone Encounter (Signed)
lmomtcb x1 for pt 

## 2015-06-24 NOTE — Telephone Encounter (Signed)
Pt returned call. Referral was not placed. Informed pt a referral will be placed for Dr. Ron Parker and our office will call her with an appt. Pt verbalized understanding and denied any further questions or concerns at this time.

## 2015-06-24 NOTE — Telephone Encounter (Signed)
lmtcb for pt.    Patient Instructions     Please call back if you change your mind regarding CPAP .  Refer to Dr. Ron Parker, [phone] for evaluation for oral appliance .  Work on weight loss.  Follow up Dr. Elsworth Soho In 4- 6 months and As needed

## 2015-06-29 NOTE — Assessment & Plan Note (Signed)
Moderate OSA  Discussed wt loss  Declines CPAP  Prefers dental appliance   Plan  Please call back if you change your mind regarding CPAP .  Refer to Dr. Ron Parker, [phone] for evaluation for oral appliance .  Work on weight loss.  Follow up Dr. Elsworth Soho  In 4- 6 months and As needed

## 2015-07-09 ENCOUNTER — Other Ambulatory Visit: Payer: Self-pay | Admitting: Cardiology

## 2015-08-05 ENCOUNTER — Telehealth: Payer: 59 | Admitting: Nurse Practitioner

## 2015-08-05 DIAGNOSIS — R05 Cough: Secondary | ICD-10-CM

## 2015-08-05 DIAGNOSIS — R059 Cough, unspecified: Secondary | ICD-10-CM

## 2015-08-05 MED ORDER — BENZONATATE 100 MG PO CAPS: 100.0000 mg | ORAL_CAPSULE | Freq: Three times a day (TID) | ORAL | Status: DC | PRN

## 2015-08-05 MED ORDER — AZITHROMYCIN 250 MG PO TABS: ORAL_TABLET | ORAL | Status: DC

## 2015-08-05 NOTE — Progress Notes (Signed)
We are sorry that you are not feeling well.  Here is how we plan to help!  Based on what you have shared with me it looks like you have upper respiratory tract inflammation that has resulted in a significant cough.  Inflammation and infection in the upper respiratory tract is commonly called bronchitis and has four common causes:  Allergies, Viral Infections, Acid Reflux and Bacterial Infections.  Allergies, viruses and acid reflux are treated by controlling symptoms or eliminating the cause. An example might be a cough caused by taking certain blood pressure medications. You stop the cough by changing the medication. Another example might be a cough caused by acid reflux. Controlling the reflux helps control the cough.  Based on your presentation I believe you most likely have A cough due to bacteria.  When patients have a fever and a productive cough with a change in color or increased sputum production, we are concerned about bacterial bronchitis.  If left untreated it can progress to pneumonia.  If your symptoms do not improve with your treatment plan it is important that you contact your provider.   I have prescribed Azithromyin 250 mg: two tables now and then one tablet daily for 4 additonal days   In addition you may use A prescription cough medication called Tessalon Perles 100mg . You may take 1-2 capsules every 8 hours as needed for your cough. Cannot do cough syrup with codeine on e visit    HOME CARE . Only take medications as instructed by your medical team. . Complete the entire course of an antibiotic. . Drink plenty of fluids and get plenty of rest. . Avoid close contacts especially the very young and the elderly . Cover your mouth if you cough or cough into your sleeve. . Always remember to wash your hands . A steam or ultrasonic humidifier can help congestion.    GET HELP RIGHT AWAY IF: . You develop worsening fever. . You become short of breath . You cough up blood. . Your  symptoms persist after you have completed your treatment plan MAKE SURE YOU   Understand these instructions.  Will watch your condition.  Will get help right away if you are not doing well or get worse.  Your e-visit answers were reviewed by a board certified advanced clinical practitioner to complete your personal care plan.  Depending on the condition, your plan could have included both over the counter or prescription medications. If there is a problem please reply  once you have received a response from your provider. Your safety is important to [place].  If you have drug allergies check your prescription carefully.    You can use MyChart to ask questions about today's visit, request a non-urgent call back, or ask for a work or school excuse for 24 hours related to this e-Visit. If it has been greater than 24 hours you will need to follow up with your provider, or enter a new e-Visit to address those concerns. You will get an e-mail in the next two days asking about your experience.  I hope that your e-visit has been valuable and will speed your recovery. Thank you for using e-visits.

## 2015-08-26 MED FILL — CELECOXIB 200 MG CAPSULE: 200 | 30 days supply | Qty: 30 | Fill #6

## 2015-08-29 ENCOUNTER — Ambulatory Visit: Payer: 59 | Admitting: Cardiology

## 2015-09-10 ENCOUNTER — Other Ambulatory Visit: Payer: Self-pay | Admitting: Cardiology

## 2015-09-10 MED FILL — XARELTO 20 MG TABLET: 20 | 30 days supply | Qty: 30 | Fill #0

## 2015-09-10 NOTE — Telephone Encounter (Signed)
Rx request sent to pharmacy.  

## 2015-09-19 DIAGNOSIS — I4891 Unspecified atrial fibrillation: Secondary | ICD-10-CM | POA: Diagnosis not present

## 2015-09-19 DIAGNOSIS — I10 Essential (primary) hypertension: Secondary | ICD-10-CM | POA: Diagnosis not present

## 2015-09-19 DIAGNOSIS — M25559 Pain in unspecified hip: Secondary | ICD-10-CM | POA: Diagnosis not present

## 2015-09-19 DIAGNOSIS — E559 Vitamin D deficiency, unspecified: Secondary | ICD-10-CM | POA: Diagnosis not present

## 2015-09-19 DIAGNOSIS — K219 Gastro-esophageal reflux disease without esophagitis: Secondary | ICD-10-CM | POA: Insufficient documentation

## 2015-09-19 DIAGNOSIS — Z13220 Encounter for screening for lipoid disorders: Secondary | ICD-10-CM | POA: Diagnosis not present

## 2015-09-19 DIAGNOSIS — M25473 Effusion, unspecified ankle: Secondary | ICD-10-CM | POA: Diagnosis not present

## 2015-09-19 DIAGNOSIS — B372 Candidiasis of skin and nail: Secondary | ICD-10-CM | POA: Diagnosis not present

## 2015-09-19 MED FILL — BYSTOLIC 10 MG TABLET: 10 | 30 days supply | Qty: 30 | Fill #0

## 2015-09-19 MED FILL — FUROSEMIDE 40 MG TABLET: 40 | 90 days supply | Qty: 90 | Fill #0

## 2015-09-19 MED FILL — PANTOPRAZOLE SOD DR 40 MG T: 40 | 90 days supply | Qty: 90 | Fill #0

## 2015-09-23 MED FILL — POTASSIUM CL 10 MEQ TAB SA: 10 | 30 days supply | Qty: 30 | Fill #0

## 2015-09-23 MED FILL — CELECOXIB 200 MG CAPSULE: 200 | 30 days supply | Qty: 30 | Fill #0

## 2015-09-23 MED FILL — NYSTATIN 100,000 UNIT/GM CR: 100000 | 15 days supply | Qty: 30 | Fill #0

## 2015-09-23 MED FILL — NYSTATIN 100,000 UNIT/GM PO: 100000 | 15 days supply | Qty: 15 | Fill #0

## 2015-09-23 MED FILL — VIT D2 1.25 MG (50,000 UNIT: 1.25 MG | 28 days supply | Qty: 4 | Fill #0

## 2015-09-23 MED FILL — FLUCONAZOLE 200 MG TABLET: 200 | 1 days supply | Qty: 1 | Fill #0

## 2015-09-23 NOTE — Progress Notes (Signed)
HPI: FU atrial fibrillation. Patient underwent cardioversion on 01/09/2013 to sinus rhythm. TSH in May of 2014 1.283. Venous Dopplers November 2015 showed no DVT. Patient developed recurrent atrial fibrillation and had a repeat cardioversion successfully on flecainide. Echocardiogram February 2016 showed normal LV function, mild to moderate mitral regurgitation, severe left atrial enlargement and mild right atrial enlargement. Moderate tricuspid regurgitation with moderately elevated pulmonary pressures. Follow-up exercise treadmill on flecanide did not show significant arrhythmia. Since I last saw her, She has some dyspnea on exertion but no orthopnea or PND. Minimal pedal edema. No chest pain, palpitations or syncope.  Current Outpatient Prescriptions  Medication Sig Dispense Refill  . benzonatate (TESSALON PERLES) 100 MG capsule Take 1 capsule (100 mg total) by mouth 3 (three) times daily as needed for cough. 20 capsule 0  . CELEBREX 200 MG capsule TAKE 1 CAPSULE BY MOUTH 2 TIMES DAILY. (Patient taking differently: TAKE 1 CAPSULE BY MOUTH ONCE DAILY.) 60 capsule 5  . etanercept (ENBREL) 50 MG/ML injection Inject 50 mg into the skin once a week.    . flecainide (TAMBOCOR) 50 MG tablet Take 1 tablet (50 mg total) by mouth 2 (two) times daily. 180 tablet 3  . Multiple Vitamins-Minerals (MULTIVITAMIN WITH MINERALS) tablet Take 1 tablet by mouth daily with supper.    . nebivolol (BYSTOLIC) 5 MG tablet Take 5 mg by mouth daily.    . pantoprazole (PROTONIX) 40 MG tablet Take 1 tablet (40 mg total) by mouth daily before breakfast. 30 tablet 11  . potassium chloride (K-DUR,KLOR-CON) 10 MEQ tablet Take 1 tablet (10 mEq total) by mouth 2 (two) times daily. (Patient taking differently: Take 10 mEq by mouth daily. ) 180 tablet 3  . Vitamin D, Ergocalciferol, (DRISDOL) 50000 UNITS CAPS capsule Take 1 capsule (50,000 Units total) by mouth 2 (two) times a week. 8 capsule 11  . XARELTO 20 MG TABS tablet  Take 1 tablet (20 mg total) by mouth daily with supper. 30 tablet 1  . fluticasone (FLONASE) 50 MCG/ACT nasal spray Place 2 sprays into both nostrils daily. (Patient taking differently: Place 2 sprays into both nostrils as needed. ) 16 g 11  . furosemide (LASIX) 40 MG tablet Take 1 tablet (40 mg total) by mouth 2 (two) times daily. (Patient taking differently: Take 40 mg by mouth daily. ) 180 tablet 3   No current facility-administered medications for this visit.     Past Medical History  Diagnosis Date  . Atrial fibrillation ([place])   . Right hip pain   . Hypertension   . Morbid obesity ([place])   . Psoriasis     active breakout left buttocks  . Shortness of breath   . Osteoarthritis   . Avascular necrosis of bone of right hip ([place])   . Tachycardia, unspecified   . Personal history of colonic polyps - large hyperplastic 12/25/2013    Past Surgical History  Procedure Laterality Date  . Total hip arthroplasty  07/21/2012    Procedure: TOTAL HIP ARTHROPLASTY;  Surgeon: [name], MD;  Location: [facility];  Service: Orthopedics;  Laterality: Right;  . Fracture surgery  07/21/12    right hip arthroplasty  . Cardioversion N/A 01/09/2013    Procedure: CARDIOVERSION;  Surgeon: [name], MD;  Location: [facility];  Service: Cardiovascular;  Laterality: N/A;  . Cardioversion N/A 10/07/2014    Procedure: CARDIOVERSION;  Surgeon: [name], MD;  Location: [facility];  Service: Cardiovascular;  Laterality: N/A;  09:00 Lido 60mg ,IV followed by Propofol  70mg /IV    synched electrocardioversion at 120 joules for Afib,repeated at 200 joules, 70 mg...successfully changed to [place] History  . Marital Status: Divorced    Spouse Name: [name]  . Number of Children: 1  . Years of Education: 12   Occupational History  . ED [employer]    Social History Main Topics  . Smoking status: Former Smoker    Types: Cigarettes     Quit date: 09/13/1982  . Smokeless tobacco: Never Used  . Alcohol Use: 0.0 oz/week    0 Standard drinks or equivalent per week     Comment: Occasionally  . Drug Use: No  . Sexual Activity:    Partners: Male   Other Topics Concern  . 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  She used to smoke 4 cigarettes per day for five years and quit 32 years ago.    Alcohol Use:  Occasional   Drug Use:  None   Diet:  Regular   Exercise:  Water Aerobics (2 x per week)     Hobbies: Shopping, Cycling              Family History  Problem Relation Age of Onset  . Hypertension Mother   . Diabetes Mother   . Alzheimer's disease Mother   . Thyroid disease Mother   . Diabetes Son     ROS: no fevers or chills, productive cough, hemoptysis, dysphasia, odynophagia, melena, hematochezia, dysuria, hematuria, rash, seizure activity, orthopnea, PND, pedal edema, claudication. Remaining systems are negative.  Physical Exam: Well-developed obese in no acute distress.  Skin is warm and dry.  HEENT is normal.  Neck is supple.  Chest is clear to auscultation with normal expansion.  Cardiovascular exam is regular rate and rhythm.  Abdominal exam nontender or distended. No masses palpated. Extremities show trace edema. neuro grossly intact  ECG Sinus rhythm at a rate of 69. Occasional PVC. Nonspecific ST changes.

## 2015-09-29 ENCOUNTER — Encounter: Payer: Self-pay | Admitting: Cardiology

## 2015-09-29 ENCOUNTER — Ambulatory Visit (INDEPENDENT_AMBULATORY_CARE_PROVIDER_SITE_OTHER): Payer: 59 | Admitting: Cardiology

## 2015-09-29 VITALS — BP 158/100 | HR 69 | Ht 65.0 in | Wt 284.0 lb

## 2015-09-29 DIAGNOSIS — I10 Essential (primary) hypertension: Secondary | ICD-10-CM | POA: Diagnosis not present

## 2015-09-29 DIAGNOSIS — I4891 Unspecified atrial fibrillation: Secondary | ICD-10-CM | POA: Diagnosis not present

## 2015-09-29 DIAGNOSIS — I480 Paroxysmal atrial fibrillation: Secondary | ICD-10-CM

## 2015-09-29 LAB — CBC
HCT: 36.3 % (ref 36.0–46.0)
Hemoglobin: 11.9 g/dL — ABNORMAL LOW (ref 12.0–15.0)
MCH: 28.6 pg (ref 26.0–34.0)
MCHC: 32.8 g/dL (ref 30.0–36.0)
MCV: 87.3 fL (ref 78.0–100.0)
MPV: 10 fL (ref 8.6–12.4)
Platelets: 277 10*3/uL (ref 150–400)
RBC: 4.16 MIL/uL (ref 3.87–5.11)
RDW: 14.9 % (ref 11.5–15.5)
WBC: 6.2 10*3/uL (ref 4.0–10.5)

## 2015-09-29 NOTE — Assessment & Plan Note (Signed)
Patient remains in sinus rhythm. Continued flecanide and xarelto. Check hemoglobin and renal function.

## 2015-09-29 NOTE — Patient Instructions (Signed)

## 2015-09-29 NOTE — Assessment & Plan Note (Addendum)
Blood pressure elevated today. However she would like to avoid additional medications if possible. She will follow her blood pressure and if it remains elevated we will add additional medications most likely Norvasc.

## 2015-09-29 NOTE — Assessment & Plan Note (Signed)
Continue present dose of Lasix. Check potassium and renal function. 

## 2015-09-29 NOTE — Assessment & Plan Note (Signed)
Patient counseled on weight loss. 

## 2015-09-30 LAB — BASIC METABOLIC PANEL
BUN: 9 mg/dL (ref 7–25)
CHLORIDE: 104 mmol/L (ref 98–110)
CO2: 29 mmol/L (ref 20–31)
Calcium: 9.3 mg/dL (ref 8.6–10.4)
Creat: 0.81 mg/dL (ref 0.50–1.05)
Glucose, Bld: 130 mg/dL — ABNORMAL HIGH (ref 65–99)
Potassium: 4.1 mmol/L (ref 3.5–5.3)
SODIUM: 140 mmol/L (ref 135–146)

## 2015-10-06 ENCOUNTER — Other Ambulatory Visit: Payer: Self-pay | Admitting: Cardiology

## 2015-10-06 DIAGNOSIS — Z79899 Other long term (current) drug therapy: Secondary | ICD-10-CM | POA: Diagnosis not present

## 2015-10-06 DIAGNOSIS — L400 Psoriasis vulgaris: Secondary | ICD-10-CM | POA: Diagnosis not present

## 2015-10-06 MED FILL — FLECAINIDE ACETATE 50 MG TA: 50 | 90 days supply | Qty: 180 | Fill #0

## 2015-10-06 NOTE — Telephone Encounter (Signed)
Rx request sent to pharmacy.  

## 2015-10-07 MED FILL — METHOTREXATE 2.5 MG TABLET: 2.5 | 14 days supply | Qty: 12 | Fill #0

## 2015-10-10 ENCOUNTER — Ambulatory Visit: Payer: 59 | Admitting: Family Medicine

## 2015-10-13 MED FILL — VIT D2 1.25 MG (50,000 UNIT: 1.25 MG | 28 days supply | Qty: 4 | Fill #1

## 2015-10-13 MED FILL — XARELTO 20 MG TABLET: 20 | 30 days supply | Qty: 30 | Fill #1

## 2015-10-16 ENCOUNTER — Encounter: Payer: Self-pay | Admitting: Family Medicine

## 2015-10-16 ENCOUNTER — Ambulatory Visit (INDEPENDENT_AMBULATORY_CARE_PROVIDER_SITE_OTHER): Payer: 59 | Admitting: Family Medicine

## 2015-10-16 VITALS — BP 136/90 | HR 60 | Ht 66.0 in | Wt 280.0 lb

## 2015-10-16 DIAGNOSIS — M25552 Pain in left hip: Secondary | ICD-10-CM | POA: Diagnosis not present

## 2015-10-16 MED ORDER — METHYLPREDNISOLONE ACETATE 40 MG/ML IJ SUSP
40.0000 mg | Freq: Once | INTRAMUSCULAR | Status: AC
  Administered 2015-10-16: 40 mg via INTRA_ARTICULAR

## 2015-10-16 NOTE — Patient Instructions (Signed)
You have IT band syndrome and trochanteric bursitis Avoid painful activities as much as possible. Ice over area of pain 3-4 times a day for 15 minutes at a time Standing hip rotation and hip side raise exercise 3 sets of 10-15 once a day - add weights if this becomes too easy. Stretches - pick 2 and hold for 20-30 seconds x 3 - do once or twice a day. Tylenol and/or aleve as needed for pain. You were given a cortisone shot today. If not improving, can consider physical therapy. Follow up with me in 1 month or as needed.

## 2015-10-20 DIAGNOSIS — L400 Psoriasis vulgaris: Secondary | ICD-10-CM | POA: Diagnosis not present

## 2015-10-20 DIAGNOSIS — Z79899 Other long term (current) drug therapy: Secondary | ICD-10-CM | POA: Diagnosis not present

## 2015-10-21 DIAGNOSIS — M25552 Pain in left hip: Secondary | ICD-10-CM | POA: Insufficient documentation

## 2015-10-21 MED FILL — METHOTREXATE 2.5 MG TABLET: 2.5 | 30 days supply | Qty: 24 | Fill #0

## 2015-10-21 NOTE — Progress Notes (Signed)
PCP: Ival Bible, MD  Subjective:   HPI: Patient is a 60 y.o. female here for left hip pain.  Patient denies known injury or trauma. She reports 4/10 level of pain lateral left hip. Worse lying on this side, sharp. No numbness or tingling. Using pain medication which helps temporarily. No bowel/bladder dysfunction.  Past Medical History  Diagnosis Date  . Atrial fibrillation ([place])   . Right hip pain   . Hypertension   . Morbid obesity ([place])   . Psoriasis     active breakout left buttocks  . Shortness of breath   . Osteoarthritis   . Avascular necrosis of bone of right hip ([place])   . Tachycardia, unspecified   . Personal history of colonic polyps - large hyperplastic 12/25/2013    Current Outpatient Prescriptions on File Prior to Visit  Medication Sig Dispense Refill  . benzonatate (TESSALON PERLES) 100 MG capsule Take 1 capsule (100 mg total) by mouth 3 (three) times daily as needed for cough. 20 capsule 0  . CELEBREX 200 MG capsule TAKE 1 CAPSULE BY MOUTH 2 TIMES DAILY. (Patient taking differently: TAKE 1 CAPSULE BY MOUTH ONCE DAILY.) 60 capsule 5  . etanercept (ENBREL) 50 MG/ML injection Inject 50 mg into the skin once a week.    . flecainide (TAMBOCOR) 50 MG tablet TAKE 1 TABLET (50 MG TOTAL) BY MOUTH 2 (TWO) TIMES DAILY. 180 tablet 1  . fluticasone (FLONASE) 50 MCG/ACT nasal spray Place 2 sprays into both nostrils daily. (Patient taking differently: Place 2 sprays into both nostrils as needed. ) 16 g 11  . furosemide (LASIX) 40 MG tablet Take 1 tablet (40 mg total) by mouth 2 (two) times daily. (Patient taking differently: Take 40 mg by mouth daily. ) 180 tablet 3  . Multiple Vitamins-Minerals (MULTIVITAMIN WITH MINERALS) tablet Take 1 tablet by mouth daily with supper.    . nebivolol (BYSTOLIC) 5 MG tablet Take 5 mg by mouth daily.    . pantoprazole (PROTONIX) 40 MG tablet Take 1 tablet (40 mg total) by mouth daily before breakfast. 30 tablet 11  . potassium chloride  (K-DUR,KLOR-CON) 10 MEQ tablet Take 1 tablet (10 mEq total) by mouth 2 (two) times daily. (Patient taking differently: Take 10 mEq by mouth daily. ) 180 tablet 3  . Vitamin D, Ergocalciferol, (DRISDOL) 50000 UNITS CAPS capsule Take 1 capsule (50,000 Units total) by mouth 2 (two) times a week. 8 capsule 11  . XARELTO 20 MG TABS tablet Take 1 tablet (20 mg total) by mouth daily with supper. 30 tablet 1   No current facility-administered medications on file prior to visit.    Past Surgical History  Procedure Laterality Date  . Total hip arthroplasty  07/21/2012    Procedure: TOTAL HIP ARTHROPLASTY;  Surgeon: [name], MD;  [facility];  Service: Orthopedics;  Laterality: Right;  . Fracture surgery  07/21/12    right hip arthroplasty  . Cardioversion N/A 01/09/2013    Procedure: CARDIOVERSION;  Surgeon: [name], MD;  Location: [facility];  Service: Cardiovascular;  Laterality: N/A;  . Cardioversion N/A 10/07/2014    Procedure: CARDIOVERSION;  Surgeon: [name], MD;  Location: [facility];  Service: Cardiovascular;  Laterality: N/A;  09:00 Lido 60mg ,IV followed by Propofol  70mg /IV    synched electrocardioversion at 120 joules for Afib,repeated at 200 joules, 70 mg...successfully changed to SR    No Known Allergies  Social History   Social History  . Marital Status: Divorced  Spouse Name: [name]  . Number of Children: 1  . Years of Education: 12   Occupational History  . ED [employer]    Social History Main Topics  . Smoking status: Former Smoker    Types: Cigarettes    Quit date: 09/13/1982  . Smokeless tobacco: Never Used  . Alcohol Use: 0.0 oz/week    0 Standard drinks or equivalent per week     Comment: Occasionally  . Drug Use: No  . Sexual Activity:    Partners: Male   Other Topics Concern  . 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  She used to smoke 4 cigarettes per day for five years and quit 32 years ago.    Alcohol Use:  Occasional   Drug Use:  None   Diet:  Regular   Exercise:  Water Aerobics (2 x per week)     Hobbies: Shopping, Cycling              Family History  Problem Relation Age of Onset  . Hypertension Mother   . Diabetes Mother   . Alzheimer's disease Mother   . Thyroid disease Mother   . Diabetes Son     BP 136/90 mmHg  Pulse 60  Ht 5\' 6"  (1.676 m)  Wt 280 lb (127.007 kg)  BMI 45.21 kg/m2  LMP 06/19/2013  Review of Systems: See HPI above.    Objective:  Physical Exam:  Gen: NAD, comfortable in exam room  Back: No gross deformity, scoliosis. No TTP .  No midline or bony TTP. FROM. Strength LEs 5/5 all muscle groups except 4/5 with left hip abduction.   2+ MSRs in patellar and achilles tendons, equal bilaterally. Negative SLRs. Sensation intact to light touch bilaterally.  Left hip: TTP greater trochanter and proximal IT band. Negative logroll. Negative fabers and piriformis stretches.    Assessment & Plan:  1. Left hip pain - 2/2 IT band syndrome and trochanteric bursitis.  Shown home exercises and stretches to do daily.  Icing, tylenol, nsaids as needed.  Injection given today as well.  F/u in 1 month.  Consider physical therapy if not improving.  After informed written consent patient was lying on right side on exam table.  Area overlying left greater trochanteric bursa prepped with alcohol swab then bursa injected with 6:2 marcaine: depomedrol.  Patient tolerated procedure well without immediate complications.

## 2015-10-21 NOTE — Assessment & Plan Note (Signed)
2/2 IT band syndrome and trochanteric bursitis.  Shown home exercises and stretches to do daily.  Icing, tylenol, nsaids as needed.  Injection given today as well.  F/u in 1 month.  Consider physical therapy if not improving.  After informed written consent patient was lying on right side on exam table.  Area overlying left greater trochanteric bursa prepped with alcohol swab then bursa injected with 6:2 marcaine: depomedrol.  Patient tolerated procedure well without immediate complications.

## 2015-10-22 MED FILL — POTASSIUM CL 10 MEQ TAB SA: 10 | 30 days supply | Qty: 30 | Fill #1

## 2015-10-27 MED FILL — CELECOXIB 200 MG CAPSULE: 200 | 30 days supply | Qty: 30 | Fill #1

## 2015-11-17 ENCOUNTER — Other Ambulatory Visit: Payer: Self-pay | Admitting: Cardiology

## 2015-11-17 MED FILL — BYSTOLIC 10 MG TABLET: 10 | 30 days supply | Qty: 30 | Fill #1

## 2015-11-17 MED FILL — VIT D2 1.25 MG (50,000 UNIT: 1.25 MG | 83 days supply | Qty: 12 | Fill #2

## 2015-11-18 MED FILL — XARELTO 20 MG TABLET: 20 | 30 days supply | Qty: 30 | Fill #0

## 2015-11-18 MED FILL — METHOTREXATE 2.5 MG TABLET: 2.5 | 30 days supply | Qty: 24 | Fill #0

## 2015-11-24 DIAGNOSIS — L400 Psoriasis vulgaris: Secondary | ICD-10-CM | POA: Diagnosis not present

## 2015-11-24 DIAGNOSIS — Z79899 Other long term (current) drug therapy: Secondary | ICD-10-CM | POA: Diagnosis not present

## 2015-11-26 MED FILL — CELECOXIB 200 MG CAPSULE: 200 | 30 days supply | Qty: 30 | Fill #2

## 2015-11-26 MED FILL — POTASSIUM CL 10 MEQ TAB SA: 10 | 30 days supply | Qty: 30 | Fill #2

## 2015-12-16 MED FILL — XARELTO 20 MG TABLET: 20 | 30 days supply | Qty: 30 | Fill #1

## 2015-12-22 MED FILL — METHOTREXATE 2.5 MG TABLET: 2.5 | 28 days supply | Qty: 24 | Fill #0

## 2015-12-23 MED FILL — PANTOPRAZOLE SOD DR 40 MG T: 40 | 90 days supply | Qty: 90 | Fill #1

## 2015-12-23 MED FILL — POTASSIUM CL 10 MEQ TAB SA: 10 | 30 days supply | Qty: 30 | Fill #3

## 2015-12-29 MED FILL — CELECOXIB 200 MG CAPSULE: 200 | 30 days supply | Qty: 30 | Fill #3

## 2016-01-05 MED FILL — FLECAINIDE ACETATE 50 MG TA: 50 | 90 days supply | Qty: 180 | Fill #1

## 2016-01-10 ENCOUNTER — Telehealth: Payer: 59 | Admitting: Nurse Practitioner

## 2016-01-10 ENCOUNTER — Encounter: Payer: Self-pay | Admitting: Emergency Medicine

## 2016-01-10 ENCOUNTER — Telehealth: Payer: Self-pay | Admitting: *Deleted

## 2016-01-10 ENCOUNTER — Emergency Department
Admission: EM | Admit: 2016-01-10 | Discharge: 2016-01-10 | Disposition: A | Discharge status: Home / Self Care | Payer: 59 | Source: Home / Self Care | Attending: Family Medicine | Admitting: Family Medicine

## 2016-01-10 DIAGNOSIS — H9313 Tinnitus, bilateral: Secondary | ICD-10-CM

## 2016-01-10 NOTE — Progress Notes (Signed)
Based on what you shared with me it looks like you have a serious condition that should be evaluated in a face to face office visit.  This is not swimmers ear- do not have ringing in ears with swimmers ear- may need to have ears cleaned out.  If you are having a true medical emergency please call 911.  If you need an urgent face to face visit, [facility] has four urgent care centers for your convenience.  . [facility] a Provider at this Location  [address] [place], [place] [address] . 8 am to 8 pm Monday-Friday . 9 am to 7 pm Saturday-Sunday  . [facility] [place] a Provider at this Location  [place], [place], [place] [address] . 8 am to 8 pm Monday-Friday . 9 am to 6 pm Saturday . 11 am to 6 pm Sunday   . [facility] at [place] Get Driving Directions  [id number] [place], [place] [address] . 8 am to 8 pm Monday-Friday . 9 am to 4 pm Saturday-Sunday   . [facility] (a walk in primary care provider)  [place] a Provider at this Location  [place], [place] [address] . 8 am to 8:30 pm Monday-Thursday . 8 am to 6 pm Friday . 8 am to 4 pm Saturday-Sunday   Your e-visit answers were reviewed by a board certified advanced clinical practitioner to complete your personal care plan.  Thank you for using e-Visits.

## 2016-01-10 NOTE — ED Notes (Signed)
Pt c/o bilateral ear ringing since last night, has had a history of this, usually just one ear, not both.  Applied ear ringing relief last night and was able to sleep.

## 2016-01-10 NOTE — Discharge Instructions (Signed)
Discuss with your doctor discontinuing or changing Lasix.   Tinnitus Tinnitus refers to hearing a sound when there is no actual source for that sound. This is often described as ringing in the ears. However, people with this condition may hear a variety of noises. A person may hear the sound in one ear or in both ears.  The sounds of tinnitus can be soft, loud, or somewhere in between. Tinnitus can last for a few seconds or can be constant for days. It may go away without treatment and come back at various times. When tinnitus is constant or happens often, it can lead to other problems, such as trouble sleeping and trouble concentrating. Almost everyone experiences tinnitus at some point. Tinnitus that is long-lasting (chronic) or comes back often is a problem that may require medical attention.  CAUSES  The cause of tinnitus is often not known. In some cases, it can result from other problems or conditions, including:   Exposure to loud noises from machinery, music, or other sources.  Hearing loss.  Ear or sinus infections.  Earwax buildup.  A foreign object in the ear.  Use of certain medicines.  Use of alcohol and caffeine.  High blood pressure.  Heart diseases.  Anemia.  Allergies.  Meniere disease.  Thyroid problems.  Tumors.  An enlarged part of a weakened blood vessel (aneurysm). SYMPTOMS The main symptom of tinnitus is hearing a sound when there is no source for that sound. It may sound like:   Buzzing.  Roaring.  Ringing.  Blowing air, similar to the sound heard when you listen to a seashell.  Hissing.  Whistling.  Sizzling.  Humming.  Running water.  A sustained musical note. DIAGNOSIS  Tinnitus is diagnosed based on your symptoms. Your health care provider will do a physical exam. A comprehensive hearing exam (audiologic exam) will be done if your tinnitus:   Affects only one ear (unilateral).  Causes hearing difficulties.  Lasts 6 months  or longer. You may also need to see a health care provider who specializes in hearing disorders (audiologist). You may be asked to complete a questionnaire to determine the severity of your tinnitus. Tests may be done to help determine the cause and to rule out other conditions. These can include:  Imaging studies of your head and brain, such as:  A CT scan.  An MRI.  An imaging study of your blood vessels (angiogram). TREATMENT  Treating an underlying medical condition can sometimes make tinnitus go away. If your tinnitus continues, other treatments may include:  Medicines, such as certain antidepressants or sleeping aids.  Sound generators to mask the tinnitus. These include:  Tabletop sound machines that play relaxing sounds to help you fall asleep.  Wearable devices that fit in your ear and play sounds or music.  A small device that uses headphones to deliver a signal embedded in music (acoustic neural stimulation). In time, this may change the pathways of your brain and make you less sensitive to tinnitus. This device is used for very severe cases when no other treatment is working.  Therapy and counseling to help you manage the stress of living with tinnitus.  Using hearing aids or cochlear implants, if your tinnitus is related to hearing loss. HOME CARE INSTRUCTIONS  When possible, avoid being in loud places and being exposed to loud sounds.  Wear hearing protection, such as earplugs, when you are exposed to loud noises.  Do not take stimulants, such as nicotine, alcohol, or caffeine.  Practice techniques for reducing stress, such as meditation, yoga, or deep breathing.  Use a white noise machine, a humidifier, or other devices to mask the sound of tinnitus.  Sleep with your head slightly raised. This may reduce the impact of tinnitus.  Try to get plenty of rest each night. SEEK MEDICAL CARE IF:  You have tinnitus in just one ear.  Your tinnitus continues for 3  weeks or longer without stopping.  Home care measures are not helping.  You have tinnitus after a head injury.  You have tinnitus along with any of the following:  Dizziness.  Loss of balance.  Nausea and vomiting.   This information is not intended to replace advice given to you by your health care provider. Make sure you discuss any questions you have with your health care provider.   Document Released: 08/02/2005 Document Revised: 08/23/2014 Document Reviewed: 01/02/2014 Elsevier Interactive Patient Education [employer].

## 2016-01-10 NOTE — ED Provider Notes (Signed)
CSN: [id number]     Arrival date & time 01/10/16  1336 History   First MD Initiated Contact with Patient 01/10/16 1420     Chief Complaint  Patient presents with  . Tinnitus      HPI Comments: Patient has a history of occasional unilateral tinnitus.  During the past month she has had constant bilateral tinnitus.  No nasal congestion.  No change in hearing.  The history is provided by the patient.    Past Medical History  Diagnosis Date  . Atrial fibrillation ([place])   . Right hip pain   . Hypertension   . Morbid obesity ([place])   . Psoriasis     active breakout left buttocks  . Shortness of breath   . Osteoarthritis   . Avascular necrosis of bone of right hip ([place])   . Tachycardia, unspecified   . Personal history of colonic polyps - large hyperplastic 12/25/2013   Past Surgical History  Procedure Laterality Date  . Total hip arthroplasty  07/21/2012    Procedure: TOTAL HIP ARTHROPLASTY;  Surgeon: [name], MD;  Location: [facility];  Service: Orthopedics;  Laterality: Right;  . Fracture surgery  07/21/12    right hip arthroplasty  . Cardioversion N/A 01/09/2013    Procedure: CARDIOVERSION;  Surgeon: [name], MD;  Location: [facility] ENDOSCOPY;  Service: Cardiovascular;  Laterality: N/A;  . Cardioversion N/A 10/07/2014    Procedure: CARDIOVERSION;  Surgeon: [name], MD;  Location: [facility] ENDOSCOPY;  Service: Cardiovascular;  Laterality: N/A;  09:00 Lido 60mg ,IV followed by Propofol  70mg /IV    synched electrocardioversion at 120 joules for Afib,repeated at 200 joules, 70 mg...successfully changed to SR   Family History  Problem Relation Age of Onset  . Hypertension Mother   . Diabetes Mother   . Alzheimer's disease Mother   . Thyroid disease Mother   . Diabetes Son    Social History  Substance Use Topics  . Smoking status: Former Smoker    Types: Cigarettes    Quit date: 09/13/1982  . Smokeless tobacco: Never Used  . Alcohol Use: 0.0 oz/week    0 Standard drinks  or equivalent per week     Comment: Occasionally   OB History    No data available     Review of Systems No sore throat No cough No pleuritic pain No wheezing No nasal congestion No post-nasal drainage No sinus pain/pressure No itchy/red eyes No earache No hemoptysis No SOB No fever/chills No nausea No vomiting No abdominal pain No diarrhea No urinary symptoms No skin rash No fatigue No myalgias No headache Used OTC meds without relief  Allergies  Review of patient's allergies indicates no known allergies.  Home Medications   Prior to Admission medications   Medication Sig Start Date End Date Taking? Authorizing Provider  benzonatate (TESSALON PERLES) 100 MG capsule Take 1 capsule (100 mg total) by mouth 3 (three) times daily as needed for cough. 08/05/15   Mary-Margaret Hassell Done, FNP  CELEBREX 200 MG capsule TAKE 1 CAPSULE BY MOUTH 2 TIMES DAILY. Patient taking differently: TAKE 1 CAPSULE BY MOUTH ONCE DAILY. 10/09/13   [name], MD  etanercept (ENBREL) 50 MG/ML injection Inject 50 mg into the skin once a week.    Historical Provider, MD  flecainide (TAMBOCOR) 50 MG tablet TAKE 1 TABLET (50 MG TOTAL) BY MOUTH 2 (TWO) TIMES DAILY. 10/06/15   [name], MD  fluticasone (FLONASE) 50 MCG/ACT nasal spray Place 2 sprays into  both nostrils daily. Patient taking differently: Place 2 sprays into both nostrils as needed.  06/20/13 02/20/15  [name], MD  furosemide (LASIX) 40 MG tablet Take 1 tablet (40 mg total) by mouth 2 (two) times daily. Patient taking differently: Take 40 mg by mouth daily.  01/24/14 02/20/15  [name], MD  Multiple Vitamins-Minerals (MULTIVITAMIN WITH MINERALS) tablet Take 1 tablet by mouth daily with supper.    Historical Provider, MD  nebivolol (BYSTOLIC) 5 MG tablet Take 5 mg by mouth daily.    Historical Provider, MD  pantoprazole (PROTONIX) 40 MG tablet Take 1 tablet (40 mg total) by mouth daily before breakfast. 12/25/13   Gatha Mayer, MD  potassium chloride (K-DUR,KLOR-CON) 10 MEQ tablet Take 1 tablet (10 mEq total) by mouth 2 (two) times daily. Patient taking differently: Take 10 mEq by mouth daily.  01/24/14   [name], MD  Vitamin D, Ergocalciferol, (DRISDOL) 50000 UNITS CAPS capsule Take 1 capsule (50,000 Units total) by mouth 2 (two) times a week. 07/05/13   [name], MD  XARELTO 20 MG TABS tablet TAKE 1 TABLET BY MOUTH DAILY WITH SUPPER 11/18/15   [name], MD   Meds Ordered and Administered this Visit  Medications - No data to display  BP 145/84 mmHg  Pulse 64  Temp(Src) 97.8 F (36.6 C) (Oral)  Ht 5' 5.5" (1.664 m)  Wt 284 lb 4 oz (128.935 kg)  BMI 46.57 kg/m2  SpO2 95%  LMP 06/19/2013 No data found.   Physical Exam Nursing notes and Vital Signs reviewed. Appearance:  Patient appears stated age, and in no acute distress.  Patient is obese (BMI 46.6) Eyes:  Pupils are equal, round, and reactive to light and accomodation.  Extraocular movement is intact.  Conjunctivae are not inflamed  Ears:  Canals normal.  Tympanic membranes normal.  Nose:   Normal turbinates.  No sinus tenderness.    Pharynx:  Normal Neck:  Supple.  No adenopathy Lungs:  Clear to auscultation.  Breath sounds are equal.  Moving air well. Heart:  Regular rate and rhythm without murmurs, rubs, or gallops.  Abdomen:  Nontender without masses or hepatosplenomegaly.  Bowel sounds are present.  No CVA or flank tenderness.  Extremities:  No edema.  Skin:  No rash present.   ED Course  Procedures none    Labs Reviewed -   Tympanometry:  Normal both ears.    MDM   1. Tinnitus, bilateral; note normal exam   Consider discontinuing or changing Lasix. Consider the [facility] tinnitus clinic    Kandra Nicolas, MD 01/14/16 [phone]

## 2016-01-13 NOTE — ED Notes (Signed)
Phone note opened to add tympanogram order that had expired

## 2016-01-14 ENCOUNTER — Telehealth: Payer: Self-pay | Admitting: Cardiology

## 2016-01-14 MED FILL — XARELTO 20 MG TABLET: 20 | 30 days supply | Qty: 30 | Fill #2

## 2016-01-14 MED FILL — BYSTOLIC 10 MG TABLET: 10 | 30 days supply | Qty: 30 | Fill #2

## 2016-01-14 NOTE — Telephone Encounter (Signed)
New message      Talk to the nurse about her urgent care visit from this weekend

## 2016-01-14 NOTE — Telephone Encounter (Signed)
LEFT MESSAGE TO CALL BACK

## 2016-01-16 NOTE — Telephone Encounter (Signed)
Left message for pt to call.

## 2016-01-19 MED FILL — FUROSEMIDE 40 MG TABLET: 40 | 90 days supply | Qty: 90 | Fill #1

## 2016-01-23 DIAGNOSIS — E559 Vitamin D deficiency, unspecified: Secondary | ICD-10-CM | POA: Diagnosis not present

## 2016-01-23 DIAGNOSIS — R5383 Other fatigue: Secondary | ICD-10-CM | POA: Diagnosis not present

## 2016-01-23 DIAGNOSIS — E785 Hyperlipidemia, unspecified: Secondary | ICD-10-CM | POA: Diagnosis not present

## 2016-01-23 DIAGNOSIS — H9313 Tinnitus, bilateral: Secondary | ICD-10-CM | POA: Diagnosis not present

## 2016-01-23 DIAGNOSIS — R609 Edema, unspecified: Secondary | ICD-10-CM | POA: Diagnosis not present

## 2016-01-23 DIAGNOSIS — R7301 Impaired fasting glucose: Secondary | ICD-10-CM | POA: Diagnosis not present

## 2016-01-23 MED FILL — predniSONE 10 MG TABS: 10 | 12 days supply | Qty: 48 | Fill #0

## 2016-01-23 MED FILL — TRIAMTERENE/HCTZ 37.5/25 TB: 37.5-25 | 30 days supply | Qty: 30 | Fill #0

## 2016-01-27 MED FILL — POTASSIUM CL 10 MEQ TAB SA: 10 | 30 days supply | Qty: 30 | Fill #4

## 2016-01-27 MED FILL — CELECOXIB 200 MG CAPSULE: 200 | 30 days supply | Qty: 30 | Fill #4

## 2016-01-28 MED FILL — VIT D2 1.25 MG (50,000 UNIT: 1.25 MG | 83 days supply | Qty: 12 | Fill #3

## 2016-01-29 DIAGNOSIS — Z79899 Other long term (current) drug therapy: Secondary | ICD-10-CM | POA: Diagnosis not present

## 2016-01-29 DIAGNOSIS — L400 Psoriasis vulgaris: Secondary | ICD-10-CM | POA: Diagnosis not present

## 2016-02-02 DIAGNOSIS — H9313 Tinnitus, bilateral: Secondary | ICD-10-CM | POA: Diagnosis not present

## 2016-02-02 DIAGNOSIS — H903 Sensorineural hearing loss, bilateral: Secondary | ICD-10-CM | POA: Diagnosis not present

## 2016-02-02 MED FILL — METHOTREXATE 2.5 MG TABLET: 2.5 | 28 days supply | Qty: 16 | Fill #0

## 2016-02-16 MED FILL — XARELTO 20 MG TABLET: 20 | 30 days supply | Qty: 30 | Fill #3

## 2016-02-25 MED FILL — TRIAMTERENE/HCTZ 37.5/25 TB: 37.5-25 | 90 days supply | Qty: 90 | Fill #0

## 2016-02-26 MED FILL — CELECOXIB 200 MG CAPSULE: 200 | 30 days supply | Qty: 30 | Fill #5

## 2016-03-08 MED FILL — POTASSIUM CL 10 MEQ TAB SA: 10 | 30 days supply | Qty: 30 | Fill #5

## 2016-03-08 MED FILL — METHOTREXATE 2.5 MG TABLET: 2.5 | 28 days supply | Qty: 16 | Fill #1

## 2016-03-15 MED FILL — BYSTOLIC 10 MG TABLET: 10 | 30 days supply | Qty: 30 | Fill #3

## 2016-03-15 MED FILL — XARELTO 20 MG TABLET: 20 | 30 days supply | Qty: 30 | Fill #4

## 2016-03-23 MED FILL — PANTOPRAZOLE SOD DR 40 MG T: 40 | 30 days supply | Qty: 30 | Fill #0

## 2016-03-30 MED FILL — METHOTREXATE 2.5 MG TABLET: 2.5 | 28 days supply | Qty: 16 | Fill #2

## 2016-04-06 ENCOUNTER — Other Ambulatory Visit: Payer: Self-pay | Admitting: Cardiology

## 2016-04-06 ENCOUNTER — Telehealth: Payer: Self-pay | Admitting: Cardiology

## 2016-04-06 MED ORDER — FLECAINIDE ACETATE 50 MG PO TABS: ORAL_TABLET | ORAL | 3 refills | Status: DC

## 2016-04-06 MED FILL — FLECAINIDE ACETATE 50 MG TA: 50 | 90 days supply | Qty: 180 | Fill #0

## 2016-04-06 NOTE — Telephone Encounter (Signed)
Refill sent to the pharmacy electronically.  

## 2016-04-06 NOTE — Telephone Encounter (Signed)
New message         *STAT* If patient is at the pharmacy, call can be transferred to refill team.   1. Which medications need to be refilled? (please list name of each medication and dose if known) flecainide 2. Which pharmacy/location (including street and city if local pharmacy) is medication to be sent to? [facility]  3. Do they need a 30 day or 90 day supply?  90 day supply pt is out of medication

## 2016-04-09 DIAGNOSIS — E559 Vitamin D deficiency, unspecified: Secondary | ICD-10-CM | POA: Diagnosis not present

## 2016-04-09 DIAGNOSIS — R5383 Other fatigue: Secondary | ICD-10-CM | POA: Diagnosis not present

## 2016-04-09 DIAGNOSIS — Z1159 Encounter for screening for other viral diseases: Secondary | ICD-10-CM | POA: Diagnosis not present

## 2016-04-09 DIAGNOSIS — E785 Hyperlipidemia, unspecified: Secondary | ICD-10-CM | POA: Diagnosis not present

## 2016-04-09 DIAGNOSIS — R7301 Impaired fasting glucose: Secondary | ICD-10-CM | POA: Diagnosis not present

## 2016-04-13 MED FILL — XARELTO 20 MG TABLET: 20 | 30 days supply | Qty: 30 | Fill #5

## 2016-04-15 DIAGNOSIS — J309 Allergic rhinitis, unspecified: Secondary | ICD-10-CM | POA: Diagnosis not present

## 2016-04-15 DIAGNOSIS — I10 Essential (primary) hypertension: Secondary | ICD-10-CM | POA: Diagnosis not present

## 2016-04-15 DIAGNOSIS — N760 Acute vaginitis: Secondary | ICD-10-CM | POA: Diagnosis not present

## 2016-04-15 DIAGNOSIS — R7301 Impaired fasting glucose: Secondary | ICD-10-CM | POA: Diagnosis not present

## 2016-04-15 DIAGNOSIS — B9689 Other specified bacterial agents as the cause of diseases classified elsewhere: Secondary | ICD-10-CM | POA: Diagnosis not present

## 2016-04-15 DIAGNOSIS — K219 Gastro-esophageal reflux disease without esophagitis: Secondary | ICD-10-CM | POA: Diagnosis not present

## 2016-04-15 DIAGNOSIS — R875 Abnormal microbiological findings in specimens from female genital organs: Secondary | ICD-10-CM | POA: Diagnosis not present

## 2016-04-15 DIAGNOSIS — Z124 Encounter for screening for malignant neoplasm of cervix: Secondary | ICD-10-CM | POA: Diagnosis not present

## 2016-04-15 DIAGNOSIS — Z0000 Encounter for general adult medical examination without abnormal findings: Secondary | ICD-10-CM | POA: Diagnosis not present

## 2016-04-16 MED FILL — raNITIdine HCL 300 MG TABS: 300 | 90 days supply | Qty: 90 | Fill #0

## 2016-04-16 MED FILL — FLUTICASONE PROP 50 MCG SPR: 50 | 90 days supply | Qty: 48 | Fill #0

## 2016-04-16 MED FILL — metroNIDAZOLE 500 MG TABS: 500 | 7 days supply | Qty: 14 | Fill #0

## 2016-04-20 MED FILL — POTASSIUM CL 10 MEQ TAB SA: 10 | 30 days supply | Qty: 30 | Fill #6

## 2016-04-27 MED FILL — VIT D2 1.25 MG (50,000 UNIT: 1.25 MG | 84 days supply | Qty: 12 | Fill #4

## 2016-04-28 MED FILL — METHOTREXATE 2.5 MG TABLET: 2.5 | 28 days supply | Qty: 16 | Fill #0

## 2016-05-03 MED FILL — BYSTOLIC 10 MG TABLET: 10 | 30 days supply | Qty: 30 | Fill #4

## 2016-05-17 ENCOUNTER — Other Ambulatory Visit: Payer: Self-pay | Admitting: Cardiology

## 2016-05-17 MED FILL — XARELTO 20 MG TABLET: 20 | 30 days supply | Qty: 30 | Fill #0

## 2016-05-17 NOTE — Telephone Encounter (Signed)
Rx request sent to pharmacy.  

## 2016-05-20 MED FILL — TERCONAZOLE 0.8% VAGINAL CR: 0.8 | 3 days supply | Qty: 20 | Fill #0

## 2016-05-25 MED FILL — METHOTREXATE 2.5 MG TABLET: 2.5 | 28 days supply | Qty: 16 | Fill #0

## 2016-06-01 MED FILL — POTASSIUM CL 10 MEQ TAB SA: 10 | 30 days supply | Qty: 30 | Fill #7

## 2016-06-01 MED FILL — TRIAMTERENE-HCTZ 37.5-25 MG: 37.5-25 | 90 days supply | Qty: 90 | Fill #1

## 2016-06-09 DIAGNOSIS — Z111 Encounter for screening for respiratory tuberculosis: Secondary | ICD-10-CM | POA: Diagnosis not present

## 2016-06-14 DIAGNOSIS — L400 Psoriasis vulgaris: Secondary | ICD-10-CM | POA: Diagnosis not present

## 2016-06-14 DIAGNOSIS — Z79899 Other long term (current) drug therapy: Secondary | ICD-10-CM | POA: Diagnosis not present

## 2016-06-14 MED FILL — CLOBETASOL 0.05% CREAM: 0.05 | 30 days supply | Qty: 60 | Fill #0

## 2016-06-15 MED FILL — XARELTO 20 MG TABLET: 20 | 30 days supply | Qty: 30 | Fill #1

## 2016-06-23 MED FILL — CLOBETASOL 0.05% SOLUTION: 0.05 | 20 days supply | Qty: 50 | Fill #0

## 2016-06-26 ENCOUNTER — Emergency Department (HOSPITAL_COMMUNITY)
Admission: EM | Admit: 2016-06-26 | Discharge: 2016-06-26 | Disposition: A | Discharge status: Home / Self Care | Payer: 59 | Attending: Emergency Medicine | Admitting: Emergency Medicine

## 2016-06-26 ENCOUNTER — Encounter (HOSPITAL_COMMUNITY): Payer: Self-pay | Admitting: Emergency Medicine

## 2016-06-26 DIAGNOSIS — M25561 Pain in right knee: Secondary | ICD-10-CM | POA: Insufficient documentation

## 2016-06-26 DIAGNOSIS — Z96641 Presence of right artificial hip joint: Secondary | ICD-10-CM | POA: Insufficient documentation

## 2016-06-26 DIAGNOSIS — Z87891 Personal history of nicotine dependence: Secondary | ICD-10-CM | POA: Insufficient documentation

## 2016-06-26 DIAGNOSIS — I10 Essential (primary) hypertension: Secondary | ICD-10-CM | POA: Insufficient documentation

## 2016-06-26 DIAGNOSIS — Z79899 Other long term (current) drug therapy: Secondary | ICD-10-CM | POA: Diagnosis not present

## 2016-06-26 MED ORDER — ACETAMINOPHEN 500 MG PO TABS
1000.0000 mg | ORAL_TABLET | Freq: Once | ORAL | Status: AC
  Administered 2016-06-26: 1000 mg via ORAL
  Filled 2016-06-26: qty 2

## 2016-06-26 NOTE — Discharge Instructions (Signed)
GET HELP IF: Your knee pain does not stop, it changes, or it gets worse. You have a fever along with knee pain. Your knee gives out or locks up. Your knee becomes more swollen. GET HELP RIGHT AWAY IF:  Your knee feels hot to the touch. You have chest pain or trouble breathing.

## 2016-06-26 NOTE — ED Provider Notes (Signed)
[facility] DEPT Provider Note   CSN: [id number] Arrival date & time: 06/26/16  0915   By signing my name below, I, Neta Mends, attest that this documentation has been prepared under the direction and in the presence of Heath Lark, [place]. Electronically Signed: Neta Mends, ED Scribe. 06/26/2016. 10:35 AM.   History   Chief Complaint Chief Complaint  Patient presents with  . Knee Pain    The history is provided by the patient. No language interpreter was used.   HPI Comments:  Monica [name] is a 60 y.o. female with PMHx of Afib, arthritis, and HTN who presents to the Emergency Department complaining of sudden onset, constant right knee pain that began yesterday. Pt reports that she was sitting down at home, and noticed the pain when she stood up. Pt complains of associated swelling to the right knee. Pt describes her pain as "aching," and rates her current pain at 9/10. Pt notes that the pain is exacerbated when standing, and alleviated slightly when laying down. Pt reports PSHx of right total hip arthroplasty and 2 cardioversions. Pt notes that she takes an anticoagulate. Pt took half of a Vicodin at 0400 today with mild relief. No alleviating factors noted. Pt denies injury/trauma. Pt denies fever, SOB, chest pain, palpitations, nausea, vomiting.   Past Medical History:  Diagnosis Date  . Atrial fibrillation ([place])   . Avascular necrosis of bone of right hip ([place])   . Hypertension   . Morbid obesity ([place])   . Osteoarthritis   . Personal history of colonic polyps - large hyperplastic 12/25/2013  . Psoriasis    active breakout left buttocks  . Right hip pain   . Shortness of breath   . Tachycardia, unspecified     Patient Active Problem List   Diagnosis Date Noted  . Left hip pain 10/21/2015  . Itchy eyes 03/12/2015  . Allergic conjunctivitis 03/12/2015  . OSA (obstructive sleep apnea) 02/20/2015  . Morbid obesity ([place]) 01/15/2015  . Lymphedema  06/19/2014  . Personal history of colonic polyps - large hyperplastic 12/25/2013  . Skin infection 10/14/2013  . Candidiasis of female genitalia 10/14/2013  . IFG (impaired fasting glucose) 10/14/2013  . Carpal tunnel syndrome 10/14/2013  . Cough 06/20/2013  . Allergic rhinitis, cause unspecified 06/20/2013  . Backache 06/05/2013  . Edema 12/20/2012  . Swelling of limb 11/30/2012  . Essential hypertension, benign 11/30/2012  . Headache(784.0) 11/30/2012  . Avascular necrosis of hip ([place]) 07/21/2012  . Atrial fibrillation ([place]) 07/06/2012  . Hypertension 09/14/2011  . Psoriasis 08/16/2005    Past Surgical History:  Procedure Laterality Date  . CARDIOVERSION N/A 01/09/2013   Procedure: CARDIOVERSION;  Surgeon: [name], MD;  Location: [facility] ENDOSCOPY;  Service: Cardiovascular;  Laterality: N/A;  . CARDIOVERSION N/A 10/07/2014   Procedure: CARDIOVERSION;  Surgeon: [name], MD;  Location: [facility] ENDOSCOPY;  Service: Cardiovascular;  Laterality: N/A;  09:00 Lido 60mg ,IV followed by Propofol  70mg /IV    synched electrocardioversion at 120 joules for Afib,repeated at 200 joules, 70 mg...successfully changed to SR  . FRACTURE SURGERY  07/21/12   right hip arthroplasty  . TOTAL HIP ARTHROPLASTY  07/21/2012   Procedure: TOTAL HIP ARTHROPLASTY;  Surgeon: [name], MD;  Location: [facility] ORS;  Service: Orthopedics;  Laterality: Right;    OB History    No data available       Home Medications    Prior to Admission medications   Medication Sig Start Date End Date  Taking? Authorizing Provider  benzonatate (TESSALON PERLES) 100 MG capsule Take 1 capsule (100 mg total) by mouth 3 (three) times daily as needed for cough. 08/05/15   Mary-Margaret Hassell Done, FNP  CELEBREX 200 MG capsule TAKE 1 CAPSULE BY MOUTH 2 TIMES DAILY. Patient taking differently: TAKE 1 CAPSULE BY MOUTH ONCE DAILY. 10/09/13   [name], MD  etanercept (ENBREL) 50 MG/ML injection Inject 50 mg into the skin once  a week.    Historical Provider, MD  flecainide (TAMBOCOR) 50 MG tablet TAKE 1 TABLET (50 MG TOTAL) BY MOUTH 2 (TWO) TIMES DAILY. 04/06/16   [name], MD  fluticasone (FLONASE) 50 MCG/ACT nasal spray Place 2 sprays into both nostrils daily. Patient taking differently: Place 2 sprays into both nostrils as needed.  06/20/13 02/20/15  [name], MD  furosemide (LASIX) 40 MG tablet Take 1 tablet (40 mg total) by mouth 2 (two) times daily. Patient taking differently: Take 40 mg by mouth daily.  01/24/14 02/20/15  [name], MD  Multiple Vitamins-Minerals (MULTIVITAMIN WITH MINERALS) tablet Take 1 tablet by mouth daily with supper.    Historical Provider, MD  nebivolol (BYSTOLIC) 5 MG tablet Take 5 mg by mouth daily.    Historical Provider, MD  pantoprazole (PROTONIX) 40 MG tablet Take 1 tablet (40 mg total) by mouth daily before breakfast. 12/25/13   Gatha Mayer, MD  potassium chloride (K-DUR,KLOR-CON) 10 MEQ tablet Take 1 tablet (10 mEq total) by mouth 2 (two) times daily. Patient taking differently: Take 10 mEq by mouth daily.  01/24/14   [name], MD  Vitamin D, Ergocalciferol, (DRISDOL) 50000 UNITS CAPS capsule Take 1 capsule (50,000 Units total) by mouth 2 (two) times a week. 07/05/13   [name], MD  XARELTO 20 MG TABS tablet Take 1 tablet (20 mg total) by mouth daily with supper. Please schedule appointment for refills. 05/17/16   [name], MD    Family History Family History  Problem Relation Age of Onset  . Hypertension Mother   . Diabetes Mother   . Alzheimer's disease Mother   . Thyroid disease Mother   . Diabetes Son     Social History Social History  Substance Use Topics  . Smoking status: Former Smoker    Types: Cigarettes    Quit date: 09/13/1982  . Smokeless tobacco: Never Used  . Alcohol use 0.0 oz/week     Comment: Occasionally     Allergies   Patient has no known allergies.   Review of Systems Review of Systems  Constitutional:  Negative for fever.  Respiratory: Negative for shortness of breath.   Cardiovascular: Negative for chest pain and palpitations.  Gastrointestinal: Negative for nausea and vomiting.  Musculoskeletal: Positive for arthralgias and joint swelling.     Physical Exam Updated Vital Signs BP 124/68 (BP Location: Right Arm)   Pulse 64   Temp 97.7 F (36.5 C) (Oral)   Resp 20   Ht 5' 5.5" (1.664 m)   Wt 127 kg   LMP 06/19/2013   SpO2 98%   BMI 45.89 kg/m   Physical Exam  Physical Exam  Constitutional: Pt appears well-developed and well-nourished. No distress.  HENT:  Head: Normocephalic and atraumatic.  Eyes: Conjunctivae are normal.  Neck: Normal range of motion.  Cardiovascular: Normal rate, regular rhythm and intact distal pulses.   Capillary refill < 3 sec  Pulmonary/Chest: Effort normal and breath sounds normal.  Musculoskeletal: Pt exhibits tenderness. Pt exhibits no  edema.  ROM: limited ROM  due to pain on medial aspect of right knee Neurological: Pt  is alert. Coordination normal.  Sensation Aching worse with standing Strength 5/5  Skin: Skin is warm and dry. Pt is not diaphoretic.  No tenting of the skin  Psychiatric: Pt has a normal mood and affect.  Nursing note and vitals reviewed.    ED Treatments / Results  DIAGNOSTIC STUDIES:  Oxygen Saturation is 100% on RA, normal by my interpretation.    COORDINATION OF CARE:  10:35 AM Discussed treatment plan with pt at bedside and pt agreed to plan.   Labs (all labs ordered are listed, but only abnormal results are displayed) Labs Reviewed - No data to display  EKG  EKG Interpretation None       Radiology No results found.  Procedures Procedures (including critical care time)  Medications Ordered in ED Medications  acetaminophen (TYLENOL) tablet 1,000 mg (1,000 mg Oral Given 06/26/16 1136)     Initial Impression / Assessment and Plan / ED Course  I have reviewed the triage vital signs and the  nursing notes.  Pertinent labs & imaging results that were available during my care of the patient were reviewed by me and considered in my medical decision making (see chart for details).  Clinical Course   Patient is a 60 y/o with history of afib, arthritis, and hypertension presenting with2 day atraumatic severe right anterior medial knee pain referred by her cardiologist over the phone to be seen for possible DVT. Patient was not tachycardic, tachypneic, had no calf swelling or redness, negative homen's sign, no fever, chills, no chest pain, and no SOB. I think patient most likely is presenting with flare of osteoarthritis with possible injury to right medial meniscus. Consulted with Dr. Alvino Chapel and he agreed with assessment. Patient on Xarelto so pain will managed with 1g of Tylenol. Consulted with patient and her hersband about findings and her low risk of having a DVT and that the doppler would not be necessary, however still giving the option to do the study if she insisted. Patient agreed with assessment with opting to not do the doppler study. Discharge instructions and return precautions were given to the patient. Patient was also referred to her orthopedic physician for further evaluation and follow up.  Patient requested and given knee sleeve as well as instructions to use her stockings to lower risk of DVT.    Final Clinical Impressions(s) / ED Diagnoses   Final diagnoses:  Right knee pain, unspecified chronicity    New Prescriptions Discharge Medication List as of 06/26/2016 11:22 AM    I personally performed the services described in this documentation, which was scribed in my presence. The recorded information has been reviewed and is accurate.    [place], [place] 06/26/16 [place], MD 06/27/16 [phone]

## 2016-06-26 NOTE — ED Triage Notes (Signed)
Pt c/o right knee pain onset yesterday. Pt denies injury. Pt tried 1/2 of a Vicodin at 0400 today with some relief.

## 2016-06-30 MED FILL — METHOTREXATE 2.5 MG TABLET: 2.5 | 28 days supply | Qty: 16 | Fill #0

## 2016-07-01 ENCOUNTER — Encounter: Payer: Self-pay | Admitting: Family Medicine

## 2016-07-01 ENCOUNTER — Ambulatory Visit (INDEPENDENT_AMBULATORY_CARE_PROVIDER_SITE_OTHER): Payer: 59 | Admitting: Family Medicine

## 2016-07-01 VITALS — BP 117/78 | HR 75 | Ht 66.0 in

## 2016-07-01 DIAGNOSIS — M25561 Pain in right knee: Secondary | ICD-10-CM | POA: Diagnosis not present

## 2016-07-01 MED ORDER — METHYLPREDNISOLONE ACETATE 40 MG/ML IJ SUSP
40.0000 mg | Freq: Once | INTRAMUSCULAR | Status: AC
  Administered 2016-07-01: 40 mg via INTRA_ARTICULAR

## 2016-07-01 MED ORDER — DICLOFENAC SODIUM 1 % TD GEL: 4.0000 g | Freq: Four times a day (QID) | TRANSDERMAL | 1 refills | Status: DC

## 2016-07-01 MED FILL — DICLOFENAC SODIUM 1% GEL: 1 | 32 days supply | Qty: 500 | Fill #0

## 2016-07-01 NOTE — Patient Instructions (Signed)
Your knee pain is due to arthritis vs a degenerative medial meniscus tear. These are the different classes of medicine you can take for this: Take tylenol 500mg  1-2 tabs three times a day for pain. Voltaren gel up to 4 times a day. Glucosamine sulfate 750mg  twice a day is a supplement that may help. Capsaicin, aspercreme, or biofreeze topically up to four times a day may also help with pain. Cortisone injections are an option - you were given this today. If cortisone injections do not help, there are different types of shots that may help but they take longer to take effect. It's important that you continue to stay active. Straight leg raises, knee extensions 3 sets of 10 once a day (add ankle weight if these become too easy). Consider physical therapy to strengthen muscles around the joint that hurts to take pressure off of the joint itself. Shoe inserts with good arch support may be helpful. Walker or cane if needed. Heat or ice 15 minutes at a time 3-4 times a day as needed to help with pain. Water aerobics and cycling with low resistance are the best two types of exercise for arthritis. Follow up with me in 1 month.

## 2016-07-05 MED FILL — FLECAINIDE ACETATE 50 MG TA: 50 | 90 days supply | Qty: 180 | Fill #1

## 2016-07-06 DIAGNOSIS — M25561 Pain in right knee: Secondary | ICD-10-CM

## 2016-07-06 HISTORY — DX: Pain in right knee: M25.561

## 2016-07-06 NOTE — Assessment & Plan Note (Signed)
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  Injection given today.  Discussed tylenol, voltaren gel, glucosamine, topical medications.  Shown home exercises to do daily.  Heat/ice.  Cane if needed.  F/u in 1 month.  After informed written consent, patient was seated on exam table. Right knee was prepped with alcohol swab and utilizing anteromedial approach, patient's right knee was injected intraarticularly with 3:1 marcaine: depomedrol. Patient tolerated the procedure well without immediate complications.

## 2016-07-06 NOTE — Progress Notes (Signed)
PCP: Ival Bible, MD  Subjective:   HPI: Patient is a 60 y.o. female here for right knee pain.  Patient reports she started to get pain during the day in right knee on 11/10. No acute injury or trauma. States pain is achy, 5/10 level, anteromedial knee. Using voltaren gel with some improvement. Worse with walking. No skin changes, numbness.  Past Medical History:  Diagnosis Date  . Atrial fibrillation ([place])   . Avascular necrosis of bone of right hip ([place])   . Hypertension   . Morbid obesity ([place])   . Osteoarthritis   . Personal history of colonic polyps - large hyperplastic 12/25/2013  . Psoriasis    active breakout left buttocks  . Right hip pain   . Shortness of breath   . Tachycardia, unspecified     Current Outpatient Prescriptions on File Prior to Visit  Medication Sig Dispense Refill  . CELEBREX 200 MG capsule TAKE 1 CAPSULE BY MOUTH 2 TIMES DAILY. (Patient taking differently: TAKE 1 CAPSULE BY MOUTH ONCE DAILY.) 60 capsule 5  . etanercept (ENBREL) 50 MG/ML injection Inject 50 mg into the skin once a week.    . flecainide (TAMBOCOR) 50 MG tablet TAKE 1 TABLET (50 MG TOTAL) BY MOUTH 2 (TWO) TIMES DAILY. 180 tablet 3  . fluticasone (FLONASE) 50 MCG/ACT nasal spray Place 2 sprays into both nostrils daily. (Patient taking differently: Place 2 sprays into both nostrils as needed. ) 16 g 11  . furosemide (LASIX) 40 MG tablet Take 1 tablet (40 mg total) by mouth 2 (two) times daily. (Patient taking differently: Take 40 mg by mouth daily. ) 180 tablet 3  . Multiple Vitamins-Minerals (MULTIVITAMIN WITH MINERALS) tablet Take 1 tablet by mouth daily with supper.    . nebivolol (BYSTOLIC) 5 MG tablet Take 5 mg by mouth daily.    . pantoprazole (PROTONIX) 40 MG tablet Take 1 tablet (40 mg total) by mouth daily before breakfast. 30 tablet 11  . potassium chloride (K-DUR,KLOR-CON) 10 MEQ tablet Take 1 tablet (10 mEq total) by mouth 2 (two) times daily. (Patient taking differently:  Take 10 mEq by mouth daily. ) 180 tablet 3  . Vitamin D, Ergocalciferol, (DRISDOL) 50000 UNITS CAPS capsule Take 1 capsule (50,000 Units total) by mouth 2 (two) times a week. 8 capsule 11  . XARELTO 20 MG TABS tablet Take 1 tablet (20 mg total) by mouth daily with supper. Please schedule appointment for refills. 30 tablet 1   No current facility-administered medications on file prior to visit.     Past Surgical History:  Procedure Laterality Date  . CARDIOVERSION N/A 01/09/2013   Procedure: CARDIOVERSION;  Surgeon: [name], MD;  [facility] ENDOSCOPY;  Service: Cardiovascular;  Laterality: N/A;  . CARDIOVERSION N/A 10/07/2014   Procedure: CARDIOVERSION;  Surgeon: [name], MD;  Location: [facility] ENDOSCOPY;  Service: Cardiovascular;  Laterality: N/A;  09:00 Lido 60mg ,IV followed by Propofol  70mg /IV    synched electrocardioversion at 120 joules for Afib,repeated at 200 joules, 70 mg...successfully changed to SR  . FRACTURE SURGERY  07/21/12   right hip arthroplasty  . TOTAL HIP ARTHROPLASTY  07/21/2012   Procedure: TOTAL HIP ARTHROPLASTY;  Surgeon: [name], MD;  Location: [facility] ORS;  Service: Orthopedics;  Laterality: Right;    No Known Allergies  Social History   Social History  . Marital status: Divorced    Spouse name: [name]  . Number of children: 1  . Years of education: 47  Occupational History  . [employer]    Social History Main Topics  . Smoking status: Former Smoker    Types: Cigarettes    Quit date: 09/13/1982  . Smokeless tobacco: Never Used  . Alcohol use 0.0 oz/week     Comment: Occasionally  . Drug use: No  . Sexual activity: Yes    Partners: Male   Other Topics Concern  . Not on file   Social History Narrative   Marital Status: Married ([name])    Children:  Son [name])    Pets: None   Living Situation: Lives with [name]   Occupation: Admission Services Associate - [employer]     EducationField seismologist    Tobacco Use/Exposure: Former Smoker.  She used to smoke 4 cigarettes per day for five years and quit 32 years ago.    Alcohol Use:  Occasional   Drug Use:  None   Diet:  Regular   Exercise:  Water Aerobics (2 x per week)     Hobbies: Shopping, Cycling              Family History  Problem Relation Age of Onset  . Hypertension Mother   . Diabetes Mother   . Alzheimer's disease Mother   . Thyroid disease Mother   . Diabetes Son     BP 117/78   Pulse 75   Ht 5\' 6"  (1.676 m)   LMP 06/19/2013   Review of Systems: See HPI above.     Objective:  Physical Exam:  Gen: NAD, comfortable in exam room  Right knee: No gross deformity, ecchymoses, effusion. TTP medial joint line.  No other tenderness. FROM. Negative ant/post drawers. Negative valgus/varus testing. Negative lachmanns. Negative mcmurrays, apleys, patellar apprehension. NV intact distally.  Left knee: FROM without pain.   Assessment & Plan:  1. 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  Injection given today.  Discussed tylenol, voltaren gel, glucosamine, topical medications.  Shown home exercises to do daily.  Heat/ice.  Cane if needed.  F/u in 1 month.  After informed written consent, patient was seated on exam table. Right knee was prepped with alcohol swab and utilizing anteromedial approach, patient's right knee was injected intraarticularly with 3:1 marcaine: depomedrol. Patient tolerated the procedure well without immediate complications.

## 2016-07-12 MED FILL — BYSTOLIC 10 MG TABLET: 10 | 30 days supply | Qty: 30 | Fill #5

## 2016-07-13 ENCOUNTER — Other Ambulatory Visit: Payer: Self-pay | Admitting: Cardiology

## 2016-07-14 MED FILL — XARELTO 20 MG TABLET: 20 | 30 days supply | Qty: 30 | Fill #0

## 2016-07-20 MED FILL — POTASSIUM CL 10 MEQ TAB SA: 10 | 30 days supply | Qty: 30 | Fill #8

## 2016-07-20 MED FILL — VIT D2 1.25 MG (50,000 UNIT: 1.25 MG | 28 days supply | Qty: 4 | Fill #5

## 2016-07-26 ENCOUNTER — Other Ambulatory Visit (HOSPITAL_BASED_OUTPATIENT_CLINIC_OR_DEPARTMENT_OTHER): Payer: Self-pay | Admitting: Family Medicine

## 2016-07-26 DIAGNOSIS — Z1231 Encounter for screening mammogram for malignant neoplasm of breast: Secondary | ICD-10-CM

## 2016-07-27 ENCOUNTER — Ambulatory Visit (HOSPITAL_BASED_OUTPATIENT_CLINIC_OR_DEPARTMENT_OTHER)
Admission: RE | Admit: 2016-07-27 | Discharge: 2016-07-27 | Disposition: A | Discharge status: Home / Self Care | Payer: 59 | Source: Ambulatory Visit | Attending: Family Medicine | Admitting: Family Medicine

## 2016-07-27 DIAGNOSIS — Z1231 Encounter for screening mammogram for malignant neoplasm of breast: Secondary | ICD-10-CM | POA: Insufficient documentation

## 2016-08-03 MED FILL — METHOTREXATE 2.5 MG TABLET: 2.5 | 28 days supply | Qty: 16 | Fill #0

## 2016-08-10 ENCOUNTER — Other Ambulatory Visit: Payer: Self-pay | Admitting: Cardiology

## 2016-08-10 MED FILL — XARELTO 20 MG TABLET: 20 | 30 days supply | Qty: 30 | Fill #0

## 2016-08-26 ENCOUNTER — Encounter (INDEPENDENT_AMBULATORY_CARE_PROVIDER_SITE_OTHER): Payer: 59 | Admitting: Family Medicine

## 2016-08-31 MED FILL — TRIAMTERENE-HCTZ 37.5-25 MG: 37.5-25 | 90 days supply | Qty: 90 | Fill #2

## 2016-09-03 MED FILL — BYSTOLIC 10 MG TABLET: 10 | 30 days supply | Qty: 30 | Fill #0

## 2016-09-09 DIAGNOSIS — E876 Hypokalemia: Secondary | ICD-10-CM | POA: Diagnosis not present

## 2016-09-09 DIAGNOSIS — I10 Essential (primary) hypertension: Secondary | ICD-10-CM | POA: Diagnosis not present

## 2016-09-09 DIAGNOSIS — E559 Vitamin D deficiency, unspecified: Secondary | ICD-10-CM | POA: Diagnosis not present

## 2016-09-09 MED FILL — POTASSIUM CL 10 MEQ TAB SA: 10 | 90 days supply | Qty: 90 | Fill #0

## 2016-09-09 MED FILL — VIT D2 1.25 MG (50,000 UNIT: 1.25 MG | 90 days supply | Qty: 12 | Fill #0

## 2016-09-16 ENCOUNTER — Other Ambulatory Visit: Payer: Self-pay | Admitting: Pharmacist Clinician (PhC)/ Clinical Pharmacy Specialist

## 2016-09-16 MED ORDER — XARELTO 20 MG PO TABS: ORAL_TABLET | ORAL | 0 refills | Status: DC

## 2016-09-16 MED FILL — XARELTO 20 MG TABLET: 20 | 30 days supply | Qty: 30 | Fill #0

## 2016-10-04 MED FILL — FLECAINIDE ACETATE 50 MG TA: 50 | 90 days supply | Qty: 180 | Fill #2

## 2016-11-03 ENCOUNTER — Other Ambulatory Visit: Payer: Self-pay | Admitting: Cardiology

## 2016-11-03 MED FILL — BYSTOLIC 10 MG TABLET: 10 | 30 days supply | Qty: 30 | Fill #0

## 2016-11-04 MED FILL — XARELTO 20 MG TABLET: 20 | 30 days supply | Qty: 30 | Fill #0

## 2016-11-04 NOTE — Telephone Encounter (Signed)
Last OV with Dr Stanford Breed 09/29/15  Last blood work 09/2015  Okay 30 day supply today without refills. Need blood work before next refill.

## 2016-11-18 NOTE — Progress Notes (Signed)
Cardiology Office Note    Date:  11/19/2016   ID:  Monica [name], DOB Dec 18, 1955, MRN [id number]  PCP:  Monica Bible, MD  Cardiologist: Dr. Stanford [name]   Chief Complaint  Patient presents with  . Follow-up    wants to discuss meds, refill on Xarelto    History of Present Illness:    Monica [name] is a 61 y.o. female with past medical history of paroxysmal atrial fibrillation (s/p DCCV in 12/2012, repeat DCCV in 09/2014 - on Flecainide), HTN, and morbid obesity who presents to the office today for follow-up.   She was last seen by Dr. Stanford [name] in 09/2015 and denied any recent chest discomfort or palpitations. Reported baseline dyspnea on exertion. She was continued on Flecainide and Xarelto at that time.   In talking with the patient today, she denies any recent chest discomfort, palpitations, or dyspnea on exertion.  No orthopnea, PND, lower extremity edema, lightheadedness, dizziness, or presyncope.   She has experienced new-onset tinnitus within the past month and has adjusted some of her medications due to thinking this was the cause of her symptoms. Lasix was stopped by her PCP and switched to Maxzide, but her symptoms continued. She has since stopped Maxzide and started an "OTC water pill". She asks about taking a "drug holiday" to see if this will help with her symptoms. This was strongly discouraged, as she is on both Xarelto and Flecainide for her atrial fibrillation and the chance of having a stroke or going back into atrial fibrillation was reviewed with the patient. She has been on both of these medications for over 2 years and with her symptoms just starting 1 month ago, these are an unlikely etiology of her symptoms. I also reviewed this with Pharmacy and tinnitus was not a reported side-effect with any of her cardiac medications. She is scheduled to see an ENT specialist later this month.   She reports good compliance and her Bystolic, Flecainide, and Xarelto. She denies any  evidence of active bleeding.    Past Medical History:  Diagnosis Date  . Atrial fibrillation ([place])    a. s/p DCCV in 12/2012 b. repeat DCCV in 09/2014 - started on Flecainide  . Avascular necrosis of bone of right hip ([place])   . Hypertension   . Morbid obesity ([place])   . Osteoarthritis   . Personal history of colonic polyps - large hyperplastic 12/25/2013  . Psoriasis    active breakout left buttocks  . Right hip pain   . Shortness of breath   . Tachycardia, unspecified     Past Surgical History:  Procedure Laterality Date  . CARDIOVERSION N/A 01/09/2013   Procedure: CARDIOVERSION;  Surgeon: Monica Perla, MD;  Location: [facility] ENDOSCOPY;  Service: Cardiovascular;  Laterality: N/A;  . CARDIOVERSION N/A 10/07/2014   Procedure: CARDIOVERSION;  Surgeon: Monica Perla, MD;  Location: [facility] ENDOSCOPY;  Service: Cardiovascular;  Laterality: N/A;  09:00 Lido 60mg ,IV followed by Propofol  70mg /IV    synched electrocardioversion at 120 joules for Afib,repeated at 200 joules, 70 mg...successfully changed to SR  . FRACTURE SURGERY  07/21/12   right hip arthroplasty  . TOTAL HIP ARTHROPLASTY  07/21/2012   Procedure: TOTAL HIP ARTHROPLASTY;  Surgeon: Monica Alf, MD;  Location: [facility] ORS;  Service: Orthopedics;  Laterality: Right;    Current Medications: Outpatient Medications Prior to Visit  Medication Sig Dispense Refill  . diclofenac sodium (VOLTAREN) 1 % GEL Apply 4 g topically 4 (four) times daily. 5  Tube 1  . flecainide (TAMBOCOR) 50 MG tablet TAKE 1 TABLET (50 MG TOTAL) BY MOUTH 2 (TWO) TIMES DAILY. 180 tablet 3  . fluticasone (FLONASE) 50 MCG/ACT nasal spray Place 2 sprays into both nostrils daily. (Patient taking differently: Place 2 sprays into both nostrils as needed. ) 16 g 11  . Multiple Vitamins-Minerals (MULTIVITAMIN WITH MINERALS) tablet Take 1 tablet by mouth daily with supper.    . nebivolol (BYSTOLIC) 5 MG tablet Take 5 mg by mouth daily.    . potassium chloride  (K-DUR,KLOR-CON) 10 MEQ tablet Take 1 tablet (10 mEq total) by mouth 2 (two) times daily. (Patient taking differently: Take 10 mEq by mouth daily. ) 180 tablet 3  . Vitamin D, Ergocalciferol, (DRISDOL) 50000 UNITS CAPS capsule Take 1 capsule (50,000 Units total) by mouth 2 (two) times a week. 8 capsule 11  . XARELTO 20 MG TABS tablet TAKE 1 TABLET BY MOUTH DAILY WITH SUPPER. REFILLS REQUIRE OFFICE VISIT 30 tablet 0  . CELEBREX 200 MG capsule TAKE 1 CAPSULE BY MOUTH 2 TIMES DAILY. (Patient not taking: Reported on 11/19/2016) 60 capsule 5  . etanercept (ENBREL) 50 MG/ML injection Inject 50 mg into the skin once a week.    . furosemide (LASIX) 40 MG tablet Take 1 tablet (40 mg total) by mouth 2 (two) times daily. (Patient taking differently: Take 40 mg by mouth daily. ) 180 tablet 3  . methotrexate (RHEUMATREX) 2.5 MG tablet   0  . pantoprazole (PROTONIX) 40 MG tablet Take 1 tablet (40 mg total) by mouth daily before breakfast. (Patient not taking: Reported on 11/19/2016) 30 tablet 11   No facility-administered medications prior to visit.      Allergies:   Patient has no known allergies.   Social History   Social History  . Marital status: Divorced    Spouse name: Monica [name]  . Number of children: 1  . Years of education: 31   Occupational History  . ED [employer]    Social History Main Topics  . Smoking status: Former Smoker    Types: Cigarettes    Quit date: 09/13/1982  . Smokeless tobacco: Never Used  . Alcohol use 0.0 oz/week     Comment: Occasionally  . Drug use: No  . Sexual activity: Yes    Partners: Male   Other Topics Concern  . 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  She used to  smoke 4 cigarettes per day for five years and quit 32 years ago.    Alcohol Use:  Occasional   Drug Use:  None   Diet:  Regular   Exercise:  Water Aerobics (2 x per week)     Hobbies: Shopping, Cycling               Family History:  The patient's family history includes Alzheimer's disease in her mother; Diabetes in her mother and son; Hypertension in her mother; Thyroid disease in her mother.   Review of Systems:   Please see the history of present illness.     General:  No chills, fever, night sweats or weight changes.  Cardiovascular:  No chest pain,  dyspnea on exertion, edema, orthopnea, palpitations, paroxysmal nocturnal dyspnea. Dermatological: No rash, lesions/masses Respiratory: No cough, dyspnea Urologic: No hematuria, dysuria Abdominal:   No nausea, vomiting, diarrhea, bright red blood per rectum, melena, or hematemesis Neurologic:  No visual changes, wkns, changes in mental status. Positive for tinnitus.   All other systems reviewed and are otherwise negative except as noted above.   Physical Exam:    VS:  BP (!) 140/98   Pulse 62   Ht 5' 5.5" (1.664 m)   Wt 284 lb (128.8 kg)   LMP 06/19/2013   BMI 46.54 kg/m    General: Well developed, obese African American female appearing in no acute distress. Head: Normocephalic, atraumatic, sclera non-icteric, no xanthomas, nares are without discharge.  Neck: No carotid bruits. JVD not elevated.  Lungs: Respirations regular and unlabored, without wheezes or rales.  Heart: Regular rate and rhythm. No S3 or S4.  No murmur, no rubs, or gallops appreciated. Abdomen: Soft, non-tender, non-distended with normoactive bowel sounds. No hepatomegaly. No rebound/guarding. No obvious abdominal masses. Msk:  Strength and tone appear normal for age. No joint deformities or effusions. Extremities: No clubbing or cyanosis. Trace lower extremity edema.  Distal pedal pulses are 2+ bilaterally. Neuro: Alert and oriented X 3. Moves all  extremities spontaneously. No focal deficits noted. Psych:  Responds to questions appropriately with a normal affect. Skin: No rashes or lesions noted  Wt Readings from Last 3 Encounters:  11/19/16 284 lb (128.8 kg)  06/26/16 280 lb (127 kg)  01/10/16 284 lb 4 oz (128.9 kg)     Studies/Labs Reviewed:   EKG:  EKG is ordered today.  The ekg ordered today demonstrates NSR, HR 63, with no acute ST or T-wave changes.   Recent Labs: No results found for requested labs within last 8760 hours.   Lipid Panel    Component Value Date/Time   CHOL 149 07/24/2013 0922   TRIG 101 07/24/2013 0922   HDL 42 07/24/2013 0922   CHOLHDL 3.5 07/24/2013 0922   VLDL 20 07/24/2013 0922   LDLCALC 87 07/24/2013 0922    Additional studies/ records that were reviewed today include:   Echocardiogram: 10/11/2014 Study Conclusions  - Left ventricle: The cavity size was normal. Wall thickness was normal. The estimated ejection fraction was 55%. - Mitral valve: There was mild to moderate regurgitation. - Left atrium: The atrium was severely dilated. - Right atrium: The atrium was mildly dilated. - Atrial septum: There was increased thickness of the septum, consistent with lipomatous hypertrophy. No defect or patent foramen ovale was identified. - Tricuspid valve: There was moderate regurgitation. - Pulmonary arteries: PA peak pressure: 48 mm Hg (S).  Assessment:    1. Paroxysmal atrial fibrillation (HCC)   2. Chronic anticoagulation   3. Medication management   4. Essential hypertension, benign   5. Morbid obesity ([place])   6. Tinnitus of both ears      Plan:   In order of problems listed above:  1. Paroxysmal atrial fibrillation/ Chronic Anticoagulation - s/p DCCV in 12/2012, repeat DCCV in 09/2014 and started on Flecainide at that time.  - she denies any recent palpitations, chest discomfort, or dyspnea. EKG today shows she is maintaining NSR.  - This patients CHA2DS2-VASc Score and  unadjusted Ischemic Stroke Rate (% per year) is equal to 2.2 % stroke rate/year from a score of 2 (HTN, Female). She denies any evidence of active bleeding. Continue Xarelto for anticoagulation.  - continue Flecainide and Bystolic.  2. HTN - BP elevated at 140/98 today. Repots having not taken her Bystolic for today due to her appointment. Recommended following BP at home. May need further titration up to 10mg  daily if BP remains elevated.   3. Morbid Obesity - BMI elevated at 46.54. - continued diet and exercise encouraged.  4. Tinnitus - the patient has been experiencing this for the past month. Reviewed her current cardiac medications with Pharmacy and tinnitus was not a reported side-effect. We discussed alternatives to Xarelto but she wishes to remain on once daily dosing for her anticoagulation and I informed her this was an unlikely cause of her symptoms with her being on the medication for 2+ years.  - will check BMET today, for when she stopped Lasix she did not stop her K+ supplementation. No longer taking Maxzide as she wishes to remain off a Rx fluid pill and continue with her OTC diuretic medication. I recommended monitoring her fluid status closely as she already has mild lower extremity edema on examination today.  - recommended she keep her appointment with ENT.    Medication Adjustments/Labs and Tests Ordered: Current medicines are reviewed at length with the patient today.  Concerns regarding medicines are outlined above.  Medication changes, Labs and Tests ordered today are listed in the Patient Instructions below. Patient Instructions  Medication Instructions:  Your physician recommends that you continue on your current medications as directed. Please refer to the Current Medication list given to you today.  Labwork: Please have labwork TODAY (BMET) at [place]: [address]  Testing/Procedures: NONE  Follow-Up: Your physician wants you to follow-up  in: 1 YEAR with Dr. Stanford [name]. You will receive a reminder letter in the mail two months in advance. If you don't receive a letter, please call our office to schedule the follow-up appointment.  Any Other Special Instructions Will Be Listed Below (If Applicable).  If you need a refill on your cardiac medications before your next appointment, please call your pharmacy.  Signed, Erma Heritage, PA-C  11/19/2016 8:51 PM    [facility] [address], [address] [place], [place]  [address] Phone: [phone]; Fax: [phone]  [address], [place], [place] [address] Phone: [phone]

## 2016-11-19 ENCOUNTER — Encounter: Payer: Self-pay | Admitting: Student

## 2016-11-19 ENCOUNTER — Ambulatory Visit (INDEPENDENT_AMBULATORY_CARE_PROVIDER_SITE_OTHER): Payer: 59 | Admitting: Student

## 2016-11-19 VITALS — BP 140/98 | HR 62 | Ht 65.5 in | Wt 284.0 lb

## 2016-11-19 DIAGNOSIS — I10 Essential (primary) hypertension: Secondary | ICD-10-CM | POA: Diagnosis not present

## 2016-11-19 DIAGNOSIS — H9313 Tinnitus, bilateral: Secondary | ICD-10-CM

## 2016-11-19 DIAGNOSIS — Z79899 Other long term (current) drug therapy: Secondary | ICD-10-CM | POA: Diagnosis not present

## 2016-11-19 DIAGNOSIS — Z7901 Long term (current) use of anticoagulants: Secondary | ICD-10-CM

## 2016-11-19 DIAGNOSIS — H9319 Tinnitus, unspecified ear: Secondary | ICD-10-CM | POA: Insufficient documentation

## 2016-11-19 DIAGNOSIS — I480 Paroxysmal atrial fibrillation: Secondary | ICD-10-CM | POA: Diagnosis not present

## 2016-11-19 HISTORY — DX: Long term (current) use of anticoagulants: Z79.01

## 2016-11-19 MED ORDER — XARELTO 20 MG PO TABS: 20.0000 mg | ORAL_TABLET | Freq: Every day | ORAL | 11 refills | Status: DC

## 2016-11-19 NOTE — Patient Instructions (Signed)
Medication Instructions:  Your physician recommends that you continue on your current medications as directed. Please refer to the Current Medication list given to you today.  Labwork: Please have labwork TODAY (BMET) at [place]: [address]  Testing/Procedures: NONE  Follow-Up: Your physician wants you to follow-up in: 1 YEAR with Dr. Stanford Breed. You will receive a reminder letter in the mail two months in advance. If you don't receive a letter, please call our office to schedule the follow-up appointment.   Any Other Special Instructions Will Be Listed Below (If Applicable).     If you need a refill on your cardiac medications before your next appointment, please call your pharmacy.

## 2016-11-20 LAB — BASIC METABOLIC PANEL
BUN / CREAT RATIO: 8 — AB (ref 9–23)
BUN: 6 mg/dL (ref 6–24)
CHLORIDE: 103 mmol/L (ref 96–106)
CO2: 26 mmol/L (ref 18–29)
Calcium: 8.7 mg/dL (ref 8.7–10.2)
Creatinine, Ser: 0.75 mg/dL (ref 0.57–1.00)
GFR calc Af Amer: 102 mL/min/{1.73_m2} (ref >59)
GFR calc non Af Amer: 88 mL/min/{1.73_m2} (ref >59)
Glucose: 93 mg/dL (ref 65–99)
Potassium: 3.6 mmol/L (ref 3.5–5.2)
Sodium: 144 mmol/L (ref 134–144)

## 2016-11-25 DIAGNOSIS — I10 Essential (primary) hypertension: Secondary | ICD-10-CM | POA: Diagnosis not present

## 2016-11-25 DIAGNOSIS — H9319 Tinnitus, unspecified ear: Secondary | ICD-10-CM | POA: Diagnosis not present

## 2016-12-06 ENCOUNTER — Other Ambulatory Visit: Payer: Self-pay | Admitting: Cardiology

## 2016-12-07 MED FILL — XARELTO 20 MG TABLET: 20 | 30 days supply | Qty: 30 | Fill #0

## 2016-12-08 MED FILL — HYDROCHLOROTHIAZIDE 25 MG T: 25 | 90 days supply | Qty: 90 | Fill #0

## 2016-12-08 MED FILL — SERTRALINE HCL 50 MG TABLET: 50 | 90 days supply | Qty: 90 | Fill #0

## 2016-12-14 MED FILL — POTASSIUM CL 10 MEQ TAB SA: 10 | 30 days supply | Qty: 30 | Fill #1

## 2016-12-21 MED FILL — DICLOFENAC SODIUM 1% GEL: 1 | 7 days supply | Qty: 100 | Fill #1

## 2016-12-21 MED FILL — VIT D2 1.25 MG (50,000 UNIT: 1.25 MG | 90 days supply | Qty: 12 | Fill #1

## 2016-12-29 MED FILL — BYSTOLIC 10 MG TABLET: 10 | 30 days supply | Qty: 30 | Fill #1

## 2017-01-03 MED FILL — FLECAINIDE ACETATE 50 MG TA: 50 | 90 days supply | Qty: 180 | Fill #3

## 2017-01-11 MED FILL — XARELTO 20 MG TABLET: 20 | 30 days supply | Qty: 30 | Fill #0

## 2017-01-27 DIAGNOSIS — H903 Sensorineural hearing loss, bilateral: Secondary | ICD-10-CM | POA: Diagnosis not present

## 2017-01-27 DIAGNOSIS — H9313 Tinnitus, bilateral: Secondary | ICD-10-CM | POA: Diagnosis not present

## 2017-02-04 ENCOUNTER — Other Ambulatory Visit: Payer: Self-pay | Admitting: Otolaryngology

## 2017-02-04 DIAGNOSIS — H9313 Tinnitus, bilateral: Secondary | ICD-10-CM

## 2017-02-04 DIAGNOSIS — H903 Sensorineural hearing loss, bilateral: Secondary | ICD-10-CM

## 2017-02-07 ENCOUNTER — Telehealth: Payer: 59 | Admitting: Family

## 2017-02-07 DIAGNOSIS — H1013 Acute atopic conjunctivitis, bilateral: Secondary | ICD-10-CM

## 2017-02-07 MED ORDER — OLOPATADINE HCL 0.2 % OP SOLN: 1.0000 [drp] | Freq: Every day | OPHTHALMIC | 0 refills | Status: DC

## 2017-02-07 MED ORDER — FLUTICASONE PROPIONATE 50 MCG/ACT NA SUSP: 2.0000 | NASAL | 0 refills | Status: DC | PRN

## 2017-02-07 MED FILL — OLOPATADINE HCL 0.2 % SOLN: 0.2 | 50 days supply | Qty: 3 | Fill #0

## 2017-02-07 MED FILL — FLUTICASONE PROP 50 MCG SPR: 50 | 30 days supply | Qty: 16 | Fill #0

## 2017-02-07 NOTE — Progress Notes (Signed)
E visit for Allergic Rhinitis We are sorry that you are not feeling well.  Her is how we plan to help!  Based on what you have shared with me it looks like you have Allergic Rhinitis.  Rhinitis is when a reaction occurs that causes nasal congestion, runny nose, sneezing, and itching.  Most types of rhinitis are caused by an inflammation and are associated with symptoms in the eyes ears or throat. There are several types of rhinitis.  The most common are acute rhinitis, which is usually caused by a viral illness, allergic or seasonal rhinitis, and nonallergic or year-round rhinitis.  Nasal allergies occur certain times of the year.  Allergic rhinitis is caused when allergens in the air trigger the release of histamine in the body.  Histamine causes itching, swelling, and fluid to build up in the fragile linings of the nasal passages, sinuses and eyelids.  An itchy nose and clear discharge are common.  I recommend the following over the counter treatments: You should take a daily dose of antihistamine and Xyzal 5 mg take 1 tablet daily  I also would recommend a nasal spray: Flonase 2 sprays into each nostril once daily  I sent in a prescription of allergy eye drops dr  HOME CARE:   You can use an over-the-counter saline nasal spray as needed  Avoid areas where there is heavy dust, mites, or molds  Stay indoors on windy days during the pollen season  Keep windows closed in home, at least in bedroom; use air conditioner.  Use high-efficiency house air filter  Keep windows closed in car, turn AC on re-circulate  Avoid playing out with dog during pollen season  GET HELP RIGHT AWAY IF:   If your symptoms do not improve within 10 days  You become short of breath  You develop yellow or green discharge from your nose for over 3 days  You have coughing fits  MAKE SURE YOU:   Understand these instructions  Will watch your condition  Will get help right away if you are not doing  well or get worse  Thank you for choosing an e-visit. Your e-visit answers were reviewed by a board certified advanced clinical practitioner to complete your personal care plan. Depending upon the condition, your plan could have included both over the counter or prescription medications. Please review your pharmacy choice. Be sure that the pharmacy you have chosen is open so that you can pick up your prescription now.  If there is a problem you may message your provider in [place] to have the prescription routed to another pharmacy. Your safety is important to [place]. If you have drug allergies check your prescription carefully.  For the next 24 hours, you can use MyChart to ask questions about today's visit, request a non-urgent call back, or ask for a work or school excuse from your e-visit provider. You will get an email in the next two days asking about your experience. I hope that your e-visit has been valuable and will speed your recovery.

## 2017-02-09 MED FILL — XARELTO 20 MG TABLET: 20 | 30 days supply | Qty: 30 | Fill #1

## 2017-02-09 MED FILL — POTASSIUM CL 10 MEQ TAB SA: 10 | 30 days supply | Qty: 30 | Fill #2

## 2017-02-17 ENCOUNTER — Ambulatory Visit (INDEPENDENT_AMBULATORY_CARE_PROVIDER_SITE_OTHER): Payer: 59 | Admitting: Family Medicine

## 2017-02-17 ENCOUNTER — Encounter: Payer: Self-pay | Admitting: Family Medicine

## 2017-02-17 VITALS — BP 123/82 | HR 72 | Ht 66.0 in

## 2017-02-17 DIAGNOSIS — M25552 Pain in left hip: Secondary | ICD-10-CM

## 2017-02-18 DIAGNOSIS — M25552 Pain in left hip: Secondary | ICD-10-CM

## 2017-02-18 MED ORDER — METHYLPREDNISOLONE ACETATE 40 MG/ML IJ SUSP
40.0000 mg | Freq: Once | INTRAMUSCULAR | Status: AC
  Administered 2017-02-18: 40 mg via INTRA_ARTICULAR

## 2017-02-18 MED ORDER — METHYLPREDNISOLONE ACETATE 40 MG/ML IJ SUSP
40.0000 mg | Freq: Once | INTRAMUSCULAR | Status: AC
Start: 2017-02-18 — End: 2017-02-18
  Administered 2017-02-18: 40 mg via INTRA_ARTICULAR

## 2017-02-18 MED FILL — COSENTYX 300 MG DOSE-2 PENS: 150 | 28 days supply | Qty: 2 | Fill #0

## 2017-02-22 NOTE — Assessment & Plan Note (Signed)
2/2 trochanteric bursitis and underlying arthritis.  Reviewed home exercises and stretches again.  Icing, tylenol if needed.  Injection given today in bursa.  F/u prn.    After informed written consent patient was lying on right side on exam table.  Area overlying left greater trochanteric bursa prepped with alcohol swab then bursa injected with 6:2 bupivicaine: depomedrol.  Patient tolerated procedure well without immediate complications.

## 2017-02-22 NOTE — Progress Notes (Signed)
PCP: [name], MD  Subjective:   HPI: Patient is a 61 y.o. female here for left hip pain.  10/16/15: Patient denies known injury or trauma. She reports 4/10 level of pain lateral left hip. Worse lying on this side, sharp. No numbness or tingling. Using pain medication which helps temporarily. No bowel/bladder dysfunction.  02/17/17: Patient reports she has had recurrence of left hip pain. Pain level 3/10 at rest but up to 8/10 and sharp with walking. Pain is lateral and into hip, down knee. Using voltaren gel which helps. Planning to go to [place] in a couple days and will do a lot of walking. No skin changes, numbness. No bowel/bladder dysfunction.  Past Medical History:  Diagnosis Date  . Atrial fibrillation ([place])    a. s/p DCCV in 12/2012 b. repeat DCCV in 09/2014 - started on Flecainide  . Avascular necrosis of bone of right hip ([place])   . Hypertension   . Morbid obesity ([place])   . Osteoarthritis   . Personal history of colonic polyps - large hyperplastic 12/25/2013  . Psoriasis    active breakout left buttocks  . Right hip pain   . Shortness of breath   . Tachycardia, unspecified     Current Outpatient Prescriptions on File Prior to Visit  Medication Sig Dispense Refill  . BYSTOLIC 10 MG tablet Take 1 tablet by mouth daily.  3  . diclofenac sodium (VOLTAREN) 1 % GEL Apply 4 g topically 4 (four) times daily. 5 Tube 1  . flecainide (TAMBOCOR) 50 MG tablet TAKE 1 TABLET (50 MG TOTAL) BY MOUTH 2 (TWO) TIMES DAILY. 180 tablet 3  . fluticasone (FLONASE) 50 MCG/ACT nasal spray Place 2 sprays into both nostrils as needed. 16 g 0  . Multiple Vitamins-Minerals (MULTIVITAMIN WITH MINERALS) tablet Take 1 tablet by mouth daily with supper.    . nebivolol (BYSTOLIC) 5 MG tablet Take 5 mg by mouth daily.    . Olopatadine HCl 0.2 % SOLN Apply 1 drop to eye daily. 5 mL 0  . potassium chloride (K-DUR,KLOR-CON) 10 MEQ tablet Take 1 tablet (10 mEq total) by mouth 2 (two) times  daily. (Patient taking differently: Take 10 mEq by mouth daily. ) 180 tablet 3  . Secukinumab (COSENTYX 300 DOSE [place]) Inject 1 each into the skin every 30 (thirty) days.    . Vitamin D, Ergocalciferol, (DRISDOL) 50000 UNITS CAPS capsule Take 1 capsule (50,000 Units total) by mouth 2 (two) times a week. 8 capsule 11  . XARELTO 20 MG TABS tablet TAKE 1 TABLET BY MOUTH DAILY WITH SUPPER **REFILLS REQUIRE OFFICE VISIT** 30 tablet 0   No current facility-administered medications on file prior to visit.     Past Surgical History:  Procedure Laterality Date  . CARDIOVERSION N/A 01/09/2013   Procedure: CARDIOVERSION;  Surgeon: [name], MD;  Location: [facility] ENDOSCOPY;  Service: Cardiovascular;  Laterality: N/A;  . CARDIOVERSION N/A 10/07/2014   Procedure: CARDIOVERSION;  Surgeon: [name], MD;  Location: [facility] ENDOSCOPY;  Service: Cardiovascular;  Laterality: N/A;  09:00 Lido 60mg ,IV followed by Propofol  70mg /IV    synched electrocardioversion at 120 joules for Afib,repeated at 200 joules, 70 mg...successfully changed to SR  . FRACTURE SURGERY  07/21/12   right hip arthroplasty  . TOTAL HIP ARTHROPLASTY  07/21/2012   Procedure: TOTAL HIP ARTHROPLASTY;  Surgeon: [name], MD;  Location: [facility] ORS;  Service: Orthopedics;  Laterality: Right;    No Known Allergies  Social History  Social History  . Marital status: Divorced    Spouse name: [name]  . Number of children: 1  . Years of education: 67   Occupational History  . ED [employer]    Social History Main Topics  . Smoking status: Former Smoker    Types: Cigarettes    Quit date: 09/13/1982  . Smokeless tobacco: Never Used  . Alcohol use 0.0 oz/week     Comment: Occasionally  . Drug use: No  . Sexual activity: Yes    Partners: Male   Other Topics Concern  . 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  She used to smoke 4 cigarettes per day for five years and quit 32 years ago.    Alcohol Use:  Occasional   Drug Use:  None   Diet:  Regular   Exercise:  Water Aerobics (2 x per week)     Hobbies: Shopping, Cycling              Family History  Problem Relation Age of Onset  . Hypertension Mother   . Diabetes Mother   . Alzheimer's disease Mother   . Thyroid disease Mother   . Diabetes Son     BP 123/82   Pulse 72   Ht 5\' 6"  (1.676 m)   LMP 06/19/2013   Review of Systems: See HPI above.    Objective:  Physical Exam:  Gen: NAD, comfortable in exam room  Back/left hip: No gross deformity, scoliosis. TTP over greater trochanter.  No other tenderness.  No midline or bony TTP. Mild limitation IR and a little pain. Strength LEs 5/5 all muscle groups except 4/5 with left hip abduction, painful.   Negative SLRs. Sensation intact to light touch bilaterally.  Right hip: FROM without pain.    Assessment & Plan:  1. Left hip pain - 2/2 trochanteric bursitis and underlying arthritis.  Reviewed home exercises and stretches again.  Icing, tylenol if needed.  Injection given today in bursa.  F/u prn.    After informed written consent patient was lying on right side on exam table.  Area overlying left greater trochanteric bursa prepped with alcohol swab then bursa injected with 6:2 bupivicaine: depomedrol.  Patient tolerated procedure well without immediate complications.

## 2017-03-01 MED FILL — BYSTOLIC 10 MG TABLET: 10 | 30 days supply | Qty: 30 | Fill #2

## 2017-03-09 MED FILL — XARELTO 20 MG TABLET: 20 | 30 days supply | Qty: 30 | Fill #2

## 2017-03-16 MED FILL — HYDROCHLOROTHIAZIDE 25 MG T: 25 | 90 days supply | Qty: 90 | Fill #1

## 2017-03-16 MED FILL — SERTRALINE HCL 50 MG TABLET: 50 | 90 days supply | Qty: 90 | Fill #1

## 2017-03-25 MED FILL — COSENTYX 300 MG DOSE-2 PENS: 150 | 28 days supply | Qty: 2 | Fill #1

## 2017-04-08 ENCOUNTER — Other Ambulatory Visit: Payer: Self-pay | Admitting: Cardiology

## 2017-04-08 MED FILL — FLECAINIDE ACETATE 50 MG TA: 50 | 90 days supply | Qty: 180 | Fill #0

## 2017-04-08 MED FILL — XARELTO 20 MG TABLET: 20 | 30 days supply | Qty: 30 | Fill #3

## 2017-04-26 MED FILL — VIT D2 1.25 MG (50,000 UNIT: 1.25 MG | 90 days supply | Qty: 12 | Fill #2

## 2017-04-26 MED FILL — POTASSIUM CL 10 MEQ TAB SA: 10 | 30 days supply | Qty: 30 | Fill #3

## 2017-04-26 MED FILL — BYSTOLIC 10 MG TABLET: 10 | 30 days supply | Qty: 30 | Fill #3

## 2017-05-10 MED FILL — XARELTO 20 MG TABLET: 20 | 30 days supply | Qty: 30 | Fill #4

## 2017-05-25 MED FILL — POTASSIUM CL ER 10 MEQ TAB: 10 | 30 days supply | Qty: 30 | Fill #4

## 2017-05-26 DIAGNOSIS — Z79899 Other long term (current) drug therapy: Secondary | ICD-10-CM | POA: Diagnosis not present

## 2017-05-26 DIAGNOSIS — L400 Psoriasis vulgaris: Secondary | ICD-10-CM | POA: Diagnosis not present

## 2017-05-26 MED FILL — CLOBETASOL 0.05% OINTMENT: 0.05 | 10 days supply | Qty: 15 | Fill #0

## 2017-05-26 MED FILL — DESONIDE 0.05% CREAM: 0.05 | 30 days supply | Qty: 60 | Fill #0

## 2017-05-26 MED FILL — COSENTYX 300 MG DOSE-2 PENS: 150 | 28 days supply | Qty: 2 | Fill #0

## 2017-06-03 MED FILL — CLOBETASOL 0.05% OINTMENT: 0.05 | 30 days supply | Qty: 60 | Fill #0

## 2017-06-07 MED FILL — XARELTO 20 MG TABLET: 20 | 30 days supply | Qty: 30 | Fill #5

## 2017-06-10 DIAGNOSIS — Z111 Encounter for screening for respiratory tuberculosis: Secondary | ICD-10-CM | POA: Diagnosis not present

## 2017-06-10 DIAGNOSIS — M2550 Pain in unspecified joint: Secondary | ICD-10-CM | POA: Diagnosis not present

## 2017-06-10 DIAGNOSIS — I10 Essential (primary) hypertension: Secondary | ICD-10-CM | POA: Diagnosis not present

## 2017-06-10 DIAGNOSIS — E876 Hypokalemia: Secondary | ICD-10-CM | POA: Diagnosis not present

## 2017-06-10 DIAGNOSIS — H9313 Tinnitus, bilateral: Secondary | ICD-10-CM | POA: Diagnosis not present

## 2017-06-10 DIAGNOSIS — R7301 Impaired fasting glucose: Secondary | ICD-10-CM | POA: Diagnosis not present

## 2017-06-10 DIAGNOSIS — R768 Other specified abnormal immunological findings in serum: Secondary | ICD-10-CM | POA: Diagnosis not present

## 2017-06-10 DIAGNOSIS — K219 Gastro-esophageal reflux disease without esophagitis: Secondary | ICD-10-CM | POA: Diagnosis not present

## 2017-06-10 MED FILL — SERTRALINE HCL 50 MG TABLET: 50 | 90 days supply | Qty: 90 | Fill #0

## 2017-06-10 MED FILL — HYDROCHLOROTHIAZIDE 25 MG T: 25 | 90 days supply | Qty: 90 | Fill #0

## 2017-06-10 MED FILL — NORTRIPTYLINE HCL 10 MG CAP: 10 | 90 days supply | Qty: 90 | Fill #0

## 2017-06-21 MED FILL — COSENTYX 300 MG DOSE-2 PENS: 150 | 28 days supply | Qty: 2 | Fill #1

## 2017-06-22 MED FILL — POTASSIUM CL ER 10 MEQ TAB: 10 | 30 days supply | Qty: 30 | Fill #5

## 2017-06-22 MED FILL — BYSTOLIC 10 MG TABLET: 10 | 30 days supply | Qty: 30 | Fill #4

## 2017-07-11 MED FILL — XARELTO 20 MG TABLET: 20 | 30 days supply | Qty: 30 | Fill #6

## 2017-07-11 MED FILL — FLECAINIDE ACETATE 50 MG TA: 50 | 90 days supply | Qty: 180 | Fill #1

## 2017-07-19 MED FILL — POTASSIUM CL 10 MEQ TAB SA: 10 | 30 days supply | Qty: 30 | Fill #6

## 2017-07-19 MED FILL — COSENTYX 300 MG DOSE-2 PENS: 150 | 28 days supply | Qty: 2 | Fill #2

## 2017-07-22 MED FILL — METFORMIN HCL ER 500 MG TAB: 500 | 30 days supply | Qty: 120 | Fill #0

## 2017-07-26 ENCOUNTER — Other Ambulatory Visit (HOSPITAL_BASED_OUTPATIENT_CLINIC_OR_DEPARTMENT_OTHER): Payer: Self-pay | Admitting: Family Medicine

## 2017-07-26 DIAGNOSIS — Z1231 Encounter for screening mammogram for malignant neoplasm of breast: Secondary | ICD-10-CM

## 2017-07-27 MED FILL — VIT D2 1.25 MG (50,000 UNIT: 1.25 MG | 84 days supply | Qty: 12 | Fill #3

## 2017-07-28 MED FILL — TRULICITY 1.5 MG/0.5 ML PEN: 1.5 | 28 days supply | Qty: 2 | Fill #0

## 2017-08-02 ENCOUNTER — Ambulatory Visit (HOSPITAL_BASED_OUTPATIENT_CLINIC_OR_DEPARTMENT_OTHER)
Admission: RE | Admit: 2017-08-02 | Discharge: 2017-08-02 | Disposition: A | Discharge status: Home / Self Care | Payer: 59 | Source: Ambulatory Visit | Attending: Family Medicine | Admitting: Family Medicine

## 2017-08-02 DIAGNOSIS — Z1231 Encounter for screening mammogram for malignant neoplasm of breast: Secondary | ICD-10-CM | POA: Insufficient documentation

## 2017-08-04 ENCOUNTER — Telehealth: Payer: Self-pay | Admitting: Pharmacist

## 2017-08-04 NOTE — Telephone Encounter (Signed)
Called patient to schedule an appointment for the [facility]. I was unable to reach the patient so I left a HIPAA-compliant message requesting that the patient return my call.   

## 2017-08-08 MED FILL — SERTRALINE HCL 50 MG TABLET: 50 | 5 days supply | Qty: 5 | Fill #1

## 2017-08-08 MED FILL — XARELTO 20 MG TABLET: 20 | 30 days supply | Qty: 30 | Fill #7

## 2017-08-08 MED FILL — BYSTOLIC 10 MG TABLET: 10 | 30 days supply | Qty: 30 | Fill #5

## 2017-08-08 MED FILL — FLECAINIDE ACETATE 50 MG TA: 50 | 5 days supply | Qty: 10 | Fill #2

## 2017-08-25 ENCOUNTER — Encounter: Payer: Self-pay | Admitting: Pharmacist

## 2017-08-25 ENCOUNTER — Ambulatory Visit: Payer: 59 | Attending: Internal Medicine | Admitting: Pharmacist

## 2017-08-25 DIAGNOSIS — Z79899 Other long term (current) drug therapy: Secondary | ICD-10-CM

## 2017-08-25 MED ORDER — SECUKINUMAB 150 MG/ML ~~LOC~~ SOAJ: 2.0000 mL | SUBCUTANEOUS | 2 refills | Status: DC

## 2017-08-25 MED FILL — COSENTYX 300 MG DOSE-2 PENS: 150 | 28 days supply | Qty: 2 | Fill #0

## 2017-08-25 NOTE — Progress Notes (Signed)
   S: Patient presents to [facility] for review of their specialty medication therapy.  Patient is currently taking Cosentyx for psoriasis. Patient is managed by Danella Sensing for this.   Adherence: denies any missed doses  Efficacy: reports some improvement in her plaques but still feels like she has a bad outbreak on her back. Feels like stress is contributing to it not improving. Patient has been referred to rheumatologist for possible psoriatic arthritis according to patient.  Dosing: 300 mg monthly  Dose adjustments: Renal: no dose adjustments (has not been studied) Hepatic: no dose adjustments (has not been studied)  Drug-drug interactions: none, Patient limited to certain medications because she is on flecainide for atrial fibrillation.  Screening: TB test: completed per patient  Monitoring: S/sx of infection: denies, takes vitamin C to help immune system. CBC S/sx of hypersensitivity S/sx of malignancy:   O:     Lab Results  Component Value Date   WBC 6.2 09/29/2015   HGB 11.9 (L) 09/29/2015   HCT 36.3 09/29/2015   MCV 87.3 09/29/2015   PLT 277 09/29/2015      Chemistry      Component Value Date/Time   NA 144 11/19/2016 1630   K 3.6 11/19/2016 1630   CL 103 11/19/2016 1630   CO2 26 11/19/2016 1630   BUN 6 11/19/2016 1630   CREATININE 0.75 11/19/2016 1630   CREATININE 0.81 09/29/2015 1510      Component Value Date/Time   CALCIUM 8.7 11/19/2016 1630   ALKPHOS 99 07/24/2013 0922   AST 15 07/24/2013 0922   ALT 19 07/24/2013 0922   BILITOT 0.3 07/24/2013 0922       A/P: 1. Medication review: Patient currently on Cosentyx for psoriasis and has had some improved in her skin but not much Denies any adverse effects. Reviewed Cosentyx with her, including the following: can increase risk of infection, use good infection prevention measures and let your doctor know if you do become sick. Other adverse effects including  hypersensitivity, IBD, and injection site reactions. No recommendations for any changes at this time.  Christella Hartigan, PharmD, BCPS, BCACP, [facility] [phone]

## 2017-08-29 MED FILL — TRULICITY 1.5 MG/0.5 ML PEN: 1.5 | 28 days supply | Qty: 2 | Fill #1

## 2017-08-30 MED FILL — POTASSIUM CL ER 10 MEQ TAB: 10 | 30 days supply | Qty: 30 | Fill #7

## 2017-09-09 NOTE — Progress Notes (Signed)
Office Visit Note  Patient: Monica [name]             Date of Birth: August 18, 1955           MRN: [id number]             PCP: [name], MD Referring: [name], MD Visit Date: 09/16/2017 Occupation: @GUAROCC @    Subjective:  Psoriasis and joint pain   History of Present Illness: Monica [name] is a 62 y.o. female seen in consultation per request of PCP.  According to patient her symptoms of psoriasis that started approximately 10 years ago.  She was initially treated with methotrexate and then switched to Enbrel.  She had courses of methotrexate for flares.  She states about 4 or 5 months ago Enbrel stopped working and she was switched to Cosyntex.  She has been on Cosyntex since October 2018.  She has been under care of Dr. Wilhemina Bonito.  She states recently she has been having increased joint pain.  She has known history of osteoarthritis.  She has had right total hip replacement about 5 years ago by Dr. Maureen Ralphs.  Recently she has been having pain and swelling in her bilateral hands, bilateral shoulders her left hip and her bilateral lower extremities.  She has difficulty walking and she gets very stiff after prolonged sitting.  She has been through very stressful time which is flared her psoriasis as well.  Activities of Daily Living:  Patient reports morning stiffness for  30   minutes.   Patient Reports nocturnal pain.  Difficulty dressing/grooming: Reports Difficulty climbing stairs: Reports Difficulty getting out of chair: Reports Difficulty using hands for taps, buttons, cutlery, and/or writing: Reports   Review of Systems  Constitutional: Positive for fatigue. Negative for night sweats, weight gain, weight loss and weakness.  HENT: Positive for mouth dryness. Negative for mouth sores, trouble swallowing, trouble swallowing and nose dryness.   Eyes: Negative for pain, redness, visual disturbance and dryness.  Respiratory: Negative for cough, shortness of breath and  difficulty breathing.   Cardiovascular: Negative for chest pain, palpitations, hypertension, irregular heartbeat and swelling in legs/feet.  Gastrointestinal: Negative for blood in stool, constipation and diarrhea.  Endocrine: Negative for increased urination.  Genitourinary: Negative for vaginal dryness.  Musculoskeletal: Positive for arthralgias, joint pain, joint swelling, myalgias, morning stiffness and myalgias. Negative for muscle weakness and muscle tenderness.  Skin: Positive for rash. Negative for color change, hair loss, skin tightness, ulcers and sensitivity to sunlight.       Psoriasis  Allergic/Immunologic: Negative for susceptible to infections.  Neurological: Negative for dizziness, memory loss and night sweats.  Hematological: Negative for swollen glands.  Psychiatric/Behavioral: Negative for sleep disturbance. The patient is not nervous/anxious.     PMFS History:  Patient Active Problem List   Diagnosis Date Noted  . Chronic anticoagulation 11/19/2016  . Tinnitus 11/19/2016  . Right knee pain 07/06/2016  . Left hip pain 10/21/2015  . Gastroesophageal reflux disease 09/19/2015  . Itchy eyes 03/12/2015  . Allergic conjunctivitis 03/12/2015  . OSA (obstructive sleep apnea) 02/20/2015  . Morbid obesity ([place]) 01/15/2015  . Lymphedema 06/19/2014  . Personal history of colonic polyps - large hyperplastic 12/25/2013  . Skin infection 10/14/2013  . Candidiasis of female genitalia 10/14/2013  . IFG (impaired fasting glucose) 10/14/2013  . Carpal tunnel syndrome 10/14/2013  . Cough 06/20/2013  . Allergic rhinitis, cause unspecified 06/20/2013  . Backache 06/05/2013  . Edema 12/20/2012  . Swelling  of limb 11/30/2012  . Essential hypertension, benign 11/30/2012  . Headache(784.0) 11/30/2012  . Avascular necrosis of hip ([place]) 07/21/2012  . Atrial fibrillation ([place]) 07/06/2012  . Hypertension 09/14/2011  . Psoriasis 08/16/2005    Past Medical History:  Diagnosis Date    . Atrial fibrillation ([place])    a. s/p DCCV in 12/2012 b. repeat DCCV in 09/2014 - started on Flecainide  . Avascular necrosis of bone of right hip ([place])   . Hypertension   . Morbid obesity ([place])   . Osteoarthritis   . Personal history of colonic polyps - large hyperplastic 12/25/2013  . Psoriasis    active breakout left buttocks  . Right hip pain   . Shortness of breath   . Tachycardia, unspecified     Family History  Problem Relation Age of Onset  . Hypertension Mother   . Diabetes Mother   . Alzheimer's disease Mother   . Thyroid disease Mother   . Diabetes Son    Past Surgical History:  Procedure Laterality Date  . CARDIOVERSION N/A 01/09/2013   Procedure: CARDIOVERSION;  Surgeon: [name], MD;  Location[facility] ENDOSCOPY;  Service: Cardiovascular;  Laterality: N/A;  . CARDIOVERSION N/A 10/07/2014   Procedure: CARDIOVERSION;  Surgeon: [name], MD;  Location: [facility] ENDOSCOPY;  Service: Cardiovascular;  Laterality: N/A;  09:00 Lido 29m,IV followed by Propofol  778mIV    synched electrocardioversion at 120 joules for Afib,repeated at 200 joules, 70 mg...successfully changed to SR  . FRACTURE SURGERY  07/21/12   right hip arthroplasty  . 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    Education: Field seismologist  Tobacco Use/Exposure: Former Smoker.  She used to smoke 4 cigarettes per day for five years and quit 32 years ago.    Alcohol Use:  Occasional   Drug Use:  None   Diet:  Regular   Exercise:  Water Aerobics (2 x per week)     Hobbies: Shopping, Cycling               Objective: Vital Signs:  BP 122/72   Pulse 70   Resp 14   Ht 5' 5.5" (1.664 m)   Wt 261 lb (118.4 kg)   LMP 06/19/2013   BMI 42.77 kg/m    Physical Exam  Constitutional: She is oriented to person, place, and time. She appears well-developed and well-nourished.  HENT:  Head: Normocephalic and atraumatic.  Eyes: Conjunctivae and EOM are normal.  Neck: Normal range of motion.  Cardiovascular: Normal rate, regular rhythm, normal heart sounds and intact distal pulses.  Pulmonary/Chest: Effort normal and breath sounds normal.  Abdominal: Soft. Bowel sounds are normal.  Lymphadenopathy:    She has no cervical adenopathy.  Neurological: She is alert and oriented to person, place, and time.  Skin: Skin is warm and dry. Capillary refill takes less than 2 seconds.   scalp, back and extremities  Psychiatric: She has a normal mood and affect. Her behavior is normal.  Nursing note and vitals reviewed.    Musculoskeletal Exam: C-spine thoracic spine good range of  motion.  She is some limitation of range of motion of her lumbar spine.  She has painful range of motion of bilateral shoulders.  Her elbow joints with good range of motion without synovitis.  She has no tenderness over her wrist joints.  She has synovitis of her MCP joints as described below.  Hip joints were good range of motion.  She has discomfort range of motion of her left hip.  She has warmth and swelling in the right knee joint.  Ankle joints have good range of motion.  She has some tenderness across her MTPs with no synovitis.  CDAI Exam: CDAI Homunculus Exam:   Tenderness:  RUE: glenohumeral LUE: glenohumeral Right hand: 3rd MCP and 4th MCP Left hand: 3rd MCP RLE: tibiofemoral LLE: acetabulofemoral  Swelling:  Right hand: 4th MCP Left hand: 3rd MCP RLE: tibiofemoral  Joint Counts:  CDAI Tender Joint count: 6 CDAI Swollen Joint count: 3  Global Assessments:  Patient Global Assessment: 7 Provider Global Assessment: 7  CDAI Calculated  Score: 23    Investigation: No additional findings. 06/10/17: ANA 1:640 speckled, RF -, ESR 52, uric acid 7.1, PPD negative July 12, 2012 chest x-ray normal  Imaging: Xr Foot 2 Views Left  Result Date: 09/16/2017 First MTP all PIP and DIP narrowing was noted.  No erosive changes were noted dorsal spurring was noted.  Inferior and posterior calcaneal spurs were noted. Impression: These findings are consistent with osteoarthritis of the foot.  Xr Foot 2 Views Right  Result Date: 09/16/2017 First MTP all PIP and DIP narrowing was noted.  No erosive changes were noted dorsal spurring was noted.  Inferior and posterior calcaneal spurs were noted. Impression: These findings are consistent with osteoarthritis of the foot.  Xr Hand 2 View Left  Result Date: 09/16/2017 PIP, DIP, CMC narrowing was noted.  No MCP joint narrowing was noted.  No erosive changes were noted. Impression: These findings are consistent with osteoarthritis of the hand.  Xr Hand 2 View Right  Result Date: 09/16/2017 PIP, DIP, CMC narrowing was noted.  No MCP joint narrowing was noted.  No erosive changes were noted. Impression: These findings are consistent with osteoarthritis of the hand.  Xr Knee 3 View Right  Result Date: 09/16/2017 Moderate medial compartment narrowing was noted.  Medial and intercondylar osteophytes were noted.  No chondrocalcinosis was noted.  Moderate patellofemoral narrowing was noted. Impression: These findings are consistent with moderate osteoarthritis and moderate chondromalacia patella  Xr Shoulder Left  Result Date: 09/16/2017 No glenohumeral joint space narrowing was noted.  No acromioclavicular joint space narrowing.  No chondrocalcinosis was noted. Impression: Unremarkable x-ray of the shoulder joint.  Xr Shoulder Right  Result Date: 09/16/2017 No glenohumeral joint space narrowing was noted.  No acromioclavicular joint space narrowing.  No chondrocalcinosis was noted. Impression:  Unremarkable x-ray of the shoulder joint.   Speciality Comments: No specialty comments available.    Procedures:  No procedures performed Allergies: Patient has no known allergies.   Assessment / Plan:     Visit Diagnoses: Pain in both hands - RF- (06/10/17) patient has active synovitis on her hands.  She has known history of psoriasis for 10 years.  She has multiple arthralgias.  In my opinion she has psoriatic arthritis.  She has been on Cosyntex for the last 4 months.  I think she would benefit by combination therapy with methotrexate.  Indication side effects contraindications of methotrexate was discussed.  Handout was given and consent was taken today.-  Plan: XR Hand 2 View Right, XR Hand 2 View Left, Cyclic citrul peptide antibody, IgG.  If her labs are normal I would start her on methotrexate starting at 6 tablets p.o. weekly and then increase it to 8 tablets p.o. weekly along with folic acid 2 mg p.o. daily.  She will need labs every 2 weeks x2 and then every 2 months to monitor for drug toxicity.  Drug Counseling TB Gold: PPD -2018 Hepatitis panel: Pending  Chest-xray: 2013  Contraception: Not applicable  Alcohol use: Discussed  Patient was counseled on the purpose, proper use, and adverse effects of methotrexate including nausea, infection, and signs and symptoms of pneumonitis.  Reviewed instructions with patient to take methotrexate weekly along with folic acid daily.  Discussed the importance of frequent monitoring of kidney and liver function and blood counts, and provided patient with standing lab instructions.  Counseled patient to avoid NSAIDs and alcohol while on methotrexate.  Provided patient with educational materials on methotrexate and answered all questions.  Advised patient to get annual influenza vaccine and to get a pneumococcal vaccine if patient has not already had one.  Patient voiced understanding.  Patient consented to methotrexate use.  Will upload into  chart.    Chronic pain of both shoulders.  She has discomfort range of motion of bilateral shoulders.- Plan: XR Shoulder Right, XR Shoulder Left  Chronic pain of right knee.  She has warmth and swelling in the right knee joint.- Plan: XR KNEE 3 VIEW RIGHT  Psoriasis - Cosentyx 300 mg subcu monthly.  It is prescribed by Dr. Ronnald Ramp.  High risk medication use - On Cosentyx - Plan: CBC with Differential/Platelet, COMPLETE METABOLIC PANEL WITH GFR, Urinalysis, Routine w reflex microscopic, HIV antibody, Serum protein electrophoresis with reflex, IgG, IgA, IgM, Hepatitis C antibody, Hepatitis B surface antigen, Hepatitis B core antibody, IgM  Positive ANA (antinuclear antibody) - 1:640 Speckled, Sed rate 52 - Plan: Anti-DNA antibody, double-stranded, Sjogrens syndrome-B extractable nuclear antibody, Sjogrens syndrome-A extractable nuclear antibody, Anti-Smith antibody, RNP Antibody, Anti-scleroderma antibody, C3 and C4  History of total right hip replacement: She has some discomfort.  Pain in both feet.  No synovitis was noted.- Plan: XR Foot 2 Views Right, XR Foot 2 Views Left  Carpal tunnel syndrome, unspecified laterality  Essential hypertension  Paroxysmal atrial fibrillation (HCC)  OSA (obstructive sleep apnea)  Chronic anticoagulation - on Xarelto  History of gastroesophageal reflux (GERD)  Other fatigue - Plan: CK, TSH    Orders: Orders Placed This Encounter  Procedures  . XR Shoulder Right  . XR Shoulder Left  . XR Hand 2 View Right  . XR Hand 2 View Left  . XR KNEE 3 VIEW RIGHT  . XR Foot 2 Views Right  . XR Foot 2 Views Left  . CBC with Differential/Platelet  . COMPLETE METABOLIC PANEL WITH GFR  . Urinalysis, Routine w reflex microscopic  . CK  . TSH  . Cyclic citrul peptide antibody, IgG  . Anti-DNA antibody, double-stranded  . Sjogrens syndrome-B extractable nuclear antibody  . Sjogrens syndrome-A extractable nuclear antibody  . Anti-Smith antibody  . RNP  Antibody  . Anti-scleroderma antibody  . C3 and C4  . HIV antibody  . Serum protein electrophoresis with reflex  . IgG, IgA, IgM  . Hepatitis C antibody  . Hepatitis B surface antigen  . Hepatitis B core antibody, IgM   No orders of the defined types were placed in this encounter.   Face-to-face time spent with  patient was 50 minutes.  Greater than 50% of time was spent in counseling and coordination of care.  Follow-Up Instructions: Return for Psoriatic arthritis.   Bo Merino, MD  Note - This record has been created using Editor, commissioning.  Chart creation errors have been sought, but may not always  have been located. Such creation errors do not reflect on  the standard of medical care.

## 2017-09-12 MED FILL — XARELTO 20 MG TABLET: 20 | 30 days supply | Qty: 30 | Fill #8

## 2017-09-12 MED FILL — SERTRALINE HCL 50 MG TABLET: 50 | 85 days supply | Qty: 85 | Fill #2

## 2017-09-14 MED FILL — HYDROCHLOROTHIAZIDE 25 MG T: 25 | 90 days supply | Qty: 90 | Fill #1

## 2017-09-16 ENCOUNTER — Ambulatory Visit (INDEPENDENT_AMBULATORY_CARE_PROVIDER_SITE_OTHER): Payer: Self-pay

## 2017-09-16 ENCOUNTER — Ambulatory Visit: Payer: 59 | Admitting: Rheumatology

## 2017-09-16 ENCOUNTER — Ambulatory Visit (INDEPENDENT_AMBULATORY_CARE_PROVIDER_SITE_OTHER): Payer: 59

## 2017-09-16 ENCOUNTER — Encounter: Payer: Self-pay | Admitting: Rheumatology

## 2017-09-16 VITALS — BP 122/72 | HR 70 | Resp 14 | Ht 65.5 in | Wt 261.0 lb

## 2017-09-16 DIAGNOSIS — M79642 Pain in left hand: Secondary | ICD-10-CM

## 2017-09-16 DIAGNOSIS — R5383 Other fatigue: Secondary | ICD-10-CM

## 2017-09-16 DIAGNOSIS — Z79899 Other long term (current) drug therapy: Secondary | ICD-10-CM | POA: Diagnosis not present

## 2017-09-16 DIAGNOSIS — M25561 Pain in right knee: Secondary | ICD-10-CM

## 2017-09-16 DIAGNOSIS — G8929 Other chronic pain: Secondary | ICD-10-CM | POA: Diagnosis not present

## 2017-09-16 DIAGNOSIS — M79672 Pain in left foot: Secondary | ICD-10-CM

## 2017-09-16 DIAGNOSIS — M79641 Pain in right hand: Secondary | ICD-10-CM

## 2017-09-16 DIAGNOSIS — M25512 Pain in left shoulder: Secondary | ICD-10-CM

## 2017-09-16 DIAGNOSIS — I10 Essential (primary) hypertension: Secondary | ICD-10-CM | POA: Diagnosis not present

## 2017-09-16 DIAGNOSIS — R768 Other specified abnormal immunological findings in serum: Secondary | ICD-10-CM | POA: Diagnosis not present

## 2017-09-16 DIAGNOSIS — M25511 Pain in right shoulder: Secondary | ICD-10-CM | POA: Diagnosis not present

## 2017-09-16 DIAGNOSIS — M79671 Pain in right foot: Secondary | ICD-10-CM

## 2017-09-16 DIAGNOSIS — G5600 Carpal tunnel syndrome, unspecified upper limb: Secondary | ICD-10-CM

## 2017-09-16 DIAGNOSIS — Z96641 Presence of right artificial hip joint: Secondary | ICD-10-CM

## 2017-09-16 DIAGNOSIS — L409 Psoriasis, unspecified: Secondary | ICD-10-CM | POA: Diagnosis not present

## 2017-09-16 DIAGNOSIS — G4733 Obstructive sleep apnea (adult) (pediatric): Secondary | ICD-10-CM

## 2017-09-16 DIAGNOSIS — Z8719 Personal history of other diseases of the digestive system: Secondary | ICD-10-CM

## 2017-09-16 DIAGNOSIS — Z7901 Long term (current) use of anticoagulants: Secondary | ICD-10-CM

## 2017-09-16 DIAGNOSIS — I480 Paroxysmal atrial fibrillation: Secondary | ICD-10-CM

## 2017-09-16 NOTE — Progress Notes (Deleted)
Office Visit Note   Patient: Monica [name]           Date of Birth: June 27, 1956           MRN: [id number] Visit Date: 09/16/2017              Requested by: [name], MD [address] [place], [place] [address] PCP: [name], MD   Assessment & Plan: Visit Diagnoses:  1. Positive ANA (antinuclear antibody)   2. Pain in both hands   3. Psoriasis   4. High risk medication use   5. Carpal tunnel syndrome, unspecified laterality   6. 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 Avascular necrosis of bone of hip, unspecified laterality ([place])   8. History of total right hip replacement   9. Essential hypertension   10. Paroxysmal atrial fibrillation (HCC)   11. OSA (obstructive sleep apnea)   12. Personal history of colonic polyps - large hyperplastic   13. Chronic anticoagulation   14. History of gastroesophageal reflux (GERD)     Plan: ***  Follow-Up Instructions: No Follow-up on file.   Orders:  No orders of the defined types were placed in this encounter.  No orders of the defined types were placed in this encounter.     Procedures: No procedures performed   Clinical Data: No additional findings.   Subjective: No chief complaint on file.   HPI  Review of Systems  Constitutional: Positive for fatigue. Negative for chills and fever.  HENT: Positive for sinus pain.   Eyes: Positive for pain. Negative for itching.  Respiratory: Negative for chest tightness and shortness of breath.   Cardiovascular: Negative for chest pain, palpitations and leg swelling.  Gastrointestinal: Negative for blood in stool, constipation and diarrhea.  Endocrine: Negative for polyuria.  Genitourinary: Negative for dysuria.  Musculoskeletal: Positive for arthralgias, joint swelling, neck pain and neck stiffness. Negative for back pain.  Skin: Positive for rash.  Allergic/Immunologic: Negative for immunocompromised state.    Neurological: Positive for numbness. Negative for dizziness.  Hematological: Positive for adenopathy. Does not bruise/bleed easily.  Psychiatric/Behavioral: Positive for sleep disturbance. The patient is not nervous/anxious.      Objective: Vital Signs: BP 122/72   Pulse 70   Resp 14   Ht 5' 5.5" (1.664 m)   Wt 261 lb (118.4 kg)   LMP 06/19/2013   BMI 42.77 kg/m   Physical Exam  Ortho Exam  Specialty Comments:  No specialty comments available.  Imaging: No results found.   PMFS History: Patient Active Problem List   Diagnosis Date Noted  . Chronic anticoagulation 11/19/2016  . Tinnitus 11/19/2016  . Right knee pain 07/06/2016  . Left hip pain 10/21/2015  . Gastroesophageal reflux disease 09/19/2015  . Itchy eyes 03/12/2015  . Allergic conjunctivitis 03/12/2015  . OSA (obstructive sleep apnea) 02/20/2015  . Morbid obesity ([place]) 01/15/2015  . Lymphedema 06/19/2014  . Personal history of colonic polyps - large hyperplastic 12/25/2013  . Skin infection 10/14/2013  . Candidiasis of female genitalia 10/14/2013  . IFG (impaired fasting glucose) 10/14/2013  . Carpal tunnel syndrome 10/14/2013  . Cough 06/20/2013  . Allergic rhinitis, cause unspecified 06/20/2013  . Backache 06/05/2013  . Edema 12/20/2012  . Swelling of limb 11/30/2012  . Essential hypertension, benign 11/30/2012  . Headache(784.0) 11/30/2012  . Avascular necrosis of hip ([place]) 07/21/2012  . Atrial fibrillation ([place]) 07/06/2012  . Hypertension 09/14/2011  . Psoriasis  08/16/2005   Past Medical History:  Diagnosis Date  . Atrial fibrillation ([place])    a. s/p DCCV in 12/2012 b. repeat DCCV in 09/2014 - started on Flecainide  . Avascular necrosis of bone of right hip ([place])   . Hypertension   . Morbid obesity ([place])   . Osteoarthritis   . Personal history of colonic polyps - large hyperplastic 12/25/2013  . Psoriasis    active breakout left buttocks  . Right hip pain   . Shortness of breath   .  Tachycardia, unspecified     Family History  Problem Relation Age of Onset  . Hypertension Mother   . Diabetes Mother   . Alzheimer's disease Mother   . Thyroid disease Mother   . Diabetes Son     Past Surgical History:  Procedure Laterality Date  . CARDIOVERSION N/A 01/09/2013   Procedure: CARDIOVERSION;  Surgeon: [name], MD;  Location[facility] ENDOSCOPY;  Service: Cardiovascular;  Laterality: N/A;  . CARDIOVERSION N/A 10/07/2014   Procedure: CARDIOVERSION;  Surgeon: [name], MD;  Location: [facility] ENDOSCOPY;  Service: Cardiovascular;  Laterality: N/A;  09:00 Lido 60mg ,IV followed by Propofol  70mg /IV    synched electrocardioversion at 120 joules for Afib,repeated at 200 joules, 70 mg...successfully changed to SR  . FRACTURE SURGERY  07/21/12   right hip arthroplasty  . TOTAL HIP ARTHROPLASTY  07/21/2012   Procedure: TOTAL HIP ARTHROPLASTY;  Surgeon: [name], MD;  Location: [facility] ORS;  Service: Orthopedics;  Laterality: Right;   Social History   Occupational History  . Occupation: ED ADMISSIONS     Employer: [employer]    Comment: [employer]   Tobacco Use  . Smoking status: Former Smoker    Types: Cigarettes    Last attempt to quit: 09/13/1982    Years since quitting: 35.0  . Smokeless tobacco: Never Used  Substance and Sexual Activity  . Alcohol use: Yes    Alcohol/week: 0.0 oz    Comment: Occasionally  . Drug use: No  . Sexual activity: Yes    Partners: Male

## 2017-09-16 NOTE — Patient Instructions (Signed)
Methotrexate tablets What is this medicine? METHOTREXATE (METH oh TREX ate) is a chemotherapy drug used to treat cancer including breast cancer, leukemia, and lymphoma. This medicine can also be used to treat psoriasis and certain kinds of arthritis. This medicine may be used for other purposes; ask your health care provider or pharmacist if you have questions. COMMON BRAND NAME(S): Rheumatrex, Trexall What should I tell my health care provider before I take this medicine? They need to know if you have any of these conditions: -fluid in the stomach area or lungs -if you often drink alcohol -infection or immune system problems -kidney disease or on hemodialysis -liver disease -low blood counts, like low white cell, platelet, or red cell counts -lung disease -radiation therapy -stomach ulcers -ulcerative colitis -an unusual or allergic reaction to methotrexate, other medicines, foods, dyes, or preservatives -pregnant or trying to get pregnant -breast-feeding How should I use this medicine? Take this medicine by mouth with a glass of water. Follow the directions on the prescription label. Take your medicine at regular intervals. Do not take it more often than directed. Do not stop taking except on your doctor's advice. Make sure you know why you are taking this medicine and how often you should take it. If this medicine is used for a condition that is not cancer, like arthritis or psoriasis, it should be taken weekly, NOT daily. Taking this medicine more often than directed can cause serious side effects, even death. Talk to your healthcare provider about safe handling and disposal of this medicine. You may need to take special precautions. Talk to your pediatrician regarding the use of this medicine in children. While this drug may be prescribed for selected conditions, precautions do apply. Overdosage: If you think you have taken too much of this medicine contact a poison control center or  emergency room at once. NOTE: This medicine is only for you. Do not share this medicine with others. What if I miss a dose? If you miss a dose, talk with your doctor or health care professional. Do not take double or extra doses. What may interact with this medicine? This medicine may interact with the following medication: -acitretin -aspirin and aspirin-like medicines including salicylates -azathioprine -certain antibiotics like penicillins, tetracycline, and chloramphenicol -cyclosporine -gold -hydroxychloroquine -live virus vaccines -NSAIDs, medicines for pain and inflammation, like ibuprofen or naproxen -other cytotoxic agents -penicillamine -phenylbutazone -phenytoin -probenecid -retinoids such as isotretinoin and tretinoin -steroid medicines like prednisone or cortisone -sulfonamides like sulfasalazine and trimethoprim/sulfamethoxazole -theophylline This list may not describe all possible interactions. Give your health care provider a list of all the medicines, herbs, non-prescription drugs, or dietary supplements you use. Also tell them if you smoke, drink alcohol, or use illegal drugs. Some items may interact with your medicine. What should I watch for while using this medicine? Avoid alcoholic drinks. This medicine can make you more sensitive to the sun. Keep out of the sun. If you cannot avoid being in the sun, wear protective clothing and use sunscreen. Do not use sun lamps or tanning beds/booths. You may need blood work done while you are taking this medicine. Call your doctor or health care professional for advice if you get a fever, chills or sore throat, or other symptoms of a cold or flu. Do not treat yourself. This drug decreases your body's ability to fight infections. Try to avoid being around people who are sick. This medicine may increase your risk to bruise or bleed. Call your doctor or health care professional   if you notice any unusual bleeding. Check with your  doctor or health care professional if you get an attack of severe diarrhea, nausea and vomiting, or if you sweat a lot. The loss of too much body fluid can make it dangerous for you to take this medicine. Talk to your doctor about your risk of cancer. You may be more at risk for certain types of cancers if you take this medicine. Both men and women must use effective birth control with this medicine. Do not become pregnant while taking this medicine or until at least 1 normal menstrual cycle has occurred after stopping it. Women should inform their doctor if they wish to become pregnant or think they might be pregnant. Men should not father a child while taking this medicine and for 3 months after stopping it. There is a potential for serious side effects to an unborn child. Talk to your health care professional or pharmacist for more information. Do not breast-feed an infant while taking this medicine. What side effects may I notice from receiving this medicine? Side effects that you should report to your doctor or health care professional as soon as possible: -allergic reactions like skin rash, itching or hives, swelling of the face, lips, or tongue -breathing problems or shortness of breath -diarrhea -dry, nonproductive cough -low blood counts - this medicine may decrease the number of white blood cells, red blood cells and platelets. You may be at increased risk for infections and bleeding. -mouth sores -redness, blistering, peeling or loosening of the skin, including inside the mouth -signs of infection - fever or chills, cough, sore throat, pain or trouble passing urine -signs and symptoms of bleeding such as bloody or black, tarry stools; red or dark-brown urine; spitting up blood or brown material that looks like coffee grounds; red spots on the skin; unusual bruising or bleeding from the eye, gums, or nose -signs and symptoms of kidney injury like trouble passing urine or change in the amount  of urine -signs and symptoms of liver injury like dark yellow or brown urine; general ill feeling or flu-like symptoms; light-colored stools; loss of appetite; nausea; right upper belly pain; unusually weak or tired; yellowing of the eyes or skin Side effects that usually do not require medical attention (report to your doctor or health care professional if they continue or are bothersome): -dizziness -hair loss -tiredness -upset stomach -vomiting This list may not describe all possible side effects. Call your doctor for medical advice about side effects. You may report side effects to FDA at 1-800-FDA-1088. Where should I keep my medicine? Keep out of the reach of children. Store at room temperature between 20 and 25 degrees C (68 and 77 degrees F). Protect from light. Throw away any unused medicine after the expiration date. NOTE: This sheet is a summary. It may not cover all possible information. If you have questions about this medicine, talk to your doctor, pharmacist, or health care provider.  2018 Elsevier/Gold Standard (2015-04-07 05:39:22)  

## 2017-09-20 ENCOUNTER — Telehealth: Payer: Self-pay | Admitting: Rheumatology

## 2017-09-20 LAB — URINALYSIS, ROUTINE W REFLEX MICROSCOPIC
BACTERIA UA: NONE SEEN /HPF
Bilirubin Urine: NEGATIVE
Glucose, UA: NEGATIVE
Hgb urine dipstick: NEGATIVE
Hyaline Cast: NONE SEEN /LPF
KETONES UR: NEGATIVE
Nitrite: NEGATIVE
PH: 6.5 (ref 5.0–8.0)
Protein, ur: NEGATIVE
Specific Gravity, Urine: 1.016 (ref 1.001–1.03)

## 2017-09-20 LAB — TSH: TSH: 1.95 m[IU]/L (ref 0.40–4.50)

## 2017-09-20 LAB — CBC WITH DIFFERENTIAL/PLATELET
BASOS PCT: 0.4 %
Basophils Absolute: 21 cells/uL (ref 0–200)
Eosinophils Absolute: 201 cells/uL (ref 15–500)
Eosinophils Relative: 3.8 %
HCT: 33.4 % — ABNORMAL LOW (ref 35.0–45.0)
HEMOGLOBIN: 11.1 g/dL — AB (ref 11.7–15.5)
Lymphs Abs: 1993 cells/uL (ref 850–3900)
MCH: 28.1 pg (ref 27.0–33.0)
MCHC: 33.2 g/dL (ref 32.0–36.0)
MCV: 84.6 fL (ref 80.0–100.0)
MONOS PCT: 6.8 %
MPV: 9.9 fL (ref 7.5–12.5)
NEUTROS ABS: 2724 {cells}/uL (ref 1500–7800)
Neutrophils Relative %: 51.4 %
PLATELETS: 338 10*3/uL (ref 140–400)
RBC: 3.95 10*6/uL (ref 3.80–5.10)
RDW: 13.6 % (ref 11.0–15.0)
TOTAL LYMPHOCYTE: 37.6 %
WBC: 5.3 10*3/uL (ref 3.8–10.8)
WBCMIX: 360 {cells}/uL (ref 200–950)

## 2017-09-20 LAB — SJOGRENS SYNDROME-A EXTRACTABLE NUCLEAR ANTIBODY: SSA (RO) (ENA) ANTIBODY, IGG: NEGATIVE AI

## 2017-09-20 LAB — COMPLETE METABOLIC PANEL WITH GFR
AG Ratio: 1.2 (calc) (ref 1.0–2.5)
ALT: 14 U/L (ref 6–29)
AST: 10 U/L (ref 10–35)
Albumin: 4.1 g/dL (ref 3.6–5.1)
Alkaline phosphatase (APISO): 106 U/L (ref 33–130)
BUN: 12 mg/dL (ref 7–25)
CALCIUM: 9.5 mg/dL (ref 8.6–10.4)
CHLORIDE: 104 mmol/L (ref 98–110)
CO2: 31 mmol/L (ref 20–32)
Creat: 0.78 mg/dL (ref 0.50–1.05)
GFR, EST AFRICAN AMERICAN: 96 mL/min/{1.73_m2} (ref >=60)
GFR, EST NON AFRICAN AMERICAN: 83 mL/min/{1.73_m2} (ref >=60)
GLUCOSE: 83 mg/dL (ref 65–99)
Globulin: 3.3 g/dL (calc) (ref 1.9–3.7)
Potassium: 3.6 mmol/L (ref 3.5–5.3)
Sodium: 140 mmol/L (ref 135–146)
TOTAL PROTEIN: 7.4 g/dL (ref 6.1–8.1)
Total Bilirubin: 0.5 mg/dL (ref 0.2–1.2)

## 2017-09-20 LAB — C3 AND C4
C3 COMPLEMENT: 181 mg/dL (ref 83–193)
C4 Complement: 33 mg/dL (ref 15–57)

## 2017-09-20 LAB — HEPATITIS B CORE ANTIBODY, IGM: HEP B C IGM: NONREACTIVE

## 2017-09-20 LAB — PROTEIN ELECTROPHORESIS, SERUM, WITH REFLEX
ALBUMIN ELP: 3.9 g/dL (ref 3.8–4.8)
ALPHA 1: 0.3 g/dL (ref 0.2–0.3)
Alpha 2: 0.8 g/dL (ref 0.5–0.9)
BETA 2: 0.4 g/dL (ref 0.2–0.5)
Beta Globulin: 0.5 g/dL (ref 0.4–0.6)
GAMMA GLOBULIN: 1.7 g/dL (ref 0.8–1.7)
Total Protein: 7.6 g/dL (ref 6.1–8.1)

## 2017-09-20 LAB — HEPATITIS C ANTIBODY
HEP C AB: NONREACTIVE
SIGNAL TO CUT-OFF: 0.05 (ref <1.00)

## 2017-09-20 LAB — IGG, IGA, IGM
IgG (Immunoglobin G), Serum: 1767 mg/dL — ABNORMAL HIGH (ref 694–1618)
IgM, Serum: 111 mg/dL (ref 48–271)
Immunoglobulin A: 242 mg/dL (ref 81–463)

## 2017-09-20 LAB — ANTI-SCLERODERMA ANTIBODY: SCLERODERMA (SCL-70) (ENA) ANTIBODY, IGG: NEGATIVE AI

## 2017-09-20 LAB — HEPATITIS B SURFACE ANTIGEN: Hepatitis B Surface Ag: NONREACTIVE

## 2017-09-20 LAB — RNP ANTIBODY: RIBONUCLEIC PROTEIN(ENA) ANTIBODY, IGG: NEGATIVE AI

## 2017-09-20 LAB — CK: Total CK: 78 U/L (ref 29–143)

## 2017-09-20 LAB — CYCLIC CITRUL PEPTIDE ANTIBODY, IGG: Cyclic Citrullin Peptide Ab: <16 UNITS

## 2017-09-20 LAB — ANTI-DNA ANTIBODY, DOUBLE-STRANDED: ds DNA Ab: <1 IU/mL

## 2017-09-20 LAB — SJOGRENS SYNDROME-B EXTRACTABLE NUCLEAR ANTIBODY: SSB (LA) (ENA) ANTIBODY, IGG: NEGATIVE AI

## 2017-09-20 LAB — ANTI-SMITH ANTIBODY: ENA SM Ab Ser-aCnc: <1 AI

## 2017-09-20 LAB — HIV ANTIBODY (ROUTINE TESTING W REFLEX): HIV: NONREACTIVE

## 2017-09-20 MED ORDER — METHOTREXATE 2.5 MG PO TABS: ORAL_TABLET | ORAL | 0 refills | Status: DC

## 2017-09-20 MED ORDER — FOLIC ACID 1 MG PO TABS: 2.0000 mg | ORAL_TABLET | Freq: Every day | ORAL | 0 refills | Status: DC

## 2017-09-20 MED FILL — FOLIC ACID 1 MG TABLET: 1 | 90 days supply | Qty: 180 | Fill #0

## 2017-09-20 MED FILL — METHOTREXATE 2.5 MG TABLET: 2.5 | 28 days supply | Qty: 28 | Fill #0

## 2017-09-20 NOTE — Telephone Encounter (Signed)
Methotrexate and folic acid prescriptions have been sent to the pharmacy. Patient is aware.

## 2017-09-20 NOTE — Telephone Encounter (Signed)
Left message on machine for patient to call the office.

## 2017-09-20 NOTE — Addendum Note (Signed)
Addended by: Earnestine Mealing on: 09/20/2017 04:52 PM   Modules accepted: Orders

## 2017-09-20 NOTE — Telephone Encounter (Signed)
Patient called to check on the prescription of Methotrexate that Dr. Estanislado Pandy was going to call into the pharmacy.  Patient uses [facility].

## 2017-09-26 MED FILL — COSENTYX 300 MG DOSE-2 PENS: 150 | 28 days supply | Qty: 2 | Fill #1

## 2017-09-30 MED FILL — TRULICITY 1.5 MG/0.5 ML PEN: 1.5 | 28 days supply | Qty: 2 | Fill #2

## 2017-09-30 MED FILL — POTASSIUM CL 10 MEQ TAB SA: 10 | 90 days supply | Qty: 90 | Fill #0

## 2017-10-05 ENCOUNTER — Encounter (HOSPITAL_BASED_OUTPATIENT_CLINIC_OR_DEPARTMENT_OTHER): Payer: Self-pay | Admitting: Emergency Medicine

## 2017-10-05 ENCOUNTER — Other Ambulatory Visit: Payer: Self-pay

## 2017-10-05 ENCOUNTER — Emergency Department (HOSPITAL_BASED_OUTPATIENT_CLINIC_OR_DEPARTMENT_OTHER)
Admission: EM | Admit: 2017-10-05 | Discharge: 2017-10-05 | Disposition: A | Discharge status: Home / Self Care | Payer: 59 | Attending: Emergency Medicine | Admitting: Emergency Medicine

## 2017-10-05 ENCOUNTER — Emergency Department (HOSPITAL_BASED_OUTPATIENT_CLINIC_OR_DEPARTMENT_OTHER): Payer: 59

## 2017-10-05 DIAGNOSIS — Z79899 Other long term (current) drug therapy: Secondary | ICD-10-CM | POA: Diagnosis not present

## 2017-10-05 DIAGNOSIS — S299XXA Unspecified injury of thorax, initial encounter: Secondary | ICD-10-CM | POA: Diagnosis present

## 2017-10-05 DIAGNOSIS — Z87891 Personal history of nicotine dependence: Secondary | ICD-10-CM | POA: Diagnosis not present

## 2017-10-05 DIAGNOSIS — Y998 Other external cause status: Secondary | ICD-10-CM | POA: Diagnosis not present

## 2017-10-05 DIAGNOSIS — S2231XA Fracture of one rib, right side, initial encounter for closed fracture: Secondary | ICD-10-CM | POA: Diagnosis not present

## 2017-10-05 DIAGNOSIS — I10 Essential (primary) hypertension: Secondary | ICD-10-CM | POA: Insufficient documentation

## 2017-10-05 DIAGNOSIS — Y92410 Unspecified street and highway as the place of occurrence of the external cause: Secondary | ICD-10-CM | POA: Diagnosis not present

## 2017-10-05 DIAGNOSIS — R51 Headache: Secondary | ICD-10-CM | POA: Diagnosis not present

## 2017-10-05 DIAGNOSIS — M542 Cervicalgia: Secondary | ICD-10-CM | POA: Diagnosis not present

## 2017-10-05 DIAGNOSIS — I4891 Unspecified atrial fibrillation: Secondary | ICD-10-CM | POA: Diagnosis not present

## 2017-10-05 DIAGNOSIS — Y939 Activity, unspecified: Secondary | ICD-10-CM | POA: Insufficient documentation

## 2017-10-05 MED ORDER — HYDROCODONE-ACETAMINOPHEN 5-325 MG PO TABS: 1.0000 | ORAL_TABLET | ORAL | 0 refills | Status: DC | PRN

## 2017-10-05 MED FILL — HYDROCODON-APAP 5-325: 5-325 | 2 days supply | Qty: 20 | Fill #0
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
## 2017-10-06 ENCOUNTER — Other Ambulatory Visit: Payer: Self-pay | Admitting: *Deleted

## 2017-10-06 DIAGNOSIS — Z79899 Other long term (current) drug therapy: Secondary | ICD-10-CM | POA: Diagnosis not present

## 2017-10-06 LAB — CBC WITH DIFFERENTIAL/PLATELET
BASOS PCT: 0.4 %
Basophils Absolute: 22 cells/uL (ref 0–200)
EOS ABS: 149 {cells}/uL (ref 15–500)
Eosinophils Relative: 2.7 %
HCT: 33.1 % — ABNORMAL LOW (ref 35.0–45.0)
Hemoglobin: 11.1 g/dL — ABNORMAL LOW (ref 11.7–15.5)
Lymphs Abs: 1975 cells/uL (ref 850–3900)
MCH: 28.5 pg (ref 27.0–33.0)
MCHC: 33.5 g/dL (ref 32.0–36.0)
MCV: 84.9 fL (ref 80.0–100.0)
MPV: 9.8 fL (ref 7.5–12.5)
Monocytes Relative: 7.1 %
Neutro Abs: 2965 cells/uL (ref 1500–7800)
Neutrophils Relative %: 53.9 %
Platelets: 341 10*3/uL (ref 140–400)
RBC: 3.9 10*6/uL (ref 3.80–5.10)
RDW: 14 % (ref 11.0–15.0)
Total Lymphocyte: 35.9 %
WBC: 5.5 10*3/uL (ref 3.8–10.8)
WBCMIX: 391 {cells}/uL (ref 200–950)

## 2017-10-06 LAB — COMPLETE METABOLIC PANEL WITH GFR
AG Ratio: 1.1 (calc) (ref 1.0–2.5)
ALT: 15 U/L (ref 6–29)
AST: 9 U/L — AB (ref 10–35)
Albumin: 3.8 g/dL (ref 3.6–5.1)
Alkaline phosphatase (APISO): 106 U/L (ref 33–130)
BUN: 11 mg/dL (ref 7–25)
CO2: 31 mmol/L (ref 20–32)
Calcium: 9.2 mg/dL (ref 8.6–10.4)
Chloride: 102 mmol/L (ref 98–110)
Creat: 0.65 mg/dL (ref 0.50–1.05)
GFR, EST NON AFRICAN AMERICAN: 97 mL/min/{1.73_m2} (ref >=60)
GFR, Est African American: 113 mL/min/{1.73_m2} (ref >=60)
GLOBULIN: 3.5 g/dL (ref 1.9–3.7)
Glucose, Bld: 89 mg/dL (ref 65–99)
Potassium: 3.5 mmol/L (ref 3.5–5.3)
SODIUM: 140 mmol/L (ref 135–146)
TOTAL PROTEIN: 7.3 g/dL (ref 6.1–8.1)
Total Bilirubin: 0.6 mg/dL (ref 0.2–1.2)

## 2017-10-12 MED FILL — FLECAINIDE ACETATE 50 MG TA: 50 | 90 days supply | Qty: 180 | Fill #3

## 2017-10-12 MED FILL — XARELTO 20 MG TABLET: 20 | 30 days supply | Qty: 30 | Fill #9

## 2017-10-20 ENCOUNTER — Other Ambulatory Visit: Payer: Self-pay | Admitting: *Deleted

## 2017-10-20 ENCOUNTER — Other Ambulatory Visit: Payer: Self-pay

## 2017-10-20 DIAGNOSIS — Z79899 Other long term (current) drug therapy: Secondary | ICD-10-CM | POA: Diagnosis not present

## 2017-10-20 LAB — COMPLETE METABOLIC PANEL WITH GFR
AG Ratio: 1.3 (calc) (ref 1.0–2.5)
ALBUMIN MSPROF: 4.1 g/dL (ref 3.6–5.1)
ALKALINE PHOSPHATASE (APISO): 95 U/L (ref 33–130)
ALT: 14 U/L (ref 6–29)
AST: 11 U/L (ref 10–35)
BUN: 9 mg/dL (ref 7–25)
CO2: 30 mmol/L (ref 20–32)
CREATININE: 0.8 mg/dL (ref 0.50–1.05)
Calcium: 9.2 mg/dL (ref 8.6–10.4)
Chloride: 103 mmol/L (ref 98–110)
GFR, EST AFRICAN AMERICAN: 94 mL/min/{1.73_m2} (ref >=60)
GFR, Est Non African American: 81 mL/min/{1.73_m2} (ref >=60)
GLOBULIN: 3.1 g/dL (ref 1.9–3.7)
Glucose, Bld: 112 mg/dL — ABNORMAL HIGH (ref 65–99)
Potassium: 3.5 mmol/L (ref 3.5–5.3)
SODIUM: 141 mmol/L (ref 135–146)
TOTAL PROTEIN: 7.2 g/dL (ref 6.1–8.1)
Total Bilirubin: 0.6 mg/dL (ref 0.2–1.2)

## 2017-10-20 LAB — CBC WITH DIFFERENTIAL/PLATELET
BASOS ABS: 10 {cells}/uL (ref 0–200)
Basophils Relative: 0.2 %
EOS ABS: 158 {cells}/uL (ref 15–500)
Eosinophils Relative: 3.1 %
HEMATOCRIT: 33.4 % — AB (ref 35.0–45.0)
Hemoglobin: 11.2 g/dL — ABNORMAL LOW (ref 11.7–15.5)
Lymphs Abs: 2086 cells/uL (ref 850–3900)
MCH: 28.7 pg (ref 27.0–33.0)
MCHC: 33.5 g/dL (ref 32.0–36.0)
MCV: 85.6 fL (ref 80.0–100.0)
MPV: 9.5 fL (ref 7.5–12.5)
Monocytes Relative: 7.1 %
Neutro Abs: 2484 cells/uL (ref 1500–7800)
Neutrophils Relative %: 48.7 %
PLATELETS: 352 10*3/uL (ref 140–400)
RBC: 3.9 10*6/uL (ref 3.80–5.10)
RDW: 14.1 % (ref 11.0–15.0)
TOTAL LYMPHOCYTE: 40.9 %
WBC: 5.1 10*3/uL (ref 3.8–10.8)
WBCMIX: 362 {cells}/uL (ref 200–950)

## 2017-10-20 MED FILL — COSENTYX 300 MG DOSE-2 PENS: 150 | 28 days supply | Qty: 2 | Fill #2

## 2017-10-20 MED FILL — BYSTOLIC 10 MG TABLET: 10 | 30 days supply | Qty: 30 | Fill #0

## 2017-10-21 ENCOUNTER — Other Ambulatory Visit: Payer: Self-pay | Admitting: *Deleted

## 2017-10-21 ENCOUNTER — Ambulatory Visit: Payer: 59 | Admitting: Rheumatology

## 2017-10-21 MED ORDER — METHOTREXATE 2.5 MG PO TABS: 20.0000 mg | ORAL_TABLET | ORAL | 0 refills | Status: DC

## 2017-10-21 MED FILL — METHOTREXATE SODIUM 2.5 MG: 2.5 | 84 days supply | Qty: 96 | Fill #0

## 2017-10-21 NOTE — Telephone Encounter (Signed)
Last Visit: 09/16/17 Next Visit: 11/24/17 Labs: 10/20/17 stable  Okay to refill per Dr. Estanislado Pandy

## 2017-11-04 DIAGNOSIS — G8929 Other chronic pain: Secondary | ICD-10-CM | POA: Diagnosis not present

## 2017-11-04 DIAGNOSIS — M25561 Pain in right knee: Secondary | ICD-10-CM | POA: Diagnosis not present

## 2017-11-04 DIAGNOSIS — L309 Dermatitis, unspecified: Secondary | ICD-10-CM | POA: Diagnosis not present

## 2017-11-04 MED FILL — DESONIDE 0.05% CREAM: 0.05 | 30 days supply | Qty: 60 | Fill #0

## 2017-11-04 MED FILL — TRULICITY 1.5 MG/0.5 ML PEN: 1.5 | 28 days supply | Qty: 2 | Fill #3

## 2017-11-10 MED FILL — XARELTO 20 MG TABLET: 20 | 30 days supply | Qty: 30 | Fill #10

## 2017-11-17 ENCOUNTER — Other Ambulatory Visit: Payer: Self-pay | Admitting: Pharmacist

## 2017-11-17 ENCOUNTER — Ambulatory Visit: Payer: 59 | Admitting: Family Medicine

## 2017-11-17 DIAGNOSIS — L4050 Arthropathic psoriasis, unspecified: Secondary | ICD-10-CM | POA: Insufficient documentation

## 2017-11-17 DIAGNOSIS — Z96641 Presence of right artificial hip joint: Secondary | ICD-10-CM | POA: Insufficient documentation

## 2017-11-17 HISTORY — DX: Presence of right artificial hip joint: Z96.641

## 2017-11-17 MED ORDER — SECUKINUMAB 150 MG/ML ~~LOC~~ SOAJ: 2.0000 mL | SUBCUTANEOUS | 2 refills | Status: DC

## 2017-11-17 NOTE — Progress Notes (Signed)
Office Visit Note  Patient: Monica [name]             Date of Birth: 1956-03-30           MRN: [id number]             PCP: [name], MD Referring: [name], MD Visit Date: 11/24/2017 Occupation: @GUAROCC @    Subjective:  Left hip and right knee pain.   History of Present Illness: Monica [name] is a 62 y.o. female with history of psoriatic arthritis, psoriasis and osteoarthritis.  She was started on methotrexate after her last visit.  She is currently on combination of Cosyntex and methotrexate.  She states adding methotrexate cleared up her psoriasis.  She is not having any joint swelling.  She continues to have pain and discomfort in her right knee and left hip.  Her right total knee replacement is doing well.  Activities of Daily Living:  Patient reports morning stiffness for 20 minutes.   Patient Reports nocturnal pain.  Difficulty dressing/grooming: Denies Difficulty climbing stairs: Reports Difficulty getting out of chair: Reports Difficulty using hands for taps, buttons, cutlery, and/or writing: Denies   Review of Systems  Constitutional: Positive for fatigue. Negative for activity change, night sweats, weight gain and weight loss.  HENT: Positive for mouth dryness. Negative for mouth sores, trouble swallowing, trouble swallowing and nose dryness.   Eyes: Negative for pain, redness, visual disturbance and dryness.  Respiratory: Negative for cough, shortness of breath and difficulty breathing.   Cardiovascular: Negative for chest pain, palpitations, hypertension, irregular heartbeat and swelling in legs/feet.  Gastrointestinal: Negative for blood in stool, constipation and diarrhea.  Endocrine: Negative for cold intolerance and increased urination.  Genitourinary: Negative for difficulty urinating and vaginal dryness.  Musculoskeletal: Positive for arthralgias, joint pain and morning stiffness. Negative for gait problem, joint swelling, myalgias, muscle  weakness, muscle tenderness and myalgias.  Skin: Negative for color change, rash, hair loss, skin tightness, ulcers and sensitivity to sunlight.  Allergic/Immunologic: Negative for susceptible to infections.  Neurological: Negative for dizziness, memory loss, night sweats and weakness.  Hematological: Positive for bruising/bleeding tendency. Negative for swollen glands.  Psychiatric/Behavioral: Negative for depressed mood and sleep disturbance. The patient is not nervous/anxious.     PMFS History:  Patient Active Problem List   Diagnosis Date Noted  . Psoriatic arthropathy ([place]) 11/17/2017  . History of total hip replacement, right 11/17/2017  . Chronic anticoagulation 11/19/2016  . Tinnitus 11/19/2016  . Right knee pain 07/06/2016  . Left hip pain 10/21/2015  . Gastroesophageal reflux disease 09/19/2015  . Itchy eyes 03/12/2015  . Allergic conjunctivitis 03/12/2015  . OSA (obstructive sleep apnea) 02/20/2015  . Morbid obesity ([place]) 01/15/2015  . Lymphedema 06/19/2014  . Personal history of colonic polyps - large hyperplastic 12/25/2013  . Skin infection 10/14/2013  . Candidiasis of female genitalia 10/14/2013  . IFG (impaired fasting glucose) 10/14/2013  . Carpal tunnel syndrome 10/14/2013  . Cough 06/20/2013  . Allergic rhinitis, cause unspecified 06/20/2013  . Backache 06/05/2013  . Edema 12/20/2012  . Swelling of limb 11/30/2012  . Essential hypertension, benign 11/30/2012  . Headache(784.0) 11/30/2012  . Avascular necrosis of hip ([place]) 07/21/2012  . Atrial fibrillation ([place]) 07/06/2012  . Hypertension 09/14/2011  . Psoriasis 08/16/2005    Past Medical History:  Diagnosis Date  . Atrial fibrillation ([place])    a. s/p DCCV in 12/2012 b. repeat DCCV in 09/2014 - started on Flecainide  . Avascular necrosis of  bone of right hip ([place])   . Hypertension   . Morbid obesity ([place])   . Osteoarthritis   . Personal history of colonic polyps - large hyperplastic 12/25/2013  .  Psoriasis    active breakout left buttocks  . Right hip pain   . Shortness of breath   . Tachycardia, unspecified     Family History  Problem Relation Age of Onset  . Hypertension Mother   . Diabetes Mother   . Alzheimer's disease Mother   . Thyroid disease Mother   . Diabetes Son    Past Surgical History:  Procedure Laterality Date  . CARDIOVERSION N/A 01/09/2013   Procedure: CARDIOVERSION;  Surgeon: [name], MD;  Location: [facility] ENDOSCOPY;  Service: Cardiovascular;  Laterality: N/A;  . CARDIOVERSION N/A 10/07/2014   Procedure: CARDIOVERSION;  Surgeon: [name], MD;  Location: [facility] ENDOSCOPY;  Service: Cardiovascular;  Laterality: N/A;  09:00 Lido 50m,IV followed by Propofol  736mIV    synched electrocardioversion at 120 joules for Afib,repeated at 200 joules, 70 mg...successfully changed to SR  . FRACTURE SURGERY  07/21/12   right hip arthroplasty  . 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    Education: Field seismologist  Tobacco Use/Exposure: Former Smoker.  She used to smoke 4 cigarettes per day for five years and quit 32 years ago.    Alcohol Use:  Occasional   Drug Use:  None   Diet:  Regular   Exercise:  Water Aerobics (2 x per week)     Hobbies: Shopping, Cycling               Objective: Vital Signs: BP 121/68 (BP Location: Left Arm, Patient Position: Sitting, Cuff Size: Normal)   Pulse 74   Resp 20   Ht 5' 5"  (1.651 m)   Wt 250 lb (113.4 kg)   LMP 06/19/2013   BMI 41.60 kg/m    Physical Exam  Constitutional: She is oriented to person, place, and time. She appears well-developed  and well-nourished.  HENT:  Head: Normocephalic and atraumatic.  Eyes: Conjunctivae and EOM are normal.  Neck: Normal range of motion.  Cardiovascular: Normal rate, regular rhythm, normal heart sounds and intact distal pulses.  Pulmonary/Chest: Effort normal and breath sounds normal.  Abdominal: Soft. Bowel sounds are normal.  Lymphadenopathy:    She has no cervical adenopathy.  Neurological: She is alert and oriented to person, place, and time.  Skin: Skin is warm and dry. Capillary refill takes less than 2 seconds.  Psychiatric: She has a normal mood and affect. Her behavior is normal.  Nursing note and vitals reviewed.    Musculoskeletal Exam: C-spine good range of motion.  She has discomfort range of motion of her lumbar spine.  She had good range of motion of her shoulders, elbow joints, wrist joints, MCPs, PIPs and DIPs with no synovitis.  She has limited painful range of motion of her left hip joint.  She has some warmth on palpation of her right knee joint.  Her right total hip replacement is doing well.  She has  no swelling over ankle joints.  There is no tenderness across the MTPs or PIPs of her feet.  CDAI Exam: CDAI Homunculus Exam:   Joint Counts:  CDAI Tender Joint count: 0 CDAI Swollen Joint count: 0  Global Assessments:  Patient Global Assessment: 2 Provider Global Assessment: 2  CDAI Calculated Score: 4    Investigation: Findings:  06/10/17: ANA 1:640 speckled, RF -, ESR 52, uric acid 7.1, PPD negative July 12, 2012 chest x-ray normal    CBC Latest Ref Rng & Units 10/20/2017 10/06/2017 09/16/2017  WBC 3.8 - 10.8 Thousand/uL 5.1 5.5 5.3  Hemoglobin 11.7 - 15.5 g/dL 11.2(L) 11.1(L) 11.1(L)  Hematocrit 35.0 - 45.0 % 33.4(L) 33.1(L) 33.4(L)  Platelets 140 - 400 Thousand/uL 352 341 338   CMP normal 09/16/18 UA negative,SPEP negative, IgG elevated,HIV neg,CK normal, TSH normal, anti-CCP negative, ENA negative  Imaging: No results found.  Speciality  Comments: No specialty comments available.    Procedures:  No procedures performed Allergies: Patient has no known allergies.   Assessment / Plan:     Visit Diagnoses: Psoriatic arthropathy ([place]) - +synovitis, ESR 52, ANA 1: 640NS.  Patient has responded very well to Cosyntex and methotrexate combination.  She had no synovitis on examination.  No synovial thickening was noted.  Her labs have been stable.  Psoriasis - x10 years. She is on Cosyntex by her dermatologist.MTX 8 tabs po q wk. Folic acid 2 mg po qd.  Her psoriasis has resolved on methotrexate and Cosyntex combination.  High risk medication use: Her last labs in March were normal.  We will continue to monitor labs every 3 months.  She has been experiencing some nausea with methotrexate.  I will call in Zofran for her.  History of total hip replacement, right - History of AVN: Doing well  Left hip pain: Patient plans to discuss that with Dr. Barbaraann Barthel today.  Primary osteoarthritis of both knees: She does have moderate osteoarthritis in her knee joints.  She has been having increased pain and discomfort in her right knee joint.  She has appointment with Dr. Barbaraann Barthel today where she plans to get injection.  Essential hypertension: Her blood pressure is well controlled.  Gastroesophageal reflux disease without esophagitis  Chronic anticoagulation - She is on Xarelto  Paroxysmal atrial fibrillation (HCC)  OSA (obstructive sleep apnea)  Morbid obesity ([place]): Weight loss diet and exercise was discussed.  Increase association of heart disease and psoriatic arthritis patients was discussed.   Orders: No orders of the defined types were placed in this encounter.  Meds ordered this encounter  Medications  . ondansetron (ZOFRAN) 4 MG tablet    Sig: Take 1 tablet (4 mg total) by mouth every 8 (eight) hours as needed for nausea or vomiting.    Dispense:  30 tablet    Refill:  1    Face-to-face time spent with patient was 30  minutes.  Greater than 50% of time was spent in counseling and coordination of care.  Follow-Up Instructions: Return for Psoriatic arthritis, Osteoarthritis.   Bo Merino, MD  Note - This record has been created using Editor, commissioning.  Chart creation errors have been sought, but may not always  have been located. Such creation errors do not reflect on  the standard of medical care.

## 2017-11-18 ENCOUNTER — Ambulatory Visit: Payer: 59 | Admitting: Family Medicine

## 2017-11-23 MED FILL — COSENTYX 300 MG DOSE-2 PENS: 150 | 28 days supply | Qty: 2 | Fill #0

## 2017-11-24 ENCOUNTER — Ambulatory Visit: Payer: 59 | Admitting: Rheumatology

## 2017-11-24 ENCOUNTER — Encounter: Payer: Self-pay | Admitting: Rheumatology

## 2017-11-24 ENCOUNTER — Ambulatory Visit (INDEPENDENT_AMBULATORY_CARE_PROVIDER_SITE_OTHER): Payer: 59 | Admitting: Family Medicine

## 2017-11-24 ENCOUNTER — Encounter: Payer: Self-pay | Admitting: Family Medicine

## 2017-11-24 VITALS — BP 121/68 | HR 74 | Resp 20 | Ht 65.0 in | Wt 250.0 lb

## 2017-11-24 DIAGNOSIS — I480 Paroxysmal atrial fibrillation: Secondary | ICD-10-CM

## 2017-11-24 DIAGNOSIS — M25552 Pain in left hip: Secondary | ICD-10-CM

## 2017-11-24 DIAGNOSIS — L4050 Arthropathic psoriasis, unspecified: Secondary | ICD-10-CM | POA: Diagnosis not present

## 2017-11-24 DIAGNOSIS — Z7901 Long term (current) use of anticoagulants: Secondary | ICD-10-CM | POA: Diagnosis not present

## 2017-11-24 DIAGNOSIS — K219 Gastro-esophageal reflux disease without esophagitis: Secondary | ICD-10-CM | POA: Diagnosis not present

## 2017-11-24 DIAGNOSIS — M542 Cervicalgia: Secondary | ICD-10-CM | POA: Diagnosis not present

## 2017-11-24 DIAGNOSIS — I10 Essential (primary) hypertension: Secondary | ICD-10-CM

## 2017-11-24 DIAGNOSIS — Z96641 Presence of right artificial hip joint: Secondary | ICD-10-CM

## 2017-11-24 DIAGNOSIS — M25561 Pain in right knee: Secondary | ICD-10-CM | POA: Diagnosis not present

## 2017-11-24 DIAGNOSIS — Z79899 Other long term (current) drug therapy: Secondary | ICD-10-CM

## 2017-11-24 DIAGNOSIS — L409 Psoriasis, unspecified: Secondary | ICD-10-CM

## 2017-11-24 DIAGNOSIS — M170 Bilateral primary osteoarthritis of knee: Secondary | ICD-10-CM

## 2017-11-24 DIAGNOSIS — G4733 Obstructive sleep apnea (adult) (pediatric): Secondary | ICD-10-CM

## 2017-11-24 MED ORDER — METHYLPREDNISOLONE ACETATE 40 MG/ML IJ SUSP
40.0000 mg | Freq: Once | INTRAMUSCULAR | Status: AC
  Administered 2017-11-24: 40 mg via INTRA_ARTICULAR

## 2017-11-24 MED ORDER — ONDANSETRON HCL 4 MG PO TABS: 4.0000 mg | ORAL_TABLET | Freq: Three times a day (TID) | ORAL | 1 refills | Status: DC | PRN

## 2017-11-24 MED FILL — ONDANSETRON HCL 4 MG TABLET: 4 | 10 days supply | Qty: 30 | Fill #0

## 2017-11-24 NOTE — Patient Instructions (Signed)
Standing Labs We placed an order today for your standing lab work.    Please come back and get your standing labs in June and every 3 months  We have open lab Monday through Friday from 8:30-11:30 AM and 1:30-4:00 PM  at the office of Dr. Kaled Allende.   You may experience shorter wait times on Monday and Friday afternoons. The office is located at [address], [address], [place], [place] [address] No appointment is necessary.   Labs are drawn by [facility].  You may receive a bill from [facility] for your lab work. If you have any questions regarding directions or hours of operation,  please call [phone].    

## 2017-11-24 NOTE — Patient Instructions (Signed)
Your knee pain is due to arthritis. These are the different medications you can take for this: Tylenol 500mg  1-2 tabs three times a day for pain. Capsaicin, aspercreme, or biofreeze topically up to four times a day may also help with pain. Some supplements that may help for arthritis: Boswellia extract, curcumin, pycnogenol Aleve 1-2 tabs twice a day with food Cortisone injections are an option - you were given this today If cortisone injections do not help, there are different types of shots that may help but they take longer to take effect. It's important that you continue to stay active. Straight leg raises, knee extensions 3 sets of 10 once a day (add ankle weight if these become too easy). Consider physical therapy to strengthen muscles around the joint that hurts to take pressure off of the joint itself. Shoe inserts with good arch support may be helpful. Heat or ice 15 minutes at a time 3-4 times a day as needed to help with pain. Water aerobics and cycling with low resistance are the best two types of exercise for arthritis though any exercise is ok as long as it doesn't worsen the pain. Follow up with me in 1 month.  Your hip pain is due to arthritis and bursitis - you were given a bursa injection today. Avoid painful activities as much as possible. Ice over area of pain 3-4 times a day for 15 minutes at a time Hip side raise exercise 3 sets of 10 once a day - add weights if this becomes too easy. Continue tylenol as needed. If not improving, can consider physical therapy and/or steroid injection into the hip joint. Follow up with me in 1 month.

## 2017-11-28 ENCOUNTER — Encounter: Payer: Self-pay | Admitting: Family Medicine

## 2017-11-28 NOTE — Progress Notes (Signed)
PCP: [name], MD  Subjective:   HPI: Patient is a 62 y.o. female here for left hip pain, right knee pain.  10/16/15: Patient denies known injury or trauma. She reports 4/10 level of pain lateral left hip. Worse lying on this side, sharp. No numbness or tingling. Using pain medication which helps temporarily. No bowel/bladder dysfunction.  02/17/17: Patient reports she has had recurrence of left hip pain. Pain level 3/10 at rest but up to 8/10 and sharp with walking. Pain is lateral and into hip, down knee. Using voltaren gel which helps. Planning to go to [place] in a couple days and will do a lot of walking. No skin changes, numbness. No bowel/bladder dysfunction.  11/24/17: Patient reports that she is struggling currently with left hip pain anteriorly and laterally up to 10 out of 10 level. Pain is also worse with walking has some mild feeling of warmth. She also has some anterior right knee pain with some associated swelling and also feeling of warmth here. Not currently taking anything for these. No skin changes or numbness.  Past Medical History:  Diagnosis Date  . Atrial fibrillation ([place])    a. s/p DCCV in 12/2012 b. repeat DCCV in 09/2014 - started on Flecainide  . Avascular necrosis of bone of right hip ([place])   . Hypertension   . Morbid obesity ([place])   . Osteoarthritis   . Personal history of colonic polyps - large hyperplastic 12/25/2013  . Psoriasis    active breakout left buttocks  . Right hip pain   . Shortness of breath   . Tachycardia, unspecified     Current Outpatient Medications on File Prior to Visit  Medication Sig Dispense Refill  . BYSTOLIC 10 MG tablet Take 1 tablet by mouth daily.  3  . Cholecalciferol (VITAMIN D PO) Take by mouth.    . desonide (DESOWEN) 0.05 % cream   0  . diclofenac sodium (VOLTAREN) 1 % GEL Apply 4 g topically 4 (four) times daily. (Patient taking differently: Apply 4 g topically 4 (four) times daily as needed. ) 5  Tube 1  . flecainide (TAMBOCOR) 50 MG tablet TAKE 1 TABLET (50 MG TOTAL) BY MOUTH 2 (TWO) TIMES DAILY. 180 tablet 3  . fluticasone (FLONASE) 50 MCG/ACT nasal spray Place 2 sprays into both nostrils as needed. 16 g 0  . folic acid (FOLVITE) 1 MG tablet Take 2 tablets (2 mg total) by mouth daily. 180 tablet 0  . hydrochlorothiazide (HYDRODIURIL) 25 MG tablet   3  . HYDROcodone-acetaminophen (NORCO/VICODIN) 5-325 MG tablet Take 1-2 tablets by mouth every 4 (four) hours as needed for moderate pain. 20 tablet 0  . methotrexate (RHEUMATREX) 2.5 MG tablet Take 8 tablets (20 mg total) by mouth once a week. 96 tablet 0  . Multiple Vitamins-Minerals (MULTIVITAMIN WITH MINERALS) tablet Take 1 tablet by mouth daily with supper.    . nebivolol (BYSTOLIC) 5 MG tablet Take 5 mg by mouth daily.    . Olopatadine HCl 0.2 % SOLN Apply 1 drop to eye daily. (Patient not taking: Reported on 09/16/2017) 5 mL 0  . ondansetron (ZOFRAN) 4 MG tablet Take 1 tablet (4 mg total) by mouth every 8 (eight) hours as needed for nausea or vomiting. 30 tablet 1  . potassium chloride (K-DUR,KLOR-CON) 10 MEQ tablet Take 1 tablet (10 mEq total) by mouth 2 (two) times daily. (Patient taking differently: Take 10 mEq by mouth daily. ) 180 tablet 3  . ranitidine (ZANTAC) 300 MG  tablet Take by mouth.    . Secukinumab (COSENTYX SENSOREADY 300 DOSE) 150 MG/ML SOAJ Inject 2 mLs into the skin as directed. Inject 2 pens (2 ml) every 4 weeks 2 pen 2  . sertraline (ZOLOFT) 50 MG tablet   1  . triamterene-hydrochlorothiazide (MAXZIDE-25) 37.5-25 MG tablet Take by mouth.    . TRULICITY 1.5 BB/0.4UG SOPN   5  . Vitamin D, Ergocalciferol, (DRISDOL) 50000 UNITS CAPS capsule Take 1 capsule (50,000 Units total) by mouth 2 (two) times a week. (Patient not taking: Reported on 11/24/2017) 8 capsule 11  . XARELTO 20 MG TABS tablet TAKE 1 TABLET BY MOUTH DAILY WITH SUPPER **REFILLS REQUIRE OFFICE VISIT** 30 tablet 0   No current facility-administered medications  on file prior to visit.     Past Surgical History:  Procedure Laterality Date  . CARDIOVERSION N/A 01/09/2013   Procedure: CARDIOVERSION;  Surgeon: [name], MD;  Location: [facility] ENDOSCOPY;  Service: Cardiovascular;  Laterality: N/A;  . CARDIOVERSION N/A 10/07/2014   Procedure: CARDIOVERSION;  Surgeon: [name], MD;  Location: [facility] ENDOSCOPY;  Service: Cardiovascular;  Laterality: N/A;  09:00 Lido 60mg ,IV followed by Propofol  70mg /IV    synched electrocardioversion at 120 joules for Afib,repeated at 200 joules, 70 mg...successfully changed to SR  . FRACTURE SURGERY  07/21/12   right hip arthroplasty  . TOTAL HIP ARTHROPLASTY  07/21/2012   Procedure: TOTAL HIP ARTHROPLASTY;  Surgeon: [name], MD;  Location: [facility] ORS;  Service: Orthopedics;  Laterality: Right;    No Known Allergies  Social History   Socioeconomic History  . Marital status: Divorced    Spouse name: [name]  . Number of children: 1  . Years of education: 70  . Highest education level: Not on file  Occupational History  . Occupation: ED ADMISSIONS     Employer: [employer]    Comment: [employer]   Social Needs  . Financial resource strain: Not on file  . Food insecurity:    Worry: Not on file    Inability: Not on file  . Transportation needs:    Medical: Not on file    Non-medical: Not on file  Tobacco Use  . Smoking status: Former Smoker    Types: Cigarettes    Last attempt to quit: 09/13/1982    Years since quitting: 35.2  . Smokeless tobacco: Never Used  Substance and Sexual Activity  . Alcohol use: Yes    Alcohol/week: 0.0 oz    Comment: Occasionally  . Drug use: No  . Sexual activity: Yes    Partners: Male  Lifestyle  . Physical activity:    Days per week: Not on file    Minutes per session: Not on file  . Stress: Not on file  Relationships  . Social connections:    Talks on phone: Not on file    Gets together: Not on file    Attends religious service: Not on file     Active member of club or organization: Not on file    Attends meetings of clubs or organizations: Not on file    Relationship status: Not on file  . Intimate partner violence:    Fear of current or ex partner: Not on file    Emotionally abused: Not on file    Physically abused: Not on file    Forced sexual activity: Not on file  Other Topics Concern  . 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  She used to smoke 4 cigarettes per day for five years and quit 32 years ago.    Alcohol Use:  Occasional   Drug Use:  None   Diet:  Regular   Exercise:  Water Aerobics (2 x per week)     Hobbies: Shopping, Cycling              Family History  Problem Relation Age of Onset  . Hypertension Mother   . Diabetes Mother   . Alzheimer's disease Mother   . Thyroid disease Mother   . Diabetes Son     BP 113/79   Pulse 71   Ht 5\' 5"  (1.651 m)   Wt 255 lb (115.7 kg)   LMP 06/19/2013   BMI 42.43 kg/m   Review of Systems: See HPI above.    Objective:  Physical Exam:  Gen: NAD, comfortable in exam room  Back: No gross deformity, scoliosis. TTP over greater trochanter of hip.  No back tenderness. No midline or bony TTP. FROM without pain. Strength LEs 5/5 all muscle groups.   Negative SLRs. Sensation intact to light touch bilaterally.  Left hip: No deformity, scoliosis. TTP over greater trochanter. Moderate limitation with IR - pain anteriorly and laterally. Positive logroll Negative fabers and piriformis stretches.  Right knee: No gross deformity, ecchymoses, swelling. TTP medial > lateral joint line. FROM with 5/5 strength. Negative ant/post drawers. Negative valgus/varus testing. Negative  lachmanns. Negative mcmurrays, apleys, patellar apprehension. NV intact distally.  Assessment & Plan:  1. Left hip pain - 2/2 trochanteric bursitis and arthritis.We discussed options and she would like to go ahead with repeat trochanteric bursa injection which was given today.  Discussed Tylenol, topical medications, supplements, Aleve.  Consider intra-articular cortisone injection as well.  Consider physical therapy.  After informed written consent timeout was performed, patient was lying on left side on exam table.  Area overlying right trochanteric bursa prepped with alcohol swab then injected with 6:2 bupivicaine: depomedrol.  Patient tolerated procedure well without immediate complications.  2. Right knee pain -secondary to arthritis.  Cortisone injection given here as well.  Discussed Tylenol, topical medications, supplements, Aleve.  Consider Visco supplementation, physical therapy.  Follow-up in 1 month.  After informed written consent timeout was performed, patient was seated on exam table. Right knee was prepped with alcohol swab and utilizing anteromedial approach, patient's right knee was injected intraarticularly with 3:1 bupivicaine: depomedrol. Patient tolerated the procedure well without immediate complications.

## 2017-11-28 NOTE — Assessment & Plan Note (Signed)
secondary to arthritis.  Cortisone injection given here as well.  Discussed Tylenol, topical medications, supplements, Aleve.  Consider Visco supplementation, physical therapy.  Follow-up in 1 month.  After informed written consent timeout was performed, patient was seated on exam table. Right knee was prepped with alcohol swab and utilizing anteromedial approach, patient's right knee was injected intraarticularly with 3:1 bupivicaine: depomedrol. Patient tolerated the procedure well without immediate complications.

## 2017-11-28 NOTE — Assessment & Plan Note (Signed)
2/2 trochanteric bursitis and arthritis.We discussed options and she would like to go ahead with repeat trochanteric bursa injection which was given today.  Discussed Tylenol, topical medications, supplements, Aleve.  Consider intra-articular cortisone injection as well.  Consider physical therapy.  After informed written consent timeout was performed, patient was lying on left side on exam table.  Area overlying right trochanteric bursa prepped with alcohol swab then injected with 6:2 bupivicaine: depomedrol.  Patient tolerated procedure well without immediate complications.

## 2017-12-01 MED FILL — TRULICITY 1.5 MG/0.5 ML PEN: 1.5 | 28 days supply | Qty: 2 | Fill #4

## 2017-12-08 MED FILL — SERTRALINE HCL 50 MG TABLET: 50 | 30 days supply | Qty: 30 | Fill #0

## 2017-12-13 ENCOUNTER — Other Ambulatory Visit: Payer: Self-pay | Admitting: Student

## 2017-12-13 ENCOUNTER — Other Ambulatory Visit: Payer: Self-pay | Admitting: Cardiology

## 2017-12-13 MED FILL — XARELTO 20 MG TABLET: 20 | 30 days supply | Qty: 30 | Fill #0

## 2017-12-15 MED FILL — COSENTYX 300 MG DOSE-2 PENS: 150 | 28 days supply | Qty: 2 | Fill #1

## 2017-12-19 MED FILL — HYDROCHLOROTHIAZIDE 25 MG T: 25 | 90 days supply | Qty: 90 | Fill #2

## 2017-12-21 MED FILL — BYSTOLIC 10 MG TABLET: 10 | 30 days supply | Qty: 30 | Fill #1

## 2017-12-27 ENCOUNTER — Other Ambulatory Visit: Payer: Self-pay | Admitting: Rheumatology

## 2017-12-27 ENCOUNTER — Telehealth: Payer: Self-pay

## 2017-12-27 ENCOUNTER — Telehealth: Payer: Self-pay | Admitting: Rheumatology

## 2017-12-27 MED FILL — FOLIC ACID 1 MG TABS: 1 | 90 days supply | Qty: 180 | Fill #0

## 2017-12-27 NOTE — Telephone Encounter (Signed)
I LMOM for patient to call, and schedule rov with Dr. Estanislado Pandy.

## 2017-12-27 NOTE — Telephone Encounter (Signed)
Last Visit: 11/24/17 Next Visit due July 2019. Message sent to the front to schedule patient.   Okay to refill per Dr. Estanislado Pandy

## 2017-12-27 NOTE — Telephone Encounter (Signed)
   [facility] Pre-operative Risk Assessment    Request for surgical clearance:  1. What type of surgery is being performed? Platelet Rich Plasma  2. When is this surgery scheduled? TBD  3. What type of clearance is required (medical clearance vs. Pharmacy clearance to hold med vs. Both)? Pharmacy  4. Are there any medications that need to be held prior to surgery and how long? Xarelto- hold 3 prior and 3 days after  5. Practice name and name of physician performing surgery? [facility]   6. What is your office phone number [phone]    7.   What is your office fax number [phone]  8.   Anesthesia type (None, local, MAC, general) ? Unknown   Meryl Crutch 12/27/2017, 8:23 AM  _________________________________________________________________   (provider comments below)

## 2017-12-27 NOTE — Telephone Encounter (Signed)
-----   Message from Carole Binning, LPN sent at [phone]/[id number]  8:14 AM EDT ----- Regarding: Please schedule patient for follow up visit Please schedule patient for follow up visit. Patient due July 2019. Thanks!

## 2017-12-29 NOTE — Telephone Encounter (Signed)
Pt takes Xarelto for afib with CHADS2VASc score of 2 (sex, HTN). Renal function is normal.  Please clarify where patient is having plasma injections as this should overall be a low bleed risk procedure unless the spine is involved. Would not recommend holding Xarelto for a total of 6 days. Holding Xarelto for 1 day prior should be sufficient pending location of the injections.

## 2017-12-29 NOTE — Telephone Encounter (Signed)
Ok to hold Xarelto for 3 days prior to injections since the spine may be involved. Would recommend resuming within 24 hours after procedure if possible rather than waiting 3 days.

## 2017-12-29 NOTE — Telephone Encounter (Signed)
SPOKE WITH [facility]  ABOUT LOCATION OF INJECTIONS.   PER NURSE PT WILL BE GETTING  INJECTIONS IN RIGHT SHOULDER AND FACET LOCATION.

## 2018-01-03 DIAGNOSIS — R5383 Other fatigue: Secondary | ICD-10-CM | POA: Diagnosis not present

## 2018-01-03 DIAGNOSIS — R7301 Impaired fasting glucose: Secondary | ICD-10-CM | POA: Diagnosis not present

## 2018-01-05 DIAGNOSIS — R7301 Impaired fasting glucose: Secondary | ICD-10-CM | POA: Diagnosis not present

## 2018-01-05 DIAGNOSIS — H9313 Tinnitus, bilateral: Secondary | ICD-10-CM | POA: Diagnosis not present

## 2018-01-05 DIAGNOSIS — E876 Hypokalemia: Secondary | ICD-10-CM | POA: Diagnosis not present

## 2018-01-05 DIAGNOSIS — J309 Allergic rhinitis, unspecified: Secondary | ICD-10-CM | POA: Diagnosis not present

## 2018-01-05 MED FILL — TRULICITY 1.5 MG/0.5 ML PEN: 1.5 | 28 days supply | Qty: 2 | Fill #5

## 2018-01-05 MED FILL — SERTRALINE HCL 50 MG TABLET: 50 | 90 days supply | Qty: 90 | Fill #0

## 2018-01-05 MED FILL — MONTELUKAST SOD 10 MG TAB: 10 | 90 days supply | Qty: 90 | Fill #0

## 2018-01-05 MED FILL — FLUTICASONE PROP 50 MCG SPR: 50 | 90 days supply | Qty: 48 | Fill #0

## 2018-01-05 MED FILL — POTASSIUM CL 10 MEQ TAB SA: 10 | 90 days supply | Qty: 90 | Fill #0

## 2018-01-11 MED FILL — COSENTYX 300 MG DOSE-2 PENS: 150 | 28 days supply | Qty: 2 | Fill #2

## 2018-01-12 ENCOUNTER — Other Ambulatory Visit: Payer: Self-pay | Admitting: Rheumatology

## 2018-01-12 ENCOUNTER — Other Ambulatory Visit: Payer: Self-pay | Admitting: Cardiology

## 2018-01-12 MED FILL — XARELTO 20 MG TABLET: 20 | 15 days supply | Qty: 15 | Fill #0

## 2018-01-12 MED FILL — FLECAINIDE ACETATE 50 MG TA: 50 | 85 days supply | Qty: 170 | Fill #4

## 2018-01-12 MED FILL — METHOTREXATE SODIUM 2.5 MG: 2.5 | 84 days supply | Qty: 96 | Fill #0

## 2018-01-12 NOTE — Telephone Encounter (Signed)
Last Visit: 11/24/17 Next Visit: due July 2019. Message sent to the front to schedule patient.  Labs: 10/20/17 Glucose mildly elevated. Hgb and Hct stable.   Okay to refill per Dr. Estanislado Pandy

## 2018-01-12 NOTE — Telephone Encounter (Signed)
Rx sent to pharmacy   

## 2018-01-13 ENCOUNTER — Telehealth: Payer: Self-pay | Admitting: *Deleted

## 2018-01-13 NOTE — Telephone Encounter (Signed)
Patient had contacted the office stating that she had received a refill on her mTX and wanted to make sure that she was not needing to have her labs before continuing her medication. Patient advised her labs are due every 3 months and she is good until June with her labs. Patient advised to continue to her medication. Patient will come in June to pick up her medication.

## 2018-01-16 DIAGNOSIS — B353 Tinea pedis: Secondary | ICD-10-CM | POA: Diagnosis not present

## 2018-01-16 DIAGNOSIS — R21 Rash and other nonspecific skin eruption: Secondary | ICD-10-CM | POA: Diagnosis not present

## 2018-01-16 DIAGNOSIS — L400 Psoriasis vulgaris: Secondary | ICD-10-CM | POA: Diagnosis not present

## 2018-01-25 ENCOUNTER — Other Ambulatory Visit: Payer: Self-pay | Admitting: Cardiology

## 2018-01-26 MED FILL — XARELTO 20 MG TABLET: 20 | 30 days supply | Qty: 30 | Fill #0

## 2018-02-02 DIAGNOSIS — M1612 Unilateral primary osteoarthritis, left hip: Secondary | ICD-10-CM | POA: Diagnosis not present

## 2018-02-03 DIAGNOSIS — Z0000 Encounter for general adult medical examination without abnormal findings: Secondary | ICD-10-CM | POA: Diagnosis not present

## 2018-02-03 DIAGNOSIS — B373 Candidiasis of vulva and vagina: Secondary | ICD-10-CM | POA: Diagnosis not present

## 2018-02-03 DIAGNOSIS — Z6841 Body Mass Index (BMI) 40.0 and over, adult: Secondary | ICD-10-CM | POA: Diagnosis not present

## 2018-02-03 DIAGNOSIS — Z23 Encounter for immunization: Secondary | ICD-10-CM | POA: Diagnosis not present

## 2018-02-03 DIAGNOSIS — N760 Acute vaginitis: Secondary | ICD-10-CM | POA: Diagnosis not present

## 2018-02-03 DIAGNOSIS — M1612 Unilateral primary osteoarthritis, left hip: Secondary | ICD-10-CM | POA: Diagnosis not present

## 2018-02-03 DIAGNOSIS — E119 Type 2 diabetes mellitus without complications: Secondary | ICD-10-CM | POA: Diagnosis not present

## 2018-02-03 DIAGNOSIS — B9689 Other specified bacterial agents as the cause of diseases classified elsewhere: Secondary | ICD-10-CM | POA: Diagnosis not present

## 2018-02-03 MED FILL — FLUCONAZOLE 200 MG TABLET: 200 | 9 days supply | Qty: 3 | Fill #0

## 2018-02-03 MED FILL — SYNJARDY XR 25-1000 MG TB24: 25-1000 | 30 days supply | Qty: 30 | Fill #0

## 2018-02-03 MED FILL — metroNIDAZOLE 500 MG TABS: 500 | 7 days supply | Qty: 14 | Fill #0

## 2018-02-08 MED FILL — TRULICITY 1.5 MG/0.5 ML PEN: 1.5 | 28 days supply | Qty: 2 | Fill #0

## 2018-02-15 MED FILL — BYSTOLIC 10 MG TABLET: 10 | 30 days supply | Qty: 30 | Fill #2

## 2018-02-23 ENCOUNTER — Ambulatory Visit (INDEPENDENT_AMBULATORY_CARE_PROVIDER_SITE_OTHER): Payer: 59 | Admitting: Orthopaedic Surgery

## 2018-02-23 ENCOUNTER — Ambulatory Visit (INDEPENDENT_AMBULATORY_CARE_PROVIDER_SITE_OTHER): Payer: 59 | Admitting: Physician Assistant

## 2018-02-23 ENCOUNTER — Encounter (INDEPENDENT_AMBULATORY_CARE_PROVIDER_SITE_OTHER): Payer: Self-pay | Admitting: Orthopaedic Surgery

## 2018-02-23 ENCOUNTER — Encounter: Payer: Self-pay | Admitting: Physician Assistant

## 2018-02-23 VITALS — BP 115/74 | HR 68 | Ht 65.0 in | Wt 250.0 lb

## 2018-02-23 DIAGNOSIS — I480 Paroxysmal atrial fibrillation: Secondary | ICD-10-CM

## 2018-02-23 DIAGNOSIS — M5386 Other specified dorsopathies, lumbar region: Secondary | ICD-10-CM | POA: Diagnosis not present

## 2018-02-23 DIAGNOSIS — M25552 Pain in left hip: Secondary | ICD-10-CM | POA: Diagnosis not present

## 2018-02-23 DIAGNOSIS — M9905 Segmental and somatic dysfunction of pelvic region: Secondary | ICD-10-CM | POA: Diagnosis not present

## 2018-02-23 DIAGNOSIS — M9903 Segmental and somatic dysfunction of lumbar region: Secondary | ICD-10-CM | POA: Diagnosis not present

## 2018-02-23 DIAGNOSIS — M531 Cervicobrachial syndrome: Secondary | ICD-10-CM | POA: Diagnosis not present

## 2018-02-23 DIAGNOSIS — M1612 Unilateral primary osteoarthritis, left hip: Secondary | ICD-10-CM | POA: Insufficient documentation

## 2018-02-23 DIAGNOSIS — M9901 Segmental and somatic dysfunction of cervical region: Secondary | ICD-10-CM | POA: Diagnosis not present

## 2018-02-23 DIAGNOSIS — E119 Type 2 diabetes mellitus without complications: Secondary | ICD-10-CM

## 2018-02-23 DIAGNOSIS — I10 Essential (primary) hypertension: Secondary | ICD-10-CM | POA: Diagnosis not present

## 2018-02-23 DIAGNOSIS — M9902 Segmental and somatic dysfunction of thoracic region: Secondary | ICD-10-CM | POA: Diagnosis not present

## 2018-02-23 DIAGNOSIS — Z01810 Encounter for preprocedural cardiovascular examination: Secondary | ICD-10-CM | POA: Diagnosis not present

## 2018-02-23 DIAGNOSIS — M62830 Muscle spasm of back: Secondary | ICD-10-CM | POA: Diagnosis not present

## 2018-02-23 NOTE — Patient Instructions (Signed)
Medication Instructions:  Your physician recommends that you continue on your current medications as directed. Please refer to the Current Medication list given to you today.  Labwork: None   Testing/Procedures: none  Follow-Up: Your physician wants you to follow-up in: 12 months with Dr Stanford Breed. You will receive a reminder letter in the mail two months in advance. If you don't receive a letter, please call our office to schedule the follow-up appointment.  Any Other Special Instructions Will Be Listed Below (If Applicable) If you need a refill on your cardiac medications before your next appointment, please call your pharmacy.

## 2018-02-23 NOTE — Progress Notes (Signed)
Cardiology Office Note    Date:  02/25/2018   ID:  Monica [name], DOB [birth year], MRN [id number]  PCP:  [name], MD  Cardiologist: Dr. Stanford Breed  Chief Complaint  Patient presents with  . Follow-up    seen for Dr. Stanford Breed.     History of Present Illness:  Monica [name] is a 62 y.o. female with PMH of PAF on flecainide, hypertension, DM II, morbid obesity.  She had cardioversion both in May 2014 and February 2016.  Previous echocardiogram obtained on 10/11/2014 showed EF 55%, mild to moderate MR, severely dilated left atrium, mildly dilated right atrium, moderate TR, PA peak pressure 48 mmHg.  Exercise tolerance test obtained on the same day was negative for ischemia, however overall it was a poor quality study as the patient was only able to exercise for duration of 2 minutes 10 seconds and did not achieve target heart rate.  She was previously seen by Dr. Stanford Breed in February 2017.  She was continued on flecainide and Xarelto at the time.  She was last seen by [name], PA-C on 11/19/2016 at which time she was complaining of new onset tinnitus.  Her Lasix was stopped at that time by her PCP and switched to [facility].  She presents to cardiology service today for annual visit.  Prior to today's visit, she was seen by Dr. [name] of orthopedic surgery for her hip problem.  It was recommended for her to have a left total hip surgery next month.  She continued to have some baseline dyspnea, this has been unchanged.  Otherwise she denies any chest pain duration.  She has stairs at her home and frequently walk at least 2 flight of stairs on a daily basis.  She denies any exertional type of symptom.  No further work-up is needed in this case since patient is able to complete at least 4 METS of activity. Her RCRI risk score is class I, 0.4% risk of Major Cardiac Event    Past Medical History:  Diagnosis Date  . Atrial fibrillation ([place])    a. s/p DCCV in 12/2012 b. repeat  DCCV in 09/2014 - started on Flecainide  . Avascular necrosis of bone of right hip ([place])   . Hypertension   . Morbid obesity ([place])   . Osteoarthritis   . Personal history of colonic polyps - large hyperplastic 12/25/2013  . Psoriasis    active breakout left buttocks  . Right hip pain   . Shortness of breath   . Tachycardia, unspecified     Past Surgical History:  Procedure Laterality Date  . CARDIOVERSION N/A 01/09/2013   Procedure: CARDIOVERSION;  Surgeon: [name], MD;  Location: [facility] ENDOSCOPY;  Service: Cardiovascular;  Laterality: N/A;  . CARDIOVERSION N/A 10/07/2014   Procedure: CARDIOVERSION;  Surgeon: [name], MD;  Location: [facility] ENDOSCOPY;  Service: Cardiovascular;  Laterality: N/A;  09:00 Lido 60mg ,IV followed by Propofol  70mg /IV    synched electrocardioversion at 120 joules for Afib,repeated at 200 joules, 70 mg...successfully changed to SR  . FRACTURE SURGERY  07/21/12   right hip arthroplasty  . TOTAL HIP ARTHROPLASTY  07/21/2012   Procedure: TOTAL HIP ARTHROPLASTY;  Surgeon: [name], MD;  Location: [facility] ORS;  Service: Orthopedics;  Laterality: Right;    Current Medications: Outpatient Medications Prior to Visit  Medication Sig Dispense Refill  . Acetaminophen (TYLENOL ARTHRITIS PAIN PO) Take by mouth.    . BYSTOLIC 10 MG tablet Take 5  tablets by mouth daily.   3  . cetirizine (ZYRTEC) 10 MG tablet Take 10 mg by mouth daily.    . Cholecalciferol (VITAMIN D PO) Take by mouth.    . desonide (DESOWEN) 0.05 % cream   0  . diclofenac sodium (VOLTAREN) 1 % GEL Apply 4 g topically 4 (four) times daily. (Patient taking differently: Apply 4 g topically 4 (four) times daily as needed. ) 5 Tube 1  . flecainide (TAMBOCOR) 50 MG tablet TAKE 1 TABLET (50 MG TOTAL) BY MOUTH 2 (TWO) TIMES DAILY. 180 tablet 3  . fluticasone (FLONASE) 50 MCG/ACT nasal spray Place 2 sprays into both nostrils as needed. 16 g 0  . folic acid (FOLVITE) 1 MG tablet TAKE 2 TABLETS (2 MG  TOTAL) BY MOUTH DAILY. 180 tablet 0  . hydrochlorothiazide (HYDRODIURIL) 25 MG tablet   3  . methotrexate 2.5 MG tablet TAKE 8 TABLETS (20 MG TOTAL) BY MOUTH ONCE A WEEK. 96 tablet 0  . Multiple Vitamins-Minerals (MULTIVITAMIN WITH MINERALS) tablet Take 1 tablet by mouth daily with supper.    . Olopatadine HCl 0.2 % SOLN Apply 1 drop to eye daily. 5 mL 0  . ondansetron (ZOFRAN) 4 MG tablet Take 1 tablet (4 mg total) by mouth every 8 (eight) hours as needed for nausea or vomiting. 30 tablet 1  . potassium chloride (K-DUR,KLOR-CON) 10 MEQ tablet Take 1 tablet (10 mEq total) by mouth 2 (two) times daily. (Patient taking differently: Take 10 mEq by mouth daily. ) 180 tablet 3  . ranitidine (ZANTAC) 300 MG tablet Take by mouth.    . Secukinumab (COSENTYX SENSOREADY 300 DOSE) 150 MG/ML SOAJ Inject 2 mLs into the skin as directed. Inject 2 pens (2 ml) every 4 weeks 2 pen 2  . sertraline (ZOLOFT) 50 MG tablet   1  . TRULICITY 1.5 YB/0.1BP SOPN   5  . XARELTO 20 MG TABS tablet TAKE 1 TABLET BY MOUTH DAILY WITH SUPPER **REFILLS REQUIRE OFFICE VISIT** 30 tablet 0  . HYDROcodone-acetaminophen (NORCO/VICODIN) 5-325 MG tablet Take 1-2 tablets by mouth every 4 (four) hours as needed for moderate pain. 20 tablet 0  . nebivolol (BYSTOLIC) 5 MG tablet Take 5 mg by mouth daily.    Marland Kitchen triamterene-hydrochlorothiazide (MAXZIDE-25) 37.5-25 MG tablet Take by mouth.    . Vitamin D, Ergocalciferol, (DRISDOL) 50000 UNITS CAPS capsule Take 1 capsule (50,000 Units total) by mouth 2 (two) times a week. (Patient not taking: Reported on 11/24/2017) 8 capsule 11  . XARELTO 20 MG TABS tablet TAKE 1 TABLET (20 MG TOTAL) BY MOUTH DAILY WITH SUPPER. SCHEDULE FOLLOW UP WITH CARDIOLOGIST PRIOR TO NEXT REFILL 15 tablet 0  . XARELTO 20 MG TABS tablet Take 1 tablet (20 mg total) by mouth daily with supper. 30 tablet 0   No facility-administered medications prior to visit.      Allergies:   Patient has no known allergies.   Social  History   Socioeconomic History  . Marital status: Divorced    Spouse name: [name]  . Number of children: 1  . Years of education: 56  . Highest education level: Not on file  Occupational History  . Occupation: ED ADMISSIONS     Employer: [employer]    Comment: [employer]   Social Needs  . Financial resource strain: Not on file  . Food insecurity:    Worry: Not on file    Inability: Not on file  . Transportation needs:    Medical:  Not on file    Non-medical: Not on file  Tobacco Use  . Smoking status: Former Smoker    Types: Cigarettes    Last attempt to quit: 09/13/1982    Years since quitting: 35.4  . Smokeless tobacco: Never Used  Substance and Sexual Activity  . Alcohol use: Yes    Alcohol/week: 0.0 oz    Comment: Occasionally  . Drug use: No  . Sexual activity: Yes    Partners: Male  Lifestyle  . Physical activity:    Days per week: Not on file    Minutes per session: Not on file  . Stress: Not on file  Relationships  . Social connections:    Talks on phone: Not on file    Gets together: Not on file    Attends religious service: Not on file    Active member of club or organization: Not on file    Attends meetings of clubs or organizations: Not on file    Relationship status: Not on file  Other Topics Concern  . 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    EducationField seismologist    Tobacco Use/Exposure: Former Smoker.  She used to smoke 4 cigarettes per day for five years and quit 32 years ago.    Alcohol Use:  Occasional   Drug Use:  None   Diet:  Regular   Exercise:  Water Aerobics (2 x per week)     Hobbies: Shopping, Cycling               Family History:  The patient's family history includes Alzheimer's disease in her mother; Diabetes in  her mother and son; Hypertension in her mother; Thyroid disease in her mother.   ROS:   Please see the history of present illness.    ROS All other systems reviewed and are negative.   PHYSICAL EXAM:   VS:  BP 115/74   Pulse 68   Ht 5\' 5"  (1.651 m)   Wt 250 lb (113.4 kg)   LMP 06/19/2013   BMI 41.60 kg/m    GEN: Well nourished, well developed, in no acute distress  HEENT: normal  Neck: no JVD, carotid bruits, or masses Cardiac: RRR; no murmurs, rubs, or gallops,no edema  Respiratory:  clear to auscultation bilaterally, normal work of breathing GI: soft, nontender, nondistended, + BS MS: no deformity or atrophy  Skin: warm and dry, no rash Neuro:  Alert and Oriented x 3, Strength and sensation are intact Psych: euthymic mood, full affect  Wt Readings from Last 3 Encounters:  02/23/18 250 lb (113.4 kg)  11/24/17 255 lb (115.7 kg)  11/24/17 250 lb (113.4 kg)      Studies/Labs Reviewed:   EKG:  EKG is ordered today.  The ekg ordered today demonstrates normal sinus rhythm, nonspecific T wave changes  Recent Labs: 09/16/2017: TSH 1.95 10/20/2017: ALT 14; BUN 9; Creat 0.80; Hemoglobin 11.2; Platelets 352; Potassium 3.5; Sodium 141   Lipid Panel    Component Value Date/Time   CHOL 149 07/24/2013 0922   TRIG 101 07/24/2013 0922   HDL 42 07/24/2013 0922   CHOLHDL 3.5 07/24/2013 0922   VLDL 20 07/24/2013 0922   LDLCALC 87 07/24/2013 0922    Additional studies/ records that  were reviewed today include:   ETT 10/11/2014    Echo 10/11/2014 LV EF: 55% Study Conclusions  - Left ventricle: The cavity size was normal. Wall thickness was normal. The estimated ejection fraction was 55%. - Mitral valve: There was mild to moderate regurgitation. - Left atrium: The atrium was severely dilated. - Right atrium: The atrium was mildly dilated. - Atrial septum: There was increased thickness of the septum, consistent with lipomatous hypertrophy. No defect or patent foramen  ovale was identified. - Tricuspid valve: There was moderate regurgitation. - Pulmonary arteries: PA peak pressure: 48 mm Hg (S).    ASSESSMENT:    1. Preop cardiovascular exam   2. Paroxysmal atrial fibrillation (HCC)   3. Essential hypertension   4. Controlled type 2 diabetes mellitus without complication, without long-term current use of insulin ([place])   5. Morbid obesity ([place])      PLAN:  In order of problems listed above:  1. Preoperative clearance: She is due for upcoming hip surgery by Dr. [name].  Her RCRI risk score is class I, 0.4% risk of major cardiac event during surgery.  She is able to complete at least 4 METs of activity without any issue.  She may proceed with surgery without further work-up.  She may hold Xarelto for 3 to 4 days prior to the surgery and restart as soon as possible afterward.  2. Paroxysmal atrial fibrillation: Continue Xarelto until prior to surgery, may hold Xarelto for 3 to 4 days prior to surgery.  3. Hypertension: blood pressure stable  4. DM2: Managed by primary care provider  5. Morbid obesity: She has lost weight compared to last year.    Medication Adjustments/Labs and Tests Ordered: Current medicines are reviewed at length with the patient today.  Concerns regarding medicines are outlined above.  Medication changes, Labs and Tests ordered today are listed in the Patient Instructions below. Patient Instructions  Medication Instructions:  Your physician recommends that you continue on your current medications as directed. Please refer to the Current Medication list given to you today.  Labwork: None   Testing/Procedures: none  Follow-Up: Your physician wants you to follow-up in: 12 months with Dr Stanford Breed. You will receive a reminder letter in the mail two months in advance. If you don't receive a letter, please call our office to schedule the follow-up appointment.  Any Other Special Instructions Will Be Listed Below (If  Applicable) If you need a refill on your cardiac medications before your next appointment, please call your pharmacy.     Hilbert Corrigan, [place]  02/25/2018 11:57 PM    [facility] [address], [place], [place]  [address] Phone: [phone]; Fax: [phone]

## 2018-02-23 NOTE — Progress Notes (Signed)
Office Visit Note   Patient: Monica [name]           Date of Birth: May 07, 1956           MRN: [id number] Visit Date: 02/23/2018              Requested by: [name], MD [address] [place], [place] [address] PCP: [name], MD   Assessment & Plan: Visit Diagnoses:  1. Unilateral primary osteoarthritis, left hip   2. Pain of left hip joint     Plan: I did lay her in a supine position and look at the soft tissue interval getting to her hip and I find no obstructions with having an anterior approach surgery performed for left hip replacement.  I am very comfortable with proceeding with the surgery based on the clinical exam as well.  She is heading the right direction in terms of her weight loss and her blood glucose control.  I am confident we can get her through the surgery with minimal risk and minimal issues.  She did let me know that the last surgeon who saw her did not examine her and just looked at her weight.  I do feel to essentially be able to place a patient on the exam table and to get a better idea of where surgery is being performed anatomically so he can truly make a decision whether or not there is any issue.  Again my hands I do not feel there is an issue based on the expense of head with anterior hip surgery.  Certainly she is at heightened risk being a diabetic and obese however her blood glucose is under good control and I feel comfortable of where her weight is on her body in terms of success of being able to perform a direct anterior left hip replacement.  All question concerns were answered and addressed.  We did talk about her intraoperative and postoperative course.  I would have her stop Xarelto 4 days before surgery.  We discussed the risk and benefits of the surgery in detail.  We will work on getting a scheduled sometime in the next month.  Follow-Up Instructions: Return for 2 weeks post-op.   Orders:  No orders of the defined types were  placed in this encounter.  No orders of the defined types were placed in this encounter.     Procedures: No procedures performed   Clinical Data: No additional findings.   Subjective: Chief Complaint  Patient presents with  . Left Hip - Pain  Patient is a very pleasant 62 year old female who comes for evaluation and treatment of known left hip osteoarthritis and degenerative joint disease.  She actually had a right hip replaced remotely by another surgeon in town.  She is someone who is obese with a body mass index of 42.4.  She is also diabetic with a hemoglobin A1c below 7.  She is lost 30 pounds over this last year.  She has severe pain in her left hip and was told by another orthopedic surgeon in town that she did lose more weight before proceeding with surgery.  She is seeking another opinion with me.  She does work in 1 to the med center is here in town.  She is on Xarelto for chronic afibrillation I can come off this for surgery.  She walks with a significant limp.  Her pain is daily.  It can be 10 out of 10 at times.  Her left hip pain is detrimental effect directives daily living, quality of life, mobility.  She is done well with the right hip replacement.  She reports compliance with blood glucose medications has been trying everything she can do to lose weight again is lost 30 pounds over the last year.  HPI  Review of Systems She currently denies any headache, chest pain, short of breath, fever, chills, nausea, vomiting.  Objective: Vital Signs: LMP 06/19/2013   Physical Exam She is alert and oriented x3 and in no acute distress Ortho Exam Examination of her right hip is normal.  Examination of her left hip shows severe pain with internal and external rotation. Specialty Comments:  No specialty comments available.  Imaging: No results found. X-rays that accompany her of her left hip that are independently reviewed show severe end-stage arthritis of that hip.  I  believe there is a history of osteoarthritis and osteonecrosis.  There is joint space narrowing and an irregular cartilage on the femoral head.  There are periarticular osteophytes as well.  There are even cystic changes in the femoral head.  PMFS History: Patient Active Problem List   Diagnosis Date Noted  . Unilateral primary osteoarthritis, left hip 02/23/2018  . Psoriatic arthropathy ([place]) 11/17/2017  . History of total hip replacement, right 11/17/2017  . Chronic anticoagulation 11/19/2016  . Tinnitus 11/19/2016  . Right knee pain 07/06/2016  . Left hip pain 10/21/2015  . Gastroesophageal reflux disease 09/19/2015  . Itchy eyes 03/12/2015  . Allergic conjunctivitis 03/12/2015  . OSA (obstructive sleep apnea) 02/20/2015  . Morbid obesity ([place]) 01/15/2015  . Lymphedema 06/19/2014  . Personal history of colonic polyps - large hyperplastic 12/25/2013  . Skin infection 10/14/2013  . Candidiasis of female genitalia 10/14/2013  . IFG (impaired fasting glucose) 10/14/2013  . Carpal tunnel syndrome 10/14/2013  . Cough 06/20/2013  . Allergic rhinitis, cause unspecified 06/20/2013  . Backache 06/05/2013  . Edema 12/20/2012  . Swelling of limb 11/30/2012  . Essential hypertension, benign 11/30/2012  . Headache(784.0) 11/30/2012  . Avascular necrosis of hip ([place]) 07/21/2012  . Atrial fibrillation ([place]) 07/06/2012  . Hypertension 09/14/2011  . Psoriasis 08/16/2005   Past Medical History:  Diagnosis Date  . Atrial fibrillation ([place])    a. s/p DCCV in 12/2012 b. repeat DCCV in 09/2014 - started on Flecainide  . Avascular necrosis of bone of right hip ([place])   . Hypertension   . Morbid obesity ([place])   . Osteoarthritis   . Personal history of colonic polyps - large hyperplastic 12/25/2013  . Psoriasis    active breakout left buttocks  . Right hip pain   . Shortness of breath   . Tachycardia, unspecified     Family History  Problem Relation Age of Onset  . Hypertension Mother   .  Diabetes Mother   . Alzheimer's disease Mother   . Thyroid disease Mother   . Diabetes Son     Past Surgical History:  Procedure Laterality Date  . CARDIOVERSION N/A 01/09/2013   Procedure: CARDIOVERSION;  Surgeon: [name], MD;  Location: [facility] ENDOSCOPY;  Service: Cardiovascular;  Laterality: N/A;  . CARDIOVERSION N/A 10/07/2014   Procedure: CARDIOVERSION;  Surgeon: [name], MD;  Location: [facility] ENDOSCOPY;  Service: Cardiovascular;  Laterality: N/A;  09:00 Lido 60mg ,IV followed by Propofol  70mg /IV    synched electrocardioversion at 120 joules for Afib,repeated at 200 joules, 70 mg...successfully changed to SR  . FRACTURE SURGERY  07/21/12   right hip  arthroplasty  . TOTAL HIP ARTHROPLASTY  07/21/2012   Procedure: TOTAL HIP ARTHROPLASTY;  Surgeon: [name], MD;  Location: [facility] ORS;  Service: Orthopedics;  Laterality: Right;   Social History   Occupational History  . Occupation: ED ADMISSIONS     Employer: [employer]    Comment: [employer]   Tobacco Use  . Smoking status: Former Smoker    Types: Cigarettes    Last attempt to quit: 09/13/1982    Years since quitting: 35.4  . Smokeless tobacco: Never Used  Substance and Sexual Activity  . Alcohol use: Yes    Alcohol/week: 0.0 oz    Comment: Occasionally  . Drug use: No  . Sexual activity: Yes    Partners: Male

## 2018-02-25 ENCOUNTER — Encounter: Payer: Self-pay | Admitting: Physician Assistant

## 2018-02-27 ENCOUNTER — Other Ambulatory Visit: Payer: Self-pay | Admitting: Pharmacist

## 2018-02-27 MED ORDER — SECUKINUMAB 150 MG/ML ~~LOC~~ SOAJ: 2.0000 mL | SUBCUTANEOUS | 3 refills | Status: DC

## 2018-02-27 MED FILL — COSENTYX 300 MG DOSE-2 PENS: 150 | 28 days supply | Qty: 2 | Fill #0

## 2018-02-28 ENCOUNTER — Telehealth: Payer: Self-pay | Admitting: Cardiology

## 2018-02-28 ENCOUNTER — Other Ambulatory Visit: Payer: Self-pay | Admitting: Cardiology

## 2018-02-28 NOTE — Telephone Encounter (Signed)
New Message:       *STAT* If patient is at the pharmacy, call can be transferred to refill team.   1. Which medications need to be refilled? (please list name of each medication and dose if known) XARELTO 20 MG TABS tablet  2. Which pharmacy/location (including street and city if local pharmacy) is medication to be sent to?[place], [place] - [facility]  3. Do they need a 30 day or 90 day supply? [place]

## 2018-03-01 MED FILL — XARELTO 20 MG TABLET: 20 | 90 days supply | Qty: 90 | Fill #0

## 2018-03-01 NOTE — Telephone Encounter (Signed)
RX sent today

## 2018-03-10 MED FILL — TRULICITY 1.5 MG/0.5 ML PEN: 1.5 | 28 days supply | Qty: 2 | Fill #1

## 2018-03-14 ENCOUNTER — Other Ambulatory Visit (INDEPENDENT_AMBULATORY_CARE_PROVIDER_SITE_OTHER): Payer: Self-pay

## 2018-03-21 MED FILL — HYDROCHLOROTHIAZIDE 25 MG T: 25 | 90 days supply | Qty: 90 | Fill #3

## 2018-03-28 ENCOUNTER — Other Ambulatory Visit: Payer: Self-pay | Admitting: *Deleted

## 2018-03-28 ENCOUNTER — Other Ambulatory Visit (INDEPENDENT_AMBULATORY_CARE_PROVIDER_SITE_OTHER): Payer: Self-pay | Admitting: Physician Assistant

## 2018-03-28 NOTE — Patient Instructions (Addendum)
Monica [name]  03/28/2018   Your procedure is scheduled on: 04/07/2018  Report to [facility] Main  Entrance  Report to admitting at   [place] AM    Call this number if you have problems the morning of surgery [phone]   Remember: Do not eat food or drink liquids :After Midnight.     Take these medicines the morning of surgery with A SIP OF WATER: Bystolic, Tambocor, Flonase, eye drops as needed               Do not take any diabetic medications the day of your surgery                               You may not have any metal on your body including hair pins and              piercings  Do not wear jewelry, make-up, lotions, powders or perfumes, deodorant             Do not wear nail polish.  Do not shave  48 hours prior to surgery.                Do not bring valuables to the hospital. [place].  Contacts, dentures or bridgework may not be worn into surgery.  Leave suitcase in the car. After surgery it may be brought to your room.                     Please read over the following fact sheets you were given: _____________________________________________________________________             [facility] - Preparing for Surgery Before surgery, you can play an important role.  Because skin is not sterile, your skin needs to be as free of germs as possible.  You can reduce the number of germs on your skin by washing with CHG (chlorahexidine gluconate) soap before surgery.  CHG is an antiseptic cleaner which kills germs and bonds with the skin to continue killing germs even after washing. Please DO NOT use if you have an allergy to CHG or antibacterial soaps.  If your skin becomes reddened/irritated stop using the CHG and inform your nurse when you arrive at Short Stay. Do not shave (including legs and underarms) for at least 48 hours prior to the first CHG shower.  You may shave your face/neck. Please follow  these instructions carefully:  1.  Shower with CHG Soap the night before surgery and the  morning of Surgery.  2.  If you choose to wash your hair, wash your hair first as usual with your  normal  shampoo.  3.  After you shampoo, rinse your hair and body thoroughly to remove the  shampoo.                           4.  Use CHG as you would any other liquid soap.  You can apply chg directly  to the skin and wash                       Gently with a scrungie or clean washcloth.  5.  Apply the CHG Soap to your body ONLY FROM THE NECK DOWN.   Do not use on face/ open                           Wound or open sores. Avoid contact with eyes, ears mouth and genitals (private parts).                       Wash face,  Genitals (private parts) with your normal soap.             6.  Wash thoroughly, paying special attention to the area where your surgery  will be performed.  7.  Thoroughly rinse your body with warm water from the neck down.  8.  DO NOT shower/wash with your normal soap after using and rinsing off  the CHG Soap.                9.  Pat yourself dry with a clean towel.            10.  Wear clean pajamas.            11.  Place clean sheets on your bed the night of your first shower and do not  sleep with pets. Day of Surgery : Do not apply any lotions/deodorants the morning of surgery.  Please wear clean clothes to the hospital/surgery center.  FAILURE TO FOLLOW THESE INSTRUCTIONS MAY RESULT IN THE CANCELLATION OF YOUR SURGERY PATIENT SIGNATURE_________________________________  NURSE SIGNATURE__________________________________  ________________________________________________________________________  WHAT IS A BLOOD TRANSFUSION? Blood Transfusion Information  A transfusion is the replacement of blood or some of its parts. Blood is made up of multiple cells which provide different functions.  Red blood cells carry oxygen and are used for blood loss replacement.  White blood cells fight  against infection.  Platelets control bleeding.  Plasma helps clot blood.  Other blood products are available for specialized needs, such as hemophilia or other clotting disorders. BEFORE THE TRANSFUSION  Who gives blood for transfusions?   Healthy volunteers who are fully evaluated to make sure their blood is safe. This is blood bank blood. Transfusion therapy is the safest it has ever been in the practice of medicine. Before blood is taken from a donor, a complete history is taken to make sure that person has no history of diseases nor engages in risky social behavior (examples are intravenous drug use or sexual activity with multiple partners). The donor's travel history is screened to minimize risk of transmitting infections, such as malaria. The donated blood is tested for signs of infectious diseases, such as HIV and hepatitis. The blood is then tested to be sure it is compatible with you in order to minimize the chance of a transfusion reaction. If you or a relative donates blood, this is often done in anticipation of surgery and is not appropriate for emergency situations. It takes many days to process the donated blood. RISKS AND COMPLICATIONS Although transfusion therapy is very safe and saves many lives, the main dangers of transfusion include:   Getting an infectious disease.  Developing a transfusion reaction. This is an allergic reaction to something in the blood you were given. Every precaution is taken to prevent this. The decision to have a blood transfusion has been considered carefully by your caregiver before blood is given. Blood is not given unless the benefits outweigh the risks. AFTER THE TRANSFUSION  Right after receiving a  blood transfusion, you will usually feel much better and more energetic. This is especially true if your red blood cells have gotten low (anemic). The transfusion raises the level of the red blood cells which carry oxygen, and this usually causes an  energy increase.  The nurse administering the transfusion will monitor you carefully for complications. HOME CARE INSTRUCTIONS  No special instructions are needed after a transfusion. You may find your energy is better. Speak with your caregiver about any limitations on activity for underlying diseases you may have. SEEK MEDICAL CARE IF:   Your condition is not improving after your transfusion.  You develop redness or irritation at the intravenous (IV) site. SEEK IMMEDIATE MEDICAL CARE IF:  Any of the following symptoms occur over the next 12 hours:  Shaking chills.  You have a temperature by mouth above 102 F (38.9 C), not controlled by medicine.  Chest, back, or muscle pain.  People around you feel you are not acting correctly or are confused.  Shortness of breath or difficulty breathing.  Dizziness and fainting.  You get a rash or develop hives.  You have a decrease in urine output.  Your urine turns a dark color or changes to pink, red, or brown. Any of the following symptoms occur over the next 10 days:  You have a temperature by mouth above 102 F (38.9 C), not controlled by medicine.  Shortness of breath.  Weakness after normal activity.  The white part of the eye turns yellow (jaundice).  You have a decrease in the amount of urine or are urinating less often.  Your urine turns a dark color or changes to pink, red, or brown. Document Released: 07/30/2000 Document Revised: 10/25/2011 Document Reviewed: 03/18/2008 ExitCare Patient Information 2014 [place].  _______________________________________________________________________  Incentive Spirometer  An incentive spirometer is a tool that can help keep your lungs clear and active. This tool measures how well you are filling your lungs with each breath. Taking long deep breaths may help reverse or decrease the chance of developing breathing (pulmonary) problems (especially infection) following:  A  long period of time when you are unable to move or be active. BEFORE THE PROCEDURE   If the spirometer includes an indicator to show your best effort, your nurse or respiratory therapist will set it to a desired goal.  If possible, sit up straight or lean slightly forward. Try not to slouch.  Hold the incentive spirometer in an upright position. INSTRUCTIONS FOR USE  1. Sit on the edge of your bed if possible, or sit up as far as you can in bed or on a chair. 2. Hold the incentive spirometer in an upright position. 3. Breathe out normally. 4. Place the mouthpiece in your mouth and seal your lips tightly around it. 5. Breathe in slowly and as deeply as possible, raising the piston or the ball toward the top of the column. 6. Hold your breath for 3-5 seconds or for as long as possible. Allow the piston or ball to fall to the bottom of the column. 7. Remove the mouthpiece from your mouth and breathe out normally. 8. Rest for a few seconds and repeat Steps 1 through 7 at least 10 times every 1-2 hours when you are awake. Take your time and take a few normal breaths between deep breaths. 9. The spirometer may include an indicator to show your best effort. Use the indicator as a goal to work toward during each repetition. 10. After each set of 10  deep breaths, practice coughing to be sure your lungs are clear. If you have an incision (the cut made at the time of surgery), support your incision when coughing by placing a pillow or rolled up towels firmly against it. Once you are able to get out of bed, walk around indoors and cough well. You may stop using the incentive spirometer when instructed by your caregiver.  RISKS AND COMPLICATIONS  Take your time so you do not get dizzy or light-headed.  If you are in pain, you may need to take or ask for pain medication before doing incentive spirometry. It is harder to take a deep breath if you are having pain. AFTER USE  Rest and breathe slowly and  easily.  It can be helpful to keep track of a log of your progress. Your caregiver can provide you with a simple table to help with this. If you are using the spirometer at home, follow these instructions: [place] IF:   You are having difficultly using the spirometer.  You have trouble using the spirometer as often as instructed.  Your pain medication is not giving enough relief while using the spirometer.  You develop fever of 100.5 F (38.1 C) or higher. SEEK IMMEDIATE MEDICAL CARE IF:   You cough up bloody sputum that had not been present before.  You develop fever of 102 F (38.9 C) or greater.  You develop worsening pain at or near the incision site. MAKE SURE YOU:   Understand these instructions.  Will watch your condition.  Will get help right away if you are not doing well or get worse. Document Released: 12/13/2006 Document Revised: 10/25/2011 Document Reviewed: 02/13/2007 [facility] Patient Information 2014 [place], [place].   ________________________________________________________________________

## 2018-03-28 NOTE — Patient Outreach (Signed)
[facility]) Care Management  03/28/2018  MYRLA MALANOWSKI Dec 01, 1955 [id number]   Subjective: Telephone call to patient's home number, no answer, voicemail full, and unable to leave a message.    Objective: Per KPN (Knowledge Performance Now, point of care tool) and chart review, patient to be admitted 04/07/18 for  LEFT TOTAL HIP ARTHROPLASTY ANTERIOR APPROACH at [facility].       Assessment: Received UMR Preoperative / Transition of care referral on 03/21/18.   Preoperative call follow up pending patient contact.  Patient also has a history of diabetes, hypertension, Atrial fibrillation, Osteoarthritis,     Plan:RNCM will call patient for 2nd telephone outreach attempt, preoperative call follow up, within 10 business days if no return call.     Braelynn Lupton H. Annia Friendly, BSN, [place] Management [facility] Telephonic CM Phone: [phone] Fax: [phone]

## 2018-03-30 ENCOUNTER — Encounter (HOSPITAL_COMMUNITY): Payer: Self-pay

## 2018-03-30 ENCOUNTER — Encounter (HOSPITAL_COMMUNITY)
Admission: RE | Admit: 2018-03-30 | Discharge: 2018-03-30 | Disposition: A | Discharge status: Home / Self Care | Payer: 59 | Source: Ambulatory Visit | Attending: Orthopaedic Surgery | Admitting: Orthopaedic Surgery

## 2018-03-30 ENCOUNTER — Other Ambulatory Visit: Payer: Self-pay

## 2018-03-30 DIAGNOSIS — M1612 Unilateral primary osteoarthritis, left hip: Secondary | ICD-10-CM | POA: Diagnosis not present

## 2018-03-30 DIAGNOSIS — Z01818 Encounter for other preprocedural examination: Secondary | ICD-10-CM | POA: Diagnosis not present

## 2018-03-30 HISTORY — DX: Sleep apnea, unspecified: G47.30

## 2018-03-30 HISTORY — DX: Prediabetes: R73.03

## 2018-03-30 HISTORY — DX: Cardiac arrhythmia, unspecified: I49.9

## 2018-03-30 LAB — CBC
HCT: 32.8 % — ABNORMAL LOW (ref 36.0–46.0)
Hemoglobin: 10.7 g/dL — ABNORMAL LOW (ref 12.0–15.0)
MCH: 29.6 pg (ref 26.0–34.0)
MCHC: 32.6 g/dL (ref 30.0–36.0)
MCV: 90.9 fL (ref 78.0–100.0)
PLATELETS: 278 10*3/uL (ref 150–400)
RBC: 3.61 MIL/uL — ABNORMAL LOW (ref 3.87–5.11)
RDW: 14.7 % (ref 11.5–15.5)
WBC: 3.6 10*3/uL — ABNORMAL LOW (ref 4.0–10.5)

## 2018-03-30 LAB — BASIC METABOLIC PANEL
Anion gap: 10 (ref 5–15)
BUN: 12 mg/dL (ref 6–20)
CALCIUM: 9.3 mg/dL (ref 8.9–10.3)
CO2: 29 mmol/L (ref 22–32)
CREATININE: 0.81 mg/dL (ref 0.44–1.00)
Chloride: 103 mmol/L (ref 98–111)
GFR calc non Af Amer: >60 mL/min (ref >60)
Glucose, Bld: 87 mg/dL (ref 70–99)
Potassium: 3.5 mmol/L (ref 3.5–5.1)
Sodium: 142 mmol/L (ref 135–145)

## 2018-03-30 LAB — SURGICAL PCR SCREEN
MRSA, PCR: NEGATIVE
Staphylococcus aureus: NEGATIVE

## 2018-03-30 LAB — HEMOGLOBIN A1C
HEMOGLOBIN A1C: 5.5 % (ref 4.8–5.6)
MEAN PLASMA GLUCOSE: 111.15 mg/dL

## 2018-03-30 NOTE — Progress Notes (Signed)
[facility] cardiology 02-23-18 epic  EKG 02-23-18 epic

## 2018-03-31 ENCOUNTER — Other Ambulatory Visit: Payer: 59 | Admitting: *Deleted

## 2018-03-31 NOTE — Patient Outreach (Signed)
[facility]) Care Management  03/31/2018  Monica [name] 07-23-1956 [id number]  Subjective: Telephone call to patient's home number, no answer, left HIPAA compliant voicemail message, and requested call back.    Objective: Per KPN (Knowledge Performance Now, point of care tool) and chart review, patient to be admitted 04/07/18 for  LEFT TOTAL HIP ARTHROPLASTY ANTERIOR APPROACH at [facility].       Assessment: Received UMR Preoperative / Transition of care referral on 03/21/18.   Preoperative call follow up pending patient contact.  Patient also has a history of diabetes, hypertension, Atrial fibrillation, Osteoarthritis,     Plan:RNCM will call patient for 3rd telephone outreach attempt, preoperative call follow up, within 10 business days if no return call.     Monica [name], BSN, [place] Management [facility] Telephonic CM Phone: [phone] Fax: [phone]

## 2018-04-03 ENCOUNTER — Telehealth (INDEPENDENT_AMBULATORY_CARE_PROVIDER_SITE_OTHER): Payer: Self-pay | Admitting: Orthopaedic Surgery

## 2018-04-03 NOTE — Telephone Encounter (Signed)
Patient called wanting to know what medications she is not suppose to take and which ones she is able to take.  She stated that she uses the hemp cream for her hip and wanted to know if she should or need to stop using that before her surgery.  She does work 2nd shift, so she usually takes her medication between 3 and 5 p.m. Please advise.  CB#[phone].  Thank you.

## 2018-04-03 NOTE — Telephone Encounter (Signed)
LMOM for patient to stop taking NSAIDS and blood thinners 5 days prior to surgery

## 2018-04-04 ENCOUNTER — Other Ambulatory Visit: Payer: Self-pay | Admitting: *Deleted

## 2018-04-04 NOTE — Patient Outreach (Signed)
Monica [name] [facility]) Care Management  04/04/2018  Monica [name] 1955-12-17 [id number]   Subjective:Telephone call to patient's mobile number times 2,  no answer, line busy, and unable to leave a message.   Objective:Per KPN (Knowledge Performance Now, point of care tool) and chart review,patient to be admitted 04/07/18 for [facility]. Patient also has a history of diabetes, hypertension,Atrial fibrillation,Osteoarthritis, and Tachycardia.       Assessment: Received UMR Preoperative / Transition of care referral on 03/21/18.Preoperative call follow up pending patient contact.Transition of care follow up pending notification of patient discharge.    Plan:RNCM will call patient for  telephone outreach attempt, transition of care follow up, within 3 business days of hospital discharge notification.   RNCM will send unsuccessful outreach  letter, [facility] pamphlet, and proceed with case closure, within 10 business days if no return call after 3 attempts for transition of care follow up post hospital discharge.      Bridey Brookover H. Annia Friendly, BSN, [place] Management [facility] Telephonic CM Phone: [phone] Fax: [phone]

## 2018-04-07 ENCOUNTER — Encounter (HOSPITAL_COMMUNITY): Payer: Self-pay | Admitting: *Deleted

## 2018-04-07 ENCOUNTER — Inpatient Hospital Stay (HOSPITAL_COMMUNITY)
Admission: RE | Admit: 2018-04-07 | Discharge: 2018-04-09 | DRG: 470 | Disposition: A | Discharge status: Home Health | Payer: 59 | Attending: Orthopaedic Surgery | Admitting: Orthopaedic Surgery

## 2018-04-07 ENCOUNTER — Telehealth (INDEPENDENT_AMBULATORY_CARE_PROVIDER_SITE_OTHER): Payer: Self-pay | Admitting: Orthopaedic Surgery

## 2018-04-07 ENCOUNTER — Other Ambulatory Visit: Payer: Self-pay

## 2018-04-07 ENCOUNTER — Encounter (HOSPITAL_COMMUNITY)
Admission: RE | Disposition: A | Discharge status: Home Health | Payer: Self-pay | Source: Home / Self Care | Attending: Orthopaedic Surgery

## 2018-04-07 ENCOUNTER — Inpatient Hospital Stay (HOSPITAL_COMMUNITY): Payer: 59

## 2018-04-07 ENCOUNTER — Inpatient Hospital Stay (HOSPITAL_COMMUNITY): Payer: 59 | Admitting: Anesthesiology

## 2018-04-07 DIAGNOSIS — Z87891 Personal history of nicotine dependence: Secondary | ICD-10-CM

## 2018-04-07 DIAGNOSIS — I4891 Unspecified atrial fibrillation: Secondary | ICD-10-CM | POA: Diagnosis not present

## 2018-04-07 DIAGNOSIS — Z96642 Presence of left artificial hip joint: Secondary | ICD-10-CM

## 2018-04-07 DIAGNOSIS — D62 Acute posthemorrhagic anemia: Secondary | ICD-10-CM | POA: Diagnosis not present

## 2018-04-07 DIAGNOSIS — E119 Type 2 diabetes mellitus without complications: Secondary | ICD-10-CM | POA: Diagnosis present

## 2018-04-07 DIAGNOSIS — Z96641 Presence of right artificial hip joint: Secondary | ICD-10-CM | POA: Diagnosis present

## 2018-04-07 DIAGNOSIS — Z419 Encounter for procedure for purposes other than remedying health state, unspecified: Secondary | ICD-10-CM

## 2018-04-07 DIAGNOSIS — Z8249 Family history of ischemic heart disease and other diseases of the circulatory system: Secondary | ICD-10-CM | POA: Diagnosis not present

## 2018-04-07 DIAGNOSIS — G4733 Obstructive sleep apnea (adult) (pediatric): Secondary | ICD-10-CM | POA: Diagnosis not present

## 2018-04-07 DIAGNOSIS — M879 Osteonecrosis, unspecified: Secondary | ICD-10-CM | POA: Diagnosis present

## 2018-04-07 DIAGNOSIS — I10 Essential (primary) hypertension: Secondary | ICD-10-CM | POA: Diagnosis not present

## 2018-04-07 DIAGNOSIS — Z820 Family history of epilepsy and other diseases of the nervous system: Secondary | ICD-10-CM | POA: Diagnosis not present

## 2018-04-07 DIAGNOSIS — Z8349 Family history of other endocrine, nutritional and metabolic diseases: Secondary | ICD-10-CM | POA: Diagnosis not present

## 2018-04-07 DIAGNOSIS — Z833 Family history of diabetes mellitus: Secondary | ICD-10-CM

## 2018-04-07 DIAGNOSIS — Z471 Aftercare following joint replacement surgery: Secondary | ICD-10-CM | POA: Diagnosis not present

## 2018-04-07 DIAGNOSIS — Z6841 Body Mass Index (BMI) 40.0 and over, adult: Secondary | ICD-10-CM

## 2018-04-07 DIAGNOSIS — M1612 Unilateral primary osteoarthritis, left hip: Secondary | ICD-10-CM | POA: Diagnosis not present

## 2018-04-07 HISTORY — PX: TOTAL HIP ARTHROPLASTY: SHX124

## 2018-04-07 LAB — GLUCOSE, CAPILLARY: GLUCOSE-CAPILLARY: 121 mg/dL — AB (ref 70–99)

## 2018-04-07 SURGERY — ARTHROPLASTY, HIP, TOTAL, ANTERIOR APPROACH
Anesthesia: General | Site: Hip | Laterality: Left

## 2018-04-07 MED ORDER — METHOCARBAMOL 500 MG PO TABS
500.0000 mg | ORAL_TABLET | Freq: Four times a day (QID) | ORAL | Status: DC | PRN
  Administered 2018-04-07 – 2018-04-09 (×5): 500 mg via ORAL
  Filled 2018-04-07 (×6): qty 1

## 2018-04-07 MED ORDER — FENTANYL CITRATE (PF) 100 MCG/2ML IJ SOLN
INTRAMUSCULAR | Status: DC | PRN
  Administered 2018-04-07 (×4): 50 ug via INTRAVENOUS

## 2018-04-07 MED ORDER — POTASSIUM CHLORIDE CRYS ER 10 MEQ PO TBCR
10.0000 meq | EXTENDED_RELEASE_TABLET | Freq: Every evening | ORAL | Status: DC
  Administered 2018-04-07 – 2018-04-09 (×3): 10 meq via ORAL
  Filled 2018-04-07 (×3): qty 1

## 2018-04-07 MED ORDER — SUCCINYLCHOLINE CHLORIDE 200 MG/10ML IV SOSY
PREFILLED_SYRINGE | INTRAVENOUS | Status: DC | PRN
  Administered 2018-04-07: 120 mg via INTRAVENOUS

## 2018-04-07 MED ORDER — LACTATED RINGERS IV SOLN
INTRAVENOUS | Status: DC
  Administered 2018-04-07 (×2): via INTRAVENOUS

## 2018-04-07 MED ORDER — FLECAINIDE ACETATE 50 MG PO TABS
50.0000 mg | ORAL_TABLET | Freq: Two times a day (BID) | ORAL | Status: DC
  Administered 2018-04-07 – 2018-04-09 (×4): 50 mg via ORAL
  Filled 2018-04-07 (×6): qty 1

## 2018-04-07 MED ORDER — LACTATED RINGERS IV SOLN: INTRAVENOUS | Status: DC

## 2018-04-07 MED ORDER — DEXAMETHASONE SODIUM PHOSPHATE 10 MG/ML IJ SOLN
INTRAMUSCULAR | Status: AC
  Filled 2018-04-07: qty 1

## 2018-04-07 MED ORDER — TRANEXAMIC ACID 1000 MG/10ML IV SOLN
1000.0000 mg | INTRAVENOUS | Status: AC
  Administered 2018-04-07: 1000 mg via INTRAVENOUS
  Filled 2018-04-07: qty 10

## 2018-04-07 MED ORDER — PHENOL 1.4 % MT LIQD: 1.0000 | OROMUCOSAL | Status: DC | PRN

## 2018-04-07 MED ORDER — POLYETHYLENE GLYCOL 3350 17 G PO PACK: 17.0000 g | PACK | Freq: Every day | ORAL | Status: DC | PRN

## 2018-04-07 MED ORDER — MIDAZOLAM HCL 2 MG/2ML IJ SOLN
INTRAMUSCULAR | Status: AC
  Filled 2018-04-07: qty 2

## 2018-04-07 MED ORDER — LIDOCAINE 2% (20 MG/ML) 5 ML SYRINGE
INTRAMUSCULAR | Status: DC | PRN
  Administered 2018-04-07: 100 mg via INTRAVENOUS

## 2018-04-07 MED ORDER — METHOCARBAMOL 500 MG IVPB - SIMPLE MED
INTRAVENOUS | Status: AC
  Filled 2018-04-07: qty 50

## 2018-04-07 MED ORDER — ACETAMINOPHEN 325 MG PO TABS: 325.0000 mg | ORAL_TABLET | Freq: Four times a day (QID) | ORAL | Status: DC | PRN

## 2018-04-07 MED ORDER — MIDAZOLAM HCL 5 MG/5ML IJ SOLN
INTRAMUSCULAR | Status: DC | PRN
  Administered 2018-04-07: 2 mg via INTRAVENOUS

## 2018-04-07 MED ORDER — HYDROMORPHONE HCL 1 MG/ML IJ SOLN
0.5000 mg | INTRAMUSCULAR | Status: DC | PRN
  Administered 2018-04-07 (×2): 1 mg via INTRAVENOUS
  Administered 2018-04-08: 0.5 mg via INTRAVENOUS
  Filled 2018-04-07 (×3): qty 1

## 2018-04-07 MED ORDER — PANTOPRAZOLE SODIUM 40 MG PO TBEC
40.0000 mg | DELAYED_RELEASE_TABLET | Freq: Every day | ORAL | Status: DC
  Administered 2018-04-08 – 2018-04-09 (×2): 40 mg via ORAL
  Filled 2018-04-07 (×2): qty 1

## 2018-04-07 MED ORDER — LORATADINE 10 MG PO TABS
10.0000 mg | ORAL_TABLET | Freq: Every day | ORAL | Status: DC
  Administered 2018-04-08 – 2018-04-09 (×2): 10 mg via ORAL
  Filled 2018-04-07 (×2): qty 1

## 2018-04-07 MED ORDER — CHLORHEXIDINE GLUCONATE 4 % EX LIQD: 60.0000 mL | Freq: Once | CUTANEOUS | Status: DC

## 2018-04-07 MED ORDER — CALCIUM CARBONATE ANTACID 500 MG PO CHEW: 1.0000 | CHEWABLE_TABLET | Freq: Three times a day (TID) | ORAL | Status: DC | PRN

## 2018-04-07 MED ORDER — FENTANYL CITRATE (PF) 100 MCG/2ML IJ SOLN
INTRAMUSCULAR | Status: AC
  Filled 2018-04-07: qty 2

## 2018-04-07 MED ORDER — SUGAMMADEX SODIUM 500 MG/5ML IV SOLN
INTRAVENOUS | Status: AC
  Filled 2018-04-07: qty 5

## 2018-04-07 MED ORDER — ONDANSETRON HCL 4 MG/2ML IJ SOLN
INTRAMUSCULAR | Status: AC
  Filled 2018-04-07: qty 2

## 2018-04-07 MED ORDER — METOCLOPRAMIDE HCL 5 MG PO TABS: 5.0000 mg | ORAL_TABLET | Freq: Three times a day (TID) | ORAL | Status: DC | PRN

## 2018-04-07 MED ORDER — PROPOFOL 10 MG/ML IV BOLUS
INTRAVENOUS | Status: DC | PRN
  Administered 2018-04-07: 180 mg via INTRAVENOUS

## 2018-04-07 MED ORDER — OXYCODONE HCL 5 MG PO TABS
5.0000 mg | ORAL_TABLET | ORAL | Status: DC | PRN
  Administered 2018-04-07 – 2018-04-08 (×4): 10 mg via ORAL
  Filled 2018-04-07 (×6): qty 2

## 2018-04-07 MED ORDER — METOCLOPRAMIDE HCL 5 MG/ML IJ SOLN: 10.0000 mg | Freq: Once | INTRAMUSCULAR | Status: DC | PRN

## 2018-04-07 MED ORDER — VITAMIN D 1000 UNITS PO TABS
2000.0000 [IU] | ORAL_TABLET | Freq: Every evening | ORAL | Status: DC
  Administered 2018-04-07 – 2018-04-09 (×3): 2000 [IU] via ORAL
  Filled 2018-04-07 (×3): qty 2

## 2018-04-07 MED ORDER — SODIUM CHLORIDE 0.9 % IR SOLN
Status: DC | PRN
  Administered 2018-04-07: 1000 mL

## 2018-04-07 MED ORDER — ONDANSETRON HCL 4 MG/2ML IJ SOLN
4.0000 mg | Freq: Four times a day (QID) | INTRAMUSCULAR | Status: DC | PRN
  Administered 2018-04-07: 4 mg via INTRAVENOUS
  Filled 2018-04-07: qty 2

## 2018-04-07 MED ORDER — DOCUSATE SODIUM 100 MG PO CAPS
100.0000 mg | ORAL_CAPSULE | Freq: Two times a day (BID) | ORAL | Status: DC
  Administered 2018-04-07 – 2018-04-09 (×3): 100 mg via ORAL
  Filled 2018-04-07 (×4): qty 1

## 2018-04-07 MED ORDER — DEXAMETHASONE SODIUM PHOSPHATE 10 MG/ML IJ SOLN
INTRAMUSCULAR | Status: DC | PRN
  Administered 2018-04-07: 10 mg via INTRAVENOUS

## 2018-04-07 MED ORDER — SODIUM CHLORIDE 0.9 % IV SOLN
INTRAVENOUS | Status: DC
  Administered 2018-04-07 – 2018-04-08 (×2): via INTRAVENOUS

## 2018-04-07 MED ORDER — CEFAZOLIN SODIUM-DEXTROSE 2-4 GM/100ML-% IV SOLN
2.0000 g | INTRAVENOUS | Status: AC
  Administered 2018-04-07: 2 g via INTRAVENOUS
  Filled 2018-04-07: qty 100

## 2018-04-07 MED ORDER — SUGAMMADEX SODIUM 500 MG/5ML IV SOLN
INTRAVENOUS | Status: DC | PRN
  Administered 2018-04-07: 300 mg via INTRAVENOUS

## 2018-04-07 MED ORDER — OXYCODONE HCL 5 MG PO TABS
10.0000 mg | ORAL_TABLET | ORAL | Status: DC | PRN
  Administered 2018-04-08 – 2018-04-09 (×6): 10 mg via ORAL
  Filled 2018-04-07: qty 2
  Filled 2018-04-07: qty 3
  Filled 2018-04-07 (×2): qty 2

## 2018-04-07 MED ORDER — RIVAROXABAN 10 MG PO TABS
20.0000 mg | ORAL_TABLET | Freq: Every day | ORAL | Status: DC
  Administered 2018-04-08 – 2018-04-09 (×2): 20 mg via ORAL
  Filled 2018-04-07 (×2): qty 2

## 2018-04-07 MED ORDER — MEPERIDINE HCL 50 MG/ML IJ SOLN: 6.2500 mg | INTRAMUSCULAR | Status: DC | PRN

## 2018-04-07 MED ORDER — CEFAZOLIN SODIUM-DEXTROSE 2-4 GM/100ML-% IV SOLN
2.0000 g | Freq: Four times a day (QID) | INTRAVENOUS | Status: AC
  Administered 2018-04-07 (×2): 2 g via INTRAVENOUS
  Filled 2018-04-07 (×2): qty 100

## 2018-04-07 MED ORDER — FENTANYL CITRATE (PF) 100 MCG/2ML IJ SOLN
25.0000 ug | INTRAMUSCULAR | Status: DC | PRN
  Administered 2018-04-07: 50 ug via INTRAVENOUS

## 2018-04-07 MED ORDER — HYDROCHLOROTHIAZIDE 25 MG PO TABS
25.0000 mg | ORAL_TABLET | Freq: Every evening | ORAL | Status: DC
  Administered 2018-04-07 – 2018-04-09 (×3): 25 mg via ORAL
  Filled 2018-04-07 (×3): qty 1

## 2018-04-07 MED ORDER — METHOCARBAMOL 500 MG IVPB - SIMPLE MED
500.0000 mg | Freq: Four times a day (QID) | INTRAVENOUS | Status: DC | PRN
  Administered 2018-04-07: 500 mg via INTRAVENOUS
  Filled 2018-04-07: qty 50

## 2018-04-07 MED ORDER — ADULT MULTIVITAMIN W/MINERALS CH
1.0000 | ORAL_TABLET | Freq: Every day | ORAL | Status: DC
  Administered 2018-04-08 – 2018-04-09 (×2): 1 via ORAL
  Filled 2018-04-07 (×2): qty 1

## 2018-04-07 MED ORDER — ALUM & MAG HYDROXIDE-SIMETH 200-200-20 MG/5ML PO SUSP: 30.0000 mL | ORAL | Status: DC | PRN

## 2018-04-07 MED ORDER — PROPOFOL 10 MG/ML IV BOLUS
INTRAVENOUS | Status: AC
  Filled 2018-04-07: qty 40

## 2018-04-07 MED ORDER — ONDANSETRON HCL 4 MG/2ML IJ SOLN
INTRAMUSCULAR | Status: DC | PRN
  Administered 2018-04-07: 4 mg via INTRAVENOUS

## 2018-04-07 MED ORDER — ROCURONIUM BROMIDE 10 MG/ML (PF) SYRINGE
PREFILLED_SYRINGE | INTRAVENOUS | Status: DC | PRN
  Administered 2018-04-07: 50 mg via INTRAVENOUS

## 2018-04-07 MED ORDER — METOCLOPRAMIDE HCL 5 MG/ML IJ SOLN: 5.0000 mg | Freq: Three times a day (TID) | INTRAMUSCULAR | Status: DC | PRN

## 2018-04-07 MED ORDER — NEBIVOLOL HCL 5 MG PO TABS
5.0000 mg | ORAL_TABLET | Freq: Every evening | ORAL | Status: DC
  Administered 2018-04-07 – 2018-04-09 (×3): 5 mg via ORAL
  Filled 2018-04-07 (×3): qty 1

## 2018-04-07 MED ORDER — ONDANSETRON HCL 4 MG PO TABS: 4.0000 mg | ORAL_TABLET | Freq: Four times a day (QID) | ORAL | Status: DC | PRN

## 2018-04-07 MED ORDER — FOLIC ACID 1 MG PO TABS
2.0000 mg | ORAL_TABLET | Freq: Every evening | ORAL | Status: DC
  Administered 2018-04-07 – 2018-04-09 (×3): 2 mg via ORAL
  Filled 2018-04-07 (×3): qty 2

## 2018-04-07 MED ORDER — MENTHOL 3 MG MT LOZG: 1.0000 | LOZENGE | OROMUCOSAL | Status: DC | PRN

## 2018-04-07 MED ORDER — SERTRALINE HCL 50 MG PO TABS
50.0000 mg | ORAL_TABLET | Freq: Every evening | ORAL | Status: DC
  Administered 2018-04-07 – 2018-04-09 (×3): 50 mg via ORAL
  Filled 2018-04-07 (×3): qty 1

## 2018-04-07 MED ORDER — ZOLPIDEM TARTRATE 5 MG PO TABS: 5.0000 mg | ORAL_TABLET | Freq: Every evening | ORAL | Status: DC | PRN

## 2018-04-07 SURGICAL SUPPLY — 33 items
BLADE SAW SGTL 18X1.27X75 (BLADE) ×2 IMPLANT
COVER PERINEAL POST (MISCELLANEOUS) ×2 IMPLANT
COVER SURGICAL LIGHT HANDLE (MISCELLANEOUS) ×2 IMPLANT
CUP SECTOR GRIPTON 50MM (Cup) ×2 IMPLANT
DRAPE STERI IOBAN 125X83 (DRAPES) ×2 IMPLANT
DRAPE U-SHAPE 47X51 STRL (DRAPES) ×4 IMPLANT
DRESSING AQUACEL AG SP 3.5X10 (GAUZE/BANDAGES/DRESSINGS) ×1 IMPLANT
DRSG AQUACEL AG ADV 3.5X10 (GAUZE/BANDAGES/DRESSINGS) ×2 IMPLANT
DRSG AQUACEL AG SP 3.5X10 (GAUZE/BANDAGES/DRESSINGS) ×2
DURAPREP 26ML APPLICATOR (WOUND CARE) ×2 IMPLANT
ELECT REM PT RETURN 15FT ADLT (MISCELLANEOUS) ×2 IMPLANT
GAUZE XEROFORM 1X8 LF (GAUZE/BANDAGES/DRESSINGS) ×2 IMPLANT
GLOVE BIO SURGEON STRL SZ7.5 (GLOVE) ×2 IMPLANT
GLOVE BIOGEL PI IND STRL 8 (GLOVE) ×2 IMPLANT
GLOVE BIOGEL PI INDICATOR 8 (GLOVE) ×2
GLOVE ECLIPSE 8.0 STRL XLNG CF (GLOVE) ×2 IMPLANT
GOWN STRL REUS W/TWL XL LVL3 (GOWN DISPOSABLE) ×4 IMPLANT
HANDPIECE INTERPULSE COAX TIP (DISPOSABLE) ×1
HEAD FEMORAL 32 CERAMIC (Hips) ×2 IMPLANT
HOLDER FOLEY CATH W/STRAP (MISCELLANEOUS) ×2 IMPLANT
LINER ACET PNNCL PLUS4 NEUTRAL (Hips) ×1 IMPLANT
PACK ANTERIOR HIP CUSTOM (KITS) ×2 IMPLANT
PINNACLE PLUS 4 NEUTRAL (Hips) ×2 IMPLANT
SET HNDPC FAN SPRY TIP SCT (DISPOSABLE) ×1 IMPLANT
STEM CORAIL KLA10 (Stem) ×2 IMPLANT
STRIP CLOSURE SKIN 1/2X4 (GAUZE/BANDAGES/DRESSINGS) IMPLANT
SUT ETHIBOND NAB CT1 #1 30IN (SUTURE) ×2 IMPLANT
SUT VIC AB 0 CT1 36 (SUTURE) ×2 IMPLANT
SUT VIC AB 1 CT1 36 (SUTURE) ×2 IMPLANT
SUT VIC AB 2-0 CT1 27 (SUTURE) ×2
SUT VIC AB 2-0 CT1 TAPERPNT 27 (SUTURE) ×2 IMPLANT
TRAY FOLEY MTR SLVR 16FR STAT (SET/KITS/TRAYS/PACK) ×2 IMPLANT
YANKAUER SUCT BULB TIP 10FT TU (MISCELLANEOUS) ×2 IMPLANT

## 2018-04-07 NOTE — Telephone Encounter (Signed)
The Xarelto should be started tomorrow and not today. Thanks

## 2018-04-07 NOTE — Op Note (Signed)
NAME: Monica [name], [place] MEDICAL RECORD [id number] ACCOUNT [id number] DATE OF BIRTH:[birth year] FACILITY: [facility] LOCATION: [id number] PHYSICIAN:Jaycie Kregel [place], MD  OPERATIVE REPORT  DATE OF PROCEDURE:  04/07/2018  PREOPERATIVE DIAGNOSIS:  Avascular necrosis combined with osteoarthritis, left hip.  POSTOPERATIVE DIAGNOSIS:  Avascular necrosis combined with osteoarthritis, left hip.  PROCEDURE:  Left total hip arthroplasty through direct anterior approach.  IMPLANTS:  DePuy Sector Gription acetabular component size 50, size 32+4 polyethylene liner, size 10 Corail femoral component with varus offset, size 32+1 ceramic hip ball.  SURGEON:  Lind Guest. [name], MD  ASSISTANTDrema Balzarine, RNFA  ESTIMATED BLOOD LOSS:  250 mL.  ANTIBIOTICS:  3 g IV Ancef.  COMPLICATIONS:  None.  INDICATIONS:  The patient is a very pleasant 62 year old morbidly obese female well known to me.  She has osteonecrosis and osteoarthritis of her left hip.  She actually had a right total hip arthroplasty 5 years ago by one of the other surgeons in town.   She was hoping to have her left hip replaced, but then that surgeon felt that she was too significantly obese at this point to consider surgery, although she has lost 30 pounds.  I did see her in the office and told her about anterior hip surgery.  I  laid her supine on the exam table and felt comfortable with going through the front of her hip as far as her anatomy.  She is diabetic, but under excellent control.  She is a nonsmoker.  At 87, she understands fully the risk of acute blood loss anemia,  nerve or vessel injury, fracture, infection, dislocation, DVT.  She understands all these risks are heightened given her morbid obesity.  She understands her goals are to decrease pain, improve mobility and overall improve quality of life.  DESCRIPTION OF PROCEDURE:  After informed consent was obtained and appropriate left hip was marked, she was brought to the  operating room where general anesthesia was obtained while she was on a stretcher.  Foley catheter was placed.  I assessed her leg  lengths and found them to be equal.  I placed traction boots on both her feet and then placed her supine on the Hana fracture table with a perineal post in place and both legs in in-line skeletal traction device with no traction applied.  We had to  mobilize her abdomen and prepped it out of the field and then prepped her left hip with DuraPrep and sterile drapes.  A time-out was called to identify correct patient, correct left hip.  We then made an incision just inferior and posterior to the  anterior superior iliac spine and carried this obliquely down the leg.  I dissected down the tensor fascial lata muscle, and the tensor fascia was then divided longitudinally to proceed with direct anterior approach to the hip.  I identified and  cauterized the circumflex vessels.  I identified the hip capsule, placed a Cobra retractor around the medial and lateral femoral neck.  I then opened up the hip capsule in L-type format, finding moderate joint effusion.  I placed Cobra retractors around  the medial and lateral femoral neck and then made our femoral neck cut with an oscillating saw and completed this with an osteotome.  I placed a corkscrew guide in the femoral head and removed the femoral head in its entirety and found it to have a large  area where the cartilage was flaking off, consistent more so with osteonecrosis than osteoarthritis.  I then placed a bent  Hohmann over the medial acetabular rim and removed remnants of the acetabular labrum and other debris.  I then began reaming under  direct visualization and direct fluoroscopy from a size 43 reamer, going up to a size 49 with all reamers under direct visualization, the last reamer under direct fluoroscopy so I could obtain my depth of reaming by inclination and anteversion.  Once I  was pleased with this, I placed the real  DePuy Sector Gription acetabular component size 50 and a 32+4 polyethylene liner for that size acetabular component.  Attention was then turned to the femur.  With the leg externally rotated to 120 degrees,  extended and adducted, I placed a Mueller retractor medially and a Hohmann retractor behind the greater trochanter.  I released the lateral joint capsule and used a box-cutting osteotome to enter the femoral canal and a rongeur to lateralize.  I then  began broaching from a size 8 broach using Corail broaching system going up to a size 10, and this did fill the canal, and she has significant bowing as well with a varus offset femoral neck.  We brought the leg back over and up.  We put a 32+1 hip ball  ____.  This was reduced in the acetabulum, and I was pleased with her offset and leg length as well as her range of motion and stability.  We then dislocated the hip and removed the trial components.  We then placed the real Corail femoral component size  10 with varus offset and the real 32+1 ceramic hip ball and again reduced this in the acetabulum, and I appreciated stability and placed under direct fluoroscopy as well.  I then irrigated the soft tissue with normal saline solution using pulsatile  lavage.  I closed the joint capsule with interrupted #1 Ethibond suture.  The tensor fascia was closed with a running #1 Vicryl, followed by 0 Vicryl in the deep tissue, 2-0 Vicryl subcutaneous tissue and interrupted staples on the skin.  Xeroform and  Aquacel dressing was applied.  She was taken off the Hana table, awakened, extubated, and taken to recovery room in stable condition.  All final counts were correct.  There were no complications noted.  LN/NUANCE  D:04/07/2018 T:04/07/2018 JOB:[id number]

## 2018-04-07 NOTE — Progress Notes (Signed)
Saranotified ppt will be in 1329 in 20 minutes

## 2018-04-07 NOTE — Anesthesia Preprocedure Evaluation (Signed)
Anesthesia Evaluation  Patient identified by MRN, date of birth, ID band Patient awake    Reviewed: Allergy & Precautions, NPO status , Patient's Chart, lab work & pertinent test results  Airway Mallampati: II  TM Distance: >3 FB Neck ROM: Full    Dental no notable dental hx.    Pulmonary neg pulmonary ROS, sleep apnea , former smoker,    Pulmonary exam normal breath sounds clear to auscultation       Cardiovascular hypertension, Pt. on medications negative cardio ROS Normal cardiovascular exam+ dysrhythmias Atrial Fibrillation  Rhythm:Regular Rate:Normal     Neuro/Psych negative neurological ROS  negative psych ROS   GI/Hepatic negative GI ROS, Neg liver ROS,   Endo/Other  Morbid obesity  Renal/GU negative Renal ROS  negative genitourinary   Musculoskeletal negative musculoskeletal ROS (+)   Abdominal   Peds negative pediatric ROS (+)  Hematology negative hematology ROS (+)   Anesthesia Other Findings   Reproductive/Obstetrics negative OB ROS                             Anesthesia Physical Anesthesia Plan  ASA: III  Anesthesia Plan: General   Post-op Pain Management:    Induction: Intravenous  PONV Risk Score and Plan: 3 and Ondansetron and Treatment may vary due to age or medical condition  Airway Management Planned: Oral ETT  Additional Equipment:   Intra-op Plan:   Post-operative Plan: Extubation in OR  Informed Consent: I have reviewed the patients History and Physical, chart, labs and discussed the procedure including the risks, benefits and alternatives for the proposed anesthesia with the patient or authorized representative who has indicated his/her understanding and acceptance.   Dental advisory given  Plan Discussed with: CRNA  Anesthesia Plan Comments:         Anesthesia Quick Evaluation

## 2018-04-07 NOTE — Telephone Encounter (Signed)
Mary, Pharmacist at [facility] called stating that it looks like the patient's full dose of Xarelto is scheduled to continue at 1700 today.  She would like to know if you want that started then or tomorrow, day one of post op.  CB#[phone].  Thank you.

## 2018-04-07 NOTE — Evaluation (Signed)
Physical Therapy Evaluation Patient Details Name: Monica [name] MRN: [id number] DOB: 1956-05-27 Today's Date: 04/07/2018   History of Present Illness  Pt s/p L THR and with hx of A-fib and R THR by posterior approach in 2013  Clinical Impression  Pt s/p L THR and presents with decreased L LE strength/ROM, post op pain and obesity limiting functional mobility.  Pt should progress to dc home with family assist.    Follow Up Recommendations Home health PT    Equipment Recommendations  None recommended by PT    Recommendations for Other Services OT consult     Precautions / Restrictions Precautions Precautions: Fall Restrictions Weight Bearing Restrictions: No Other Position/Activity Restrictions: WBAT      Mobility  Bed Mobility Overal bed mobility: Needs Assistance Bed Mobility: Supine to Sit     Supine to sit: +2 for physical assistance;Min assist;Mod assist     General bed mobility comments: Increased time with cues for sequence and use of R LE to self assist  Transfers Overall transfer level: Needs assistance Equipment used: Rolling walker (2 wheeled) Transfers: Sit to/from Stand Sit to Stand: Min assist;+2 physical assistance;From elevated surface         General transfer comment: cues for LE management and use of UEs to self assist  Ambulation/Gait Ambulation/Gait assistance: Min assist;+2 physical assistance;+2 safety/equipment Gait Distance (Feet): 18 Feet Assistive device: Rolling walker (2 wheeled) Gait Pattern/deviations: Step-to pattern;Decreased step length - right;Decreased step length - left;Shuffle;Trunk flexed Gait velocity: decr   General Gait Details: cues for sequence, posture and position from [employer]            Wheelchair Mobility    Modified Rankin (Stroke Patients Only)       Balance Overall balance assessment: Needs assistance Sitting-balance support: No upper extremity supported;Feet supported Sitting balance-Leahy  Scale: Good     Standing balance support: Bilateral upper extremity supported Standing balance-Leahy Scale: Poor                               Pertinent Vitals/Pain Pain Assessment: 0-10 Pain Score: 6  Pain Location: L hip Pain Descriptors / Indicators: Aching;Sore Pain Intervention(s): Limited activity within patient's tolerance;Monitored during session;Premedicated before session;Ice applied    Home Living Family/patient expects to be discharged to:: Private residence Living Arrangements: Spouse/significant other Available Help at Discharge: Family Type of Home: House Home Access: Level entry     Home Layout: Two level;Able to live on main level with bedroom/bathroom Home Equipment: Gilford Rile - 2 wheels      Prior Function Level of Independence: Independent               Hand Dominance        Extremity/Trunk Assessment   Upper Extremity Assessment Upper Extremity Assessment: Overall WFL for tasks assessed    Lower Extremity Assessment Lower Extremity Assessment: LLE deficits/detail       Communication   Communication: No difficulties  Cognition Arousal/Alertness: Awake/alert Behavior During Therapy: WFL for tasks assessed/performed Overall Cognitive Status: Within Functional Limits for tasks assessed                                        General Comments      Exercises Total Joint Exercises Ankle Circles/Pumps: AROM;Both;15 reps;Supine   Assessment/Plan    PT Assessment Patient needs  continued PT services  PT Problem List Decreased strength;Decreased range of motion;Decreased activity tolerance;Decreased mobility;Decreased knowledge of use of DME;Pain;Obesity       PT Treatment Interventions DME instruction;Gait training;Stair training;Functional mobility training;Therapeutic activities;Therapeutic exercise;Patient/family education    PT Goals (Current goals can be found in the Care Plan section)  Acute Rehab PT  Goals Patient Stated Goal: Regain IND PT Goal Formulation: With patient Time For Goal Achievement: 04/14/18 Potential to Achieve Goals: Good    Frequency 7X/week   Barriers to discharge        Co-evaluation               AM-PAC PT "6 Clicks" Daily Activity  Outcome Measure Difficulty turning over in bed (including adjusting bedclothes, sheets and blankets)?: Unable Difficulty moving from lying on back to sitting on the side of the bed? : Unable Difficulty sitting down on and standing up from a chair with arms (e.g., wheelchair, bedside commode, etc,.)?: Unable Help needed moving to and from a bed to chair (including a wheelchair)?: A Lot Help needed walking in hospital room?: A Lot Help needed climbing 3-5 steps with a railing? : A Lot 6 Click Score: 9    End of Session Equipment Utilized During Treatment: Gait belt Activity Tolerance: Patient tolerated treatment well Patient left: in chair;with call bell/phone within reach;with family/visitor present Nurse Communication: Mobility status PT Visit Diagnosis: Difficulty in walking, not elsewhere classified (R26.2)    Time: 0165-5374 PT Time Calculation (min) (ACUTE ONLY): 27 min   Charges:   PT Evaluation $PT Eval Low Complexity: 1 Low PT Treatments $Gait Training: 8-22 mins        Pg [phone]   Tatelyn Vanhecke 04/07/2018, 4:36 PM

## 2018-04-07 NOTE — Anesthesia Postprocedure Evaluation (Signed)
Anesthesia Post Note  Patient: Monica [name]  Procedure(s) Performed: LEFT TOTAL HIP ARTHROPLASTY ANTERIOR APPROACH (Left Hip)     Patient location during evaluation: PACU Anesthesia Type: General Level of consciousness: awake and alert Pain management: pain level controlled Vital Signs Assessment: post-procedure vital signs reviewed and stable Respiratory status: spontaneous breathing, nonlabored ventilation, respiratory function stable and patient connected to nasal cannula oxygen Cardiovascular status: blood pressure returned to baseline and stable Postop Assessment: no apparent nausea or vomiting Anesthetic complications: no    Last Vitals:  Vitals:   04/07/18 1224 04/07/18 1314  BP: 132/69 (!) 142/80  Pulse: 75 76  Resp:    Temp: 36.6 C 36.8 C  SpO2: 99% 98%    Last Pain:  Vitals:   04/07/18 1314  TempSrc: Oral  PainSc:                  Montez Hageman

## 2018-04-07 NOTE — Transfer of Care (Signed)
Immediate Anesthesia Transfer of Care Note  Patient: Monica [name]  Procedure(s) Performed: LEFT TOTAL HIP ARTHROPLASTY ANTERIOR APPROACH (Left Hip)  Patient Location: PACU  Anesthesia Type:General  Level of Consciousness: sedated  Airway & Oxygen Therapy: Patient Spontanous Breathing and Patient connected to face mask oxygen  Post-op Assessment: Report given to RN and Post -op Vital signs reviewed and stable  Post vital signs: Reviewed and stable  Last Vitals:  Vitals Value Taken Time  BP    Temp    Pulse 75 04/07/2018  9:18 AM  Resp 15 04/07/2018  9:18 AM  SpO2 91 % 04/07/2018  9:18 AM  Vitals shown include unvalidated device data.  Last Pain:  Vitals:   04/07/18 0554  TempSrc:   PainSc: 0-No pain      Patients Stated Pain Goal: 3 (03/06/56 5051)  Complications: No apparent anesthesia complications

## 2018-04-07 NOTE — Telephone Encounter (Signed)
Please advise 

## 2018-04-07 NOTE — Anesthesia Procedure Notes (Signed)
Procedure Name: Intubation Date/Time: 04/07/2018 7:15 AM Performed by: Lind Covert, CRNA Pre-anesthesia Checklist: Patient identified, Emergency Drugs available, Suction available, Patient being monitored and Timeout performed Patient Re-evaluated:Patient Re-evaluated prior to induction Oxygen Delivery Method: Circle system utilized Preoxygenation: Pre-oxygenation with 100% oxygen Induction Type: IV induction Ventilation: Mask ventilation without difficulty Laryngoscope Size: Mac and 4 Grade View: Grade I Tube type: Oral Tube size: 7.5 mm Number of attempts: 1 Airway Equipment and Method: Stylet Placement Confirmation: ETT inserted through vocal cords under direct vision,  positive ETCO2 and breath sounds checked- equal and bilateral Secured at: 22 cm Tube secured with: Tape Dental Injury: Teeth and Oropharynx as per pre-operative assessment

## 2018-04-07 NOTE — Brief Op Note (Signed)
04/07/2018  8:45 AM  PATIENT:  Monica [name]  62 y.o. female  PRE-OPERATIVE DIAGNOSIS:  left hip osteoarthritis  POST-OPERATIVE DIAGNOSIS:  left hip osteoarthritis  PROCEDURE:  Procedure(s) with comments: LEFT TOTAL HIP ARTHROPLASTY ANTERIOR APPROACH (Left) - Needs RNFA  SURGEON:  Surgeon(s) and Role:    [name], MD - Primary  ASSISTANT: Leila, RNFA  ANESTHESIA:   general  EBL:  250 mL   COUNTS:  YES  DICTATION: .Other Dictation: Dictation Number [id number]  PLAN OF CARE: Admit to inpatient   PATIENT DISPOSITION:  PACU - hemodynamically stable.   Delay start of Pharmacological VTE agent (>24hrs) due to surgical blood loss or risk of bleeding: no

## 2018-04-07 NOTE — H&P (Signed)
TOTAL HIP ADMISSION H&P  Patient is admitted for left total hip arthroplasty.  Subjective:  Chief Complaint: left hip pain  HPI: Monica [name], 62 y.o. female, has a history of pain and functional disability in the left hip(s) due to arthritis and patient has failed non-surgical conservative treatments for greater than 12 weeks to include NSAID's and/or analgesics, corticosteriod injections, flexibility and strengthening excercises, use of assistive devices, weight reduction as appropriate and activity modification.  Onset of symptoms was gradual starting 3 years ago with gradually worsening course since that time.The patient noted no past surgery on the left hip(s).  Patient currently rates pain in the left hip at 10 out of 10 with activity. Patient has night pain, worsening of pain with activity and weight bearing, trendelenberg gait, pain that interfers with activities of daily living and pain with passive range of motion. Patient has evidence of subchondral cysts, subchondral sclerosis, periarticular osteophytes and joint space narrowing by imaging studies. This condition presents safety issues increasing the risk of falls.  There is no current active infection.  Patient Active Problem List   Diagnosis Date Noted  . Unilateral primary osteoarthritis, left hip 02/23/2018  . 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 History of total hip replacement, right 11/17/2017  . Chronic anticoagulation 11/19/2016  . Tinnitus 11/19/2016  . Right knee pain 07/06/2016  . Left hip pain 10/21/2015  . Gastroesophageal reflux disease 09/19/2015  . Itchy eyes 03/12/2015  . Allergic conjunctivitis 03/12/2015  . OSA (obstructive sleep apnea) 02/20/2015  . Morbid obesity ([place]) 01/15/2015  . Lymphedema 06/19/2014  . Personal history of colonic polyps - large hyperplastic 12/25/2013  . Skin infection 10/14/2013  . Candidiasis of female genitalia 10/14/2013  . IFG (impaired fasting glucose) 10/14/2013  .  Carpal tunnel syndrome 10/14/2013  . Cough 06/20/2013  . Allergic rhinitis, cause unspecified 06/20/2013  . Backache 06/05/2013  . Edema 12/20/2012  . Swelling of limb 11/30/2012  . Essential hypertension, benign 11/30/2012  . Headache(784.0) 11/30/2012  . Avascular necrosis of hip ([place]) 07/21/2012  . Atrial fibrillation ([place]) 07/06/2012  . Hypertension 09/14/2011  . Psoriasis 08/16/2005   Past Medical History:  Diagnosis Date  . Atrial fibrillation ([place])    a. s/p DCCV in 12/2012 b. repeat DCCV in 09/2014 - started on Flecainide  . Avascular necrosis of bone of right hip ([place])   . Dysrhythmia    a fib  . Hypertension   . Morbid obesity ([place])   . Osteoarthritis   . Personal history of colonic polyps - large hyperplastic 12/25/2013  . Pre-diabetes   . Psoriasis    active breakout left buttocks  . Right hip pain   . Shortness of breath   . Sleep apnea    uses mouth guard only  . Tachycardia, unspecified     Past Surgical History:  Procedure Laterality Date  . CARDIOVERSION N/A 01/09/2013   Procedure: CARDIOVERSION;  Surgeon: [name], MD;  Location: [facility] ENDOSCOPY;  Service: Cardiovascular;  Laterality: N/A;  . CARDIOVERSION N/A 10/07/2014   Procedure: CARDIOVERSION;  Surgeon: [name], MD;  Location: [facility] ENDOSCOPY;  Service: Cardiovascular;  Laterality: N/A;  09:00 Lido 60mg ,IV followed by Propofol  70mg /IV    synched electrocardioversion at 120 joules for Afib,repeated at 200 joules, 70 mg...successfully changed to SR  . FRACTURE SURGERY  07/21/12   right hip arthroplasty  . JOINT REPLACEMENT     Left hip Dr. [name] 04-07-18  . TOTAL HIP ARTHROPLASTY  07/21/2012  Procedure: TOTAL HIP ARTHROPLASTY;  Surgeon: [name], MD;  Location: [facility] ORS;  Service: Orthopedics;  Laterality: Right;    Current Facility-Administered Medications  Medication Dose Route Frequency Provider Last Rate Last Dose  . ceFAZolin (ANCEF) IVPB 2g/100 mL premix  2 g Intravenous On  Call to OR [name], MD      . chlorhexidine (HIBICLENS) 4 % liquid 4 application  60 mL Topical Once Pete Pelt, PA-C      . lactated ringers infusion   Intravenous Continuous Montez Hageman, MD 50 mL/hr at 04/07/18 [phone]    . tranexamic acid (CYKLOKAPRON) 1,000 mg in sodium chloride 0.9 % 100 mL IVPB  1,000 mg Intravenous To OR Pete Pelt, PA-C       No Known Allergies  Social History   Tobacco Use  . Smoking status: Former Smoker    Types: Cigarettes    Last attempt to quit: 09/13/1982    Years since quitting: 35.5  . Smokeless tobacco: Never Used  Substance Use Topics  . Alcohol use: Yes    Alcohol/week: 0.0 standard drinks    Comment: Occasionally    Family History  Problem Relation Age of Onset  . Hypertension Mother   . Diabetes Mother   . Alzheimer's disease Mother   . Thyroid disease Mother   . Diabetes Son      Review of Systems  Musculoskeletal: Positive for joint pain.  All other systems reviewed and are negative.   Objective:  Physical Exam  Constitutional: She is oriented to person, place, and time. She appears well-developed and well-nourished.  HENT:  Head: Normocephalic and atraumatic.  Eyes: Pupils are equal, round, and reactive to light. EOM are normal.  Neck: Normal range of motion. Neck supple.  Cardiovascular: Normal rate and regular rhythm.  Respiratory: Effort normal and breath sounds normal.  GI: Soft. Bowel sounds are normal.  Musculoskeletal:       Left hip: She exhibits decreased range of motion, decreased strength, tenderness and bony tenderness.  Neurological: She is alert and oriented to person, place, and time.  Skin: Skin is warm and dry.  Psychiatric: She has a normal mood and affect.    Vital signs in last 24 hours: Temp:  [98.2 F (36.8 C)] 98.2 F (36.8 C) (08/23 0546) Pulse Rate:  [72] 72 (08/23 0546) Resp:  [18] 18 (08/23 0546) BP: (139)/(85) 139/85 (08/23 0546) SpO2:  [98 %] 98 % (08/23  0546) Weight:  [114.8 kg] 114.8 kg (08/23 0554)  Labs:   Estimated body mass index is 43.43 kg/m as calculated from the following:   Height as of this encounter: 5\' 4"  (1.626 m).   Weight as of this encounter: 114.8 kg.   Imaging Review Plain radiographs demonstrate severe degenerative joint disease of the left hip(s). The bone quality appears to be good for age and reported activity level.    Preoperative templating of the joint replacement has been completed, documented, and submitted to the Operating Room personnel in order to optimize intra-operative equipment management.     Assessment/Plan:  End stage arthritis, left hip(s)  The patient history, physical examination, clinical judgement of the provider and imaging studies are consistent with end stage degenerative joint disease of the left hip(s) and total hip arthroplasty is deemed medically necessary. The treatment options including medical management, injection therapy, arthroscopy and arthroplasty were discussed at length. The risks and benefits of total hip arthroplasty were presented and reviewed. The risks due to aseptic  loosening, infection, stiffness, dislocation/subluxation,  thromboembolic complications and other imponderables were discussed.  The patient acknowledged the explanation, agreed to proceed with the plan and consent was signed. Patient is being admitted for inpatient treatment for surgery, pain control, PT, OT, prophylactic antibiotics, VTE prophylaxis, progressive ambulation and ADL's and discharge planning.The patient is planning to be discharged home with home health services

## 2018-04-08 LAB — GLUCOSE, CAPILLARY
GLUCOSE-CAPILLARY: 110 mg/dL — AB (ref 70–99)
GLUCOSE-CAPILLARY: 121 mg/dL — AB (ref 70–99)
Glucose-Capillary: 115 mg/dL — ABNORMAL HIGH (ref 70–99)

## 2018-04-08 LAB — CBC
HCT: 29.3 % — ABNORMAL LOW (ref 36.0–46.0)
HEMOGLOBIN: 9.4 g/dL — AB (ref 12.0–15.0)
MCH: 29.5 pg (ref 26.0–34.0)
MCHC: 32.1 g/dL (ref 30.0–36.0)
MCV: 91.8 fL (ref 78.0–100.0)
Platelets: 293 10*3/uL (ref 150–400)
RBC: 3.19 MIL/uL — AB (ref 3.87–5.11)
RDW: 15 % (ref 11.5–15.5)
WBC: 6.5 10*3/uL (ref 4.0–10.5)

## 2018-04-08 LAB — BASIC METABOLIC PANEL
ANION GAP: 7 (ref 5–15)
BUN: 9 mg/dL (ref 6–20)
CHLORIDE: 103 mmol/L (ref 98–111)
CO2: 29 mmol/L (ref 22–32)
Calcium: 8.8 mg/dL — ABNORMAL LOW (ref 8.9–10.3)
Creatinine, Ser: 0.69 mg/dL (ref 0.44–1.00)
GFR calc Af Amer: >60 mL/min (ref >60)
GFR calc non Af Amer: >60 mL/min (ref >60)
GLUCOSE: 128 mg/dL — AB (ref 70–99)
POTASSIUM: 3.5 mmol/L (ref 3.5–5.1)
Sodium: 139 mmol/L (ref 135–145)

## 2018-04-08 NOTE — Progress Notes (Signed)
Physical Therapy Treatment Patient Details Name: Monica [name] MRN: [id number] DOB: Nov 16, 1955 Today's Date: 04/08/2018    History of Present Illness Pt s/p L THR and with hx of A-fib and R THR by posterior approach in 2013    PT Comments    Pt cooperative and progressing with mobility but requiring increased time for all tasks.   Follow Up Recommendations  Home health PT     Equipment Recommendations  None recommended by PT    Recommendations for Other Services OT consult     Precautions / Restrictions Precautions Precautions: Fall Restrictions Weight Bearing Restrictions: No Other Position/Activity Restrictions: WBAT    Mobility  Bed Mobility Overal bed mobility: Needs Assistance Bed Mobility: Supine to Sit     Supine to sit: Min assist;Mod assist     General bed mobility comments: Pt up in chair and requests back to same  Transfers Overall transfer level: Needs assistance Equipment used: Rolling walker (2 wheeled) Transfers: Sit to/from Stand Sit to Stand: Min assist Stand pivot transfers: Min assist       General transfer comment: cues for LE management and use of UEs to self assist; stand pvt bed to [facility]  Ambulation/Gait Ambulation/Gait assistance: Min assist;Min guard Gait Distance (Feet): 120 Feet Assistive device: Rolling walker (2 wheeled) Gait Pattern/deviations: Step-to pattern;Decreased step length - right;Decreased step length - left;Shuffle;Decreased stance time - left Gait velocity: decr   General Gait Details: Increased time with cues for sequence, posture and position from [employer]             Wheelchair Mobility    Modified Rankin (Stroke Patients Only)       Balance Overall balance assessment: Needs assistance Sitting-balance support: No upper extremity supported;Feet supported Sitting balance-Leahy Scale: Good     Standing balance support: Bilateral upper extremity supported Standing balance-Leahy Scale: Poor                               Cognition Arousal/Alertness: Awake/alert Behavior During Therapy: WFL for tasks assessed/performed Overall Cognitive Status: Within Functional Limits for tasks assessed                                        Exercises Total Joint Exercises Ankle Circles/Pumps: AROM;Both;15 reps;Supine    General Comments        Pertinent Vitals/Pain Pain Assessment: 0-10 Pain Score: 6  Pain Location: L hip Pain Descriptors / Indicators: Aching;Sore Pain Intervention(s): Limited activity within patient's tolerance;Monitored during session;Premedicated before session;Ice applied    Home Living                      Prior Function            PT Goals (current goals can now be found in the care plan section) Acute Rehab PT Goals Patient Stated Goal: Regain IND PT Goal Formulation: With patient Time For Goal Achievement: 04/14/18 Potential to Achieve Goals: Good Progress towards PT goals: Progressing toward goals    Frequency    7X/week      PT Plan Current plan remains appropriate    Co-evaluation              AM-PAC PT "6 Clicks" Daily Activity  Outcome Measure  Difficulty turning over in bed (including adjusting bedclothes, sheets and blankets)?: Unable  Difficulty moving from lying on back to sitting on the side of the bed? : Unable Difficulty sitting down on and standing up from a chair with arms (e.g., wheelchair, bedside commode, etc,.)?: Unable Help needed moving to and from a bed to chair (including a wheelchair)?: A Little Help needed walking in hospital room?: A Little Help needed climbing 3-5 steps with a railing? : A Lot 6 Click Score: 11    End of Session Equipment Utilized During Treatment: Gait belt Activity Tolerance: Patient tolerated treatment well Patient left: in chair;with call bell/phone within reach;with family/visitor present Nurse Communication: Mobility status PT Visit  Diagnosis: Difficulty in walking, not elsewhere classified (R26.2)     Time: 6803-2122 PT Time Calculation (min) (ACUTE ONLY): 31 min  Charges:  $Gait Training: 23-37 mins $Therapeutic Activity: 8-22 mins                     Pg [phone]    Marv Alfrey 04/08/2018, 1:33 PM

## 2018-04-08 NOTE — Progress Notes (Signed)
Physical Therapy Treatment Patient Details Name: Monica [name] MRN: [id number] DOB: [birth year] Today's Date: 04/08/2018    History of Present Illness Pt s/p L THR and with hx of A-fib and R THR by posterior approach in 2013    PT Comments    Pt continues cooperative and progressing steadily with mobility but with increased time required for all tasks.   Follow Up Recommendations  Home health PT     Equipment Recommendations  None recommended by PT    Recommendations for Other Services OT consult     Precautions / Restrictions Precautions Precautions: Fall Restrictions Weight Bearing Restrictions: No Other Position/Activity Restrictions: WBAT    Mobility  Bed Mobility Overal bed mobility: Needs Assistance Bed Mobility: Supine to Sit     Supine to sit: Min assist;Mod assist     General bed mobility comments: Pt up in chair and requests back to same  Transfers Overall transfer level: Needs assistance Equipment used: Rolling walker (2 wheeled) Transfers: Sit to/from Stand Sit to Stand: Min guard Stand pivot transfers: Min assist       General transfer comment: cues for LE management and use of UEs to self assist  Ambulation/Gait Ambulation/Gait assistance: Min assist;Min guard Gait Distance (Feet): 170 Feet(and 15' back from bathroom) Assistive device: Rolling walker (2 wheeled) Gait Pattern/deviations: Decreased step length - right;Decreased step length - left;Shuffle;Decreased stance time - left;Step-to pattern;Step-through pattern Gait velocity: decr   General Gait Details: Increased time with cues for sequence, posture and position from Monica [name]             Wheelchair Mobility    Modified Rankin (Stroke Patients Only)       Balance Overall balance assessment: Needs assistance Sitting-balance support: No upper extremity supported;Feet supported Sitting balance-Leahy Scale: Good     Standing balance support: Bilateral upper extremity  supported Standing balance-Leahy Scale: Fair                              Cognition Arousal/Alertness: Awake/alert Behavior During Therapy: WFL for tasks assessed/performed Overall Cognitive Status: Within Functional Limits for tasks assessed                                        Exercises Total Joint Exercises Ankle Circles/Pumps: AROM;Both;15 reps;Supine Heel Slides: AAROM;Left;20 reps;Supine    General Comments        Pertinent Vitals/Pain Pain Assessment: 0-10 Pain Score: 5  Pain Location: L hip Pain Descriptors / Indicators: Aching;Sore Pain Intervention(s): Limited activity within patient's tolerance;Monitored during session;Premedicated before session;Patient requesting pain meds-RN notified;Ice applied    Home Living                      Prior Function            PT Goals (current goals can now be found in the care plan section) Acute Rehab PT Goals Patient Stated Goal: Regain IND PT Goal Formulation: With patient Time For Goal Achievement: 04/14/18 Potential to Achieve Goals: Good Progress towards PT goals: Progressing toward goals    Frequency    7X/week      PT Plan Current plan remains appropriate    Co-evaluation              AM-PAC PT "6 Clicks" Daily Activity  Outcome Measure  Difficulty  turning over in bed (including adjusting bedclothes, sheets and blankets)?: Unable Difficulty moving from lying on back to sitting on the side of the bed? : Unable Difficulty sitting down on and standing up from a chair with arms (e.g., wheelchair, bedside commode, etc,.)?: Unable Help needed moving to and from a bed to chair (including a wheelchair)?: A Little Help needed walking in hospital room?: A Little Help needed climbing 3-5 steps with a railing? : A Lot 6 Click Score: 11    End of Session Equipment Utilized During Treatment: Gait belt Activity Tolerance: Patient tolerated treatment well Patient left:  in chair;with call bell/phone within reach;with family/visitor present Nurse Communication: Mobility status PT Visit Diagnosis: Difficulty in walking, not elsewhere classified (R26.2)     Time: 2060-1561 PT Time Calculation (min) (ACUTE ONLY): 46 min  Charges:  $Gait Training: 23-37 mins $Therapeutic Activity: 8-22 mins                     Pg [phone]    Monica [name] 04/08/2018, 4:34 PM

## 2018-04-08 NOTE — Discharge Instructions (Signed)

## 2018-04-08 NOTE — Progress Notes (Signed)
Physical Therapy Treatment Patient Details Name: Monica [name] MRN: [id number] DOB: [birth year] Today's Date: 04/08/2018    History of Present Illness Pt s/p L THR and with hx of A-fib and R THR by posterior approach in 2013    PT Comments      Follow Up Recommendations  Home health PT     Equipment Recommendations  None recommended by PT    Recommendations for Other Services OT consult     Precautions / Restrictions Precautions Precautions: Fall Restrictions Weight Bearing Restrictions: No Other Position/Activity Restrictions: WBAT    Mobility  Bed Mobility Overal bed mobility: Needs Assistance Bed Mobility: Supine to Sit     Supine to sit: Min assist;Mod assist     General bed mobility comments: cues for sequence and use of R LE to self assist  Transfers Overall transfer level: Needs assistance Equipment used: Rolling walker (2 wheeled) Transfers: Sit to/from Stand Sit to Stand: Min assist Stand pivot transfers: Min assist       General transfer comment: cues for LE management and use of UEs to self assist; stand pvt bed to [facility]  Ambulation/Gait Ambulation/Gait assistance: Min assist Gait Distance (Feet): 8 Feet Assistive device: Rolling walker (2 wheeled) Gait Pattern/deviations: Step-to pattern;Decreased step length - right;Decreased step length - left;Shuffle;Decreased stance time - left Gait velocity: decr   General Gait Details: cues for sequence, posture and position from [employer]             Wheelchair Mobility    Modified Rankin (Stroke Patients Only)       Balance Overall balance assessment: Needs assistance Sitting-balance support: No upper extremity supported;Feet supported Sitting balance-Leahy Scale: Good     Standing balance support: Bilateral upper extremity supported Standing balance-Leahy Scale: Poor                              Cognition Arousal/Alertness: Awake/alert Behavior During Therapy: WFL  for tasks assessed/performed Overall Cognitive Status: Within Functional Limits for tasks assessed                                        Exercises Total Joint Exercises Ankle Circles/Pumps: AROM;Both;15 reps;Supine    General Comments        Pertinent Vitals/Pain Pain Assessment: 0-10 Pain Score: 7  Pain Location: L hip Pain Descriptors / Indicators: Aching;Sore Pain Intervention(s): Limited activity within patient's tolerance;Monitored during session;Ice applied;Patient requesting pain meds-RN notified    Home Living                      Prior Function            PT Goals (current goals can now be found in the care plan section) Acute Rehab PT Goals Patient Stated Goal: Regain IND PT Goal Formulation: With patient Time For Goal Achievement: 04/14/18 Potential to Achieve Goals: Good Progress towards PT goals: Progressing toward goals    Frequency    7X/week      PT Plan Current plan remains appropriate    Co-evaluation              AM-PAC PT "6 Clicks" Daily Activity  Outcome Measure  Difficulty turning over in bed (including adjusting bedclothes, sheets and blankets)?: Unable Difficulty moving from lying on back to sitting on the side of the  bed? : Unable Difficulty sitting down on and standing up from a chair with arms (e.g., wheelchair, bedside commode, etc,.)?: Unable Help needed moving to and from a bed to chair (including a wheelchair)?: A Little Help needed walking in hospital room?: A Little Help needed climbing 3-5 steps with a railing? : A Lot 6 Click Score: 11    End of Session Equipment Utilized During Treatment: Gait belt Activity Tolerance: Patient tolerated treatment well Patient left: in chair;with call bell/phone within reach;with family/visitor present Nurse Communication: Mobility status PT Visit Diagnosis: Difficulty in walking, not elsewhere classified (R26.2)     Time: 3437-3578 PT Time  Calculation (min) (ACUTE ONLY): 20 min  Charges:  $Therapeutic Activity: 8-22 mins                     Pg [phone]    Kayliegh Boyers 04/08/2018, 1:30 PM

## 2018-04-08 NOTE — Care Management Note (Signed)
Case Management Note  Patient Details  Name: GENNELL HOW MRN: [id number] Date of Birth: 20-Nov-1955  Subjective/Objective: 62 yo F s/p L THR                  Action/Plan: HH and DME   Expected Discharge Date:   04/09/2018               Expected Discharge Plan:  [place]  In-House Referral:     Discharge planning Services  CM Consult  Post Acute Care Choice:  Home Health Choice offered to:  Patient  DME Arranged:  Walker rolling, 3-N-1 DME Agency:  [place]:  PT [place] Agency:  [name] (formerly [facility])  Status of Service:  Completed, signed off  If discussed at [employer] of Stay Meetings, dates discussed:    Additional Comments: Met with pt and husband. She plans to return home with the support of her husband and sister. She needs a RW and a 3-in-1 BSC. Discussed preference for a [facility] agency. Referral made to [name] prior to surgery and pt agrees with agency. Contacted Jermaine at [facility] for DME refereral.  Norina Buzzard, RN 04/08/2018, 1:20 PM

## 2018-04-08 NOTE — Plan of Care (Signed)
Patient s/p left total hip replacement anterior approach on 08/23.  Plan is for discharge home tomorrow pending no acute events overnight.  Patient tolerating po well with no complaints of n/v.  Pain well controlled with po pain meds.  Patient ambulated well with PT/OT today.  Saline locked and voiding.  Family at bedside.  Call bell within reach.  Fall precautions in place. Problem: Clinical Measurements: Goal: Ability to maintain clinical measurements within normal limits will improve Outcome: Progressing Goal: Will remain free from infection Outcome: Progressing Goal: Diagnostic test results will improve Outcome: Progressing Goal: Respiratory complications will improve Outcome: Progressing Goal: Cardiovascular complication will be avoided Outcome: Progressing   Problem: Activity: Goal: Risk for activity intolerance will decrease Outcome: Progressing   Problem: Elimination: Goal: Will not experience complications related to bowel motility Outcome: Progressing Goal: Will not experience complications related to urinary retention Outcome: Progressing   Problem: Pain Managment: Goal: General experience of comfort will improve Outcome: Progressing   Problem: Safety: Goal: Ability to remain free from injury will improve Outcome: Progressing   Problem: Education: Goal: Knowledge of the prescribed therapeutic regimen will improve Outcome: Progressing Goal: Understanding of discharge needs will improve Outcome: Progressing Goal: Individualized Educational Video(s) Outcome: Progressing   Problem: Activity: Goal: Ability to avoid complications of mobility impairment will improve Outcome: Progressing Goal: Ability to tolerate increased activity will improve Outcome: Progressing   Problem: Clinical Measurements: Goal: Postoperative complications will be avoided or minimized Outcome: Progressing   Problem: Pain Management: Goal: Pain level will decrease with appropriate  interventions Outcome: Progressing

## 2018-04-08 NOTE — Progress Notes (Signed)
Subjective: 1 Day Post-Op Procedure(s) (LRB): LEFT TOTAL HIP ARTHROPLASTY ANTERIOR APPROACH (Left) Patient reports pain as moderate.   Acute blood loss anemia from surgery, but tolerating well. Objective: Vital signs in last 24 hours: Temp:  [97.5 F (36.4 C)-98.3 F (36.8 C)] 97.5 F (36.4 C) (08/24 0551) Pulse Rate:  [66-85] 66 (08/24 0551) Resp:  [13-18] 16 (08/24 0551) BP: (116-145)/(61-81) 116/62 (08/24 0551) SpO2:  [93 %-99 %] 99 % (08/24 0551)  Intake/Output from previous day: 08/23 0701 - 08/24 0700 In: 3797.8 [P.O.:680; I.V.:2867.7; IV Piggyback:250.1] Out: 2650 [Urine:2400; Blood:250] Intake/Output this shift: Total I/O In: 100 [P.O.:100] Out: 100 [Urine:100]  Recent Labs    04/08/18 0502  HGB 9.4*   Recent Labs    04/08/18 0502  WBC 6.5  RBC 3.19*  HCT 29.3*  PLT 293   Recent Labs    04/08/18 0502  NA 139  K 3.5  CL 103  CO2 29  BUN 9  CREATININE 0.69  GLUCOSE 128*  CALCIUM 8.8*   No results for input(s): LABPT, INR in the last 72 hours.  Sensation intact distally Intact pulses distally Dorsiflexion/Plantar flexion intact Incision: scant drainage   Assessment/Plan: 1 Day Post-Op Procedure(s) (LRB): LEFT TOTAL HIP ARTHROPLASTY ANTERIOR APPROACH (Left) Up with therapy Discharge home with home health tomorrow vs Monday pending progress.    [name] 04/08/2018, 9:21 AM

## 2018-04-08 NOTE — Evaluation (Signed)
Occupational Therapy Evaluation Patient Details Name: Monica [name] MRN: [id number] DOB: 1955/09/17 Today's Date: 04/08/2018    History of Present Illness Pt s/p L THR and with hx of A-fib and R THR by posterior approach in 2013   Clinical Impression   This 62 year old female was admitted for the above. All education was completed. No further OT is needed at this time     Follow Up Recommendations  No OT follow up    Equipment Recommendations  3 in 1 bedside commode(? wide; also states she needs RW)    Recommendations for Other Services       Precautions / Restrictions Precautions Precautions: Fall Restrictions Other Position/Activity Restrictions: WBAT      Mobility Bed Mobility               General bed mobility comments: oob  Transfers   Equipment used: Rolling walker (2 wheeled)   Sit to Stand: Min assist         General transfer comment: 2 attempts to stand.  Min A to power up and stabiize. Cues for UE/LE placement    Balance                                           ADL either performed or assessed with clinical judgement   ADL Overall ADL's : Needs assistance/impaired             Lower Body Bathing: Moderate assistance;Sit to/from stand       Lower Body Dressing: Maximal assistance;Sit to/from stand   Toilet Transfer: Minimal assistance;Ambulation;RW(chair)             General ADL Comments: pt is able to perform UB adls with set up. She has AE and family will assist as needed.  Educated on sidestepping through tight spaces and general safety with RW. Pt's tub is upstairs. She will sponge bathe initially     Vision         Perception     Praxis      Pertinent Vitals/Pain Pain Location: L hip Pain Descriptors / Indicators: Aching;Sore Pain Intervention(s): Limited activity within patient's tolerance;Monitored during session;Premedicated before session;Repositioned;Ice applied     Hand Dominance      Extremity/Trunk Assessment Upper Extremity Assessment Upper Extremity Assessment: Overall WFL for tasks assessed           Communication Communication Communication: No difficulties   Cognition Arousal/Alertness: Awake/alert Behavior During Therapy: WFL for tasks assessed/performed Overall Cognitive Status: Within Functional Limits for tasks assessed                                     General Comments       Exercises     Shoulder Instructions      Home Living                     Bathroom Shower/Tub: Tub/shower unit   Bathroom Toilet: Standard         Additional Comments: has reacher and long sponge at home. Sister, husband will assist as needed      Prior Functioning/Environment Level of Independence: Independent                 OT Problem List:  OT Treatment/Interventions:      OT Goals(Current goals can be found in the care plan section) Acute Rehab OT Goals Patient Stated Goal: Regain IND OT Goal Formulation: All assessment and education complete, DC therapy  OT Frequency:     Barriers to D/C:            Co-evaluation              AM-PAC PT "6 Clicks" Daily Activity     Outcome Measure Help from another person eating meals?: None Help from another person taking care of personal grooming?: A Little Help from another person toileting, which includes using toliet, bedpan, or urinal?: A Little Help from another person bathing (including washing, rinsing, drying)?: A Lot Help from another person to put on and taking off regular upper body clothing?: A Little Help from another person to put on and taking off regular lower body clothing?: A Lot 6 Click Score: 17   End of Session    Activity Tolerance: Patient tolerated treatment well Patient left: in chair;with call bell/phone within reach;with chair alarm set;with family/visitor present  OT Visit Diagnosis: Pain Pain - Right/Left: Left Pain - part of  body: Hip                Time: 1610-9604 OT Time Calculation (min): 23 min Charges:  OT General Charges $OT Visit: 1 Visit OT Evaluation $OT Eval Low Complexity: 1 Low  Monica [name], OTR/L [phone] 04/08/2018  Monica [name] 04/08/2018, 10:14 AM

## 2018-04-09 LAB — CBC
HCT: 28.5 % — ABNORMAL LOW (ref 36.0–46.0)
Hemoglobin: 9.1 g/dL — ABNORMAL LOW (ref 12.0–15.0)
MCH: 29.5 pg (ref 26.0–34.0)
MCHC: 31.9 g/dL (ref 30.0–36.0)
MCV: 92.5 fL (ref 78.0–100.0)
PLATELETS: 247 10*3/uL (ref 150–400)
RBC: 3.08 MIL/uL — AB (ref 3.87–5.11)
RDW: 15.1 % (ref 11.5–15.5)
WBC: 7.2 10*3/uL (ref 4.0–10.5)

## 2018-04-09 LAB — GLUCOSE, CAPILLARY
GLUCOSE-CAPILLARY: 101 mg/dL — AB (ref 70–99)
GLUCOSE-CAPILLARY: 126 mg/dL — AB (ref 70–99)
Glucose-Capillary: 117 mg/dL — ABNORMAL HIGH (ref 70–99)

## 2018-04-09 MED ORDER — ONDANSETRON HCL 4 MG PO TABS: 4.0000 mg | ORAL_TABLET | Freq: Three times a day (TID) | ORAL | 0 refills | Status: DC | PRN

## 2018-04-09 MED ORDER — PROMETHAZINE HCL 25 MG PO TABS: 25.0000 mg | ORAL_TABLET | Freq: Four times a day (QID) | ORAL | 1 refills | Status: DC | PRN

## 2018-04-09 MED ORDER — SENNOSIDES-DOCUSATE SODIUM 8.6-50 MG PO TABS: 1.0000 | ORAL_TABLET | Freq: Every evening | ORAL | 1 refills | Status: DC | PRN

## 2018-04-09 MED ORDER — RIVAROXABAN 20 MG PO TABS: 20.0000 mg | ORAL_TABLET | Freq: Every day | ORAL | 0 refills | Status: DC

## 2018-04-09 MED ORDER — OXYCODONE HCL 5 MG PO TABS: 5.0000 mg | ORAL_TABLET | ORAL | 0 refills | Status: DC | PRN

## 2018-04-09 MED ORDER — OXYCODONE HCL ER 10 MG PO T12A: 10.0000 mg | EXTENDED_RELEASE_TABLET | Freq: Two times a day (BID) | ORAL | 0 refills | Status: AC

## 2018-04-09 MED ORDER — METHOCARBAMOL 750 MG PO TABS: 750.0000 mg | ORAL_TABLET | Freq: Two times a day (BID) | ORAL | 0 refills | Status: DC | PRN

## 2018-04-09 NOTE — Plan of Care (Signed)
Pt resting comfortably. Passed PT goals. Pain managed with PO meds. Voiding adequately and appetite adequate. Waiting on MD for discharge order and instructions.

## 2018-04-09 NOTE — Discharge Summary (Signed)
Physician Discharge Summary      Patient ID: Monica [name] MRN: [id number] DOB/AGE: [birth year] 62 y.o.  Admit date: 04/07/2018 Discharge date: 04/09/2018  Admission Diagnoses:  Unilateral primary osteoarthritis, left hip  Discharge Diagnoses:  Principal Problem:   Unilateral primary osteoarthritis, left hip Active Problems:   Status post total replacement of left hip   Past Medical History:  Diagnosis Date  . Atrial fibrillation ([place])    a. s/p DCCV in 12/2012 b. repeat DCCV in 09/2014 - started on Flecainide  . Avascular necrosis of bone of right hip ([place])   . Dysrhythmia    a fib  . Hypertension   . Morbid obesity ([place])   . Osteoarthritis   . Personal history of colonic polyps - large hyperplastic 12/25/2013  . Pre-diabetes   . Psoriasis    active breakout left buttocks  . Right hip pain   . Shortness of breath   . Sleep apnea    uses mouth guard only  . Tachycardia, unspecified     Surgeries: Procedure(s): LEFT TOTAL HIP ARTHROPLASTY ANTERIOR APPROACH on 04/07/2018   Consultants (if any):   Discharged Condition: Improved  Hospital Course: Monica [name] is an 62 y.o. female who was admitted 04/07/2018 with a diagnosis of Unilateral primary osteoarthritis, left hip and went to the operating room on 04/07/2018 and underwent the above named procedures.    She was given perioperative antibiotics:  Anti-infectives (From admission, onward)   Start     Dose/Rate Route Frequency Ordered Stop   04/07/18 1300  ceFAZolin (ANCEF) IVPB 2g/100 mL premix     2 g 200 mL/hr over 30 Minutes Intravenous Every 6 hours 04/07/18 1020 04/07/18 1945   04/07/18 0600  ceFAZolin (ANCEF) IVPB 2g/100 mL premix     2 g 200 mL/hr over 30 Minutes Intravenous On call to O.R. 04/07/18 4270 04/07/18 0720    .  She was given sequential compression devices, early ambulation, and aspirin for DVT prophylaxis.  She benefited maximally from the hospital stay and there were no complications.     Recent vital signs:  Vitals:   04/09/18 0403 04/09/18 1036  BP: 115/60 122/70  Pulse: 90 76  Resp: 16   Temp: 98.8 F (37.1 C) 98.6 F (37 C)  SpO2: 94% 99%    Recent laboratory studies:  Lab Results  Component Value Date   HGB 9.1 (L) 04/09/2018   HGB 9.4 (L) 04/08/2018   HGB 10.7 (L) 03/30/2018   Lab Results  Component Value Date   WBC 7.2 04/09/2018   PLT 247 04/09/2018   Lab Results  Component Value Date   INR 1.15 07/12/2012   Lab Results  Component Value Date   NA 139 04/08/2018   K 3.5 04/08/2018   CL 103 04/08/2018   CO2 29 04/08/2018   BUN 9 04/08/2018   CREATININE 0.69 04/08/2018   GLUCOSE 128 (H) 04/08/2018    Discharge Medications:   Allergies as of 04/09/2018   No Known Allergies     Medication List    TAKE these medications   acetaminophen 650 MG CR tablet Commonly known as:  TYLENOL Take 650-1,300 mg by mouth every 8 (eight) hours as needed for pain.   ARTIFICIAL TEARS 1-0.3 % Soln Generic drug:  Propylene Glycol-Glycerin Place 1 drop into both eyes 3 (three) times daily as needed (for dry eyes).   BYSTOLIC 10 MG tablet Generic drug:  nebivolol Take 5 mg by mouth every evening.   calcium  carbonate 750 MG chewable tablet Commonly known as:  TUMS EX Chew 1-3 tablets by mouth 3 (three) times daily as needed for heartburn.   cetirizine 10 MG tablet Commonly known as:  ZYRTEC Take 10 mg by mouth every evening.   clobetasol cream 0.05 % Commonly known as:  TEMOVATE Apply 1 application topically 2 (two) times daily as needed (for psoriasis).   desonide 0.05 % cream Commonly known as:  DESOWEN Apply 1 application topically 2 (two) times daily as needed (for psoriasis).   diclofenac sodium 1 % Gel Commonly known as:  VOLTAREN Apply 4 g topically 4 (four) times daily. What changed:    when to take this  reasons to take this   docusate sodium 100 MG capsule Commonly known as:  COLACE Take 100-200 mg by mouth 2 (two) times  daily as needed (for constipation.).   flecainide 50 MG tablet Commonly known as:  TAMBOCOR TAKE 1 TABLET (50 MG TOTAL) BY MOUTH 2 (TWO) TIMES DAILY. What changed:  See the new instructions.   fluticasone 50 MCG/ACT nasal spray Commonly known as:  FLONASE Place 2 sprays into both nostrils as needed. What changed:  reasons to take this   folic acid 1 MG tablet Commonly known as:  FOLVITE TAKE 2 TABLETS (2 MG TOTAL) BY MOUTH DAILY. What changed:  when to take this   hydrochlorothiazide 25 MG tablet Commonly known as:  HYDRODIURIL Take 25 mg by mouth every evening.   HYDROcodone-acetaminophen 5-325 MG tablet Commonly known as:  NORCO/VICODIN Take 1 tablet by mouth every 6 (six) hours as needed (for pain.).   LIPO-FLAVONOID PLUS PO Take 1 tablet by mouth daily as needed (for tinnitus (ear ringing supplement OTC)).   methocarbamol 750 MG tablet Commonly known as:  ROBAXIN Take 1 tablet (750 mg total) by mouth 2 (two) times daily as needed for muscle spasms.   methotrexate 2.5 MG tablet TAKE 8 TABLETS (20 MG TOTAL) BY MOUTH ONCE A WEEK. What changed:  See the new instructions.   multivitamin with minerals Tabs tablet Take 1 tablet by mouth daily.   ondansetron 4 MG tablet Commonly known as:  ZOFRAN Take 1 tablet (4 mg total) by mouth every 8 (eight) hours as needed for nausea or vomiting. What changed:    when to take this  additional instructions   ondansetron 4 MG tablet Commonly known as:  ZOFRAN Take 1-2 tablets (4-8 mg total) by mouth every 8 (eight) hours as needed for nausea or vomiting. What changed:  You were already taking a medication with the same name, and this prescription was added. Make sure you understand how and when to take each.   oxyCODONE 5 MG immediate release tablet Commonly known as:  Oxy IR/ROXICODONE Take 1-3 tablets (5-15 mg total) by mouth every 4 (four) hours as needed.   oxyCODONE 10 mg 12 hr tablet Commonly known as:  OXYCONTIN Take  1 tablet (10 mg total) by mouth every 12 (twelve) hours for 3 days.   potassium chloride 10 MEQ tablet Commonly known as:  K-DUR,KLOR-CON Take 1 tablet (10 mEq total) by mouth 2 (two) times daily. What changed:  when to take this   promethazine 25 MG tablet Commonly known as:  PHENERGAN Take 1 tablet (25 mg total) by mouth every 6 (six) hours as needed for nausea.   Secukinumab 150 MG/ML Soaj Inject 2 mLs into the skin as directed. Inject 2 ml below the skin as directed every 4 weeks What changed:  when to take this  additional instructions   senna-docusate 8.6-50 MG tablet Commonly known as:  Senokot-S Take 1 tablet by mouth at bedtime as needed.   sertraline 50 MG tablet Commonly known as:  ZOLOFT Take 50 mg by mouth every evening.   TRULICITY 1.5 WU/9.8JX Sopn Generic drug:  Dulaglutide Inject 1.5 mg into the skin once a week. Saturdays or Sundays.   Vitamin D3 2000 units Tabs Take 2,000 Units by mouth every evening.   XARELTO 20 MG Tabs tablet Generic drug:  rivaroxaban TAKE 1 TABLET (20 MG TOTAL) BY MOUTH DAILY WITH SUPPER. What changed:  Another medication with the same name was added. Make sure you understand how and when to take each.   rivaroxaban 20 MG Tabs tablet Commonly known as:  XARELTO Take 1 tablet (20 mg total) by mouth daily with supper. What changed:  You were already taking a medication with the same name, and this prescription was added. Make sure you understand how and when to take each.            Durable Medical Equipment  (From admission, onward)         Start     Ordered   04/07/18 1021  DME 3 n 1  Once     08 /23/19 1020   04/07/18 1021  DME Walker rolling  Once    Question:  Patient needs a walker to treat with the following condition  Answer:  Status post total replacement of left hip   04/07/18 1020          Diagnostic Studies: Dg Pelvis Portable  Result Date: 04/07/2018 CLINICAL DATA:  62 year old female status post  left hip replacement. EXAM: PORTABLE PELVIS 1-2 VIEWS COMPARISON:  Intraoperative images 0 828 hours today. AP pelvis 07/21/2012. FINDINGS: Portable AP supine view at 0921 hours. Chronic right hip arthroplasty. New left bipolar type hip arthroplasty, hardware appears intact and normally aligned on the AP view. No unexpected osseous changes identified. Postoperative changes to the overlying soft tissues including soft tissue gas and skin staples. Intact pelvis. Negative visible bowel gas pattern. Faint rounded calcifications in the pelvis such as might be related to uterine fibroid. IMPRESSION: New bipolar left hip arthroplasty with no adverse features. Electronically Signed   By: Genevie Ann M.D.   On: 04/07/2018 10:00   Dg C-arm 1-60 Min-no Report  Result Date: 04/07/2018 Fluoroscopy was utilized by the requesting physician.  No radiographic interpretation.   Dg Hip Operative Unilat W Or W/o Pelvis Left  Result Date: 04/07/2018 CLINICAL DATA:  Left hip arthroplasty EXAM: OPERATIVE LEFT HIP (WITH PELVIS IF PERFORMED) 1 VIEWS TECHNIQUE: Fluoroscopic spot image(s) were submitted for interpretation post-operatively. COMPARISON:  None. FINDINGS: Fluoroscopy for total left hip arthroplasty. No evidence of periprosthetic fracture. The joint is located. IMPRESSION: Fluoroscopy for left hip arthroplasty. Electronically Signed   By: Monte Fantasia M.D.   On: 04/07/2018 08:55    Disposition: Discharge disposition: 01-Home or Self Care       Discharge Instructions    Call MD / Call 911   Complete by:  As directed    If you experience chest pain or shortness of breath, CALL 911 and be transported to the hospital emergency room.  If you develope a fever above 101.5 F, pus (white drainage) or increased drainage or redness at the wound, or calf pain, call your surgeon's office.   Constipation Prevention   Complete by:  As directed    Drink plenty of  fluids.  Prune juice may be helpful.  You may use a stool  softener, such as Colace (over the counter) 100 mg twice a day.  Use MiraLax (over the counter) for constipation as needed.   Driving restrictions   Complete by:  As directed    No driving while taking narcotic pain meds.   Increase activity slowly as tolerated   Complete by:  As directed       Follow-up Information    [name], MD Follow up in 2 week(s).   Specialty:  Orthopedic Surgery Contact information: [place] [address] [phone]        [facility]   Specialty:  [place] Why:  [name] will provide home health physical therapy Contact information: [place] [address] [phone]            Signed: Eduard Roux 04/09/2018, 1:59 PM

## 2018-04-09 NOTE — Progress Notes (Signed)
Physical Therapy Treatment Patient Details Name: Monica [name] MRN: [id number] DOB: 1956/05/09 Today's Date: 04/09/2018    History of Present Illness Pt s/p L THR and with hx of A-fib and R THR by posterior approach in 2013    PT Comments    POD # 2  Assisted with AMB and stairs as pt wanted to try a few.  Sister present.  Pt given HEP handout.  Instructed on freq and use of ICE. Pt ready to D/C to home.   Follow Up Recommendations  Home health PT     Equipment Recommendations  None recommended by PT    Recommendations for Other Services       Precautions / Restrictions Precautions Precautions: Fall Restrictions Weight Bearing Restrictions: No Other Position/Activity Restrictions: WBAT    Mobility  Bed Mobility               General bed mobility comments: OOB in recliner  Transfers Overall transfer level: Needs assistance Equipment used: Rolling walker (2 wheeled) Transfers: Sit to/from Stand Sit to Stand: Supervision Stand pivot transfers: Supervision       General transfer comment: cues for LE management and use of UEs to self assist plus increased time  Ambulation/Gait Ambulation/Gait assistance: Min assist;Min guard Gait Distance (Feet): 85 Feet Assistive device: Rolling walker (2 wheeled) Gait Pattern/deviations: Decreased step length - right;Decreased step length - left;Shuffle;Decreased stance time - left;Step-to pattern;Step-through pattern Gait velocity: decr   General Gait Details: Increased time with cues for sequence, posture and position from RW   Stairs Stairs: Yes Stairs assistance: Min guard;Supervision Stair Management: One rail Left;Step to pattern;Forwards;With crutches Number of Stairs: 2 General stair comments: 25% VC's on proper sequencing and safety   Wheelchair Mobility    Modified Rankin (Stroke Patients Only)       Balance                                            Cognition  Arousal/Alertness: Awake/alert Behavior During Therapy: WFL for tasks assessed/performed Overall Cognitive Status: Within Functional Limits for tasks assessed                                        Exercises      General Comments        Pertinent Vitals/Pain Pain Assessment: 0-10 Pain Score: 3  Pain Location: L hip Pain Descriptors / Indicators: Aching;Sore Pain Intervention(s): Monitored during session;Repositioned;Ice applied    Home Living                      Prior Function            PT Goals (current goals can now be found in the care plan section) Progress towards PT goals: Progressing toward goals    Frequency    7X/week      PT Plan Current plan remains appropriate    Co-evaluation              AM-PAC PT "6 Clicks" Daily Activity  Outcome Measure  Difficulty turning over in bed (including adjusting bedclothes, sheets and blankets)?: A Little Difficulty moving from lying on back to sitting on the side of the bed? : A Little Difficulty sitting down on and standing up from a chair  with arms (e.g., wheelchair, bedside commode, etc,.)?: A Little Help needed moving to and from a bed to chair (including a wheelchair)?: A Little Help needed walking in hospital room?: A Little Help needed climbing 3-5 steps with a railing? : A Little 6 Click Score: 18    End of Session Equipment Utilized During Treatment: Gait belt Activity Tolerance: Patient tolerated treatment well Patient left: in chair;with call bell/phone within reach;with family/visitor present Nurse Communication: Mobility status PT Visit Diagnosis: Difficulty in walking, not elsewhere classified (R26.2)     Time: 1202-1228 PT Time Calculation (min) (ACUTE ONLY): 26 min  Charges:  $Gait Training: 8-22 mins $Therapeutic Activity: 8-22 mins                     Rica Koyanagi  PTA [facility]  Acute  Rehab Pager      [phone]

## 2018-04-10 ENCOUNTER — Encounter (HOSPITAL_COMMUNITY): Payer: Self-pay | Admitting: Orthopaedic Surgery

## 2018-04-10 ENCOUNTER — Other Ambulatory Visit: Payer: Self-pay | Admitting: *Deleted

## 2018-04-10 MED FILL — ONDANSETRON HCL 4 MG TABLET: 4 | 7 days supply | Qty: 40 | Fill #0

## 2018-04-10 MED FILL — oxyCODONE HCL 5 MG TABS: 5 | 3 days supply | Qty: 30 | Fill #0

## 2018-04-10 MED FILL — oxyCODONE HCL ER 10 MG T12A: 10 | 3 days supply | Qty: 6 | Fill #0

## 2018-04-10 MED FILL — PROMETHAZINE 25 MG TABLET: 25 | 8 days supply | Qty: 30 | Fill #0

## 2018-04-10 MED FILL — STOOL SOFTENER-LAXATIVE TAB: 50-8.6 | 100 days supply | Qty: 100 | Fill #0

## 2018-04-10 MED FILL — METHOCARBAMOL 750 MG TABS: 750 | 30 days supply | Qty: 60 | Fill #0

## 2018-04-10 NOTE — Patient Outreach (Addendum)
[place] [facility]) Care Management  04/10/2018  Monica [name] 17-Jun-1956 [id number]   [place] call to patient's home number, no answer, left HIPAA compliant voicemail message, and requested call back.    /Objective:Per KPN (Knowledge Performance Now, point of care tool) and chart review,patient hospitalized  04/07/18 -  04/09/18 forAvascular necrosis combined with osteoarthritis, left hip,  Status post LEFT TOTAL [facility] on 04/07/18. Patient also has a history of diabetes, hypertension,Atrial fibrillation,Osteoarthritis, and Tachycardia.       Assessment: Received UMR Preoperative / Transition of care referral on 03/21/18.Preoperative call follow up not completed due to patient unable to reach.  Transition of care follow up pending patient contact.     Plan:RNCM will send unsuccessful outreach  letter, [facility] pamphlet, will call patient for 2nd telephone outreach attempt, transition of care follow up, and proceed with case closure, within 10 business days if no return call.      Henessy Rohrer H. Annia Friendly, BSN, [place] Management [facility] Telephonic CM Phone: [phone] Fax: [phone]

## 2018-04-11 ENCOUNTER — Telehealth (INDEPENDENT_AMBULATORY_CARE_PROVIDER_SITE_OTHER): Payer: Self-pay

## 2018-04-11 ENCOUNTER — Other Ambulatory Visit: Payer: Self-pay | Admitting: Cardiology

## 2018-04-11 ENCOUNTER — Other Ambulatory Visit: Payer: 59 | Admitting: *Deleted

## 2018-04-11 MED FILL — METFORMIN HCL ER 500 MG TAB: 500 | 30 days supply | Qty: 120 | Fill #1

## 2018-04-11 MED FILL — FLECAINIDE ACETATE 50 MG TA: 50 | 90 days supply | Qty: 180 | Fill #0

## 2018-04-11 MED FILL — TRULICITY 1.5 MG/0.5 ML PEN: 1.5 | 28 days supply | Qty: 2 | Fill #2

## 2018-04-11 MED FILL — SERTRALINE HCL 50 MG TABLET: 50 | 90 days supply | Qty: 90 | Fill #1

## 2018-04-11 NOTE — Patient Outreach (Signed)
[name] [facility]) Care Management  04/11/2018  NAEVIA UNTERREINER 03/04/1956 [id number]   [place] call to patient's home number, no answer, left HIPAA compliant voicemail message, and requested call back.    /Objective:Per KPN (Knowledge Performance Now, point of care tool) and chart review,patient hospitalized  04/07/18 -  04/09/18 forAvascular necrosis combined with osteoarthritis, left hip,  Status post LEFT TOTAL [facility] on 04/07/18. Patient also has a history of diabetes, hypertension,Atrial fibrillation,Osteoarthritis,andTachycardia.     Assessment: Received UMR Preoperative / Transition of care referral on 03/21/18.Preoperative call follow up not completed due to patient unable to reach.  Transition of care follow up pending patient contact.     Plan:RNCM has sent unsuccessful outreach  letter, [facility] pamphlet, will call patient for 3rd telephone outreach attempt, transition of care follow up, and proceed with case closure, within 10 business days if no return call.     Kshawn Canal H. Annia Friendly, BSN, [place] Management [facility] Telephonic CM Phone: [phone] Fax: [phone]

## 2018-04-11 NOTE — Telephone Encounter (Signed)
Patient called stated that she was discharged from the hospital with only one extra "brown" bandage. She stated that the one that was put on her after surgery filled up with blood and her husband had to change the bandage. She stated that something needs to be done or provided to them to cover her incision because she is on xarelto. I advised them that we did not keep these specific bandages at our office. She asked for you to call her and let her know what to do ASAP because her husband is coming by.  [phone]

## 2018-04-11 NOTE — Telephone Encounter (Signed)
Rx sent to pharmacy   

## 2018-04-11 NOTE — Telephone Encounter (Signed)
Patient aware that if this bandage comes off she can just apply a dry dressing. She states her bandage yesterday was full of blood, I told her that if this new bandage was the same than she needs to be seen that she should not be bleeding like that anymore

## 2018-04-12 ENCOUNTER — Other Ambulatory Visit: Payer: Self-pay | Admitting: Rheumatology

## 2018-04-12 ENCOUNTER — Other Ambulatory Visit: Payer: Self-pay | Admitting: *Deleted

## 2018-04-12 ENCOUNTER — Telehealth (INDEPENDENT_AMBULATORY_CARE_PROVIDER_SITE_OTHER): Payer: Self-pay | Admitting: Orthopaedic Surgery

## 2018-04-12 DIAGNOSIS — Z96642 Presence of left artificial hip joint: Secondary | ICD-10-CM | POA: Diagnosis not present

## 2018-04-12 NOTE — Telephone Encounter (Signed)
[place], PT [name] is requesting home health physical therapy verbal to see patient 3 x for 3 weeks  CB # [phone]

## 2018-04-12 NOTE — Telephone Encounter (Signed)
[place] order given to [facility]

## 2018-04-12 NOTE — Patient Outreach (Signed)
[place] [facility]) Care Management  04/12/2018  Monica [name] 1956-01-05 [id number]   [place] call to patient's mobile number, no answer, left HIPAA compliant voicemail message, and requested call back.    /Objective:Per KPN (Knowledge Performance Now, point of care tool) and chart review,patienthospitalized8/23/19 - 04/09/18 forAvascular necrosis combined with osteoarthritis, left hip, Status post LEFT [place] 04/07/18. Patient also has a history of diabetes, hypertension,Atrial fibrillation,Osteoarthritis,andTachycardia.     Assessment: Received UMR Preoperative / Transition of care referral on 03/21/18.Preoperative call follow upnot completed due to patient unable to reach.Transition of care follow up pending patient contact.    Plan:RNCM has sent unsuccessful outreach letter, [facility] pamphlet, and will proceed with case closure, within 10 business days if no return call.     Elexis Pollak H. Annia Friendly, BSN, [place] Management [facility] Telephonic CM Phone: [phone] Fax: [phone]

## 2018-04-13 MED FILL — METHOTREXATE SODIUM 2.5 MG: 2.5 | 84 days supply | Qty: 96 | Fill #0

## 2018-04-13 NOTE — Telephone Encounter (Signed)
Last Visit: 11/24/17 Next Visit: due July 2019. Message sent to the front to schedule patient.  Labs: 04/09/18 Hgb 9.1 previously 10.7  Okay to refill per Dr. Estanislado Pandy

## 2018-04-18 ENCOUNTER — Other Ambulatory Visit: Payer: Self-pay | Admitting: Rheumatology

## 2018-04-18 MED FILL — FOLIC ACID 1 MG TABS: 1 | 90 days supply | Qty: 180 | Fill #0

## 2018-04-18 MED FILL — BYSTOLIC 10 MG TABLET: 10 | 30 days supply | Qty: 30 | Fill #3

## 2018-04-18 MED FILL — POTASSIUM CHL ER M10 TABLET: 10 | 90 days supply | Qty: 90 | Fill #1

## 2018-04-18 NOTE — Telephone Encounter (Signed)
Last Visit: 11/24/17 Next visit due. Message sent to the front to schedule patient   Okay to refill per Dr. Estanislado Pandy

## 2018-04-20 ENCOUNTER — Ambulatory Visit (INDEPENDENT_AMBULATORY_CARE_PROVIDER_SITE_OTHER): Payer: 59 | Admitting: Orthopaedic Surgery

## 2018-04-20 ENCOUNTER — Encounter (INDEPENDENT_AMBULATORY_CARE_PROVIDER_SITE_OTHER): Payer: Self-pay | Admitting: Orthopaedic Surgery

## 2018-04-20 DIAGNOSIS — Z96642 Presence of left artificial hip joint: Secondary | ICD-10-CM

## 2018-04-20 MED ORDER — OXYCODONE HCL 5 MG PO TABS: 5.0000 mg | ORAL_TABLET | ORAL | 0 refills | Status: DC | PRN

## 2018-04-20 MED ORDER — LUBIPROSTONE 24 MCG PO CAPS: 24.0000 ug | ORAL_CAPSULE | Freq: Two times a day (BID) | ORAL | 1 refills | Status: DC

## 2018-04-20 MED FILL — oxyCODONE HCL 5 MG TABS: 5 | 5 days supply | Qty: 40 | Fill #0

## 2018-04-20 NOTE — Progress Notes (Signed)
The patient will be 2 weeks tomorrow status post a left total hip arthroplasty to treat avascular necrosis.  She is ambulate with a rolling walker and doing well overall.  However there was some mixup with home therapy and she never got any therapy.  She is only had an evaluation.  On exam her left hip incision is good and staples were removed and Steri-Strips applied.  I stressed her the importance of keeping her groin crease dry.  Her leg lengths appear equal.  She also been putting her hip through range of motion.  This point we will start try to get her some therapy because she does need a rehab from this.  I did refill her oxycodone and put her on a stool softener as well.  All question concerns were answered and addressed we will see her back in 4 weeks to see how she is doing overall but no x-rays are needed.

## 2018-04-21 ENCOUNTER — Other Ambulatory Visit: Payer: Self-pay | Admitting: *Deleted

## 2018-04-21 ENCOUNTER — Telehealth (INDEPENDENT_AMBULATORY_CARE_PROVIDER_SITE_OTHER): Payer: Self-pay

## 2018-04-21 DIAGNOSIS — Z96642 Presence of left artificial hip joint: Secondary | ICD-10-CM | POA: Diagnosis not present

## 2018-04-21 NOTE — Telephone Encounter (Signed)
Patient insurance declined her having Amitiza

## 2018-04-21 NOTE — Patient Outreach (Signed)
[place] [facility]) Care Management  04/21/2018  Monica [name] 1955/11/04 [id number]   No response from patient outreach attempts will proceed with case closure.    Objective:Per KPN (Knowledge Performance Now, point of care tool) and chart review,patienthospitalized8/23/19 - 04/09/18 forAvascular necrosis combined with osteoarthritis, left hip, Status post LEFT TOTAL HIP ARTHROPLASTY ANTERIOR [place] 04/07/18. Patient also has a history of diabetes, hypertension,Atrial fibrillation,Osteoarthritis,andTachycardia.     Assessment: Received UMR Preoperative / Transition of care referral on 03/21/18.Preoperative call follow upnot completed due to patient unable to reach.Transition of care follow up not completed due to unable to contact patient and will proceed with case closure.     Plan:Case closure due to unable to reach.      Monica [name] H. Annia Friendly, BSN, [place] Management [facility] Telephonic CM Phone: [phone] Fax: [phone]

## 2018-04-24 ENCOUNTER — Telehealth: Payer: Self-pay | Admitting: Rheumatology

## 2018-04-24 DIAGNOSIS — I10 Essential (primary) hypertension: Secondary | ICD-10-CM | POA: Diagnosis not present

## 2018-04-24 DIAGNOSIS — Z96643 Presence of artificial hip joint, bilateral: Secondary | ICD-10-CM | POA: Diagnosis not present

## 2018-04-24 DIAGNOSIS — I4891 Unspecified atrial fibrillation: Secondary | ICD-10-CM | POA: Diagnosis not present

## 2018-04-24 DIAGNOSIS — G4733 Obstructive sleep apnea (adult) (pediatric): Secondary | ICD-10-CM | POA: Diagnosis not present

## 2018-04-24 DIAGNOSIS — L4050 Arthropathic psoriasis, unspecified: Secondary | ICD-10-CM | POA: Diagnosis not present

## 2018-04-24 DIAGNOSIS — Z6841 Body Mass Index (BMI) 40.0 and over, adult: Secondary | ICD-10-CM | POA: Diagnosis not present

## 2018-04-24 DIAGNOSIS — Z7901 Long term (current) use of anticoagulants: Secondary | ICD-10-CM | POA: Diagnosis not present

## 2018-04-24 DIAGNOSIS — Z471 Aftercare following joint replacement surgery: Secondary | ICD-10-CM | POA: Diagnosis not present

## 2018-04-24 NOTE — Telephone Encounter (Signed)
LMOM for patient to call and schedule her follow-up appointment with Dr. Estanislado Pandy

## 2018-04-24 NOTE — Telephone Encounter (Signed)
-----   Message from Carole Binning, LPN sent at 6/95/0722  8:23 AM EDT ----- Regarding: Please schedule patient for follow up visit.  Please schedule patient for follow up visit. Patient was due July 2019. Thanks!

## 2018-04-26 DIAGNOSIS — I10 Essential (primary) hypertension: Secondary | ICD-10-CM | POA: Diagnosis not present

## 2018-04-26 DIAGNOSIS — Z471 Aftercare following joint replacement surgery: Secondary | ICD-10-CM | POA: Diagnosis not present

## 2018-04-26 DIAGNOSIS — Z7901 Long term (current) use of anticoagulants: Secondary | ICD-10-CM | POA: Diagnosis not present

## 2018-04-26 DIAGNOSIS — I4891 Unspecified atrial fibrillation: Secondary | ICD-10-CM | POA: Diagnosis not present

## 2018-04-26 DIAGNOSIS — G4733 Obstructive sleep apnea (adult) (pediatric): Secondary | ICD-10-CM | POA: Diagnosis not present

## 2018-04-26 DIAGNOSIS — Z96643 Presence of artificial hip joint, bilateral: Secondary | ICD-10-CM | POA: Diagnosis not present

## 2018-04-26 DIAGNOSIS — Z6841 Body Mass Index (BMI) 40.0 and over, adult: Secondary | ICD-10-CM | POA: Diagnosis not present

## 2018-04-26 DIAGNOSIS — L4050 Arthropathic psoriasis, unspecified: Secondary | ICD-10-CM | POA: Diagnosis not present

## 2018-04-28 ENCOUNTER — Telehealth (INDEPENDENT_AMBULATORY_CARE_PROVIDER_SITE_OTHER): Payer: Self-pay | Admitting: Orthopaedic Surgery

## 2018-04-28 NOTE — Telephone Encounter (Signed)
Patient called [name] didn't show up for therapy today. Monica [name] would like to know what is her next option for therapy since it's only been 1 week.

## 2018-04-28 NOTE — Telephone Encounter (Signed)
Patient aware per Monica [name] they will continue coming out until her nexy appt

## 2018-05-01 ENCOUNTER — Telehealth (INDEPENDENT_AMBULATORY_CARE_PROVIDER_SITE_OTHER): Payer: Self-pay | Admitting: Orthopaedic Surgery

## 2018-05-01 NOTE — Telephone Encounter (Signed)
Patient called advised [name] still did not come out today. Patient asked if she can be referred to [facility] out patient rehab. Patient said she don't feel confident to return to work October 7th without the in home (PT) The number to contact patient is [phone]

## 2018-05-02 ENCOUNTER — Telehealth (INDEPENDENT_AMBULATORY_CARE_PROVIDER_SITE_OTHER): Payer: Self-pay | Admitting: Orthopaedic Surgery

## 2018-05-02 ENCOUNTER — Other Ambulatory Visit (INDEPENDENT_AMBULATORY_CARE_PROVIDER_SITE_OTHER): Payer: Self-pay

## 2018-05-02 DIAGNOSIS — Z96642 Presence of left artificial hip joint: Secondary | ICD-10-CM

## 2018-05-02 NOTE — Telephone Encounter (Signed)
What would you like me to send her for?  Strengthening, ROM?

## 2018-05-02 NOTE — Telephone Encounter (Signed)
Patient left a message yesterday in regards to her STD with [employer].  Please advise patient.  CB#[phone].  Thank you.

## 2018-05-02 NOTE — Telephone Encounter (Signed)
I am fine with the patient having outpatient physical therapy to work on balance and coordination status post a hip replacement.

## 2018-05-04 ENCOUNTER — Ambulatory Visit: Payer: 59 | Attending: Orthopaedic Surgery | Admitting: Physical Therapy

## 2018-05-04 ENCOUNTER — Other Ambulatory Visit: Payer: Self-pay

## 2018-05-04 ENCOUNTER — Encounter: Payer: Self-pay | Admitting: Physical Therapy

## 2018-05-04 DIAGNOSIS — M25552 Pain in left hip: Secondary | ICD-10-CM | POA: Diagnosis not present

## 2018-05-04 DIAGNOSIS — R2689 Other abnormalities of gait and mobility: Secondary | ICD-10-CM | POA: Diagnosis not present

## 2018-05-04 DIAGNOSIS — M6281 Muscle weakness (generalized): Secondary | ICD-10-CM | POA: Diagnosis not present

## 2018-05-04 DIAGNOSIS — M25652 Stiffness of left hip, not elsewhere classified: Secondary | ICD-10-CM

## 2018-05-04 NOTE — Therapy (Signed)
[place], [place], [address] Phone: [phone]   Fax:  [phone]  Physical Therapy Evaluation  Patient Details  Name: Monica [name] MRN: [id number] Date of Birth: 1955-10-27 Referring Provider: [name]   Encounter Date: 05/04/2018  PT End of Session - 05/04/18 1600    Visit Number  1    Number of Visits  17    Date for PT Re-Evaluation  06/29/18    Authorization Type  [facility] [employer]    PT Start Time  1548    PT Stop Time  1630    PT Time Calculation (min)  42 min    Activity Tolerance  Patient tolerated treatment well    Behavior During Therapy  [facility] for tasks assessed/performed       Past Medical History:  Diagnosis Date  . Atrial fibrillation ([place])    a. s/p DCCV in 12/2012 b. repeat DCCV in 09/2014 - started on Flecainide  . Avascular necrosis of bone of right hip ([place])   . Dysrhythmia    a fib  . Hypertension   . Morbid obesity ([place])   . Osteoarthritis   . Personal history of colonic polyps - large hyperplastic 12/25/2013  . Pre-diabetes   . Psoriasis    active breakout left buttocks  . Right hip pain   . Shortness of breath   . Sleep apnea    uses mouth guard only  . Tachycardia, unspecified     Past Surgical History:  Procedure Laterality Date  . CARDIOVERSION N/A 01/09/2013   Procedure: CARDIOVERSION;  Surgeon: [name], MD;  Location: [facility] ENDOSCOPY;  Service: Cardiovascular;  Laterality: N/A;  . CARDIOVERSION N/A 10/07/2014   Procedure: CARDIOVERSION;  Surgeon: [name], MD;  Location: [facility] ENDOSCOPY;  Service: Cardiovascular;  Laterality: N/A;  09:00 Lido 60mg ,IV followed by Propofol  70mg /IV    synched electrocardioversion at 120 joules for Afib,repeated at 200 joules, 70 mg...successfully changed to SR  . FRACTURE SURGERY  07/21/12   right hip arthroplasty  . JOINT REPLACEMENT     Left hip Dr. [name] 04-07-18  . TOTAL HIP ARTHROPLASTY  07/21/2012   Procedure:  TOTAL HIP ARTHROPLASTY;  Surgeon: [name], MD;  Location: [facility] ORS;  Service: Orthopedics;  Laterality: Right;  . TOTAL HIP ARTHROPLASTY Left 04/07/2018   Procedure: LEFT TOTAL HIP ARTHROPLASTY ANTERIOR APPROACH;  Surgeon: [name], MD;  Location: [facility] ORS;  Service: Orthopedics;  Laterality: Left;  Needs RNFA    There were no vitals filed for this visit.   Subjective Assessment - 05/04/18 1604    Subjective  pt is a 62 y.o F s/p L THA on 04/07/2018. Since surgery she reports continued pain and stiffness in the which she reports it is slowly getting bettter. She reports she had 4-5 HHPT visits whichs she reports she is nervous about having to go back to work due to her mobility status.     Pertinent History  hx or AFIB    Limitations  Standing;Walking    How long can you sit comfortably?  unlimited    How long can you stand comfortably?  20 min with AD    How long can you walk comfortably?  20 min with AD     Diagnostic tests  x-ray    Patient Stated Goals  to go up/ down stairs, walk without and AD, and work without complications     Currently in Pain?  Yes  Pain Score  0-No pain   at worst 7/10   Pain Location  Hip    Pain Orientation  Left;Lateral;Lower;Mid    Pain Descriptors / Indicators  Aching;Sore    Pain Type  Surgical pain    Pain Onset  More than a month ago    Pain Frequency  Intermittent    Aggravating Factors   prolonged standing/ walking, stairs, flexing hip     Pain Relieving Factors  medication,         [facility] PT Assessment - 05/04/18 1557      Assessment   Medical Diagnosis  L THA    Referring Provider  [name]    Onset Date/Surgical Date  04/07/18    Hand Dominance  Right    Next MD Visit  05/18/2018    Prior Therapy  yes      Precautions   Precautions  None      Restrictions   Weight Bearing Restrictions  No      Balance Screen   Has the patient fallen in the past 6 months  No    Has the patient had a decrease in  activity level because of a fear of falling?   No    Is the patient reluctant to leave their home because of a fear of falling?   No      Home Environment   Living Environment  Private residence    Living Arrangements  Spouse/significant other    Available Help at Discharge  Family;Available PRN/intermittently    Type of Home  House    Home Access  Level entry    Home Layout  Two level    Alternate Level Stairs-Number of Steps  12    Alternate Level Stairs-Rails  Left    Home Equipment  Cane - single point;Crutches;Bedside commode;Shower seat   rollator     Prior Function   Level of Independence  Independent with basic ADLs    Vocation  Full time employment   pt access for ED ,    Vocation Requirements  standing/ walking, sitting    Leisure  water aerobics, shopping, church      Cognition   Overall Cognitive Status  Within Functional Limits for tasks assessed      Observation/Other Assessments   Focus on Therapeutic Outcomes (FOTO)   73% limited   predicted 56% limited     Posture/Postural Control   Posture/Postural Control  Postural limitations    Postural Limitations  Rounded Shoulders;Forward head      ROM / Strength   AROM / PROM / Strength  AROM;PROM;Strength      AROM   AROM Assessment Site  Hip    Right/Left Hip  Right;Left    Right Hip Extension  4    Right Hip Flexion  80    Left Hip Flexion  50      PROM   PROM Assessment Site  Hip    Right/Left Hip  Left    Left Hip Extension  6    Left Hip Flexion  60      Strength   Strength Assessment Site  Hip;Knee    Right/Left Hip  Right;Left    Right Hip Flexion  4-/5    Right Hip Extension  3-/5    Right Hip ABduction  3/5    Left Hip Flexion  3/5   based on ROM   Left Hip Extension  3-/5    Left Hip ABduction  3/5   pain during testing   Right/Left Knee  Right;Left    Right Knee Flexion  5/5    Right Knee Extension  5/5    Left Knee Flexion  4/5   soreness during testing radiating to the hip   Left  Knee Extension  4/5      Palpation   Palpation comment  TTP along the distal quad and hamstring as well as proximal rectus femoris.       Ambulation/Gait   Ambulation/Gait  Yes    Assistive device  Rollator    Gait Pattern  Step-through pattern;Decreased stride length;Trendelenburg;Antalgic;Trunk flexed;Decreased trunk rotation                Objective measurements completed on examination: See above findings.      [place] Adult PT Treatment/Exercise - 05/04/18 1557      Exercises   Exercises  Knee/Hip      Knee/Hip Exercises: Stretches   Other Knee/Hip Stretches  standing hip flexor stretch 2 x 30    added glute set for added stretch     Knee/Hip Exercises: Supine   Heel Slides  1 set;10 reps   with strap            PT Education - 05/04/18 1736    Education Details  evaluation findings, POC, goals, HEp with proper form / rationale,     Person(s) Educated  Patient    Methods  Explanation;Verbal cues;Demonstration    Comprehension  Verbal cues required;Verbalized understanding;Returned demonstration       PT Short Term Goals - 05/04/18 1742      PT SHORT TERM GOAL #1   Title  pt to be I with inital HEP     Time  4    Period  Weeks    Status  New    Target Date  06/01/18      PT SHORT TERM GOAL #2   Title  pt to verbalize / demo proper posture and gait mechanics to prevent and reduce L hip pain    Time  4    Period  Weeks    Status  New    Target Date  06/01/18      PT SHORT TERM GOAL #3   Title  increase L hip flexion to >/= 60 degrees with </= 5/10 pain to promote functional progression     Time  4    Period  Weeks    Status  New    Target Date  06/01/18      PT SHORT TERM GOAL #4   Title  pt to be able to ambulate with [facility] for >/= 15 min reporting </= 4/10 pain for therapeutic progression     Time  4    Period  Weeks    Status  New    Target Date  06/01/18        PT Long Term Goals - 05/04/18 1744      PT LONG TERM GOAL #1    Title  pt to increase hip flexion to >/= 80 degrees on the L and extension to >/= 10 degreess to promote functional and efficient gait    Time  8    Period  Weeks    Status  New    Target Date  06/29/18      PT LONG TERM GOAL #2   Title  pt to demonstate >/= 4+/5 in LLE to promote hip stability with standing/ walking to maximize  safety    Time  8    Period  Weeks    Status  New    Target Date  06/29/18      PT LONG TERM GOAL #3   Title  pt to be able to stand/ walk >/= 1 hour with LRAD for endurance required for work related activities     Time  8    Period  Weeks    Status  New    Target Date  06/29/18      PT LONG TERM GOAL #4   Title  Increase FOTO score to </= 56% limited to demo improvement in function     Time  8    Period  Weeks    Status  New    Target Date  06/29/18      PT LONG TERM GOAL #5   Title  pt to be I with all HEP given as of last visit to maintain and progress current level of function independently    Time  8    Period  Weeks    Status  New    Target Date  06/29/18             Plan - 05/04/18 1736    Clinical Impression Statement  pt presents to OPPT s/p L THA on 04/07/2018 and had 4-5 HHPT visits. She demonstrates significant limitation with L hip AROM and PROM due to pain and weakness. TTP along the distal quad and hamstring as well as proximal rectus femoris. She currently uses rollator with trendelenberg gait and antalgic pattern. she would benefit from physical therapy to decrease hip pain, improve mobility and strength, maximize gait efficency with LRAD and maximize her overall function by addressing the deficits listed.     History and Personal Factors relevant to plan of care:  hx of AFib/ tachycardia    Clinical Presentation  Evolving    Clinical Presentation due to:  limited hip mobility, abnormal gait, decreased strength,     Clinical Decision Making  Moderate    Rehab Potential  Good    PT Frequency  3x / week   3 x week for 2 weeks,  then dropping down to 2 x week    PT Duration  8 weeks    PT Treatment/Interventions  ADLs/Self Care Home Management;Cryotherapy;Electrical Stimulation;Iontophoresis 4mg /ml Dexamethasone;Moist Heat;Balance training;Stair training;Functional mobility training;Therapeutic activities;Therapeutic exercise;Manual techniques;Taping;Passive range of motion;Patient/family education;Neuromuscular re-education    PT Next Visit Plan  review/ update HEP, STW, hip ROM/ stretching, strengthening, gait training, modalities PRN    PT Home Exercise Plan  standing/ supine hip flexor stretch, heel slide with strap, supine marching    Consulted and Agree with Plan of Care  Patient       Patient will benefit from skilled therapeutic intervention in order to improve the following deficits and impairments:  Abnormal gait, Obesity, Pain, Decreased activity tolerance, Decreased endurance, Decreased balance, Improper body mechanics, Postural dysfunction, Decreased strength, Increased fascial restricitons  Visit Diagnosis: Pain in left hip  Stiffness of left hip, not elsewhere classified  Other abnormalities of gait and mobility  Muscle weakness (generalized)     Problem List Patient Active Problem List   Diagnosis Date Noted  . Status post total replacement of left hip 04/07/2018  . Unilateral primary osteoarthritis, left hip 02/23/2018  . Psoriatic arthropathy ([place]) 11/17/2017  . History of total hip replacement, right 11/17/2017  . Chronic anticoagulation 11/19/2016  . Tinnitus 11/19/2016  . Right knee pain 07/06/2016  .  Left hip pain 10/21/2015  . Gastroesophageal reflux disease 09/19/2015  . Itchy eyes 03/12/2015  . Allergic conjunctivitis 03/12/2015  . OSA (obstructive sleep apnea) 02/20/2015  . Morbid obesity ([place]) 01/15/2015  . Lymphedema 06/19/2014  . Personal history of colonic polyps - large hyperplastic 12/25/2013  . Skin infection 10/14/2013  . Candidiasis of female genitalia 10/14/2013   . IFG (impaired fasting glucose) 10/14/2013  . Carpal tunnel syndrome 10/14/2013  . Cough 06/20/2013  . Allergic rhinitis, cause unspecified 06/20/2013  . Backache 06/05/2013  . Edema 12/20/2012  . Swelling of limb 11/30/2012  . Essential hypertension, benign 11/30/2012  . Headache(784.0) 11/30/2012  . Avascular necrosis of hip ([place]) 07/21/2012  . Atrial fibrillation ([place]) 07/06/2012  . Hypertension 09/14/2011  . Psoriasis 08/16/2005   Starr Lake PT, DPT, LAT, ATC  05/04/18  5:49 PM      [facility] [address] [place], [place], [address] Phone: [phone]   Fax:  [phone]  Name: LYNISE PORR MRN: [id number] Date of Birth: [birth year]

## 2018-05-05 ENCOUNTER — Ambulatory Visit: Payer: 59 | Admitting: Physical Therapy

## 2018-05-08 ENCOUNTER — Ambulatory Visit: Payer: 59 | Admitting: Physical Therapy

## 2018-05-08 ENCOUNTER — Encounter: Payer: 59 | Admitting: Physical Therapy

## 2018-05-08 ENCOUNTER — Encounter: Payer: Self-pay | Admitting: Physical Therapy

## 2018-05-08 DIAGNOSIS — R2689 Other abnormalities of gait and mobility: Secondary | ICD-10-CM | POA: Diagnosis not present

## 2018-05-08 DIAGNOSIS — M25652 Stiffness of left hip, not elsewhere classified: Secondary | ICD-10-CM | POA: Diagnosis not present

## 2018-05-08 DIAGNOSIS — M6281 Muscle weakness (generalized): Secondary | ICD-10-CM | POA: Diagnosis not present

## 2018-05-08 DIAGNOSIS — M25552 Pain in left hip: Secondary | ICD-10-CM | POA: Diagnosis not present

## 2018-05-09 ENCOUNTER — Encounter: Payer: Self-pay | Admitting: Physical Therapy

## 2018-05-09 ENCOUNTER — Ambulatory Visit: Payer: 59 | Admitting: Physical Therapy

## 2018-05-09 DIAGNOSIS — M6281 Muscle weakness (generalized): Secondary | ICD-10-CM | POA: Diagnosis not present

## 2018-05-09 DIAGNOSIS — M25652 Stiffness of left hip, not elsewhere classified: Secondary | ICD-10-CM

## 2018-05-09 DIAGNOSIS — R2689 Other abnormalities of gait and mobility: Secondary | ICD-10-CM | POA: Diagnosis not present

## 2018-05-09 DIAGNOSIS — M25552 Pain in left hip: Secondary | ICD-10-CM | POA: Diagnosis not present

## 2018-05-09 NOTE — Therapy (Addendum)
[place], [place], [address] Phone: [phone]   Fax:  [phone]  Physical Therapy Treatment  Patient Details  Name: Monica [name] MRN: [id number] Date of Birth: 1956-08-16 Referring Provider: [name]   Encounter Date: 05/08/2018  PT End of Session - 05/08/18 1708    Visit Number  2    Number of Visits  17    Date for PT Re-Evaluation  06/29/18    Authorization Type  [facility]    PT Start Time  1520   Patient 20 minutes late for appointment    PT Stop Time  1545    PT Time Calculation (min)  25 min    Activity Tolerance  Patient tolerated treatment well    Behavior During Therapy  [facility] for tasks assessed/performed       Past Medical History:  Diagnosis Date  . Atrial fibrillation ([place])    a. s/p DCCV in 12/2012 b. repeat DCCV in 09/2014 - started on Flecainide  . Avascular necrosis of bone of right hip ([place])   . Dysrhythmia    a fib  . Hypertension   . Morbid obesity ([place])   . Osteoarthritis   . Personal history of colonic polyps - large hyperplastic 12/25/2013  . Pre-diabetes   . Psoriasis    active breakout left buttocks  . Right hip pain   . Shortness of breath   . Sleep apnea    uses mouth guard only  . Tachycardia, unspecified     Past Surgical History:  Procedure Laterality Date  . CARDIOVERSION N/A 01/09/2013   Procedure: CARDIOVERSION;  Surgeon: [name], MD;  Location: [facility] ENDOSCOPY;  Service: Cardiovascular;  Laterality: N/A;  . CARDIOVERSION N/A 10/07/2014   Procedure: CARDIOVERSION;  Surgeon: [name], MD;  Location: [facility] ENDOSCOPY;  Service: Cardiovascular;  Laterality: N/A;  09:00 Lido 60mg ,IV followed by Propofol  70mg /IV    synched electrocardioversion at 120 joules for Afib,repeated at 200 joules, 70 mg...successfully changed to SR  . FRACTURE SURGERY  07/21/12   right hip arthroplasty  . JOINT REPLACEMENT     Left hip Dr. [name] 04-07-18  . TOTAL  HIP ARTHROPLASTY  07/21/2012   Procedure: TOTAL HIP ARTHROPLASTY;  Surgeon: [name], MD;  Location: [facility] ORS;  Service: Orthopedics;  Laterality: Right;  . TOTAL HIP ARTHROPLASTY Left 04/07/2018   Procedure: LEFT TOTAL HIP ARTHROPLASTY ANTERIOR APPROACH;  Surgeon: [name], MD;  Location: [facility]S;  Service: Orthopedics;  Laterality: Left;  Needs RNFA    There were no vitals filed for this visit.  Subjective Assessment - 05/08/18 1533    Subjective  Patient reports her hip is in its usaul state of stiffness and soreness. She has been working on her exercises and has been walking in the house with her cane.     Pertinent History  hx or AFIB    Limitations  Standing;Walking    How long can you sit comfortably?  unlimited    How long can you stand comfortably?  20 min with AD    How long can you walk comfortably?  20 min with AD     Diagnostic tests  x-ray    Patient Stated Goals  to go up/ down stairs, walk without and AD, and work without complications     Currently in Pain?  Yes    Pain Score  3     Pain Location  Hip  Pain Orientation  Right;Left    Pain Descriptors / Indicators  Aching    Pain Type  Surgical pain    Pain Onset  More than a month ago    Pain Frequency  Intermittent    Aggravating Factors   prolonged standing and walking     Pain Relieving Factors  medication                        [facility] Adult PT Treatment/Exercise - 05/09/18 0001      Ambulation/Gait   Gait Comments  reviewed proper height of her cane; reviewed proper gait pattern.       Exercises   Exercises  Knee/Hip      Knee/Hip Exercises: Supine   Quad Sets  2 sets;10 reps    Short Arc Quad Sets  2 sets;10 reps    Short Arc Quad Sets Limitations  1lb     Heel Slides  1 set;10 reps   with strap     Knee/Hip Exercises: Sidelying   Other Sidelying Knee/Hip Exercises  hip abduction yellow     Other Sidelying Knee/Hip Exercises  seated LAQ x10      Manual Therapy    Manual Therapy  Passive ROM    Passive ROM  Gentle PROM into flexion and extension              PT Education - 05/08/18 1708    Education Details  reviewed exercises and stretches     Person(s) Educated  Patient    Methods  Explanation;Demonstration;Tactile cues;Verbal cues    Comprehension  Verbalized understanding;Returned demonstration;Tactile cues required;Verbal cues required       PT Short Term Goals - 05/04/18 1742      PT SHORT TERM GOAL #1   Title  pt to be I with inital HEP     Time  4    Period  Weeks    Status  New    Target Date  06/01/18      PT SHORT TERM GOAL #2   Title  pt to verbalize / demo proper posture and gait mechanics to prevent and reduce L hip pain    Time  4    Period  Weeks    Status  New    Target Date  06/01/18      PT SHORT TERM GOAL #3   Title  increase L hip flexion to >/= 60 degrees with </= 5/10 pain to promote functional progression     Time  4    Period  Weeks    Status  New    Target Date  06/01/18      PT SHORT TERM GOAL #4   Title  pt to be able to ambulate with [facility] for >/= 15 min reporting </= 4/10 pain for therapeutic progression     Time  4    Period  Weeks    Status  New    Target Date  06/01/18        PT Long Term Goals - 05/04/18 1744      PT LONG TERM GOAL #1   Title  pt to increase hip flexion to >/= 80 degrees on the L and extension to >/= 10 degreess to promote functional and efficient gait    Time  8    Period  Weeks    Status  New    Target Date  06/29/18      PT LONG  TERM GOAL #2   Title  pt to demonstate >/= 4+/5 in LLE to promote hip stability with standing/ walking to maximize safety    Time  8    Period  Weeks    Status  New    Target Date  06/29/18      PT LONG TERM GOAL #3   Title  pt to be able to stand/ walk >/= 1 hour with LRAD for endurance required for work related activities     Time  8    Period  Weeks    Status  New    Target Date  06/29/18      PT LONG TERM GOAL #4   Title   Increase FOTO score to </= 56% limited to demo improvement in function     Time  8    Period  Weeks    Status  New    Target Date  06/29/18      PT LONG TERM GOAL #5   Title  pt to be I with all HEP given as of last visit to maintain and progress current level of function independently    Time  8    Period  Weeks    Status  New    Target Date  06/29/18            Plan - 05/09/18 1041    Clinical Impression Statement  Limited session 2nd to being 20 minutes late. Therapy reviewed propper fitting of her cane. Patient demonstrated good heel/toe pattern with gait. She feels at home like she fatigues quickly with the cane. Therapy advised her that this is normal and she needs to continue with her exercises. Therapy added seated clamshell in low range. She had no increase in pain with ther-ex.     Clinical Presentation  Evolving    Clinical Decision Making  Moderate    Rehab Potential  Good    PT Frequency  3x / week    PT Duration  8 weeks    PT Treatment/Interventions  ADLs/Self Care Home Management;Cryotherapy;Electrical Stimulation;Iontophoresis 4mg /ml Dexamethasone;Moist Heat;Balance training;Stair training;Functional mobility training;Therapeutic activities;Therapeutic exercise;Manual techniques;Taping;Passive range of motion;Patient/family education;Neuromuscular re-education    PT Next Visit Plan  review/ update HEP, STW, hip ROM/ stretching, strengthening, gait training, modalities PRN    PT Home Exercise Plan  standing/ supine hip flexor stretch, heel slide with strap, supine marching    Consulted and Agree with Plan of Care  Patient       Patient will benefit from skilled therapeutic intervention in order to improve the following deficits and impairments:  Abnormal gait, Obesity, Pain, Decreased activity tolerance, Decreased endurance, Decreased balance, Improper body mechanics, Postural dysfunction, Decreased strength, Increased fascial restricitons  Visit Diagnosis: Pain  in left hip  Stiffness of left hip, not elsewhere classified  Other abnormalities of gait and mobility  Muscle weakness (generalized)     Problem List Patient Active Problem List   Diagnosis Date Noted  . Status post total replacement of left hip 04/07/2018  . Unilateral primary osteoarthritis, left hip 02/23/2018  . 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 History of total hip replacement, right 11/17/2017  . Chronic anticoagulation 11/19/2016  . Tinnitus 11/19/2016  . Right knee pain 07/06/2016  . Left hip pain 10/21/2015  . Gastroesophageal reflux disease 09/19/2015  . Itchy eyes 03/12/2015  . Allergic conjunctivitis 03/12/2015  . OSA (obstructive sleep apnea) 02/20/2015  . Morbid obesity ([place]) 01/15/2015  . Lymphedema 06/19/2014  . Personal history  of colonic polyps - large hyperplastic 12/25/2013  . Skin infection 10/14/2013  . Candidiasis of female genitalia 10/14/2013  . IFG (impaired fasting glucose) 10/14/2013  . Carpal tunnel syndrome 10/14/2013  . Cough 06/20/2013  . Allergic rhinitis, cause unspecified 06/20/2013  . Backache 06/05/2013  . Edema 12/20/2012  . Swelling of limb 11/30/2012  . Essential hypertension, benign 11/30/2012  . Headache(784.0) 11/30/2012  . Avascular necrosis of hip ([place]) 07/21/2012  . Atrial fibrillation ([place]) 07/06/2012  . Hypertension 09/14/2011  . Psoriasis 08/16/2005    Carney Living PT DPT  05/09/2018, 3:50 PM  [facility] [address] [place], [place], [address] Phone: [phone]   Fax:  [phone]  Name: JAYCEY GENS MRN: [id number] Date of Birth: Apr 13, 1956

## 2018-05-09 NOTE — Therapy (Signed)
[place], [place], [address] Phone: [phone]   Fax:  [phone]  Physical Therapy Treatment  Patient Details  Name: Monica [name] MRN: [id number] Date of Birth: Jun 01, 1956 Referring Provider: [name]   Encounter Date: 05/09/2018  PT End of Session - 05/09/18 1549    Visit Number  3    Number of Visits  17    Date for PT Re-Evaluation  06/29/18    Authorization Type  [facility] [employer]    PT Start Time  6644    PT Stop Time  1629    PT Time Calculation (min)  40 min    Activity Tolerance  Patient tolerated treatment well    Behavior During Therapy  [facility] for tasks assessed/performed       Past Medical History:  Diagnosis Date  . Atrial fibrillation ([place])    a. s/p DCCV in 12/2012 b. repeat DCCV in 09/2014 - started on Flecainide  . Avascular necrosis of bone of right hip ([place])   . Dysrhythmia    a fib  . Hypertension   . Morbid obesity ([place])   . Osteoarthritis   . Personal history of colonic polyps - large hyperplastic 12/25/2013  . Pre-diabetes   . Psoriasis    active breakout left buttocks  . Right hip pain   . Shortness of breath   . Sleep apnea    uses mouth guard only  . Tachycardia, unspecified     Past Surgical History:  Procedure Laterality Date  . CARDIOVERSION N/A 01/09/2013   Procedure: CARDIOVERSION;  Surgeon: [name], MD;  Location: [facility] ENDOSCOPY;  Service: Cardiovascular;  Laterality: N/A;  . CARDIOVERSION N/A 10/07/2014   Procedure: CARDIOVERSION;  Surgeon: [name], MD;  Location: [facility] ENDOSCOPY;  Service: Cardiovascular;  Laterality: N/A;  09:00 Lido 60mg ,IV followed by Propofol  70mg /IV    synched electrocardioversion at 120 joules for Afib,repeated at 200 joules, 70 mg...successfully changed to SR  . FRACTURE SURGERY  07/21/12   right hip arthroplasty  . JOINT REPLACEMENT     Left hip Dr. [name] 04-07-18  . TOTAL HIP ARTHROPLASTY  07/21/2012   Procedure:  TOTAL HIP ARTHROPLASTY;  Surgeon: [name], MD;  Location: [facility] ORS;  Service: Orthopedics;  Laterality: Right;  . TOTAL HIP ARTHROPLASTY Left 04/07/2018   Procedure: LEFT TOTAL HIP ARTHROPLASTY ANTERIOR APPROACH;  Surgeon: [name], MD;  Location: [facility] ORS;  Service: Orthopedics;  Laterality: Left;  Needs RNFA    There were no vitals filed for this visit.  Subjective Assessment - 05/09/18 1549    Subjective  hip is feeling okay, has not taken anything today. unable to lift leg to don pants    Patient Stated Goals  to go up/ down stairs, walk without and AD, and work without complications     Currently in Pain?  No/denies                       [facility] Adult PT Treatment/Exercise - 05/09/18 1554      Exercises   Exercises  Knee/Hip      Knee/Hip Exercises: Stretches   Passive Hamstring Stretch  Both;2 reps;30 seconds    Knee: Self-Stretch Limitations  heel slides with green strap    Other Knee/Hip Stretches  butterfly      Knee/Hip Exercises: Standing   Heel Raises  Both;10 reps;3 sets    Gait Training  fit of cane &  stepping with cane    Other Standing Knee Exercises  glut sets 10x 5s holds      Knee/Hip Exercises: Seated   Long Arc Quad  Both;2 sets;10 reps    Long Arc Quad Limitations  ball bw knees      Knee/Hip Exercises: Supine   Quad Sets  10 reps   bil squeeze with slow release              PT Short Term Goals - 05/04/18 1742      PT SHORT TERM GOAL #1   Title  pt to be I with inital HEP     Time  4    Period  Weeks    Status  New    Target Date  06/01/18      PT SHORT TERM GOAL #2   Title  pt to verbalize / demo proper posture and gait mechanics to prevent and reduce L hip pain    Time  4    Period  Weeks    Status  New    Target Date  06/01/18      PT SHORT TERM GOAL #3   Title  increase L hip flexion to >/= 60 degrees with </= 5/10 pain to promote functional progression     Time  4    Period  Weeks    Status   New    Target Date  06/01/18      PT SHORT TERM GOAL #4   Title  pt to be able to ambulate with [facility] for >/= 15 min reporting </= 4/10 pain for therapeutic progression     Time  4    Period  Weeks    Status  New    Target Date  06/01/18        PT Long Term Goals - 05/04/18 1744      PT LONG TERM GOAL #1   Title  pt to increase hip flexion to >/= 80 degrees on the L and extension to >/= 10 degreess to promote functional and efficient gait    Time  8    Period  Weeks    Status  New    Target Date  06/29/18      PT LONG TERM GOAL #2   Title  pt to demonstate >/= 4+/5 in LLE to promote hip stability with standing/ walking to maximize safety    Time  8    Period  Weeks    Status  New    Target Date  06/29/18      PT LONG TERM GOAL #3   Title  pt to be able to stand/ walk >/= 1 hour with LRAD for endurance required for work related activities     Time  8    Period  Weeks    Status  New    Target Date  06/29/18      PT LONG TERM GOAL #4   Title  Increase FOTO score to </= 56% limited to demo improvement in function     Time  8    Period  Weeks    Status  New    Target Date  06/29/18      PT LONG TERM GOAL #5   Title  pt to be I with all HEP given as of last visit to maintain and progress current level of function independently    Time  8    Period  Weeks    Status  New    Target Date  06/29/18            Plan - 05/09/18 1629    Clinical Impression Statement  tends to perform circumduction for progression of LLE and was addressed today. cane she has for home is a little too tall and proper fit was discussed so her husband can cut it. Pt would like to send hospital bed back so she would like to work on stairs at her next visit to determine if she is comfortable enough to return bed and go upstairs to her bed.     PT Treatment/Interventions  ADLs/Self Care Home Management;Cryotherapy;Electrical Stimulation;Iontophoresis 4mg /ml Dexamethasone;Moist Heat;Balance  training;Stair training;Functional mobility training;Therapeutic activities;Therapeutic exercise;Manual techniques;Taping;Passive range of motion;Patient/family education;Neuromuscular re-education    PT Next Visit Plan  stairs    PT Home Exercise Plan  standing/ supine hip flexor stretch, heel slide with strap, supine marching, heel raises, standing glut sets    Consulted and Agree with Plan of Care  Patient       Patient will benefit from skilled therapeutic intervention in order to improve the following deficits and impairments:  Abnormal gait, Obesity, Pain, Decreased activity tolerance, Decreased endurance, Decreased balance, Improper body mechanics, Postural dysfunction, Decreased strength, Increased fascial restricitons  Visit Diagnosis: Pain in left hip  Stiffness of left hip, not elsewhere classified  Other abnormalities of gait and mobility  Muscle weakness (generalized)     Problem List Patient Active Problem List   Diagnosis Date Noted  . Status post total replacement of left hip 04/07/2018  . Unilateral primary osteoarthritis, left hip 02/23/2018  . Psoriatic arthropathy ([place]) 11/17/2017  . History of total hip replacement, right 11/17/2017  . Chronic anticoagulation 11/19/2016  . Tinnitus 11/19/2016  . Right knee pain 07/06/2016  . Left hip pain 10/21/2015  . Gastroesophageal reflux disease 09/19/2015  . Itchy eyes 03/12/2015  . Allergic conjunctivitis 03/12/2015  . OSA (obstructive sleep apnea) 02/20/2015  . Morbid obesity ([place]) 01/15/2015  . Lymphedema 06/19/2014  . Personal history of colonic polyps - large hyperplastic 12/25/2013  . Skin infection 10/14/2013  . Candidiasis of female genitalia 10/14/2013  . IFG (impaired fasting glucose) 10/14/2013  . Carpal tunnel syndrome 10/14/2013  . Cough 06/20/2013  . Allergic rhinitis, cause unspecified 06/20/2013  . Backache 06/05/2013  . Edema 12/20/2012  . Swelling of limb 11/30/2012  . Essential  hypertension, benign 11/30/2012  . Headache(784.0) 11/30/2012  . Avascular necrosis of hip ([place]) 07/21/2012  . Atrial fibrillation ([place]) 07/06/2012  . Hypertension 09/14/2011  . Psoriasis 08/16/2005    Eldar Robitaille C. Ezel Vallone PT, DPT 05/09/18 5:13 PM   [place], [place], [address] Phone: [phone]   Fax:  [phone]  Name: Monica [name] MRN: [id number] Date of Birth: 03-Dec-1955

## 2018-05-11 ENCOUNTER — Ambulatory Visit: Payer: 59 | Admitting: Physical Therapy

## 2018-05-11 ENCOUNTER — Encounter: Payer: Self-pay | Admitting: Physical Therapy

## 2018-05-11 DIAGNOSIS — R2689 Other abnormalities of gait and mobility: Secondary | ICD-10-CM | POA: Diagnosis not present

## 2018-05-11 DIAGNOSIS — M25552 Pain in left hip: Secondary | ICD-10-CM | POA: Diagnosis not present

## 2018-05-11 DIAGNOSIS — M6281 Muscle weakness (generalized): Secondary | ICD-10-CM | POA: Diagnosis not present

## 2018-05-11 DIAGNOSIS — M25652 Stiffness of left hip, not elsewhere classified: Secondary | ICD-10-CM | POA: Diagnosis not present

## 2018-05-11 NOTE — Therapy (Signed)
[place], [place], [address] Phone: [phone]   Fax:  [phone]  Physical Therapy Treatment  Patient Details  Name: Monica [name] MRN: [id number] Date of Birth: [birth year] Referring Provider (PT): [name]   Encounter Date: 05/11/2018  PT End of Session - 05/11/18 1548    Visit Number  4    Number of Visits  17    Date for PT Re-Evaluation  06/29/18    Authorization Type  [facility]    PT Start Time  7408    PT Stop Time  1635    PT Time Calculation (min)  48 min    Activity Tolerance  Patient tolerated treatment well    Behavior During Therapy  [facility] for tasks assessed/performed       Past Medical History:  Diagnosis Date  . Atrial fibrillation ([place])    a. s/p DCCV in 12/2012 b. repeat DCCV in 09/2014 - started on Flecainide  . Avascular necrosis of bone of right hip ([place])   . Dysrhythmia    a fib  . Hypertension   . Morbid obesity ([place])   . Osteoarthritis   . Personal history of colonic polyps - large hyperplastic 12/25/2013  . Pre-diabetes   . Psoriasis    active breakout left buttocks  . Right hip pain   . Shortness of breath   . Sleep apnea    uses mouth guard only  . Tachycardia, unspecified     Past Surgical History:  Procedure Laterality Date  . CARDIOVERSION N/A 01/09/2013   Procedure: CARDIOVERSION;  Surgeon: [name], MD;  Location: [facility] ENDOSCOPY;  Service: Cardiovascular;  Laterality: N/A;  . CARDIOVERSION N/A 10/07/2014   Procedure: CARDIOVERSION;  Surgeon: [name], MD;  Location: [facility] ENDOSCOPY;  Service: Cardiovascular;  Laterality: N/A;  09:00 Lido 60mg ,IV followed by Propofol  70mg /IV    synched electrocardioversion at 120 joules for Afib,repeated at 200 joules, 70 mg...successfully changed to SR  . FRACTURE SURGERY  07/21/12   right hip arthroplasty  . JOINT REPLACEMENT     Left hip Dr. [name] 04-07-18  . TOTAL HIP ARTHROPLASTY  07/21/2012   Procedure: TOTAL HIP ARTHROPLASTY;  Surgeon: [name], MD;  Location: [facility] ORS;  Service: Orthopedics;  Laterality: Right;  . TOTAL HIP ARTHROPLASTY Left 04/07/2018   Procedure: LEFT TOTAL HIP ARTHROPLASTY ANTERIOR APPROACH;  Surgeon: [name], MD;  Location: [facility] ORS;  Service: Orthopedics;  Laterality: Left;  Needs RNFA    There were no vitals filed for this visit.  Subjective Assessment - 05/11/18 1548    Subjective  I was so sore after my last visit. My muscles are sore, not hurting    Patient Stated Goals  to go up/ down stairs, walk without and AD, and work without complications                        [facility] Adult PT Treatment/Exercise - 05/11/18 0001      Therapeutic Activites    Therapeutic Activities  ADL's    ADL's  stairs      Exercises   Exercises  Knee/Hip      Knee/Hip Exercises: Stretches   Passive Hamstring Stretch  Both;30 seconds    Other Knee/Hip Stretches  standing hip flexor stretch      Knee/Hip Exercises: Aerobic   Nustep  6 min L3 LE only      Modalities  Modalities  Cryotherapy      Cryotherapy   Number Minutes Cryotherapy  10 Minutes    Cryotherapy Location  Hip   Lt   Type of Cryotherapy  Ice pack      Manual Therapy   Manual Therapy  Soft tissue mobilization    Manual therapy comments  edema mob at proximal thigh    Soft tissue mobilization  roller Lt thigh               PT Short Term Goals - 05/04/18 1742      PT SHORT TERM GOAL #1   Title  pt to be I with inital HEP     Time  4    Period  Weeks    Status  New    Target Date  06/01/18      PT SHORT TERM GOAL #2   Title  pt to verbalize / demo proper posture and gait mechanics to prevent and reduce L hip pain    Time  4    Period  Weeks    Status  New    Target Date  06/01/18      PT SHORT TERM GOAL #3   Title  increase L hip flexion to >/= 60 degrees with </= 5/10 pain to promote functional progression     Time  4    Period  Weeks     Status  New    Target Date  06/01/18      PT SHORT TERM GOAL #4   Title  pt to be able to ambulate with [facility] for >/= 15 min reporting </= 4/10 pain for therapeutic progression     Time  4    Period  Weeks    Status  New    Target Date  06/01/18        PT Long Term Goals - 05/04/18 1744      PT LONG TERM GOAL #1   Title  pt to increase hip flexion to >/= 80 degrees on the L and extension to >/= 10 degreess to promote functional and efficient gait    Time  8    Period  Weeks    Status  New    Target Date  06/29/18      PT LONG TERM GOAL #2   Title  pt to demonstate >/= 4+/5 in LLE to promote hip stability with standing/ walking to maximize safety    Time  8    Period  Weeks    Status  New    Target Date  06/29/18      PT LONG TERM GOAL #3   Title  pt to be able to stand/ walk >/= 1 hour with LRAD for endurance required for work related activities     Time  8    Period  Weeks    Status  New    Target Date  06/29/18      PT LONG TERM GOAL #4   Title  Increase FOTO score to </= 56% limited to demo improvement in function     Time  8    Period  Weeks    Status  New    Target Date  06/29/18      PT LONG TERM GOAL #5   Title  pt to be I with all HEP given as of last visit to maintain and progress current level of function independently    Time  8  Period  Weeks    Status  New    Target Date  06/29/18            Plan - 05/11/18 1626    Clinical Impression Statement  good tolerance to stairs today. multiple cues provided with SBA. Asked pt to perform slowly and carefully at home. edema noted around incision site decreased with mobilization. Improved gait pattern with less circumduction in swing through.     PT Treatment/Interventions  ADLs/Self Care Home Management;Cryotherapy;Electrical Stimulation;Iontophoresis 4mg /ml Dexamethasone;Moist Heat;Balance training;Stair training;Functional mobility training;Therapeutic activities;Therapeutic exercise;Manual  techniques;Taping;Passive range of motion;Patient/family education;Neuromuscular re-education    PT Next Visit Plan  review stairs PRN, gross LE strengthening    PT Home Exercise Plan  standing/ supine hip flexor stretch, heel slide with strap, supine marching, heel raises, standing glut sets    Consulted and Agree with Plan of Care  Patient       Patient will benefit from skilled therapeutic intervention in order to improve the following deficits and impairments:  Abnormal gait, Obesity, Pain, Decreased activity tolerance, Decreased endurance, Decreased balance, Improper body mechanics, Postural dysfunction, Decreased strength, Increased fascial restricitons  Visit Diagnosis: Pain in left hip  Stiffness of left hip, not elsewhere classified  Other abnormalities of gait and mobility  Muscle weakness (generalized)     Problem List Patient Active Problem List   Diagnosis Date Noted  . Status post total replacement of left hip 04/07/2018  . Unilateral primary osteoarthritis, left hip 02/23/2018  . 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 History of total hip replacement, right 11/17/2017  . Chronic anticoagulation 11/19/2016  . Tinnitus 11/19/2016  . Right knee pain 07/06/2016  . Left hip pain 10/21/2015  . Gastroesophageal reflux disease 09/19/2015  . Itchy eyes 03/12/2015  . Allergic conjunctivitis 03/12/2015  . OSA (obstructive sleep apnea) 02/20/2015  . Morbid obesity ([place]) 01/15/2015  . Lymphedema 06/19/2014  . Personal history of colonic polyps - large hyperplastic 12/25/2013  . Skin infection 10/14/2013  . Candidiasis of female genitalia 10/14/2013  . IFG (impaired fasting glucose) 10/14/2013  . Carpal tunnel syndrome 10/14/2013  . Cough 06/20/2013  . Allergic rhinitis, cause unspecified 06/20/2013  . Backache 06/05/2013  . Edema 12/20/2012  . Swelling of limb 11/30/2012  . Essential hypertension, benign 11/30/2012  . Headache(784.0) 11/30/2012  . Avascular  necrosis of hip ([place]) 07/21/2012  . Atrial fibrillation ([place]) 07/06/2012  . Hypertension 09/14/2011  . Psoriasis 08/16/2005    Triana Coover C. Orlyn Odonoghue PT, DPT 05/11/18 4:29 PM   [place] [facility] [address] [place], [place], [address] Phone: [phone]   Fax:  [phone]  Name: SHAWNELLE SPOERL MRN: [id number] Date of Birth: 10/03/1955

## 2018-05-12 ENCOUNTER — Encounter

## 2018-05-15 ENCOUNTER — Ambulatory Visit: Payer: 59 | Admitting: Physical Therapy

## 2018-05-15 DIAGNOSIS — M25552 Pain in left hip: Secondary | ICD-10-CM | POA: Diagnosis not present

## 2018-05-15 DIAGNOSIS — M6281 Muscle weakness (generalized): Secondary | ICD-10-CM

## 2018-05-15 DIAGNOSIS — M25652 Stiffness of left hip, not elsewhere classified: Secondary | ICD-10-CM | POA: Diagnosis not present

## 2018-05-15 DIAGNOSIS — R2689 Other abnormalities of gait and mobility: Secondary | ICD-10-CM | POA: Diagnosis not present

## 2018-05-15 MED FILL — TRULICITY 1.5 MG/0.5 ML PEN: 1.5 | 28 days supply | Qty: 2 | Fill #3

## 2018-05-15 NOTE — Therapy (Signed)
[place], [place], [address] Phone: [phone]   Fax:  [phone]  Physical Therapy Treatment  Patient Details  Name: Monica [name] MRN: [id number] Date of Birth: Jan 10, 1956 Referring Provider (PT): Dr Zollie Beckers   Encounter Date: 05/15/2018  PT End of Session - 05/15/18 1336    Visit Number  5    Number of Visits  17    Date for PT Re-Evaluation  06/29/18    Authorization Type  [facility]    PT Start Time  1336    PT Stop Time  1427    PT Time Calculation (min)  51 min    Activity Tolerance  Patient tolerated treatment well       Past Medical History:  Diagnosis Date  . Atrial fibrillation ([place])    a. s/p DCCV in 12/2012 b. repeat DCCV in 09/2014 - started on Flecainide  . Avascular necrosis of bone of right hip ([place])   . Dysrhythmia    a fib  . Hypertension   . Morbid obesity ([place])   . Osteoarthritis   . Personal history of colonic polyps - large hyperplastic 12/25/2013  . Pre-diabetes   . Psoriasis    active breakout left buttocks  . Right hip pain   . Shortness of breath   . Sleep apnea    uses mouth guard only  . Tachycardia, unspecified     Past Surgical History:  Procedure Laterality Date  . CARDIOVERSION N/A 01/09/2013   Procedure: CARDIOVERSION;  Surgeon: [name], MD;  Location: [facility] ENDOSCOPY;  Service: Cardiovascular;  Laterality: N/A;  . CARDIOVERSION N/A 10/07/2014   Procedure: CARDIOVERSION;  Surgeon: [name], MD;  Location: [facility] ENDOSCOPY;  Service: Cardiovascular;  Laterality: N/A;  09:00 Lido 66m,IV followed by Propofol  780mIV    synched electrocardioversion at 120 joules for Afib,repeated at 200 joules, 70 mg...successfully changed to SR  . FRACTURE SURGERY  07/21/12   right hip arthroplasty  . JOINT REPLACEMENT     Left hip Dr. BlNinfa Linden-23-19  . TOTAL HIP ARTHROPLASTY  07/21/2012   Procedure: TOTAL HIP ARTHROPLASTY;  Surgeon: FrGearlean AlfMD;   Location: [facility] ORS;  Service: Orthopedics;  Laterality: Right;  . TOTAL HIP ARTHROPLASTY Left 04/07/2018   Procedure: LEFT TOTAL HIP ARTHROPLASTY ANTERIOR APPROACH;  Surgeon: BlMcarthur RossettiMD;  Location: [facility] ORS;  Service: Orthopedics;  Laterality: Left;  Needs RNFA    There were no vitals filed for this visit.  Subjective Assessment - 05/15/18 1336    Subjective  Pt reports she is using her cane all the time at home now, still hasnt' donethe stairs because she is anxious about them.     Patient Stated Goals  to go up/ down stairs, walk without and AD, and work without complications     Currently in Pain?  No/denies   on having stiffness        [facility] PT Assessment - 05/15/18 0001      Assessment   Medical Diagnosis  L THA    Referring Provider (PT)  Dr ChZollie Beckers    AROM   AROM Assessment Site  Hip    Right/Left Hip  Left    Left Hip Flexion  67   with heel slide                  [facility] - 05/15/18 0001      Exercises  Exercises  Knee/Hip      Knee/Hip Exercises: Aerobic   Nustep  6 min L4 LE only      Knee/Hip Exercises: Standing   Lateral Step Up  Left;10 reps;Step Height: 6"   one hand assist, 8" step to high   Forward Step Up  Left;Step Height: 8";10 reps   one hand assist, VC for form   SLS  Lt  with Rt toe taps in diferent directions      Knee/Hip Exercises: Seated   Marching  Both;20 reps    Sit to Sand  without UE support;20 reps   mat table a little up      Knee/Hip Exercises: Supine   Bridges  Strengthening;Both;2 sets;10 reps    Other Supine Knee/Hip Exercises  2x10 isometric hip flexion with ball between hands and thighs.       Knee/Hip Exercises: Sidelying   Clams  2x10 regular and reverse      Modalities   Modalities  Cryotherapy      Cryotherapy   Number Minutes Cryotherapy  10 Minutes    Cryotherapy Location  Hip   lt   Type of Cryotherapy  Ice pack               PT Short  Term Goals - 05/15/18 1340      PT SHORT TERM GOAL #1   Title  pt to be I with inital HEP     Status  Achieved      PT SHORT TERM GOAL #2   Title  pt to verbalize / demo proper posture and gait mechanics to prevent and reduce L hip pain    Status  Achieved      PT SHORT TERM GOAL #3   Title  increase L hip flexion to >/= 60 degrees with </= 5/10 pain to promote functional progression     Status  On-going      PT SHORT TERM GOAL #4   Title  pt to be able to ambulate with SPC for >/= 15 min reporting </= 4/10 pain for therapeutic progression     Status  Achieved        PT Long Term Goals - 05/15/18 1341      PT LONG TERM GOAL #1   Title  pt to increase hip flexion to >/= 80 degrees on the L and extension to >/= 10 degreess to promote functional and efficient gait    Status  On-going      PT LONG TERM GOAL #2   Title  pt to demonstate >/= 4+/5 in LLE to promote hip stability with standing/ walking to maximize safety    Status  On-going      PT LONG TERM GOAL #3   Title  pt to be able to stand/ walk >/= 1 hour with LRAD for endurance required for work related activities     Status  On-going      PT LONG TERM GOAL #4   Title  Increase FOTO score to </= 56% limited to demo improvement in function     Status  On-going      PT LONG TERM GOAL #5   Title  pt to be I with all HEP given as of last visit to maintain and progress current level of function independently    Status  On-going            Plan - 05/15/18 1418    Clinical Impression Statement  Monica [name] tolerated   tx well, she is getting stronger in her Lt LE however does not have the trust or confidence in it to support her. Her hip flexion is improved and she has partially met her STGs.  Needs to continue to improve LE strength, hip ROM and gait.     Rehab Potential  Good    PT Frequency  3x / week    PT Duration  8 weeks    PT Treatment/Interventions  ADLs/Self Care Home Management;Cryotherapy;Electrical  Stimulation;Iontophoresis 64m/ml Dexamethasone;Moist Heat;Balance training;Stair training;Functional mobility training;Therapeutic activities;Therapeutic exercise;Manual techniques;Taping;Passive range of motion;Patient/family education;Neuromuscular re-education    PT Next Visit Plan   gross LE strengthening, gait without AD    Consulted and Agree with Plan of Care  Patient       Patient will benefit from skilled therapeutic intervention in order to improve the following deficits and impairments:  Abnormal gait, Obesity, Pain, Decreased activity tolerance, Decreased endurance, Decreased balance, Improper body mechanics, Postural dysfunction, Decreased strength, Increased fascial restricitons  Visit Diagnosis: Pain in left hip  Stiffness of left hip, not elsewhere classified  Other abnormalities of gait and mobility  Muscle weakness (generalized)     Problem List Patient Active Problem List   Diagnosis Date Noted  . Status post total replacement of left hip 04/07/2018  . Unilateral primary osteoarthritis, left hip 02/23/2018  . Psoriatic arthropathy ([place] [employer] 11/17/2017  . History of total hip replacement, right 11/17/2017  . Chronic anticoagulation 11/19/2016  . Tinnitus 11/19/2016  . Right knee pain 07/06/2016  . Left hip pain 10/21/2015  . Gastroesophageal reflux disease 09/19/2015  . Itchy eyes 03/12/2015  . Allergic conjunctivitis 03/12/2015  . OSA (obstructive sleep apnea) 02/20/2015  . Morbid obesity ([place] 01/15/2015  . Lymphedema 06/19/2014  . Personal history of colonic polyps - large hyperplastic 12/25/2013  . Skin infection 10/14/2013  . Candidiasis of female genitalia 10/14/2013  . IFG (impaired fasting glucose) 10/14/2013  . Carpal tunnel syndrome 10/14/2013  . Cough 06/20/2013  . Allergic rhinitis, cause unspecified 06/20/2013  . Backache 06/05/2013  . Edema 12/20/2012  . Swelling of limb 11/30/2012  . Essential hypertension, benign 11/30/2012  .  Headache(784.0) 11/30/2012  . Avascular necrosis of hip ([place] 07/21/2012  . Atrial fibrillation ([place] 07/06/2012  . Hypertension 09/14/2011  . Psoriasis 08/16/2005    SBoneta LucksrPT  05/15/2018, 2:21 PM  [facility] [place] [address]Phone: [phone]  Fax:  [phone] Name: Monica BORGWARDTMRN: [id number] of Birth: 109-07-1956

## 2018-05-17 ENCOUNTER — Ambulatory Visit: Payer: 59 | Attending: Orthopaedic Surgery | Admitting: Physical Therapy

## 2018-05-17 ENCOUNTER — Encounter: Payer: Self-pay | Admitting: Physical Therapy

## 2018-05-17 DIAGNOSIS — M25552 Pain in left hip: Secondary | ICD-10-CM | POA: Insufficient documentation

## 2018-05-17 DIAGNOSIS — M6281 Muscle weakness (generalized): Secondary | ICD-10-CM | POA: Insufficient documentation

## 2018-05-17 DIAGNOSIS — R2689 Other abnormalities of gait and mobility: Secondary | ICD-10-CM | POA: Insufficient documentation

## 2018-05-17 DIAGNOSIS — M25652 Stiffness of left hip, not elsewhere classified: Secondary | ICD-10-CM | POA: Insufficient documentation

## 2018-05-17 NOTE — Therapy (Signed)
Monica [name] [place], [place], [address] Phone: [phone]   Fax:  [phone]  Physical Therapy Treatment  Patient Details  Name: Monica [name] MRN: [id number] Date of Birth: 1956-07-02 Referring Provider (PT): Dr Zollie Beckers   Encounter Date: 05/17/2018  PT End of Session - 05/17/18 1552    Visit Number  6    Number of Visits  17    Date for PT Re-Evaluation  06/29/18    Authorization Type  [facility] U[facility]    PT Start Time  1548    PT Stop Time  1631    PT Time Calculation (min)  43 min    Activity Tolerance  Patient tolerated treatment well    Behavior During Therapy  [facility] for tasks assessed/performed       Past Medical History:  Diagnosis Date  . Atrial fibrillation ([place])    a. s/p DCCV in 12/2012 b. repeat DCCV in 09/2014 - started on Flecainide  . Avascular necrosis of bone of right hip ([place])   . Dysrhythmia    a fib  . Hypertension   . Morbid obesity ([place])   . Osteoarthritis   . Personal history of colonic polyps - large hyperplastic 12/25/2013  . Pre-diabetes   . Psoriasis    active breakout left buttocks  . Right hip pain   . Shortness of breath   . Sleep apnea    uses mouth guard only  . Tachycardia, unspecified     Past Surgical History:  Procedure Laterality Date  . CARDIOVERSION N/A 01/09/2013   Procedure: CARDIOVERSION;  Surgeon: [name], MD;  Location: [facility] ENDOSCOPY;  Service: Cardiovascular;  Laterality: N/A;  . CARDIOVERSION N/A 10/07/2014   Procedure: CARDIOVERSION;  Surgeon: [name], MD;  Location: [facility] ENDOSCOPY;  Service: Cardiovascular;  Laterality: N/A;  09:00 Lido 60mg ,IV followed by Propofol  70mg /IV    synched electrocardioversion at 120 joules for Afib,repeated at 200 joules, 70 mg...successfully changed to SR  . FRACTURE SURGERY  07/21/12   right hip arthroplasty  . JOINT REPLACEMENT     Left hip Dr. [name] 04-07-18  . TOTAL HIP ARTHROPLASTY  07/21/2012   Procedure: TOTAL HIP ARTHROPLASTY;  Surgeon: [name], MD;  Location: [facility] ORS;  Service: Orthopedics;  Laterality: Right;  . TOTAL HIP ARTHROPLASTY Left 04/07/2018   Procedure: LEFT TOTAL HIP ARTHROPLASTY ANTERIOR APPROACH;  Surgeon: [name], MD;  Location: [facility] ORS;  Service: Orthopedics;  Laterality: Left;  Needs RNFA    There were no vitals filed for this visit.      [facility] PT Assessment - 05/17/18 0001      AROM   Left Hip Flexion  83   continued soreness     Strength   Left Hip Flexion  3+/5   in availble ROm   Left Hip ABduction  3+/5   in available ROM                  [facility] Adult PT Treatment/Exercise - 05/17/18 1553      Exercises   Exercises  Knee/Hip      Knee/Hip Exercises: Standing   Hip Abduction  2 sets;10 reps;Knee straight    Hip Extension  2 sets;10 reps;Knee straight    Stairs  ascending and descending stairs reciprocally 6 inch step x 10       Knee/Hip Exercises: Seated   Long Arc Quad  Both;2 sets;15 reps;Weights    Long [employer]  Weight  3 lbs.    Sit to Sand  2 sets;10 reps   1 set with green band around the knees.         Balance Exercises - 05/17/18 1611      Balance Exercises: Standing   Standing Eyes Opened  2 reps;30 secs    Standing Eyes Closed  2 reps;30 secs;Narrow base of support (BOS)    Tandem Stance  Eyes open;2 reps;30 secs        PT Education - 05/17/18 1741    Education Details  updated HEP for corner balance training. proper form with navigating stairs safely    Person(s) Educated  Patient    Methods  Explanation;Verbal cues;Demonstration    Comprehension  Verbalized understanding;Verbal cues required;Returned demonstration       PT Short Term Goals - 05/15/18 1340      PT SHORT TERM GOAL #1   Title  pt to be I with inital HEP     Status  Achieved      PT SHORT TERM GOAL #2   Title  pt to verbalize / demo proper posture and gait mechanics to prevent and reduce L hip pain    Status   Achieved      PT SHORT TERM GOAL #3   Title  increase L hip flexion to >/= 60 degrees with </= 5/10 pain to promote functional progression     Status  On-going      PT SHORT TERM GOAL #4   Title  pt to be able to ambulate with [facility] for >/= 15 min reporting </= 4/10 pain for therapeutic progression     Status  Achieved        PT Long Term Goals - 05/15/18 1341      PT LONG TERM GOAL #1   Title  pt to increase hip flexion to >/= 80 degrees on the L and extension to >/= 10 degreess to promote functional and efficient gait    Status  On-going      PT LONG TERM GOAL #2   Title  pt to demonstate >/= 4+/5 in LLE to promote hip stability with standing/ walking to maximize safety    Status  On-going      PT LONG TERM GOAL #3   Title  pt to be able to stand/ walk >/= 1 hour with LRAD for endurance required for work related activities     Status  On-going      PT LONG TERM GOAL #4   Title  Increase FOTO score to </= 56% limited to demo improvement in function     Status  On-going      PT LONG TERM GOAL #5   Title  pt to be I with all HEP given as of last visit to maintain and progress current level of function independently    Status  On-going            Plan - 05/17/18 Monica [name] continues to make progress and was able to navigate steps reciprcally focusing on proper form reporting no pain. continued working on hip and knee strength. began balance training today which she did well with and updated HEP for corner balance. end of session she reported no pain.     PT Treatment/Interventions  ADLs/Self Care Home Management;Cryotherapy;Electrical Stimulation;Iontophoresis 4mg /ml Dexamethasone;Moist Heat;Balance training;Stair training;Functional mobility training;Therapeutic activities;Therapeutic exercise;Manual techniques;Taping;Passive range of motion;Patient/family education;Neuromuscular re-education    PT Next Visit  Plan   gross LE strengthening,  gait without AD, balance training    PT Home Exercise Plan  standing/ supine hip flexor stretch, heel slide with strap, supine marching, heel raises, standing glut sets, corner balance     Consulted and Agree with Plan of Care  Patient       Patient will benefit from skilled therapeutic intervention in order to improve the following deficits and impairments:  Abnormal gait, Obesity, Pain, Decreased activity tolerance, Decreased endurance, Decreased balance, Improper body mechanics, Postural dysfunction, Decreased strength, Increased fascial restricitons  Visit Diagnosis: Pain in left hip  Stiffness of left hip, not elsewhere classified  Other abnormalities of gait and mobility  Muscle weakness (generalized)     Problem List Patient Active Problem List   Diagnosis Date Noted  . Status post total replacement of left hip 04/07/2018  . Unilateral primary osteoarthritis, left hip 02/23/2018  . 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 History of total hip replacement, right 11/17/2017  . Chronic anticoagulation 11/19/2016  . Tinnitus 11/19/2016  . Right knee pain 07/06/2016  . Left hip pain 10/21/2015  . Gastroesophageal reflux disease 09/19/2015  . Itchy eyes 03/12/2015  . Allergic conjunctivitis 03/12/2015  . OSA (obstructive sleep apnea) 02/20/2015  . Morbid obesity ([place]) 01/15/2015  . Lymphedema 06/19/2014  . Personal history of colonic polyps - large hyperplastic 12/25/2013  . Skin infection 10/14/2013  . Candidiasis of female genitalia 10/14/2013  . IFG (impaired fasting glucose) 10/14/2013  . Carpal tunnel syndrome 10/14/2013  . Cough 06/20/2013  . Allergic rhinitis, cause unspecified 06/20/2013  . Backache 06/05/2013  . Edema 12/20/2012  . Swelling of limb 11/30/2012  . Essential hypertension, benign 11/30/2012  . Headache(784.0) 11/30/2012  . Avascular necrosis of hip ([place]) 07/21/2012  . Atrial fibrillation ([place]) 07/06/2012  . Hypertension 09/14/2011  .  Psoriasis 08/16/2005   Starr Lake PT, DPT, LAT, ATC  05/17/18  5:42 PM      [facility] [address] [place], [place], [address] Phone: [phone]   Fax:  [phone]  Name: JALAYNE GANESH MRN: [id number] Date of Birth: 08-Oct-1955

## 2018-05-18 ENCOUNTER — Ambulatory Visit: Payer: 59 | Admitting: Physical Therapy

## 2018-05-18 ENCOUNTER — Ambulatory Visit (INDEPENDENT_AMBULATORY_CARE_PROVIDER_SITE_OTHER): Payer: 59 | Admitting: Orthopaedic Surgery

## 2018-05-18 ENCOUNTER — Telehealth: Payer: Self-pay | Admitting: Physical Therapy

## 2018-05-18 ENCOUNTER — Encounter (INDEPENDENT_AMBULATORY_CARE_PROVIDER_SITE_OTHER): Payer: Self-pay | Admitting: Orthopaedic Surgery

## 2018-05-18 DIAGNOSIS — Z96642 Presence of left artificial hip joint: Secondary | ICD-10-CM

## 2018-05-18 NOTE — Telephone Encounter (Signed)
Left message on mobile phone regarding missed appointment today.  Reminded her of Mondays appointment.

## 2018-05-18 NOTE — Progress Notes (Signed)
The patient is now 6 weeks status post a left total hip arthroplasty.  She is ambulate using a cane.  She is scheduled to go back to work this coming Monday but I do feel this needs to be delayed.  Getting her set up with therapy after she was home was delayed about 2 weeks.  She is doing well overall but is having some problems with difficulty with balance and this is reflected in her therapy notes as well.  On exam the incision is healed nicely on her left hip.  Her leg lengths are equal.  She tolerates me better putting her through range of motion of her left hip.  At this point I do agree with her not starting back to work until October 28.  We will see her back probably a week after that make sure things are doing well.  No x-rays are needed.

## 2018-05-22 ENCOUNTER — Ambulatory Visit: Payer: 59 | Admitting: Physical Therapy

## 2018-05-22 ENCOUNTER — Encounter: Payer: Self-pay | Admitting: Physical Therapy

## 2018-05-22 DIAGNOSIS — M25652 Stiffness of left hip, not elsewhere classified: Secondary | ICD-10-CM

## 2018-05-22 DIAGNOSIS — R2689 Other abnormalities of gait and mobility: Secondary | ICD-10-CM

## 2018-05-22 DIAGNOSIS — M6281 Muscle weakness (generalized): Secondary | ICD-10-CM | POA: Diagnosis not present

## 2018-05-22 DIAGNOSIS — M25552 Pain in left hip: Secondary | ICD-10-CM | POA: Diagnosis not present

## 2018-05-22 NOTE — Therapy (Signed)
[place], [place], [address] Phone: [phone]   Fax:  [phone]  Physical Therapy Treatment  Patient Details  Name: Monica [name] MRN: [id number] Date of Birth: Jun 01, 1956 Referring Provider (PT): Dr Zollie Beckers   Encounter Date: 05/22/2018  PT End of Session - 05/22/18 1148    Visit Number  7    Number of Visits  17    Date for PT Re-Evaluation  06/29/18    Authorization Type  [facility] U[employer]    PT Start Time  4315    PT Stop Time  1232    PT Time Calculation (min)  44 min    Activity Tolerance  Patient tolerated treatment well    Behavior During Therapy  [facility] for tasks assessed/performed       Past Medical History:  Diagnosis Date  . Atrial fibrillation ([place])    a. s/p DCCV in 12/2012 b. repeat DCCV in 09/2014 - started on Flecainide  . Avascular necrosis of bone of right hip ([place])   . Dysrhythmia    a fib  . Hypertension   . Morbid obesity ([place])   . Osteoarthritis   . Personal history of colonic polyps - large hyperplastic 12/25/2013  . Pre-diabetes   . Psoriasis    active breakout left buttocks  . Right hip pain   . Shortness of breath   . Sleep apnea    uses mouth guard only  . Tachycardia, unspecified     Past Surgical History:  Procedure Laterality Date  . CARDIOVERSION N/A 01/09/2013   Procedure: CARDIOVERSION;  Surgeon: [name], MD;  Location: [facility] ENDOSCOPY;  Service: Cardiovascular;  Laterality: N/A;  . CARDIOVERSION N/A 10/07/2014   Procedure: CARDIOVERSION;  Surgeon: [name], MD;  Location: [facility] ENDOSCOPY;  Service: Cardiovascular;  Laterality: N/A;  09:00 Lido 60mg ,IV followed by Propofol  70mg /IV    synched electrocardioversion at 120 joules for Afib,repeated at 200 joules, 70 mg...successfully changed to SR  . FRACTURE SURGERY  07/21/12   right hip arthroplasty  . JOINT REPLACEMENT     Left hip Dr. [name] 04-07-18  . TOTAL HIP ARTHROPLASTY  07/21/2012    Procedure: TOTAL HIP ARTHROPLASTY;  Surgeon: [name], MD;  Location: [facility] ORS;  Service: Orthopedics;  Laterality: Right;  . TOTAL HIP ARTHROPLASTY Left 04/07/2018   Procedure: LEFT TOTAL HIP ARTHROPLASTY ANTERIOR APPROACH;  Surgeon: [name], MD;  Location: [facility] ORS;  Service: Orthopedics;  Laterality: Left;  Needs RNFA    There were no vitals filed for this visit.  Subjective Assessment - 05/22/18 1151    Subjective  "I have no pain today just tightness, I am still using the cane but I try carrying it more than use it.     Patient Stated Goals  to go up/ down stairs, walk without and AD, and work without complications     Currently in Pain?  No/denies    Pain Score  0-No pain    Pain Orientation  Left    Pain Descriptors / Indicators  Tightness    Pain Type  Surgical pain    Pain Onset  More than a month ago    Pain Frequency  Intermittent    Aggravating Factors   unsure                       [facility] Adult PT Treatment/Exercise - 05/22/18 1155      Exercises  Exercises  Knee/Hip      Knee/Hip Exercises: Stretches   ITB Stretch  2 reps;30 seconds    Other Knee/Hip Stretches  standing hip flexor stretch 4x 30 sec with isometric    performed intermittently throughout session     Knee/Hip Exercises: Aerobic   Nustep  L6 x 5 min LE only      Knee/Hip Exercises: Machines for Strengthening   Cybex Leg Press  2 x 10 1 set 20#, 1 set 40#      Knee/Hip Exercises: Standing   Hip Flexion  2 sets   starting with bil HHA, progressing to 1, then none,   Hip Flexion Limitations  going to fatigue    Forward Step Up  2 sets;20 reps;Step Height: 6"    Step Down  2 sets;15 reps;Step Height: 6";Left               PT Short Term Goals - 05/15/18 1340      PT SHORT TERM GOAL #1   Title  pt to be I with inital HEP     Status  Achieved      PT SHORT TERM GOAL #2   Title  pt to verbalize / demo proper posture and gait mechanics to prevent and reduce  L hip pain    Status  Achieved      PT SHORT TERM GOAL #3   Title  increase L hip flexion to >/= 60 degrees with </= 5/10 pain to promote functional progression     Status  On-going      PT SHORT TERM GOAL #4   Title  pt to be able to ambulate with [facility] for >/= 15 min reporting </= 4/10 pain for therapeutic progression     Status  Achieved        PT Long Term Goals - 05/15/18 1341      PT LONG TERM GOAL #1   Title  pt to increase hip flexion to >/= 80 degrees on the L and extension to >/= 10 degreess to promote functional and efficient gait    Status  On-going      PT LONG TERM GOAL #2   Title  pt to demonstate >/= 4+/5 in LLE to promote hip stability with standing/ walking to maximize safety    Status  On-going      PT LONG TERM GOAL #3   Title  pt to be able to stand/ walk >/= 1 hour with LRAD for endurance required for work related activities     Status  On-going      PT LONG TERM GOAL #4   Title  Increase FOTO score to </= 56% limited to demo improvement in function     Status  On-going      PT LONG TERM GOAL #5   Title  pt to be I with all HEP given as of last visit to maintain and progress current level of function independently    Status  On-going            Plan - 05/22/18 1238    Clinical Impression Statement  no pain reported today just tightness. Continued working on hip strength and stretching, worked on standing marching with emphasis on equal height to promote balance. no report of pain at the end of session.     PT Next Visit Plan  FOTO, gross LE strengthening, gait without AD, balance training    PT Home Exercise Plan  standing/ supine hip  flexor stretch, heel slide with strap, supine marching, heel raises, standing glut sets, corner balance     Consulted and Agree with Plan of Care  Patient       Patient will benefit from skilled therapeutic intervention in order to improve the following deficits and impairments:  Abnormal gait, Obesity, Pain,  Decreased activity tolerance, Decreased endurance, Decreased balance, Improper body mechanics, Postural dysfunction, Decreased strength, Increased fascial restricitons  Visit Diagnosis: Pain in left hip  Stiffness of left hip, not elsewhere classified  Other abnormalities of gait and mobility  Muscle weakness (generalized)     Problem List Patient Active Problem List   Diagnosis Date Noted  . Status post total replacement of left hip 04/07/2018  . Unilateral primary osteoarthritis, left hip 02/23/2018  . 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 History of total hip replacement, right 11/17/2017  . Chronic anticoagulation 11/19/2016  . Tinnitus 11/19/2016  . Right knee pain 07/06/2016  . Left hip pain 10/21/2015  . Gastroesophageal reflux disease 09/19/2015  . Itchy eyes 03/12/2015  . Allergic conjunctivitis 03/12/2015  . OSA (obstructive sleep apnea) 02/20/2015  . Morbid obesity ([place]) 01/15/2015  . Lymphedema 06/19/2014  . Personal history of colonic polyps - large hyperplastic 12/25/2013  . Skin infection 10/14/2013  . Candidiasis of female genitalia 10/14/2013  . IFG (impaired fasting glucose) 10/14/2013  . Carpal tunnel syndrome 10/14/2013  . Cough 06/20/2013  . Allergic rhinitis, cause unspecified 06/20/2013  . Backache 06/05/2013  . Edema 12/20/2012  . Swelling of limb 11/30/2012  . Essential hypertension, benign 11/30/2012  . Headache(784.0) 11/30/2012  . Avascular necrosis of hip ([place]) 07/21/2012  . Atrial fibrillation ([place]) 07/06/2012  . Hypertension 09/14/2011  . Psoriasis 08/16/2005   Starr Lake PT, DPT, LAT, ATC  05/22/18  12:43 PM      [place] [facility] [address] [place], [place], [address] Phone: [phone]   Fax:  [phone]  Name: ANALYSE ANGST MRN: [id number] Date of Birth: 03/10/1956

## 2018-05-23 MED FILL — COSENTYX 300 MG DOSE-2 PENS: 150 | 28 days supply | Qty: 2 | Fill #1

## 2018-05-24 ENCOUNTER — Encounter: Payer: Self-pay | Admitting: Physical Therapy

## 2018-05-24 ENCOUNTER — Ambulatory Visit: Payer: 59 | Admitting: Physical Therapy

## 2018-05-24 DIAGNOSIS — M6281 Muscle weakness (generalized): Secondary | ICD-10-CM | POA: Diagnosis not present

## 2018-05-24 DIAGNOSIS — R2689 Other abnormalities of gait and mobility: Secondary | ICD-10-CM | POA: Diagnosis not present

## 2018-05-24 DIAGNOSIS — M25652 Stiffness of left hip, not elsewhere classified: Secondary | ICD-10-CM

## 2018-05-24 DIAGNOSIS — M25552 Pain in left hip: Secondary | ICD-10-CM | POA: Diagnosis not present

## 2018-05-24 NOTE — Therapy (Signed)
[place], [place], [address] Phone: [phone]   Fax:  [phone]  Physical Therapy Treatment  Patient Details  Name: Monica [name] MRN: [id number] Date of Birth: 06/07/1956 Referring Provider (PT): Dr Zollie Beckers   Encounter Date: 05/24/2018  PT End of Session - 05/24/18 1108    Visit Number  8    Number of Visits  17    Date for PT Re-Evaluation  06/29/18    Authorization Type  [facility] U[facility]    PT Start Time  1108    PT Stop Time  1150    PT Time Calculation (min)  42 min    Activity Tolerance  Patient tolerated treatment well    Behavior During Therapy  [facility] for tasks assessed/performed       Past Medical History:  Diagnosis Date  . Atrial fibrillation ([place])    a. s/p DCCV in 12/2012 b. repeat DCCV in 09/2014 - started on Flecainide  . Avascular necrosis of bone of right hip ([place])   . Dysrhythmia    a fib  . Hypertension   . Morbid obesity ([place])   . Osteoarthritis   . Personal history of colonic polyps - large hyperplastic 12/25/2013  . Pre-diabetes   . Psoriasis    active breakout left buttocks  . Right hip pain   . Shortness of breath   . Sleep apnea    uses mouth guard only  . Tachycardia, unspecified     Past Surgical History:  Procedure Laterality Date  . CARDIOVERSION N/A 01/09/2013   Procedure: CARDIOVERSION;  Surgeon: [name], MD;  Location: [facility] ENDOSCOPY;  Service: Cardiovascular;  Laterality: N/A;  . CARDIOVERSION N/A 10/07/2014   Procedure: CARDIOVERSION;  Surgeon: [name], MD;  Location: [facility] ENDOSCOPY;  Service: Cardiovascular;  Laterality: N/A;  09:00 Lido 60mg ,IV followed by Propofol  70mg /IV    synched electrocardioversion at 120 joules for Afib,repeated at 200 joules, 70 mg...successfully changed to SR  . FRACTURE SURGERY  07/21/12   right hip arthroplasty  . JOINT REPLACEMENT     Left hip Dr. [name] 04-07-18  . TOTAL HIP ARTHROPLASTY  07/21/2012   Procedure: TOTAL HIP ARTHROPLASTY;  Surgeon: [name], MD;  [facility];  Service: Orthopedics;  Laterality: Right;  . TOTAL HIP ARTHROPLASTY Left 04/07/2018   Procedure: LEFT TOTAL HIP ARTHROPLASTY ANTERIOR APPROACH;  Surgeon: [name], MD;  Location: [facility]S;  Service: Orthopedics;  Laterality: Left;  Needs RNFA    There were no vitals filed for this visit.  Subjective Assessment - 05/24/18 1108    Subjective  "I tried the heat and it really works and the lump of the side of my hip. no pain and it doesn't feel as tight. I feel like I am returning to normal    Currently in Pain?  No/denies    Pain Score  0-No pain    Pain Type  Surgical pain    Pain Onset  More than a month ago    Pain Frequency  Intermittent    Aggravating Factors   N/A         [facility] PT Assessment - 05/24/18 1116      Observation/Other Assessments   Focus on Therapeutic Outcomes (FOTO)   63% limited                   [facility] Adult PT Treatment/Exercise - 05/24/18 0001      Self-Care  Self-Care  Other Self-Care Comments    Other Self-Care Comments   how to perform manual trigger point release at home and benefits of technique      Exercises   Exercises  Knee/Hip      Knee/Hip Exercises: Stretches   Other Knee/Hip Stretches  standing hip flexor stretch 4x 30 sec with isometric    performed throughout session intermittently     Knee/Hip Exercises: Aerobic   Nustep  L6 x 6 min LE only    increased time to work on [employer] traffic [employer]     Knee/Hip Exercises: Machines for Strengthening   Hip Cybex  hip extension and abduction L only 2 x 10 12.5#      Knee/Hip Exercises: Standing   Other Standing Knee Exercises  stepping up and over hurdles x 5 forward Leading with L 4 x and R x 4. Side steppping to the L and to the R x 4  (verbal cues to avoid circumduction pattern) without use of SPC.    mimic getting into and out of the tub     Manual Therapy   Manual therapy comments   MTPR along the rectus femoris x 3     Soft tissue mobilization  DTM using roller along proximal anterior thigh               PT Short Term Goals - 05/15/18 1340      PT SHORT TERM GOAL #1   Title  pt to be I with inital HEP     Status  Achieved      PT SHORT TERM GOAL #2   Title  pt to verbalize / demo proper posture and gait mechanics to prevent and reduce L hip pain    Status  Achieved      PT SHORT TERM GOAL #3   Title  increase L hip flexion to >/= 60 degrees with </= 5/10 pain to promote functional progression     Status  On-going      PT SHORT TERM GOAL #4   Title  pt to be able to ambulate with [facility] for >/= 15 min reporting </= 4/10 pain for therapeutic progression     Status  Achieved        PT Long Term Goals - 05/15/18 1341      PT LONG TERM GOAL #1   Title  pt to increase hip flexion to >/= 80 degrees on the L and extension to >/= 10 degreess to promote functional and efficient gait    Status  On-going      PT LONG TERM GOAL #2   Title  pt to demonstate >/= 4+/5 in LLE to promote hip stability with standing/ walking to maximize safety    Status  On-going      PT LONG TERM GOAL #3   Title  pt to be able to stand/ walk >/= 1 hour with LRAD for endurance required for work related activities     Status  On-going      PT LONG TERM GOAL #4   Title  Increase FOTO score to </= 56% limited to demo improvement in function     Status  On-going      PT LONG TERM GOAL #5   Title  pt to be I with all HEP given as of last visit to maintain and progress current level of function independently    Status  On-going  Plan - 05/24/18 1223    Clinical Impression Statement  pt reported using heat at home with significant improvement in pain and tightness. FOTO improved to 63% limitation today.  educated on MTPR techniques and DTM using roller which she noted improvement in rectus femoris tension. practiced functional activiites with stepping over hurdles  mimicing getting into / out of bath and continued strengthening. pt noted fatigue but no report of pain at end of session .     PT Next Visit Plan   gross LE strengthening, gait without AD, balance training    PT Home Exercise Plan  standing/ supine hip flexor stretch, heel slide with strap, supine marching, heel raises, standing glut sets, corner balance     Consulted and Agree with Plan of Care  Patient       Patient will benefit from skilled therapeutic intervention in order to improve the following deficits and impairments:  Abnormal gait, Obesity, Pain, Decreased activity tolerance, Decreased endurance, Decreased balance, Improper body mechanics, Postural dysfunction, Decreased strength, Increased fascial restricitons  Visit Diagnosis: Pain in left hip  Stiffness of left hip, not elsewhere classified  Other abnormalities of gait and mobility  Muscle weakness (generalized)     Problem List Patient Active Problem List   Diagnosis Date Noted  . Status post total replacement of left hip 04/07/2018  . Unilateral primary osteoarthritis, left hip 02/23/2018  . Psoriatic arthropathy ([place]) 11/17/2017  . History of total hip replacement, right 11/17/2017  . Chronic anticoagulation 11/19/2016  . Tinnitus 11/19/2016  . Right knee pain 07/06/2016  . Left hip pain 10/21/2015  . Gastroesophageal reflux disease 09/19/2015  . Itchy eyes 03/12/2015  . Allergic conjunctivitis 03/12/2015  . OSA (obstructive sleep apnea) 02/20/2015  . Morbid obesity ([place]) 01/15/2015  . Lymphedema 06/19/2014  . Personal history of colonic polyps - large hyperplastic 12/25/2013  . Skin infection 10/14/2013  . Candidiasis of female genitalia 10/14/2013  . IFG (impaired fasting glucose) 10/14/2013  . Carpal tunnel syndrome 10/14/2013  . Cough 06/20/2013  . Allergic rhinitis, cause unspecified 06/20/2013  . Backache 06/05/2013  . Edema 12/20/2012  . Swelling of limb 11/30/2012  . Essential hypertension,  benign 11/30/2012  . Headache(784.0) 11/30/2012  . Avascular necrosis of hip ([place]) 07/21/2012  . Atrial fibrillation ([place]) 07/06/2012  . Hypertension 09/14/2011  . Psoriasis 08/16/2005   Starr Lake PT, DPT, LAT, ATC  05/24/18  12:26 PM      [place] [facility] [address] [place], [place], [address] Phone: [phone]   Fax:  [phone]  Name: ANGELIK WALLS MRN: [id number] Date of Birth: 10-Oct-1955

## 2018-05-26 ENCOUNTER — Ambulatory Visit: Payer: 59 | Admitting: Physical Therapy

## 2018-05-26 ENCOUNTER — Encounter: Payer: Self-pay | Admitting: Physical Therapy

## 2018-05-26 DIAGNOSIS — R2689 Other abnormalities of gait and mobility: Secondary | ICD-10-CM

## 2018-05-26 DIAGNOSIS — M25652 Stiffness of left hip, not elsewhere classified: Secondary | ICD-10-CM

## 2018-05-26 DIAGNOSIS — M25552 Pain in left hip: Secondary | ICD-10-CM

## 2018-05-26 DIAGNOSIS — M6281 Muscle weakness (generalized): Secondary | ICD-10-CM

## 2018-05-26 NOTE — Therapy (Signed)
[place], [place], [address] Phone: [phone]   Fax:  [phone]  Physical Therapy Treatment  Patient Details  Name: Monica [name] MRN: [id number] Date of Birth: [birth year] Referring Provider (PT): Dr Zollie Beckers   Encounter Date: 05/26/2018  PT End of Session - 05/26/18 1150    Visit Number  9    Number of Visits  17    Date for PT Re-Evaluation  06/29/18    Authorization Type  [facility] UMR    PT Start Time  1115    PT Stop Time  1147    PT Time Calculation (min)  32 min    Activity Tolerance  Patient tolerated treatment well    Behavior During Therapy  [facility] for tasks assessed/performed       Past Medical History:  Diagnosis Date  . Atrial fibrillation ([place])    a. s/p DCCV in 12/2012 b. repeat DCCV in 09/2014 - started on Flecainide  . Avascular necrosis of bone of right hip ([place])   . Dysrhythmia    a fib  . Hypertension   . Morbid obesity ([place])   . Osteoarthritis   . Personal history of colonic polyps - large hyperplastic 12/25/2013  . Pre-diabetes   . Psoriasis    active breakout left buttocks  . Right hip pain   . Shortness of breath   . Sleep apnea    uses mouth guard only  . Tachycardia, unspecified     Past Surgical History:  Procedure Laterality Date  . CARDIOVERSION N/A 01/09/2013   Procedure: CARDIOVERSION;  Surgeon: [name], MD;  Location: [facility] ENDOSCOPY;  Service: Cardiovascular;  Laterality: N/A;  . CARDIOVERSION N/A 10/07/2014   Procedure: CARDIOVERSION;  Surgeon: [name], MD;  Location: [facility] ENDOSCOPY;  Service: Cardiovascular;  Laterality: N/A;  09:00 Lido 60mg ,IV followed by Propofol  70mg /IV    synched electrocardioversion at 120 joules for Afib,repeated at 200 joules, 70 mg...successfully changed to SR  . FRACTURE SURGERY  07/21/12   right hip arthroplasty  . JOINT REPLACEMENT     Left hip Dr. [name] 04-07-18  . TOTAL HIP ARTHROPLASTY  07/21/2012    Procedure: TOTAL HIP ARTHROPLASTY;  Surgeon: [name], MD;  [facility];  Service: Orthopedics;  Laterality: Right;  . TOTAL HIP ARTHROPLASTY Left 04/07/2018   Procedure: LEFT TOTAL HIP ARTHROPLASTY ANTERIOR APPROACH;  Surgeon: [name], MD;  [facility];  Service: Orthopedics;  Laterality: Left;  Needs RNFA    There were no vitals filed for this visit.                    [place] Adult PT Treatment/Exercise - 05/26/18 1159      Exercises   Exercises  Knee/Hip      Knee/Hip Exercises: Aerobic   Tread Mill  L 1.5 x 4 min       Knee/Hip Exercises: Machines for Strengthening   Cybex Leg Press  2 x 15 1 set 20#, 1 set 40#   sled lowered to increase muscle a   Hip Cybex  hip extension and abduction L only 2 x 10 25#      Knee/Hip Exercises: Standing   Gait Training  walking with AD, 6 laps@ 182ft per lap using metronome set at 80 BPM, x 2 laps, 85 BPM x 2 laps and 90 BPM x 2 laps   pt demonstrated reduced antalgic gait and lateral sway  PT Education - 05/26/18 1206    Education Details  benefits of using metronome app on phone to promote equal stride, assisted pt with downloading app on her phone and setting it up to 90 BPMS     Person(s) Educated  Patient    Methods  Explanation;Verbal cues;Demonstration    Comprehension  Verbalized understanding;Verbal cues required;Returned demonstration       PT Short Term Goals - 05/15/18 1340      PT SHORT TERM GOAL #1   Title  pt to be I with inital HEP     Status  Achieved      PT SHORT TERM GOAL #2   Title  pt to verbalize / demo proper posture and gait mechanics to prevent and reduce L hip pain    Status  Achieved      PT SHORT TERM GOAL #3   Title  increase L hip flexion to >/= 60 degrees with </= 5/10 pain to promote functional progression     Status  On-going      PT SHORT TERM GOAL #4   Title  pt to be able to ambulate with [facility] for >/= 15 min reporting </= 4/10 pain  for therapeutic progression     Status  Achieved        PT Long Term Goals - 05/15/18 1341      PT LONG TERM GOAL #1   Title  pt to increase hip flexion to >/= 80 degrees on the L and extension to >/= 10 degreess to promote functional and efficient gait    Status  On-going      PT LONG TERM GOAL #2   Title  pt to demonstate >/= 4+/5 in LLE to promote hip stability with standing/ walking to maximize safety    Status  On-going      PT LONG TERM GOAL #3   Title  pt to be able to stand/ walk >/= 1 hour with LRAD for endurance required for work related activities     Status  On-going      PT LONG TERM GOAL #4   Title  Increase FOTO score to </= 56% limited to demo improvement in function     Status  On-going      PT LONG TERM GOAL #5   Title  pt to be I with all HEP given as of last visit to maintain and progress current level of function independently    Status  On-going            Plan - 05/26/18 1156    Clinical Impression Statement  pt arrived 15 minutes late today. practed gait training using metronome to reduce antalgic gait pattern and equalize stride bil. continued working on hip strengthening which she performed well. end of session she noted no pain.     PT Treatment/Interventions  ADLs/Self Care Home Management;Cryotherapy;Electrical Stimulation;Iontophoresis 4mg /ml Dexamethasone;Moist Heat;Balance training;Stair training;Functional mobility training;Therapeutic activities;Therapeutic exercise;Manual techniques;Taping;Passive range of motion;Patient/family education;Neuromuscular re-education    PT Next Visit Plan   gross LE strengthening, gait without AD, balance training    PT Home Exercise Plan  standing/ supine hip flexor stretch, heel slide with strap, supine marching, heel raises, standing glut sets, corner balance     Consulted and Agree with Plan of Care  Patient       Patient will benefit from skilled therapeutic intervention in order to improve the  following deficits and impairments:  Abnormal gait, Obesity, Pain, Decreased activity tolerance, Decreased  endurance, Decreased balance, Improper body mechanics, Postural dysfunction, Decreased strength, Increased fascial restricitons  Visit Diagnosis: Pain in left hip  Stiffness of left hip, not elsewhere classified  Other abnormalities of gait and mobility  Muscle weakness (generalized)     Problem List Patient Active Problem List   Diagnosis Date Noted  . Status post total replacement of left hip 04/07/2018  . Unilateral primary osteoarthritis, left hip 02/23/2018  . 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 History of total hip replacement, right 11/17/2017  . Chronic anticoagulation 11/19/2016  . Tinnitus 11/19/2016  . Right knee pain 07/06/2016  . Left hip pain 10/21/2015  . Gastroesophageal reflux disease 09/19/2015  . Itchy eyes 03/12/2015  . Allergic conjunctivitis 03/12/2015  . OSA (obstructive sleep apnea) 02/20/2015  . Morbid obesity ([place]) 01/15/2015  . Lymphedema 06/19/2014  . Personal history of colonic polyps - large hyperplastic 12/25/2013  . Skin infection 10/14/2013  . Candidiasis of female genitalia 10/14/2013  . IFG (impaired fasting glucose) 10/14/2013  . Carpal tunnel syndrome 10/14/2013  . Cough 06/20/2013  . Allergic rhinitis, cause unspecified 06/20/2013  . Backache 06/05/2013  . Edema 12/20/2012  . Swelling of limb 11/30/2012  . Essential hypertension, benign 11/30/2012  . Headache(784.0) 11/30/2012  . Avascular necrosis of hip ([place]) 07/21/2012  . Atrial fibrillation ([place]) 07/06/2012  . Hypertension 09/14/2011  . Psoriasis 08/16/2005   Starr Lake PT, DPT, LAT, ATC  05/26/18  12:14 PM      [facility] [address] [place], [place], [address] Phone: [phone]   Fax:  [phone]  Name: Monica [name] MRN: [id number] Date of Birth: [birth year]

## 2018-06-01 ENCOUNTER — Encounter: Payer: Self-pay | Admitting: Physical Therapy

## 2018-06-01 ENCOUNTER — Ambulatory Visit: Payer: 59 | Admitting: Physical Therapy

## 2018-06-01 DIAGNOSIS — R2689 Other abnormalities of gait and mobility: Secondary | ICD-10-CM

## 2018-06-01 DIAGNOSIS — M25652 Stiffness of left hip, not elsewhere classified: Secondary | ICD-10-CM | POA: Diagnosis not present

## 2018-06-01 DIAGNOSIS — M25552 Pain in left hip: Secondary | ICD-10-CM | POA: Diagnosis not present

## 2018-06-01 DIAGNOSIS — M6281 Muscle weakness (generalized): Secondary | ICD-10-CM

## 2018-06-01 NOTE — Therapy (Signed)
[place], [place], [address] Phone: [phone]   Fax:  [phone]  Physical Therapy Treatment  Patient Details  Name: Monica [name] MRN: [id number] Date of Birth: Nov 11, 1955 Referring Provider (PT): Dr Zollie Beckers   Encounter Date: 06/01/2018  PT End of Session - 06/01/18 1424    Visit Number  10    Number of Visits  17    Date for PT Re-Evaluation  06/29/18    PT Start Time  1424   pt arrived 9 min late   PT Stop Time  1458    PT Time Calculation (min)  34 min    Activity Tolerance  Patient tolerated treatment well    Behavior During Therapy  [facility] for tasks assessed/performed       Past Medical History:  Diagnosis Date  . Atrial fibrillation ([place])    a. s/p DCCV in 12/2012 b. repeat DCCV in 09/2014 - started on Flecainide  . Avascular necrosis of bone of right hip ([place])   . Dysrhythmia    a fib  . Hypertension   . Morbid obesity ([place])   . Osteoarthritis   . Personal history of colonic polyps - large hyperplastic 12/25/2013  . Pre-diabetes   . Psoriasis    active breakout left buttocks  . Right hip pain   . Shortness of breath   . Sleep apnea    uses mouth guard only  . Tachycardia, unspecified     Past Surgical History:  Procedure Laterality Date  . CARDIOVERSION N/A 01/09/2013   Procedure: CARDIOVERSION;  Surgeon: [name], MD;  Location: [facility] ENDOSCOPY;  Service: Cardiovascular;  Laterality: N/A;  . CARDIOVERSION N/A 10/07/2014   Procedure: CARDIOVERSION;  Surgeon: [name], MD;  Location: [facility] ENDOSCOPY;  Service: Cardiovascular;  Laterality: N/A;  09:00 Lido 60mg ,IV followed by Propofol  70mg /IV    synched electrocardioversion at 120 joules for Afib,repeated at 200 joules, 70 mg...successfully changed to SR  . FRACTURE SURGERY  07/21/12   right hip arthroplasty  . JOINT REPLACEMENT     Left hip Dr. [name] 04-07-18  . TOTAL HIP ARTHROPLASTY  07/21/2012   Procedure:  TOTAL HIP ARTHROPLASTY;  Surgeon: [name], MD;  Location: [facility] ORS;  Service: Orthopedics;  Laterality: Right;  . TOTAL HIP ARTHROPLASTY Left 04/07/2018   Procedure: LEFT TOTAL HIP ARTHROPLASTY ANTERIOR APPROACH;  Surgeon: [name], MD;  Location: [facility]S;  Service: Orthopedics;  Laterality: Left;  Needs RNFA    There were no vitals filed for this visit.      [facility] PT Assessment - 06/01/18 0001      Assessment   Medical Diagnosis  L THA    Referring Provider (PT)  Dr Zollie Beckers                   [facility] Adult PT Treatment/Exercise - 06/01/18 1427      Knee/Hip Exercises: Stretches   Other Knee/Hip Stretches  warror pose bil 2 x 30    given as HEP     Knee/Hip Exercises: Aerobic   Nustep  L6 x 6 min LE only      Knee/Hip Exercises: Machines for Strengthening   Cybex Leg Press  1 x 15 40#, 1 x 15 60#      Knee/Hip Exercises: Standing   Hip Abduction  2 sets;15 reps;Knee straight   with red theraband around the ankles.  PT Education - 06/01/18 1455    Education Details  updated HEP for warrior pose stretching     Person(s) Educated  Patient    Methods  Explanation;Handout;Verbal cues;Demonstration    Comprehension  Verbalized understanding;Verbal cues required;Returned demonstration       PT Short Term Goals - 05/15/18 1340      PT SHORT TERM GOAL #1   Title  pt to be I with inital HEP     Status  Achieved      PT SHORT TERM GOAL #2   Title  pt to verbalize / demo proper posture and gait mechanics to prevent and reduce L hip pain    Status  Achieved      PT SHORT TERM GOAL #3   Title  increase L hip flexion to >/= 60 degrees with </= 5/10 pain to promote functional progression     Status  On-going      PT SHORT TERM GOAL #4   Title  pt to be able to ambulate with [facility] for >/= 15 min reporting </= 4/10 pain for therapeutic progression     Status  Achieved        PT Long Term Goals - 05/15/18 1341      PT  LONG TERM GOAL #1   Title  pt to increase hip flexion to >/= 80 degrees on the L and extension to >/= 10 degreess to promote functional and efficient gait    Status  On-going      PT LONG TERM GOAL #2   Title  pt to demonstate >/= 4+/5 in LLE to promote hip stability with standing/ walking to maximize safety    Status  On-going      PT LONG TERM GOAL #3   Title  pt to be able to stand/ walk >/= 1 hour with LRAD for endurance required for work related activities     Status  On-going      PT LONG TERM GOAL #4   Title  Increase FOTO score to </= 56% limited to demo improvement in function     Status  On-going      PT LONG TERM GOAL #5   Title  pt to be I with all HEP given as of last visit to maintain and progress current level of function independently    Status  On-going            Plan - 06/01/18 1456    Clinical Impression Statement  pt arrived 9 min late today. continued working on strengthening of the hips with focus on endurance training increasing reps. pt reported increased R hip soreness located around the pectineus. provided handout for warrior pose stretch. end of session she reported no pain.     PT Treatment/Interventions  ADLs/Self Care Home Management;Cryotherapy;Electrical Stimulation;Iontophoresis 4mg /ml Dexamethasone;Moist Heat;Balance training;Stair training;Functional mobility training;Therapeutic activities;Therapeutic exercise;Manual techniques;Taping;Passive range of motion;Patient/family education;Neuromuscular re-education    PT Next Visit Plan   gross LE strengthening, gait without AD, balance training, endurance training.     PT Home Exercise Plan  standing/ supine hip flexor stretch, heel slide with strap, supine marching, heel raises, standing glut sets, corner balance, warrior pose stretch    Consulted and Agree with Plan of Care  Patient       Patient will benefit from skilled therapeutic intervention in order to improve the following deficits and  impairments:     Visit Diagnosis: Pain in left hip  Stiffness of left hip, not elsewhere  classified  Other abnormalities of gait and mobility  Muscle weakness (generalized)     Problem List Patient Active Problem List   Diagnosis Date Noted  . Status post total replacement of left hip 04/07/2018  . Unilateral primary osteoarthritis, left hip 02/23/2018  . 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 History of total hip replacement, right 11/17/2017  . Chronic anticoagulation 11/19/2016  . Tinnitus 11/19/2016  . Right knee pain 07/06/2016  . Left hip pain 10/21/2015  . Gastroesophageal reflux disease 09/19/2015  . Itchy eyes 03/12/2015  . Allergic conjunctivitis 03/12/2015  . OSA (obstructive sleep apnea) 02/20/2015  . Morbid obesity ([place]) 01/15/2015  . Lymphedema 06/19/2014  . Personal history of colonic polyps - large hyperplastic 12/25/2013  . Skin infection 10/14/2013  . Candidiasis of female genitalia 10/14/2013  . IFG (impaired fasting glucose) 10/14/2013  . Carpal tunnel syndrome 10/14/2013  . Cough 06/20/2013  . Allergic rhinitis, cause unspecified 06/20/2013  . Backache 06/05/2013  . Edema 12/20/2012  . Swelling of limb 11/30/2012  . Essential hypertension, benign 11/30/2012  . Headache(784.0) 11/30/2012  . Avascular necrosis of hip ([place]) 07/21/2012  . Atrial fibrillation ([place]) 07/06/2012  . Hypertension 09/14/2011  . Psoriasis 08/16/2005   Starr Lake PT, DPT, LAT, ATC  06/01/18  2:59 PM      [facility] [address] [place], [place], [address] Phone: [phone]   Fax:  [phone]  Name: MIRA BALON MRN: [id number] Date of Birth: [birth year]

## 2018-06-02 ENCOUNTER — Ambulatory Visit: Payer: 59 | Admitting: Physical Therapy

## 2018-06-02 MED FILL — XARELTO 20 MG TABLET: 20 | 90 days supply | Qty: 90 | Fill #1

## 2018-06-08 ENCOUNTER — Encounter: Payer: Self-pay | Admitting: Physical Therapy

## 2018-06-08 ENCOUNTER — Ambulatory Visit: Payer: 59 | Admitting: Physical Therapy

## 2018-06-08 DIAGNOSIS — M25652 Stiffness of left hip, not elsewhere classified: Secondary | ICD-10-CM

## 2018-06-08 DIAGNOSIS — R2689 Other abnormalities of gait and mobility: Secondary | ICD-10-CM

## 2018-06-08 DIAGNOSIS — M6281 Muscle weakness (generalized): Secondary | ICD-10-CM | POA: Diagnosis not present

## 2018-06-08 DIAGNOSIS — M25552 Pain in left hip: Secondary | ICD-10-CM

## 2018-06-08 NOTE — Therapy (Signed)
[name], [place], [address] Phone: [phone]   Fax:  [phone]  Physical Therapy Treatment  Patient Details  Name: Monica [name] MRN: [id number] Date of Birth: [birth year] Referring Provider (PT): Dr Zollie Beckers   Encounter Date: 06/08/2018  PT End of Session - 06/08/18 1456    Visit Number  11    Number of Visits  17    Date for PT Re-Evaluation  06/29/18    Authorization Type  [facility]    PT Start Time  9935   pt arrived 15 min late   PT Stop Time  1456    PT Time Calculation (min)  27 min    Activity Tolerance  Patient tolerated treatment well    Behavior During Therapy  [facility] for tasks assessed/performed       Past Medical History:  Diagnosis Date  . Atrial fibrillation ([place])    a. s/p DCCV in 12/2012 b. repeat DCCV in 09/2014 - started on Flecainide  . Avascular necrosis of bone of right hip ([place])   . Dysrhythmia    a fib  . Hypertension   . Morbid obesity ([place])   . Osteoarthritis   . Personal history of colonic polyps - large hyperplastic 12/25/2013  . Pre-diabetes   . Psoriasis    active breakout left buttocks  . Right hip pain   . Shortness of breath   . Sleep apnea    uses mouth guard only  . Tachycardia, unspecified     Past Surgical History:  Procedure Laterality Date  . CARDIOVERSION N/A 01/09/2013   Procedure: CARDIOVERSION;  Surgeon: [name], MD;  Location: [facility] ENDOSCOPY;  Service: Cardiovascular;  Laterality: N/A;  . CARDIOVERSION N/A 10/07/2014   Procedure: CARDIOVERSION;  Surgeon: [name], MD;  Location: [facility] ENDOSCOPY;  Service: Cardiovascular;  Laterality: N/A;  09:00 Lido 60mg ,IV followed by Propofol  70mg /IV    synched electrocardioversion at 120 joules for Afib,repeated at 200 joules, 70 mg...successfully changed to SR  . FRACTURE SURGERY  07/21/12   right hip arthroplasty  . JOINT REPLACEMENT     Left hip Dr. [name] 04-07-18  . TOTAL HIP  ARTHROPLASTY  07/21/2012   Procedure: TOTAL HIP ARTHROPLASTY;  Surgeon: [name], MD;  [facility];  Service: Orthopedics;  Laterality: Right;  . TOTAL HIP ARTHROPLASTY Left 04/07/2018   Procedure: LEFT TOTAL HIP ARTHROPLASTY ANTERIOR APPROACH;  Surgeon: [name], MD;  [facility];  Service: Orthopedics;  Laterality: Left;  Needs RNFA    There were no vitals filed for this visit.  Subjective Assessment - 06/08/18 1432    Subjective  "I have been having to walk around the hospital and have had to do alot of walking and noticed increased sorenesss and tightness, I wonder if I have a hematoma"     Currently in Pain?  No/denies                       [facility] Adult PT Treatment/Exercise - 06/08/18 1450      Knee/Hip Exercises: Stretches   Quad Stretch  3 reps;30 seconds    Hip Flexor Stretch  2 reps;30 seconds    Hip Flexor Stretch Limitations  bending the knee to bias the rectus femoirs      Knee/Hip Exercises: Aerobic   Nustep  L6 x 6 min LE only      Knee/Hip Exercises: Supine   Constance Haw  2 sets;10 reps;Both;Strengthening    Straight Leg Raises  1 set;Left;Strengthening;10 reps      Manual Therapy   Manual therapy comments  MTPR along the rectus femoris x 3     Soft tissue mobilization  DTM using roller along proximal anterior thigh               PT Short Term Goals - 05/15/18 1340      PT SHORT TERM GOAL #1   Title  pt to be I with inital HEP     Status  Achieved      PT SHORT TERM GOAL #2   Title  pt to verbalize / demo proper posture and gait mechanics to prevent and reduce L hip pain    Status  Achieved      PT SHORT TERM GOAL #3   Title  increase L hip flexion to >/= 60 degrees with </= 5/10 pain to promote functional progression     Status  On-going      PT SHORT TERM GOAL #4   Title  pt to be able to ambulate with [facility] for >/= 15 min reporting </= 4/10 pain for therapeutic progression     Status  Achieved         PT Long Term Goals - 05/15/18 1341      PT LONG TERM GOAL #1   Title  pt to increase hip flexion to >/= 80 degrees on the L and extension to >/= 10 degreess to promote functional and efficient gait    Status  On-going      PT LONG TERM GOAL #2   Title  pt to demonstate >/= 4+/5 in LLE to promote hip stability with standing/ walking to maximize safety    Status  On-going      PT LONG TERM GOAL #3   Title  pt to be able to stand/ walk >/= 1 hour with LRAD for endurance required for work related activities     Status  On-going      PT LONG TERM GOAL #4   Title  Increase FOTO score to </= 56% limited to demo improvement in function     Status  On-going      PT LONG TERM GOAL #5   Title  pt to be I with all HEP given as of last visit to maintain and progress current level of function independently    Status  On-going            Plan - 06/08/18 1456    Clinical Impression Statement  pt arrived 15 min late. she reports increased swelling since she had to do more walking due to her son being at [facility]. focused on STW and stretching of the rectus femoris which she reported relief. she demonstrated improvement in gait pattern end of session and noted no increase in pain.     PT Treatment/Interventions  ADLs/Self Care Home Management;Cryotherapy;Electrical Stimulation;Iontophoresis 4mg /ml Dexamethasone;Moist Heat;Balance training;Stair training;Functional mobility training;Therapeutic activities;Therapeutic exercise;Manual techniques;Taping;Passive range of motion;Patient/family education;Neuromuscular re-education    PT Next Visit Plan   gross LE strengthening, gait without AD, balance training, endurance training.     PT Home Exercise Plan  standing/ supine hip flexor stretch, heel slide with strap, supine marching, heel raises, standing glut sets, corner balance, warrior pose stretch    Consulted and Agree with Plan of Care  Patient       Patient will benefit from skilled  therapeutic intervention in order to  improve the following deficits and impairments:  Abnormal gait, Obesity, Pain, Decreased activity tolerance, Decreased endurance, Decreased balance, Improper body mechanics, Postural dysfunction, Decreased strength, Increased fascial restricitons  Visit Diagnosis: Pain in left hip  Stiffness of left hip, not elsewhere classified  Other abnormalities of gait and mobility  Muscle weakness (generalized)     Problem List Patient Active Problem List   Diagnosis Date Noted  . Status post total replacement of left hip 04/07/2018  . Unilateral primary osteoarthritis, left hip 02/23/2018  . 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 History of total hip replacement, right 11/17/2017  . Chronic anticoagulation 11/19/2016  . Tinnitus 11/19/2016  . Right knee pain 07/06/2016  . Left hip pain 10/21/2015  . Gastroesophageal reflux disease 09/19/2015  . Itchy eyes 03/12/2015  . Allergic conjunctivitis 03/12/2015  . OSA (obstructive sleep apnea) 02/20/2015  . Morbid obesity ([place]) 01/15/2015  . Lymphedema 06/19/2014  . Personal history of colonic polyps - large hyperplastic 12/25/2013  . Skin infection 10/14/2013  . Candidiasis of female genitalia 10/14/2013  . IFG (impaired fasting glucose) 10/14/2013  . Carpal tunnel syndrome 10/14/2013  . Cough 06/20/2013  . Allergic rhinitis, cause unspecified 06/20/2013  . Backache 06/05/2013  . Edema 12/20/2012  . Swelling of limb 11/30/2012  . Essential hypertension, benign 11/30/2012  . Headache(784.0) 11/30/2012  . Avascular necrosis of hip ([place]) 07/21/2012  . Atrial fibrillation ([place]) 07/06/2012  . Hypertension 09/14/2011  . Psoriasis 08/16/2005   Starr Lake PT, DPT, LAT, ATC  06/08/18  2:59 PM      [facility] [address] [place], [place], [address] Phone: [phone]   Fax:  [phone]  Name: TYRENA GOHR MRN: [id number] Date of  Birth: 07/20/1956

## 2018-06-09 ENCOUNTER — Ambulatory Visit: Payer: 59 | Admitting: Physical Therapy

## 2018-06-09 DIAGNOSIS — M6281 Muscle weakness (generalized): Secondary | ICD-10-CM

## 2018-06-09 DIAGNOSIS — R2689 Other abnormalities of gait and mobility: Secondary | ICD-10-CM | POA: Diagnosis not present

## 2018-06-09 DIAGNOSIS — M25652 Stiffness of left hip, not elsewhere classified: Secondary | ICD-10-CM

## 2018-06-09 DIAGNOSIS — M25552 Pain in left hip: Secondary | ICD-10-CM | POA: Diagnosis not present

## 2018-06-09 NOTE — Therapy (Signed)
[place], [place], [address] Phone: [phone]   Fax:  [phone]  Physical Therapy Treatment  Patient Details  Name: Monica [name] MRN: [id number] Date of Birth: [birth year] Referring Provider (PT): Dr Zollie Beckers   Encounter Date: 06/09/2018  PT End of Session - 06/09/18 1156    Visit Number  12    Number of Visits  17    Date for PT Re-Evaluation  06/29/18    Authorization Type  [facility]    PT Start Time  1105    PT Stop Time  1146    PT Time Calculation (min)  41 min    Activity Tolerance  Patient tolerated treatment well    Behavior During Therapy  [facility] for tasks assessed/performed       Past Medical History:  Diagnosis Date  . Atrial fibrillation ([place])    a. s/p DCCV in 12/2012 b. repeat DCCV in 09/2014 - started on Flecainide  . Avascular necrosis of bone of right hip ([place])   . Dysrhythmia    a fib  . Hypertension   . Morbid obesity ([place])   . Osteoarthritis   . Personal history of colonic polyps - large hyperplastic 12/25/2013  . Pre-diabetes   . Psoriasis    active breakout left buttocks  . Right hip pain   . Shortness of breath   . Sleep apnea    uses mouth guard only  . Tachycardia, unspecified     Past Surgical History:  Procedure Laterality Date  . CARDIOVERSION N/A 01/09/2013   Procedure: CARDIOVERSION;  Surgeon: [name], MD;  Location: [facility] ENDOSCOPY;  Service: Cardiovascular;  Laterality: N/A;  . CARDIOVERSION N/A 10/07/2014   Procedure: CARDIOVERSION;  Surgeon: [name], MD;  Location: [facility] ENDOSCOPY;  Service: Cardiovascular;  Laterality: N/A;  09:00 Lido 60mg ,IV followed by Propofol  70mg /IV    synched electrocardioversion at 120 joules for Afib,repeated at 200 joules, 70 mg...successfully changed to SR  . FRACTURE SURGERY  07/21/12   right hip arthroplasty  . JOINT REPLACEMENT     Left hip Dr. [name] 04-07-18  . TOTAL HIP ARTHROPLASTY  07/21/2012    Procedure: TOTAL HIP ARTHROPLASTY;  Surgeon: [name], MD;  [facility];  Service: Orthopedics;  Laterality: Right;  . TOTAL HIP ARTHROPLASTY Left 04/07/2018   Procedure: LEFT TOTAL HIP ARTHROPLASTY ANTERIOR APPROACH;  Surgeon: [name], MD;  [facility];  Service: Orthopedics;  Laterality: Left;  Needs RNFA    There were no vitals filed for this visit.  Subjective Assessment - 06/09/18 1110    Subjective  "I noticed big difference since yesterday, with the stretching. pt reports she begins working full time on Monday"     Patient Stated Goals  to go up/ down stairs, walk without and AD, and work without complications     Currently in Pain?  No/denies    Pain Score  0-No pain    Pain Orientation  Left    Pain Type  Chronic pain;Surgical pain    Pain Onset  More than a month ago    Pain Frequency  Intermittent    Aggravating Factors   prolonged walking    Pain Relieving Factors  medication                       [facility] Adult PT Treatment/Exercise - 06/09/18 0001      Self-Care   Other Self-Care  Comments   reviewed MTPR along the rectus femoris      Exercises   Exercises  Knee/Hip      Knee/Hip Exercises: Stretches   Hip Flexor Stretch  30 seconds;3 reps   PNF contract/ relax with 10 sec hold   Hip Flexor Stretch Limitations  bending the knee to bias the rectus femoris    Other Knee/Hip Stretches  warror pose bil 2 x 30     Other Knee/Hip Stretches  standing hip flexor stretch 4x 30 sec with isometric       Knee/Hip Exercises: Aerobic   Nustep  L7 x 6 min LE only   increasing endurance     Knee/Hip Exercises: Standing   Forward Step Up  2 sets;15 reps;Step Height: 8"    Other Standing Knee Exercises  marching 2 x 20 standing on airex pad gradually removing UE assist       Manual Therapy   Manual therapy comments  MTPR along the rectus femoris x 3     Soft tissue mobilization  DTM using roller along proximal anterior thigh              PT Education - 06/09/18 1158    Education Details  when at work take frequent standing breaks if she has been sitting for a long period of time, doesn't have to be a long break but it is to prevent muscle spasm.     Person(s) Educated  Patient    Methods  Explanation;Verbal cues    Comprehension  Verbalized understanding;Verbal cues required       PT Short Term Goals - 05/15/18 1340      PT SHORT TERM GOAL #1   Title  pt to be I with inital HEP     Status  Achieved      PT SHORT TERM GOAL #2   Title  pt to verbalize / demo proper posture and gait mechanics to prevent and reduce L hip pain    Status  Achieved      PT SHORT TERM GOAL #3   Title  increase L hip flexion to >/= 60 degrees with </= 5/10 pain to promote functional progression     Status  On-going      PT SHORT TERM GOAL #4   Title  pt to be able to ambulate with [facility] for >/= 15 min reporting </= 4/10 pain for therapeutic progression     Status  Achieved        PT Long Term Goals - 05/15/18 1341      PT LONG TERM GOAL #1   Title  pt to increase hip flexion to >/= 80 degrees on the L and extension to >/= 10 degreess to promote functional and efficient gait    Status  On-going      PT LONG TERM GOAL #2   Title  pt to demonstate >/= 4+/5 in LLE to promote hip stability with standing/ walking to maximize safety    Status  On-going      PT LONG TERM GOAL #3   Title  pt to be able to stand/ walk >/= 1 hour with LRAD for endurance required for work related activities     Status  On-going      PT LONG TERM GOAL #4   Title  Increase FOTO score to </= 56% limited to demo improvement in function     Status  On-going      PT LONG TERM GOAL #  5   Title  pt to be I with all HEP given as of last visit to maintain and progress current level of function independently    Status  On-going            Plan - 06/09/18 1156    Clinical Impression Statement  pt noted improvement of pain in the L hip since  last visit and reported decreased swelling and no pain. Continued working STW aong the rectus femoris and stretching. She was able to perform all exercises today with no report of pain. she begins work next week, plan to assess how she is doing with work and asess need for more PT.     PT Next Visit Plan   gross LE strengthening, gait without AD, balance training, endurance training. how was work?    PT Home Exercise Plan  standing/ supine hip flexor stretch, heel slide with strap, supine marching, heel raises, standing glut sets, corner balance, warrior pose stretch    Consulted and Agree with Plan of Care  Patient       Patient will benefit from skilled therapeutic intervention in order to improve the following deficits and impairments:  Abnormal gait, Obesity, Pain, Decreased activity tolerance, Decreased endurance, Decreased balance, Improper body mechanics, Postural dysfunction, Decreased strength, Increased fascial restricitons  Visit Diagnosis: Pain in left hip  Stiffness of left hip, not elsewhere classified  Other abnormalities of gait and mobility  Muscle weakness (generalized)     Problem List Patient Active Problem List   Diagnosis Date Noted  . Status post total replacement of left hip 04/07/2018  . Unilateral primary osteoarthritis, left hip 02/23/2018  . 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 History of total hip replacement, right 11/17/2017  . Chronic anticoagulation 11/19/2016  . Tinnitus 11/19/2016  . Right knee pain 07/06/2016  . Left hip pain 10/21/2015  . Gastroesophageal reflux disease 09/19/2015  . Itchy eyes 03/12/2015  . Allergic conjunctivitis 03/12/2015  . OSA (obstructive sleep apnea) 02/20/2015  . Morbid obesity ([place]) 01/15/2015  . Lymphedema 06/19/2014  . Personal history of colonic polyps - large hyperplastic 12/25/2013  . Skin infection 10/14/2013  . Candidiasis of female genitalia 10/14/2013  . IFG (impaired fasting glucose) 10/14/2013  .  Carpal tunnel syndrome 10/14/2013  . Cough 06/20/2013  . Allergic rhinitis, cause unspecified 06/20/2013  . Backache 06/05/2013  . Edema 12/20/2012  . Swelling of limb 11/30/2012  . Essential hypertension, benign 11/30/2012  . Headache(784.0) 11/30/2012  . Avascular necrosis of hip ([place]) 07/21/2012  . Atrial fibrillation ([place]) 07/06/2012  . Hypertension 09/14/2011  . Psoriasis 08/16/2005   Starr Lake PT, DPT, LAT, ATC  06/09/18  12:01 PM      [facility] [address] [place], [place], [address] Phone: [phone]   Fax:  [phone]  Name: Monica [name] MRN: [id number] Date of Birth: September 11, 1955

## 2018-06-15 ENCOUNTER — Ambulatory Visit: Payer: 59 | Admitting: Physical Therapy

## 2018-06-15 ENCOUNTER — Encounter: Payer: Self-pay | Admitting: Physical Therapy

## 2018-06-15 DIAGNOSIS — M6281 Muscle weakness (generalized): Secondary | ICD-10-CM | POA: Diagnosis not present

## 2018-06-15 DIAGNOSIS — R2689 Other abnormalities of gait and mobility: Secondary | ICD-10-CM | POA: Diagnosis not present

## 2018-06-15 DIAGNOSIS — M25652 Stiffness of left hip, not elsewhere classified: Secondary | ICD-10-CM

## 2018-06-15 DIAGNOSIS — M25552 Pain in left hip: Secondary | ICD-10-CM | POA: Diagnosis not present

## 2018-06-15 NOTE — Therapy (Signed)
[place], [place], [address] Phone: [phone]   Fax:  [phone]  Physical Therapy Treatment  Patient Details  Name: Monica [name] MRN: [id number] Date of Birth: [birth year] Referring Provider (PT): Dr Zollie Beckers   Encounter Date: 06/15/2018  PT End of Session - 06/15/18 1506    Visit Number  13    Number of Visits  17    Date for PT Re-Evaluation  06/29/18    Authorization Type  [facility]    PT Start Time  1501    PT Stop Time  1539    PT Time Calculation (min)  38 min    Activity Tolerance  Patient tolerated treatment well    Behavior During Therapy  [facility] for tasks assessed/performed       Past Medical History:  Diagnosis Date  . Atrial fibrillation ([place])    a. s/p DCCV in 12/2012 b. repeat DCCV in 09/2014 - started on Flecainide  . Avascular necrosis of bone of right hip ([place])   . Dysrhythmia    a fib  . Hypertension   . Morbid obesity ([place])   . Osteoarthritis   . Personal history of colonic polyps - large hyperplastic 12/25/2013  . Pre-diabetes   . Psoriasis    active breakout left buttocks  . Right hip pain   . Shortness of breath   . Sleep apnea    uses mouth guard only  . Tachycardia, unspecified     Past Surgical History:  Procedure Laterality Date  . CARDIOVERSION N/A 01/09/2013   Procedure: CARDIOVERSION;  Surgeon: [name], MD;  Location: [facility] ENDOSCOPY;  Service: Cardiovascular;  Laterality: N/A;  . CARDIOVERSION N/A 10/07/2014   Procedure: CARDIOVERSION;  Surgeon: [name], MD;  Location: [facility] ENDOSCOPY;  Service: Cardiovascular;  Laterality: N/A;  09:00 Lido 60mg ,IV followed by Propofol  70mg /IV    synched electrocardioversion at 120 joules for Afib,repeated at 200 joules, 70 mg...successfully changed to SR  . FRACTURE SURGERY  07/21/12   right hip arthroplasty  . JOINT REPLACEMENT     Left hip Dr. [name] 04-07-18  . TOTAL HIP ARTHROPLASTY  07/21/2012   Procedure: TOTAL HIP ARTHROPLASTY;  Surgeon: [name], MD;  Location: [facility] ORS;  Service: Orthopedics;  Laterality: Right;  . TOTAL HIP ARTHROPLASTY Left 04/07/2018   Procedure: LEFT TOTAL HIP ARTHROPLASTY ANTERIOR APPROACH;  Surgeon: [name], MD;  Location: [facility] ORS;  Service: Orthopedics;  Laterality: Left;  Needs RNFA    There were no vitals filed for this visit.  Subjective Assessment - 06/15/18 1506    Subjective  "I did make it through work, I am a little sore and little wobble but overall I am doing well"     Currently in Pain?  No/denies                       [facility] Adult PT Treatment/Exercise - 06/15/18 0001      Exercises   Exercises  Knee/Hip      Knee/Hip Exercises: Stretches   Hip Flexor Stretch  2 reps;30 seconds    Other Knee/Hip Stretches  warror pose bil 2 x 30       Knee/Hip Exercises: Aerobic   Nustep  L7 x 6 min LE only      Knee/Hip Exercises: Standing   Gait Training  pt initially demo increased postural sway but able to correct gait with cues 2 x 50  ft.      Knee/Hip Exercises: Seated   Long Arc Quad  2 sets;20 reps;Both;Strengthening   with green theraband   Marching  Both;2 sets;20 reps   with green theraband around knees.   Hamstring Curl  2 sets;20 reps   with green theraband   Sit to Sand  2 sets;15 reps;without UE support   with green theraband around the knees            PT Education - 06/15/18 1539    Education Details  updated HEP for quad and hip strengthening. discussed POC cancelling next visit and seeing pt back in a week to assess progress and see if she needs more PT.     Person(s) Educated  Patient    Methods  Explanation    Comprehension  Verbalized understanding       PT Short Term Goals - 05/15/18 1340      PT SHORT TERM GOAL #1   Title  pt to be I with inital HEP     Status  Achieved      PT SHORT TERM GOAL #2   Title  pt to verbalize / demo proper posture and gait mechanics to  prevent and reduce L hip pain    Status  Achieved      PT SHORT TERM GOAL #3   Title  increase L hip flexion to >/= 60 degrees with </= 5/10 pain to promote functional progression     Status  On-going      PT SHORT TERM GOAL #4   Title  pt to be able to ambulate with [facility] for >/= 15 min reporting </= 4/10 pain for therapeutic progression     Status  Achieved        PT Long Term Goals - 05/15/18 1341      PT LONG TERM GOAL #1   Title  pt to increase hip flexion to >/= 80 degrees on the L and extension to >/= 10 degreess to promote functional and efficient gait    Status  On-going      PT LONG TERM GOAL #2   Title  pt to demonstate >/= 4+/5 in LLE to promote hip stability with standing/ walking to maximize safety    Status  On-going      PT LONG TERM GOAL #3   Title  pt to be able to stand/ walk >/= 1 hour with LRAD for endurance required for work related activities     Status  On-going      PT LONG TERM GOAL #4   Title  Increase FOTO score to </= 56% limited to demo improvement in function     Status  On-going      PT LONG TERM GOAL #5   Title  pt to be I with all HEP given as of last visit to maintain and progress current level of function independently    Status  On-going            Plan - 06/15/18 1539    Clinical Impression Statement  pt continues to make great progress with physical therapy. she rpeorted no pain today and was able to complete 36 hours of work stating she was comliant with stretching and avoiding quick movements. She performed strengthening and stretching today reporting no pain. plan to see pt back in 1 week to assess how she is doing without PT and see if she needs more PT.     PT Treatment/Interventions  ADLs/Self Care Home Management;Cryotherapy;Electrical Stimulation;Iontophoresis 4mg /ml Dexamethasone;Moist Heat;Balance training;Stair training;Functional mobility training;Therapeutic activities;Therapeutic exercise;Manual techniques;Taping;Passive  range of motion;Patient/family education;Neuromuscular re-education    PT Next Visit Plan   gross LE strengthening, gait without AD, balance training, endurance training. how was work?    PT Home Exercise Plan  standing/ supine hip flexor stretch, heel slide with strap, supine marching, heel raises, standing glut sets, corner balance, warrior pose stretch    Consulted and Agree with Plan of Care  Patient       Patient will benefit from skilled therapeutic intervention in order to improve the following deficits and impairments:  Abnormal gait, Obesity, Pain, Decreased activity tolerance, Decreased endurance, Decreased balance, Improper body mechanics, Postural dysfunction, Decreased strength, Increased fascial restricitons  Visit Diagnosis: Pain in left hip  Stiffness of left hip, not elsewhere classified  Other abnormalities of gait and mobility  Muscle weakness (generalized)     Problem List Patient Active Problem List   Diagnosis Date Noted  . Status post total replacement of left hip 04/07/2018  . Unilateral primary osteoarthritis, left hip 02/23/2018  . 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 History of total hip replacement, right 11/17/2017  . Chronic anticoagulation 11/19/2016  . Tinnitus 11/19/2016  . Right knee pain 07/06/2016  . Left hip pain 10/21/2015  . Gastroesophageal reflux disease 09/19/2015  . Itchy eyes 03/12/2015  . Allergic conjunctivitis 03/12/2015  . OSA (obstructive sleep apnea) 02/20/2015  . Morbid obesity ([place]) 01/15/2015  . Lymphedema 06/19/2014  . Personal history of colonic polyps - large hyperplastic 12/25/2013  . Skin infection 10/14/2013  . Candidiasis of female genitalia 10/14/2013  . IFG (impaired fasting glucose) 10/14/2013  . Carpal tunnel syndrome 10/14/2013  . Cough 06/20/2013  . Allergic rhinitis, cause unspecified 06/20/2013  . Backache 06/05/2013  . Edema 12/20/2012  . Swelling of limb 11/30/2012  . Essential hypertension,  benign 11/30/2012  . Headache(784.0) 11/30/2012  . Avascular necrosis of hip ([place]) 07/21/2012  . Atrial fibrillation ([place]) 07/06/2012  . Hypertension 09/14/2011  . Psoriasis 08/16/2005   Starr Lake PT, DPT, LAT, ATC  06/15/18  3:42 PM      [facility] [address] [place], [place], [address] Phone: [phone]   Fax:  [phone]  Name: Monica [name] MRN: [id number] Date of Birth: 1956-06-04

## 2018-06-16 ENCOUNTER — Ambulatory Visit: Payer: 59 | Admitting: Physical Therapy

## 2018-06-22 ENCOUNTER — Encounter (INDEPENDENT_AMBULATORY_CARE_PROVIDER_SITE_OTHER): Payer: Self-pay | Admitting: Orthopaedic Surgery

## 2018-06-22 ENCOUNTER — Ambulatory Visit (INDEPENDENT_AMBULATORY_CARE_PROVIDER_SITE_OTHER): Payer: 59 | Admitting: Orthopaedic Surgery

## 2018-06-22 ENCOUNTER — Ambulatory Visit: Payer: 59 | Attending: Orthopaedic Surgery | Admitting: Physical Therapy

## 2018-06-22 DIAGNOSIS — R2689 Other abnormalities of gait and mobility: Secondary | ICD-10-CM | POA: Insufficient documentation

## 2018-06-22 DIAGNOSIS — Z96642 Presence of left artificial hip joint: Secondary | ICD-10-CM

## 2018-06-22 DIAGNOSIS — M25652 Stiffness of left hip, not elsewhere classified: Secondary | ICD-10-CM | POA: Diagnosis not present

## 2018-06-22 DIAGNOSIS — M25552 Pain in left hip: Secondary | ICD-10-CM | POA: Diagnosis not present

## 2018-06-22 DIAGNOSIS — M6281 Muscle weakness (generalized): Secondary | ICD-10-CM | POA: Insufficient documentation

## 2018-06-22 NOTE — Progress Notes (Signed)
HPI: Ms. Markiewicz returns today follow-up of her left hip status post left total hip arthroplasty now 76 days postop.  She states overall she is doing well she is been back at work by steroids.  States she feels her overall strength and range of motion are improving.  She is had no chest pain fevers chills nausea or vomiting.  Physical exam: Left hip good range of motion without pain calf supple nontender.  She ambulates without any assistive device.  Impression: Status post left total hip arthroplasty  Plan: We will see her back at 1 year postop sooner if there is any questions or concerns.  At that time we will obtain an AP pelvis and lateral view of her left hip.

## 2018-06-22 NOTE — Therapy (Signed)
[place], [place], [address] Phone: [phone]   Fax:  [phone]  Physical Therapy Treatment / Discharge Summary  Patient Details  Name: XIADANI DAMMAN MRN: [id number] Date of Birth: [birth year] Referring Provider (PT): Dr Zollie Beckers   Encounter Date: 06/22/2018  PT End of Session - 06/22/18 1517    Visit Number  14    Number of Visits  17    Date for PT Re-Evaluation  06/29/18    Authorization Type  [facility] UMR    PT Start Time  2633   pt arrived 11 min   PT Stop Time  1543    PT Time Calculation (min)  32 min    Activity Tolerance  Patient tolerated treatment well    Behavior During Therapy  [facility] for tasks assessed/performed       Past Medical History:  Diagnosis Date  . Atrial fibrillation ([place])    a. s/p DCCV in 12/2012 b. repeat DCCV in 09/2014 - started on Flecainide  . Avascular necrosis of bone of right hip ([place])   . Dysrhythmia    a fib  . Hypertension   . Morbid obesity ([place])   . Osteoarthritis   . Personal history of colonic polyps - large hyperplastic 12/25/2013  . Pre-diabetes   . Psoriasis    active breakout left buttocks  . Right hip pain   . Shortness of breath   . Sleep apnea    uses mouth guard only  . Tachycardia, unspecified     Past Surgical History:  Procedure Laterality Date  . CARDIOVERSION N/A 01/09/2013   Procedure: CARDIOVERSION;  Surgeon: [name], MD;  [facility] END[facility];  Service: Cardiovascular;  Laterality: N/A;  . CARDIOVERSION N/A 10/07/2014   Procedure: CARDIOVERSION;  Surgeon: [name], MD;  Location: [facility] ENDOSCOPY;  Service: Cardiovascular;  Laterality: N/A;  09:00 Lido 70m,IV followed by Propofol  723mIV    synched electrocardioversion at 120 joules for Afib,repeated at 200 joules, 70 mg...successfully changed to SR  . FRACTURE SURGERY  07/21/12   right hip arthroplasty  . JOINT REPLACEMENT     Left hip Dr. BlNinfa Linden-23-19  .  TOTAL HIP ARTHROPLASTY  07/21/2012   Procedure: TOTAL HIP ARTHROPLASTY;  Surgeon: FrGearlean AlfMD;  [facility];  Service: Orthopedics;  Laterality: Right;  . TOTAL HIP ARTHROPLASTY Left 04/07/2018   Procedure: LEFT TOTAL HIP ARTHROPLASTY ANTERIOR APPROACH;  Surgeon: BlMcarthur RossettiMD;  Location[facility];  Service: Orthopedics;  Laterality: Left;  Needs RNFA    There were no vitals filed for this visit.  Subjective Assessment - 06/22/18 1514    Subjective  "I am doing okay today, and I have been working feeling only alittle sore. I have been doing my exercises.     Patient Stated Goals  to go up/ down stairs, walk without and AD, and work without complications     Currently in Pain?  No/denies    Pain Score  0-No pain    Pain Orientation  Left    Pain Type  Chronic pain    Pain Onset  More than a month ago    Pain Frequency  Intermittent    Aggravating Factors   working/ walking/ standing for long periods    Pain Relieving Factors   standing / walking,          [facility] PT Assessment - 06/22/18 0001      Assessment  Medical Diagnosis  L THA    Referring Provider (PT)  Dr Zollie Beckers      Observation/Other Assessments   Focus on Therapeutic Outcomes (FOTO)   31% limited      AROM   Left Hip Flexion  90      Strength   Left Hip Flexion  4/5    Left Hip ABduction  4-/5                   [facility] Adult PT Treatment/Exercise - 06/22/18 0001      Therapeutic Activites    Therapeutic Activities  Other Therapeutic Activities    Other Therapeutic Activities  stepping over elevated step to mimic getting into and out of her garden tub multiple times with good form and to maximize safety.      Knee/Hip Exercises: Stretches   Hip Flexor Stretch  2 reps;30 seconds    Other Knee/Hip Stretches  warror pose bil 2 x 30       Knee/Hip Exercises: Aerobic   Nustep  L7 x 6 min LE only             PT Education - 06/22/18 1722    Education Details  how  to get into and out of her tub safely. reviewed previously provided HEP and how to progress to promote endurance.    Person(s) Educated  Patient    Methods  Explanation;Verbal cues;Demonstration    Comprehension  Verbalized understanding;Verbal cues required;Returned demonstration       PT Short Term Goals - 06/22/18 1531      PT SHORT TERM GOAL #1   Title  pt to be I with inital HEP     Time  4    Period  Weeks    Status  Achieved      PT SHORT TERM GOAL #2   Title  pt to verbalize / demo proper posture and gait mechanics to prevent and reduce L hip pain    Time  4    Period  Weeks    Status  Achieved      PT SHORT TERM GOAL #3   Title  increase L hip flexion to >/= 60 degrees with </= 5/10 pain to promote functional progression     Time  4    Period  Weeks    Status  Achieved      PT SHORT TERM GOAL #4   Title  pt to be able to ambulate with [facility] for >/= 15 min reporting </= 4/10 pain for therapeutic progression     Time  4    Period  Weeks    Status  Achieved        PT Long Term Goals - 06/22/18 1532      PT LONG TERM GOAL #1   Title  pt to increase hip flexion to >/= 80 degrees on the L and extension to >/= 10 degreess to promote functional and efficient gait    Time  8    Period  Weeks    Status  Achieved      PT LONG TERM GOAL #2   Title  pt to demonstate >/= 4+/5 in LLE to promote hip stability with standing/ walking to maximize safety    Baseline  hip flexion 4/5, abduction 4-/5     Time  8    Period  Weeks    Status  Partially Met      PT LONG TERM GOAL #3  Title  pt to be able to stand/ walk >/= 1 hour with LRAD for endurance required for work related activities     Status  Achieved      PT LONG TERM GOAL #4   Title  Increase FOTO score to </= 56% limited to demo improvement in function     Time  8    Period  Weeks    Status  Achieved      PT LONG TERM GOAL #5   Title  pt to be I with all HEP given as of last visit to maintain and progress  current level of function independently    Time  8    Period  Weeks    Status  Achieved            Plan - 06/22/18 1717    Clinical Impression Statement  Mrs Karch has made great progress with physical therapy increasing both hip mobility and strength and additionally reports no pain today but notes intermittent soreness and swelling. she has returned to working full time and is consistent with her HEP and stretching. Practiced stepping over elevated height to mimic getting into and out of her garden tub at home, which she could do with cues for proper form. she met or partially met all goals and is able to maintain and progress her current level of function independently and will be discharged from PT today.     PT Treatment/Interventions  ADLs/Self Care Home Management;Cryotherapy;Electrical Stimulation;Iontophoresis 28m/ml Dexamethasone;Moist Heat;Balance training;Stair training;Functional mobility training;Therapeutic activities;Therapeutic exercise;Manual techniques;Taping;Passive range of motion;Patient/family education;Neuromuscular re-education    PT Next Visit Plan  D/C    PT Home Exercise Plan  standing/ supine hip flexor stretch, heel slide with strap, supine marching, heel raises, standing glut sets, corner balance, warrior pose stretch    Consulted and Agree with Plan of Care  Patient       Patient will benefit from skilled therapeutic intervention in order to improve the following deficits and impairments:  Abnormal gait, Obesity, Pain, Decreased activity tolerance, Decreased endurance, Decreased balance, Improper body mechanics, Postural dysfunction, Decreased strength, Increased fascial restricitons  Visit Diagnosis: Pain in left hip  Stiffness of left hip, not elsewhere classified  Other abnormalities of gait and mobility  Muscle weakness (generalized)     Problem List Patient Active Problem List   Diagnosis Date Noted  . Status post total replacement of left  hip 04/07/2018  . Unilateral primary osteoarthritis, left hip 02/23/2018  . 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 History of total hip replacement, right 11/17/2017  . Chronic anticoagulation 11/19/2016  . Tinnitus 11/19/2016  . Right knee pain 07/06/2016  . Left hip pain 10/21/2015  . Gastroesophageal reflux disease 09/19/2015  . Itchy eyes 03/12/2015  . Allergic conjunctivitis 03/12/2015  . OSA (obstructive sleep apnea) 02/20/2015  . Morbid obesity ([place] 01/15/2015  . Lymphedema 06/19/2014  . Personal history of colonic polyps - large hyperplastic 12/25/2013  . Skin infection 10/14/2013  . Candidiasis of female genitalia 10/14/2013  . IFG (impaired fasting glucose) 10/14/2013  . Carpal tunnel syndrome 10/14/2013  . Cough 06/20/2013  . Allergic rhinitis, cause unspecified 06/20/2013  . Backache 06/05/2013  . Edema 12/20/2012  . Swelling of limb 11/30/2012  . Essential hypertension, benign 11/30/2012  . Headache(784.0) 11/30/2012  . Avascular necrosis of hip ([place] 07/21/2012  . Atrial fibrillation ([place] 07/06/2012  . Hypertension 09/14/2011  . Psoriasis 08/16/2005    LStarr Lake11/02/2018, 5:23 PM  [facility]  [place], [place], [address] Phone: [phone]   Fax:  [phone]  Name: LADONNA VANORDER MRN: [id number] Date of Birth: 1955-08-24        PHYSICAL THERAPY DISCHARGE SUMMARY  Visits from Start of Care: 14  Current functional level related to goals / functional outcomes: See goals FOTO 31% limited   Remaining deficits: Intermittent soreness and swelling with prolonged standing and walking. See above assessment    Education / Equipment: HEP, theraband, posture education   Plan: Patient agrees to discharge.  Patient goals were partially met. Patient is being discharged due to being pleased with the current functional level.  ?????        Maelani Yarbro PT, DPT, LAT, ATC   06/22/18  5:25 PM

## 2018-06-23 ENCOUNTER — Ambulatory Visit: Payer: 59 | Admitting: Physical Therapy

## 2018-06-27 MED FILL — BYSTOLIC 10 MG TABLET: 10 | 30 days supply | Qty: 30 | Fill #0

## 2018-06-27 MED FILL — COSENTYX 300 MG DOSE-2 PENS: 150 | 28 days supply | Qty: 2 | Fill #2

## 2018-06-27 MED FILL — HYDROCHLOROTHIAZIDE 25 MG T: 25 | 30 days supply | Qty: 30 | Fill #0

## 2018-06-27 MED FILL — TRULICITY 1.5 MG/0.5 ML PEN: 1.5 | 28 days supply | Qty: 2 | Fill #4

## 2018-07-12 ENCOUNTER — Other Ambulatory Visit (HOSPITAL_BASED_OUTPATIENT_CLINIC_OR_DEPARTMENT_OTHER): Payer: Self-pay | Admitting: Family Medicine

## 2018-07-12 ENCOUNTER — Other Ambulatory Visit: Payer: Self-pay | Admitting: Rheumatology

## 2018-07-12 DIAGNOSIS — Z1231 Encounter for screening mammogram for malignant neoplasm of breast: Secondary | ICD-10-CM

## 2018-07-12 MED FILL — FLECAINIDE ACETATE 50 MG TA: 50 | 90 days supply | Qty: 180 | Fill #1

## 2018-08-01 ENCOUNTER — Telehealth: Payer: 59 | Admitting: Family

## 2018-08-01 DIAGNOSIS — K591 Functional diarrhea: Secondary | ICD-10-CM

## 2018-08-01 MED FILL — COSENTYX 300 MG DOSE-2 PENS: 150 | 28 days supply | Qty: 2 | Fill #3

## 2018-08-01 NOTE — Progress Notes (Signed)
be evaluated in a face to face office visit.  NOTE: If you entered your credit card information for this eVisit, you will not be charged. You may see a "hold" on your card for the $30 but that hold will drop off and you will not have a charge processed.  If you are having a true medical emergency please call 911.  If you need anBased on what you shared with me it looks like you have a condition that should urgent face to face visit, [facility] has four urgent care centers for your convenience.  If you need care fast and have a high deductible or no insurance consider:   [email or web address] to reserve your spot online an avoid wait times  [facility] [address], [address] [place], [place] [address] 8 am to 8 pm Monday-Friday 10 am to 4 pm Saturday-Sunday *Across the street from [employer]  [place], [address] 8 am to 5 pm Monday-Friday * In the [facility] on the [facility]   The following sites will take your  insurance:  . [facility] Urgent [place] a Provider at this Location  [address] [place], [place] [address] . 10 am to 8 pm Monday-Friday . 12 pm to 8 pm Saturday-Sunday   . [facility] Urgent Care at [place] a Provider at this Location  [place], [place], [place] [address] . 8 am to 8 pm Monday-Friday . 9 am to 6 pm Saturday . 11 am to 6 pm Sunday   . [facility] Urgent Care at [place] Get Driving Directions  [address] [place], [place] [address] . 8 am to 8 pm Monday-Friday . 8 am to 4 pm Saturday-Sunday   Your e-visit answers were reviewed by a board certified advanced clinical practitioner to complete your personal care plan.  Thank you for using e-Visits.

## 2018-08-04 DIAGNOSIS — I10 Essential (primary) hypertension: Secondary | ICD-10-CM | POA: Diagnosis not present

## 2018-08-04 DIAGNOSIS — R197 Diarrhea, unspecified: Secondary | ICD-10-CM | POA: Diagnosis not present

## 2018-08-04 DIAGNOSIS — E119 Type 2 diabetes mellitus without complications: Secondary | ICD-10-CM | POA: Diagnosis not present

## 2018-08-04 DIAGNOSIS — H9313 Tinnitus, bilateral: Secondary | ICD-10-CM | POA: Diagnosis not present

## 2018-08-04 MED FILL — HYDROCHLOROTHIAZIDE 25 MG T: 25 | 90 days supply | Qty: 90 | Fill #0

## 2018-08-04 MED FILL — BYSTOLIC 10 MG TABLET: 10 | 90 days supply | Qty: 90 | Fill #0

## 2018-08-04 MED FILL — SYNJARDY XR 25-1000 MG TB24: 25-1000 | 90 days supply | Qty: 90 | Fill #0

## 2018-08-04 MED FILL — SERTRALINE HCL 50 MG TABLET: 50 | 90 days supply | Qty: 90 | Fill #0

## 2018-08-04 MED FILL — TRULICITY 1.5 MG/0.5 ML PEN: 1.5 | 28 days supply | Qty: 2 | Fill #0

## 2018-08-08 ENCOUNTER — Inpatient Hospital Stay (HOSPITAL_BASED_OUTPATIENT_CLINIC_OR_DEPARTMENT_OTHER): Admission: RE | Admit: 2018-08-08 | Payer: 59 | Source: Ambulatory Visit

## 2018-08-11 ENCOUNTER — Ambulatory Visit (HOSPITAL_BASED_OUTPATIENT_CLINIC_OR_DEPARTMENT_OTHER)
Admission: RE | Admit: 2018-08-11 | Discharge: 2018-08-11 | Disposition: A | Discharge status: Home / Self Care | Payer: 59 | Source: Ambulatory Visit | Attending: Family Medicine | Admitting: Family Medicine

## 2018-08-11 DIAGNOSIS — Z1231 Encounter for screening mammogram for malignant neoplasm of breast: Secondary | ICD-10-CM | POA: Diagnosis not present

## 2018-09-05 MED FILL — XARELTO 20 MG TABLET: 20 | 90 days supply | Qty: 90 | Fill #2

## 2018-09-13 MED FILL — TRULICITY 1.5 MG/0.5 ML PEN: 1.5 | 28 days supply | Qty: 2 | Fill #1

## 2018-10-03 ENCOUNTER — Other Ambulatory Visit: Payer: Self-pay | Admitting: Pharmacist

## 2018-10-03 MED ORDER — SECUKINUMAB 150 MG/ML ~~LOC~~ SOAJ: 2.0000 mL | SUBCUTANEOUS | 1 refills | Status: DC

## 2018-10-03 MED ORDER — SECUKINUMAB (300 MG DOSE) 150 MG/ML ~~LOC~~ SOSY: 2.0000 mL | PREFILLED_SYRINGE | SUBCUTANEOUS | 1 refills | Status: DC

## 2018-10-03 MED FILL — COSENTYX 300 MG DOSE-2 PENS: 150 | 28 days supply | Qty: 2 | Fill #0

## 2018-10-11 MED FILL — FLECAINIDE ACETATE 50 MG TA: 50 | 90 days supply | Qty: 180 | Fill #2

## 2018-10-13 MED FILL — TRULICITY 1.5 MG/0.5 ML PEN: 1.5 | 28 days supply | Qty: 2 | Fill #2

## 2018-10-24 MED FILL — BYSTOLIC 10 MG TABLET: 10 | 90 days supply | Qty: 90 | Fill #1

## 2018-10-31 MED FILL — COSENTYX 300 MG DOSE-2 PENS: 150 | 28 days supply | Qty: 2 | Fill #1

## 2018-10-31 MED FILL — SERTRALINE HCL 50 MG TABLET: 50 | 90 days supply | Qty: 90 | Fill #1

## 2018-10-31 MED FILL — HYDROCHLOROTHIAZIDE 25 MG T: 25 | 90 days supply | Qty: 90 | Fill #1

## 2018-11-02 DIAGNOSIS — Z79899 Other long term (current) drug therapy: Secondary | ICD-10-CM | POA: Diagnosis not present

## 2018-11-02 DIAGNOSIS — L400 Psoriasis vulgaris: Secondary | ICD-10-CM | POA: Diagnosis not present

## 2018-11-02 MED FILL — CLOBETASOL 0.05% SOLUTION: 0.05 | 30 days supply | Qty: 50 | Fill #0

## 2018-11-02 MED FILL — CLOBETASOL PROP 0.05% OINT: 0.05 | 30 days supply | Qty: 60 | Fill #0

## 2018-11-02 MED FILL — DESONIDE 0.05% CREAM: 0.05 | 15 days supply | Qty: 30 | Fill #0

## 2018-11-02 MED FILL — CLOBETASOL PROPIONATE 0.05: 0.05 | 30 days supply | Qty: 60 | Fill #0

## 2018-11-09 MED FILL — METHOTREXATE SODIUM 2.5 MG: 2.5 | 28 days supply | Qty: 24 | Fill #0

## 2018-11-09 MED FILL — FOLIC ACID 1 MG TABS: 1 | 90 days supply | Qty: 90 | Fill #0

## 2018-11-10 MED FILL — TRULICITY 1.5 MG/0.5 ML PEN: 1.5 | 28 days supply | Qty: 2 | Fill #3

## 2018-11-28 ENCOUNTER — Telehealth: Payer: 59 | Admitting: Nurse Practitioner

## 2018-11-28 DIAGNOSIS — L03116 Cellulitis of left lower limb: Secondary | ICD-10-CM | POA: Diagnosis not present

## 2018-11-28 MED ORDER — CLINDAMYCIN HCL 150 MG PO CAPS: 450.0000 mg | ORAL_CAPSULE | Freq: Three times a day (TID) | ORAL | 0 refills | Status: DC

## 2018-11-28 MED ORDER — FLUCONAZOLE 150 MG PO TABS: 150.0000 mg | ORAL_TABLET | Freq: Once | ORAL | 0 refills | Status: AC

## 2018-11-28 MED FILL — CLINDAMYCIN HCL 150 MG CAPS: 150 | 5 days supply | Qty: 45 | Fill #0

## 2018-11-28 MED FILL — FLUCONAZOLE 150 MG TABS: 150 | 1 days supply | Qty: 1 | Fill #0

## 2018-11-28 NOTE — Addendum Note (Signed)
Addended by: Chevis Pretty on: 11/28/2018 05:14 PM   Modules accepted: Orders

## 2018-11-28 NOTE — Progress Notes (Signed)
E Visit for Cellulitis  We are sorry that you are not feeling well. Here is how we plan to help!  Based on what you shared with me it looks like you have cellulitis.  Cellulitis looks like areas of skin redness, swelling, and warmth; it develops as a result of bacteria entering under the skin. Little red spots and/or bleeding can be seen in skin, and tiny surface sacs containing fluid can occur. Fever can be present. Cellulitis is almost always on one side of a body, and the lower limbs are the most common site of involvement.   I have prescribed:  Clindamycin, 450mg  orally three times a day for 5 days  If this is not improving within 24-36 hours then need to see PCP - may need IV antibiotic  HOME CARE:  . Take your medications as ordered and take all of them, even if the skin irritation appears to be healing.   GET HELP RIGHT AWAY IF:  . Symptoms that don't begin to go away within 48 hours. . Severe redness persists or worsens . If the area turns color, spreads or swells. . If it blisters and opens, develops yellow-brown crust or bleeds. . You develop a fever or chills. . If the pain increases or becomes unbearable.  . Are unable to keep fluids and food down.  MAKE SURE YOU    Understand these instructions.  Will watch your condition.  Will get help right away if you are not doing well or get worse.  Thank you for choosing an e-visit. Your e-visit answers were reviewed by a board certified advanced clinical practitioner to complete your personal care plan. Depending upon the condition, your plan could have included both over the counter or prescription medications. Please review your pharmacy choice. Make sure the pharmacy is open so you can pick up prescription now. If there is a problem, you may contact your provider through [employer] and have the prescription routed to another pharmacy. Your safety is important to [place]. If you have drug allergies check your prescription  carefully.  For the next 24 hours you can use MyChart to ask questions about today's visit, request a non-urgent call back, or ask for a work or school excuse. You will get an email in the next two days asking about your experience. I hope that your e-visit has been valuable and will speed your recovery.  5 minutes spent reviewing and documenting in chart.

## 2018-12-01 ENCOUNTER — Telehealth (INDEPENDENT_AMBULATORY_CARE_PROVIDER_SITE_OTHER): Payer: Self-pay

## 2018-12-01 MED FILL — XARELTO 20 MG TABLET: 20 | 30 days supply | Qty: 30 | Fill #0

## 2018-12-01 NOTE — Telephone Encounter (Signed)
Patient has been diagnosed by ED with cellulitis on hip that she had surgery.  She is on an antibiotic.  She was told to let Dr. [name] know.  Let her know if she needs to do anything else.

## 2018-12-01 NOTE — Telephone Encounter (Signed)
S/w patient. appt scheduled.

## 2018-12-01 NOTE — Telephone Encounter (Signed)
FYI

## 2018-12-01 NOTE — Telephone Encounter (Signed)
We need to see her Monday in the office.

## 2018-12-04 ENCOUNTER — Ambulatory Visit (INDEPENDENT_AMBULATORY_CARE_PROVIDER_SITE_OTHER): Payer: 59 | Admitting: Orthopaedic Surgery

## 2018-12-06 ENCOUNTER — Other Ambulatory Visit: Payer: Self-pay

## 2018-12-06 ENCOUNTER — Encounter (INDEPENDENT_AMBULATORY_CARE_PROVIDER_SITE_OTHER): Payer: Self-pay | Admitting: Orthopaedic Surgery

## 2018-12-06 ENCOUNTER — Ambulatory Visit (INDEPENDENT_AMBULATORY_CARE_PROVIDER_SITE_OTHER): Payer: 59 | Admitting: Orthopaedic Surgery

## 2018-12-06 ENCOUNTER — Ambulatory Visit (INDEPENDENT_AMBULATORY_CARE_PROVIDER_SITE_OTHER): Payer: 59

## 2018-12-06 DIAGNOSIS — Z96642 Presence of left artificial hip joint: Secondary | ICD-10-CM

## 2018-12-06 MED ORDER — DOXYCYCLINE HYCLATE 100 MG PO TABS: 100.0000 mg | ORAL_TABLET | Freq: Two times a day (BID) | ORAL | 0 refills | Status: DC

## 2018-12-06 MED FILL — DOXYCYCLINE HYCLATE 100 MG: 100 | 15 days supply | Qty: 30 | Fill #0

## 2018-12-06 NOTE — Progress Notes (Signed)
Office Visit Note   Patient: Monica [name]           Date of Birth: September 25, 1955           MRN: [id number] Visit Date: 12/06/2018              Requested by: [name], MD [address] [place], [place] [address] PCP: [name], MD   Assessment & Plan: Visit Diagnoses:  1. History of left hip replacement     Plan: Although she is doing much better overall I would like to put her on doxycycline twice a day for the next 2 weeks.  All questions concerns were answered and addressed.  I would like to see her back in a month to see how she is doing overall but no x-rays are needed.  Follow-Up Instructions: Return in about 4 weeks (around 01/03/2019).   Orders:  Orders Placed This Encounter  Procedures  . XR HIP UNILAT W OR W/O PELVIS 1V LEFT   Meds ordered this encounter  Medications  . doxycycline (VIBRA-TABS) 100 MG tablet    Sig: Take 1 tablet (100 mg total) by mouth 2 (two) times daily.    Dispense:  30 tablet    Refill:  0      Procedures: No procedures performed   Clinical Data: No additional findings.   Subjective: Chief Complaint  Patient presents with  . Left Hip - Follow-up  Patient is a very pleasant 63 year old patient of mine who is been recovering 8 months out from a left total hip arthroplasty.  She has had no problems with that hip at all but recently was seen at [facility] for some cellulitis around her left hip area it was just posterior to the incision in the gluteal area.  She was given IV antibiotics and then clindamycin for 5 days and this cleared things up.  She never had any pain but just had some itching and redness and warmth to the area.  She says that is all gone.  She denies any recent illnesses and denies feeling sick.  She denies any hip pain at all right now.  She does weigh 253 pounds.  She is very active and walks with no assistive device.  She has a remote history of a right total hip arthroplasty as well as our  one that we did on the left side again 8 months ago.  HPI  Review of Systems She currently denies any headache, chest pain, shortness of breath, fever, chills, nausea, vomiting  Objective: Vital Signs: LMP 06/19/2013   Physical Exam She is alert and orient x3 and in no acute distress Ortho Exam Examination of her left hip shows that it moves fluidly with no pain at all.  I examined the skin and the incision itself and there is no evidence of cellulitis warmth or wounds. Specialty Comments:  No specialty comments available.  Imaging: Xr Hip Unilat W Or W/o Pelvis 1v Left  Result Date: 12/06/2018 An AP pelvis and a lateral left hip shows bilateral total hip arthroplasties.  The left more recent hip shows no periosteal reaction around the implant or radiolucencies to suggest loosening or infection.    PMFS History: Patient Active Problem List   Diagnosis Date Noted  . Status post left hip replacement 04/07/2018  . Unilateral primary osteoarthritis, left hip 02/23/2018  . 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 History of total hip replacement, right 11/17/2017  .  Chronic anticoagulation 11/19/2016  . Tinnitus 11/19/2016  . Right knee pain 07/06/2016  . Left hip pain 10/21/2015  . Gastroesophageal reflux disease 09/19/2015  . Itchy eyes 03/12/2015  . Allergic conjunctivitis 03/12/2015  . OSA (obstructive sleep apnea) 02/20/2015  . Morbid obesity ([place]) 01/15/2015  . Lymphedema 06/19/2014  . Personal history of colonic polyps - large hyperplastic 12/25/2013  . Skin infection 10/14/2013  . Candidiasis of female genitalia 10/14/2013  . IFG (impaired fasting glucose) 10/14/2013  . Carpal tunnel syndrome 10/14/2013  . Cough 06/20/2013  . Allergic rhinitis, cause unspecified 06/20/2013  . Backache 06/05/2013  . Edema 12/20/2012  . Swelling of limb 11/30/2012  . Essential hypertension, benign 11/30/2012  . Headache(784.0) 11/30/2012  . Avascular necrosis of hip ([place])  07/21/2012  . Atrial fibrillation ([place]) 07/06/2012  . Hypertension 09/14/2011  . Psoriasis 08/16/2005   Past Medical History:  Diagnosis Date  . Atrial fibrillation ([place])    a. s/p DCCV in 12/2012 b. repeat DCCV in 09/2014 - started on Flecainide  . Avascular necrosis of bone of right hip ([place])   . Dysrhythmia    a fib  . Hypertension   . Morbid obesity ([place])   . Osteoarthritis   . Personal history of colonic polyps - large hyperplastic 12/25/2013  . Pre-diabetes   . Psoriasis    active breakout left buttocks  . Right hip pain   . Shortness of breath   . Sleep apnea    uses mouth guard only  . Tachycardia, unspecified     Family History  Problem Relation Age of Onset  . Hypertension Mother   . Diabetes Mother   . Alzheimer's disease Mother   . Thyroid disease Mother   . Diabetes Son     Past Surgical History:  Procedure Laterality Date  . CARDIOVERSION N/A 01/09/2013   Procedure: CARDIOVERSION;  Surgeon: [name], MD;  [facility] END[facility];  Service: Cardiovascular;  Laterality: N/A;  . CARDIOVERSION N/A 10/07/2014   Procedure: CARDIOVERSION;  Surgeon: [name], MD;  Location: [facility] ENDOSCOPY;  Service: Cardiovascular;  Laterality: N/A;  09:00 Lido 60mg ,IV followed by Propofol  70mg /IV    synched electrocardioversion at 120 joules for Afib,repeated at 200 joules, 70 mg...successfully changed to SR  . FRACTURE SURGERY  07/21/12   right hip arthroplasty  . JOINT REPLACEMENT     Left hip Dr. [name] 04-07-18  . TOTAL HIP ARTHROPLASTY  07/21/2012   Procedure: TOTAL HIP ARTHROPLASTY;  Surgeon: [name], MD;  Location: [facility] ORS;  Service: Orthopedics;  Laterality: Right;  . TOTAL HIP ARTHROPLASTY Left 04/07/2018   Procedure: LEFT TOTAL HIP ARTHROPLASTY ANTERIOR APPROACH;  Surgeon: [name], MD;  Location: [facility] ORS;  Service: Orthopedics;  Laterality: Left;  Needs RNFA   Social History   Occupational History  . Occupation: ED ADMISSIONS      Employer: [employer]    Comment: [employer]   Tobacco Use  . Smoking status: Former Smoker    Types: Cigarettes    Last attempt to quit: 09/13/1982    Years since quitting: 36.2  . Smokeless tobacco: Never Used  Substance and Sexual Activity  . Alcohol use: Yes    Alcohol/week: 0.0 standard drinks    Comment: Occasionally  . Drug use: No  . Sexual activity: Yes    Partners: Male

## 2018-12-07 ENCOUNTER — Encounter: Payer: Self-pay | Admitting: Pharmacist

## 2018-12-07 MED FILL — METHOTREXATE SODIUM 2.5 MG: 2.5 | 28 days supply | Qty: 24 | Fill #0

## 2018-12-07 NOTE — Progress Notes (Signed)
Patient emailed me to let me know that she is off of Cosentyx because it was not working for her and plans to start [place] once the [place] outbreak has improved. She will notify me when she starts the [place].

## 2018-12-11 DIAGNOSIS — L400 Psoriasis vulgaris: Secondary | ICD-10-CM | POA: Diagnosis not present

## 2018-12-11 DIAGNOSIS — Z79899 Other long term (current) drug therapy: Secondary | ICD-10-CM | POA: Diagnosis not present

## 2018-12-19 ENCOUNTER — Telehealth: Payer: Self-pay | Admitting: Orthopaedic Surgery

## 2018-12-19 NOTE — Telephone Encounter (Signed)
Patient states the Doxycycline is making her very nausea.  Call back # [phone]

## 2018-12-19 NOTE — Telephone Encounter (Signed)
Please advise. Thanks.  

## 2018-12-20 MED FILL — TRULICITY 1.5 MG/0.5 ML PEN: 1.5 | 28 days supply | Qty: 2 | Fill #4

## 2018-12-20 NOTE — Telephone Encounter (Signed)
I called patient to advise, mailbox is full. Unable to leave message. 

## 2018-12-20 NOTE — Telephone Encounter (Signed)
She can stop it at this point since it has been two week since I saw her last and she has been on antibiotics for a while now.

## 2018-12-20 NOTE — Telephone Encounter (Signed)
I called patient and advised. 

## 2018-12-20 NOTE — Telephone Encounter (Signed)
Tried calling, no answer. Unable to LM VM is full.

## 2019-01-02 MED FILL — METHOTREXATE SODIUM 2.5 MG: 2.5 | 28 days supply | Qty: 24 | Fill #0

## 2019-01-03 ENCOUNTER — Other Ambulatory Visit: Payer: Self-pay | Admitting: Cardiology

## 2019-01-03 ENCOUNTER — Encounter: Payer: Self-pay | Admitting: Internal Medicine

## 2019-01-03 DIAGNOSIS — I480 Paroxysmal atrial fibrillation: Secondary | ICD-10-CM

## 2019-01-03 MED ORDER — RIVAROXABAN 20 MG PO TABS: 20.0000 mg | ORAL_TABLET | Freq: Every day | ORAL | 0 refills | Status: DC

## 2019-01-04 ENCOUNTER — Encounter: Payer: Self-pay | Admitting: Orthopaedic Surgery

## 2019-01-04 ENCOUNTER — Ambulatory Visit (INDEPENDENT_AMBULATORY_CARE_PROVIDER_SITE_OTHER): Payer: 59 | Admitting: Orthopaedic Surgery

## 2019-01-04 ENCOUNTER — Other Ambulatory Visit: Payer: Self-pay

## 2019-01-04 DIAGNOSIS — Z03818 Encounter for observation for suspected exposure to other biological agents ruled out: Secondary | ICD-10-CM | POA: Diagnosis not present

## 2019-01-04 DIAGNOSIS — Z96642 Presence of left artificial hip joint: Secondary | ICD-10-CM | POA: Diagnosis not present

## 2019-01-04 MED FILL — XARELTO 20 MG TABLET: 20 | 30 days supply | Qty: 30 | Fill #0

## 2019-01-04 NOTE — Progress Notes (Signed)
I am seeing the patient today just to really check on her left hip.  She is 9 months out from left total hip arthroplasty.  Over a month ago she developed some cellulitis at her backside on the operative side.  This was treated successfully with oral antibiotics.  She is pain-free and doing well.  She is had no recurrence of her cellulitis.  She is walking without a limp.  On exam I can easily put her left hip the range of motion with no pain.  Her incisions well-healed.  There is no evidence of cellulitis on the thigh or the gluteal area.  At this point we will see her back in 3 months which will be the 1 year standpoint from surgery.  We will have a standing low AP pelvis and lateral of her left hip at that visit.

## 2019-01-10 MED FILL — FLECAINIDE ACETATE 50 MG TA: 50 | 90 days supply | Qty: 180 | Fill #3

## 2019-01-13 ENCOUNTER — Telehealth: Payer: Self-pay | Admitting: *Deleted

## 2019-01-13 NOTE — Telephone Encounter (Signed)
I called pt and left a voicemail asking her to call us back in reference to her recent visit to [facility] at [phone]. This call was made in reference to potentially being exposed to an employee that tested positive  For COVID-19 at [facility].

## 2019-01-21 ENCOUNTER — Encounter (HOSPITAL_COMMUNITY): Payer: Self-pay

## 2019-01-21 ENCOUNTER — Ambulatory Visit (HOSPITAL_COMMUNITY)
Admission: EM | Admit: 2019-01-21 | Discharge: 2019-01-21 | Disposition: A | Discharge status: Home / Self Care | Payer: 59 | Attending: Family Medicine | Admitting: Family Medicine

## 2019-01-21 ENCOUNTER — Other Ambulatory Visit: Payer: Self-pay

## 2019-01-21 DIAGNOSIS — T7840XA Allergy, unspecified, initial encounter: Secondary | ICD-10-CM | POA: Diagnosis not present

## 2019-01-21 MED ORDER — METHYLPREDNISOLONE SODIUM SUCC 125 MG IJ SOLR
80.0000 mg | Freq: Once | INTRAMUSCULAR | Status: AC
  Administered 2019-01-21: 80 mg via INTRAMUSCULAR

## 2019-01-21 MED ORDER — CLINDAMYCIN HCL 300 MG PO CAPS: 300.0000 mg | ORAL_CAPSULE | Freq: Three times a day (TID) | ORAL | 0 refills | Status: DC

## 2019-01-21 MED ORDER — METHYLPREDNISOLONE SODIUM SUCC 125 MG IJ SOLR
INTRAMUSCULAR | Status: AC
  Filled 2019-01-21: qty 2

## 2019-01-21 MED ORDER — CETIRIZINE HCL 10 MG PO CHEW: 10.0000 mg | CHEWABLE_TABLET | Freq: Two times a day (BID) | ORAL | 0 refills | Status: DC

## 2019-01-21 NOTE — ED Provider Notes (Signed)
[place]    CSN: [id number] Arrival date & time: 01/21/19  1620     History   Chief Complaint Chief Complaint  Patient presents with  . celluitis    HPI Monica [name] is a 63 y.o. female.   HPI  Here with a chief complaint of "cellulitis". She states that this is the second or third time she has had it this year. She has been treated with clindamycin.  She feels like this helped the rash.  She was also treated with doxycycline.  She states that she had to stop it because it upset her stomach. She has no fever.  She has a large area of redness on her left hip.  She states that it is hot, swollen, and has some slight itching.  She states that itched more last time she had it.  She has no pain.  She has no fever.  She has no nausea or vomiting.  She has no distal swelling. She reports in addition that she had a hip replacement surgery done about 6 months ago.  Past Medical History:  Diagnosis Date  . Atrial fibrillation ([place])    a. s/p DCCV in 12/2012 b. repeat DCCV in 09/2014 - started on Flecainide  . Avascular necrosis of bone of right hip ([place])   . Dysrhythmia    a fib  . Hypertension   . Morbid obesity ([place])   . Osteoarthritis   . Personal history of colonic polyps - large hyperplastic 12/25/2013  . Pre-diabetes   . Psoriasis    active breakout left buttocks  . Right hip pain   . Shortness of breath   . Sleep apnea    uses mouth guard only  . Tachycardia, unspecified     Patient Active Problem List   Diagnosis Date Noted  . Status post left hip replacement 04/07/2018  . Unilateral primary osteoarthritis, left hip 02/23/2018  . Psoriatic arthropathy ([place]) 11/17/2017  . History of total hip replacement, right 11/17/2017  . Chronic anticoagulation 11/19/2016  . Tinnitus 11/19/2016  . Right knee pain 07/06/2016  . Left hip pain 10/21/2015  . Gastroesophageal reflux disease 09/19/2015  . Itchy eyes 03/12/2015  . Allergic conjunctivitis 03/12/2015   . OSA (obstructive sleep apnea) 02/20/2015  . Morbid obesity ([place]) 01/15/2015  . Lymphedema 06/19/2014  . Personal history of colonic polyps - large hyperplastic 12/25/2013  . Skin infection 10/14/2013  . Candidiasis of female genitalia 10/14/2013  . IFG (impaired fasting glucose) 10/14/2013  . Carpal tunnel syndrome 10/14/2013  . Cough 06/20/2013  . Allergic rhinitis, cause unspecified 06/20/2013  . Backache 06/05/2013  . Edema 12/20/2012  . Swelling of limb 11/30/2012  . Essential hypertension, benign 11/30/2012  . Headache(784.0) 11/30/2012  . Avascular necrosis of hip ([place]) 07/21/2012  . Atrial fibrillation ([place]) 07/06/2012  . Hypertension 09/14/2011  . Psoriasis 08/16/2005    Past Surgical History:  Procedure Laterality Date  . CARDIOVERSION N/A 01/09/2013   Procedure: CARDIOVERSION;  Surgeon: [name], MD;  Location: [facility] ENDOSCOPY;  Service: Cardiovascular;  Laterality: N/A;  . CARDIOVERSION N/A 10/07/2014   Procedure: CARDIOVERSION;  Surgeon: [name], MD;  Location: [facility] ENDOSCOPY;  Service: Cardiovascular;  Laterality: N/A;  09:00 Lido 60mg ,IV followed by Propofol  70mg /IV    synched electrocardioversion at 120 joules for Afib,repeated at 200 joules, 70 mg...successfully changed to SR  . FRACTURE SURGERY  07/21/12   right hip arthroplasty  . JOINT REPLACEMENT     Left hip  Dr. [name] 04-07-18  . TOTAL HIP ARTHROPLASTY  07/21/2012   Procedure: TOTAL HIP ARTHROPLASTY;  Surgeon: [name], MD;  Location: [facility] ORS;  Service: Orthopedics;  Laterality: Right;  . TOTAL HIP ARTHROPLASTY Left 04/07/2018   Procedure: LEFT TOTAL HIP ARTHROPLASTY ANTERIOR APPROACH;  Surgeon: [name], MD;  Location: [facility] ORS;  Service: Orthopedics;  Laterality: Left;  Needs RNFA    OB History   No obstetric history on file.      Home Medications    Prior to Admission medications   Medication Sig Start Date End Date Taking? Authorizing Provider  acetaminophen  (TYLENOL) 650 MG CR tablet Take 650-1,300 mg by mouth every 8 (eight) hours as needed for pain.    [provider]  BYSTOLIC 10 MG tablet Take 5 mg by mouth every evening.  11/03/16   [provider]  calcium carbonate (TUMS EX) 750 MG chewable tablet Chew 1-3 tablets by mouth 3 (three) times daily as needed for heartburn.    [provider]  cetirizine (ZYRTEC) 10 MG chewable tablet Chew 1 tablet (10 mg total) by mouth 2 (two) times a day. 01/21/19   Raylene Everts, MD  cetirizine (ZYRTEC) 10 MG tablet Take 10 mg by mouth every evening.     [provider]  Cholecalciferol (VITAMIN D3) 2000 units TABS Take 2,000 Units by mouth every evening.    [provider]  clindamycin (CLEOCIN) 300 MG capsule Take 1 capsule (300 mg total) by mouth 3 (three) times daily. 01/21/19   Raylene Everts, MD  clobetasol cream (TEMOVATE) 7.32 % Apply 1 application topically 2 (two) times daily as needed (for psoriasis).    [provider]  desonide (DESOWEN) 0.05 % cream Apply 1 application topically 2 (two) times daily as needed (for psoriasis).  11/04/17   [provider]  diclofenac sodium (VOLTAREN) 1 % GEL Apply 4 g topically 4 (four) times daily. Patient taking differently: Apply 4 g topically 4 (four) times daily as needed (pain).  07/01/16   Hudnall, Sharyn Lull, MD  docusate sodium (COLACE) 100 MG capsule Take 100-200 mg by mouth 2 (two) times daily as needed (for constipation.).    [provider]  doxycycline (VIBRA-TABS) 100 MG tablet Take 1 tablet (100 mg total) by mouth 2 (two) times daily. 12/06/18   [name], MD  flecainide (TAMBOCOR) 50 MG tablet TAKE 1 TABLET (50 MG TOTAL) BY MOUTH 2 (TWO) TIMES DAILY. 04/11/18   [name], MD  fluticasone (FLONASE) 50 MCG/ACT nasal spray Place 2 sprays into both nostrils as needed. Patient taking differently: Place 2 sprays into both nostrils as needed for allergies.  02/07/17  07/09/20  Sharion Balloon, FNP  folic acid (FOLVITE) 1 MG tablet TAKE 2 TABLETS (2 MG TOTAL) BY MOUTH DAILY. 04/18/18   Bo Merino, MD  hydrochlorothiazide (HYDRODIURIL) 25 MG tablet Take 25 mg by mouth every evening.  09/14/17   [provider]  HYDROcodone-acetaminophen (NORCO/VICODIN) 5-325 MG tablet Take 1 tablet by mouth every 6 (six) hours as needed (for pain.).    [provider]  methocarbamol (ROBAXIN) 750 MG tablet Take 1 tablet (750 mg total) by mouth 2 (two) times daily as needed for muscle spasms. 04/09/18   Leandrew Koyanagi, MD  methotrexate 2.5 MG tablet TAKE 8 TABLETS (20 MG TOTAL) BY MOUTH ONCE A WEEK. 04/13/18   Bo Merino, MD  Multiple Vitamin (MULTIVITAMIN WITH MINERALS) TABS tablet Take 1 tablet by  mouth daily.    [provider]  ondansetron (ZOFRAN) 4 MG tablet Take 1 tablet (4 mg total) by mouth every 8 (eight) hours as needed for nausea or vomiting. Patient taking differently: Take 4 mg by mouth every Friday. Takes with methotrexate 11/24/17   Bo Merino, MD  ondansetron (ZOFRAN) 4 MG tablet Take 1-2 tablets (4-8 mg total) by mouth every 8 (eight) hours as needed for nausea or vomiting. 04/09/18   Leandrew Koyanagi, MD  oxyCODONE (OXY IR/ROXICODONE) 5 MG immediate release tablet Take 1-2 tablets (5-10 mg total) by mouth every 4 (four) hours as needed. 04/20/18   [name], MD  potassium chloride (K-DUR,KLOR-CON) 10 MEQ tablet Take 1 tablet (10 mEq total) by mouth 2 (two) times daily. Patient taking differently: Take 10 mEq by mouth every evening.  01/24/14   Zanard, Bernadene Bell, MD  promethazine (PHENERGAN) 25 MG tablet Take 1 tablet (25 mg total) by mouth every 6 (six) hours as needed for nausea. 04/09/18   Leandrew Koyanagi, MD  Propylene Glycol-Glycerin (ARTIFICIAL TEARS) 1-0.3 % SOLN Place 1 drop into both eyes 3 (three) times daily as needed (for dry eyes).    [provider]  rivaroxaban (XARELTO) 20 MG TABS tablet Take 1  tablet (20 mg total) by mouth daily with supper. 01/03/19   Lyda Jester M, PA-C  Secukinumab, 300 MG Dose, (COSENTYX, 300 MG DOSE,) 150 MG/ML SOSY Inject 2 mLs into the skin as directed. Inject 2 ml below the skin as directed every 4 weeks 10/03/18   Tresa Garter, MD  sertraline (ZOLOFT) 50 MG tablet Take 50 mg by mouth every evening.  12/08/16   [provider]  TRULICITY 1.5 AO/1.3YQ SOPN Inject 1.5 mg into the skin once a week. Saturdays or Sundays. 11/04/17   [provider]  Vitamins-Lipotropics (LIPO-FLAVONOID PLUS PO) Take 1 tablet by mouth daily as needed (for tinnitus (ear ringing supplement OTC)).    [provider]    Family History Family History  Problem Relation Age of Onset  . Hypertension Mother   . Diabetes Mother   . Alzheimer's disease Mother   . Thyroid disease Mother   . Diabetes Son     Social History Social History   Tobacco Use  . Smoking status: Former Smoker    Types: Cigarettes    Last attempt to quit: 09/13/1982    Years since quitting: 36.3  . Smokeless tobacco: Never Used  Substance Use Topics  . Alcohol use: Yes    Alcohol/week: 0.0 standard drinks    Comment: Occasionally  . Drug use: No     Allergies   Patient has no known allergies.   Review of Systems Review of Systems  Constitutional: Negative for chills and fever.  HENT: Negative for ear pain and sore throat.   Eyes: Negative for pain and visual disturbance.  Respiratory: Negative for cough and shortness of breath.   Cardiovascular: Negative for chest pain and palpitations.  Gastrointestinal: Negative for abdominal pain and vomiting.  Genitourinary: Negative for dysuria and hematuria.  Musculoskeletal: Negative for arthralgias and back pain.  Skin: Positive for rash. Negative for color change.  Neurological: Negative for seizures and syncope.  All other systems reviewed and are negative.    Physical Exam Triage Vital Signs ED Triage  Vitals  Enc Vitals Group     BP 01/21/19 1646 120/79     Pulse Rate 01/21/19 1646 74     Resp 01/21/19 1646 18  Temp 01/21/19 1646 98.2 F (36.8 C)     Temp Source 01/21/19 1646 Oral     SpO2 01/21/19 1646 100 %     Weight 01/21/19 1647 240 lb (108.9 kg)     Height --      Head Circumference --      Peak Flow --      Pain Score 01/21/19 1647 6     Pain Loc --      Pain Edu? --      Excl. in [place]? --    No data found.  Updated Vital Signs BP 120/79 (BP Location: Right Arm)   Pulse 74   Temp 98.2 F (36.8 C) (Oral)   Resp 18   Wt 108.9 kg   LMP 06/19/2013   SpO2 100%   BMI 41.20 kg/m   Visual Acuity Right Eye Distance:   Left Eye Distance:   Bilateral Distance:    Right Eye Near:   Left Eye Near:    Bilateral Near:     Physical Exam Constitutional:      General: She is not in acute distress.    Appearance: She is well-developed. She is obese. She is not ill-appearing.  HENT:     Head: Normocephalic and atraumatic.  Eyes:     Conjunctiva/sclera: Conjunctivae normal.     Pupils: Pupils are equal, round, and reactive to light.  Neck:     Musculoskeletal: Normal range of motion.  Cardiovascular:     Rate and Rhythm: Normal rate.  Pulmonary:     Effort: Pulmonary effort is normal. No respiratory distress.  Abdominal:     General: There is no distension.     Palpations: Abdomen is soft.  Musculoskeletal: Normal range of motion.  Skin:    General: Skin is warm and dry.     Comments: See photograph.  Large area of erythema.  No tenderness.  Slight soft tissue swelling.  No open skin.  There is mild intertrigo under stomach pannus.  Small patches of psoriasis noted on upper border.  No open skin  Neurological:     General: No focal deficit present.     Mental Status: She is alert.  Psychiatric:        Mood and Affect: Mood normal.        Behavior: Behavior normal.        UC Treatments / Results  Labs (all labs ordered are listed, but only abnormal  results are displayed) Labs Reviewed - No data to display  EKG None  Radiology No results found.  Procedures Procedures (including critical care time)  Medications Ordered in UC Medications  methylPREDNISolone sodium succinate (SOLU-MEDROL) 125 mg/2 mL injection 80 mg (has no administration in time range)    Initial Impression / Assessment and Plan / UC Course  I have reviewed the triage vital signs and the nursing notes.  Pertinent labs & imaging results that were available during my care of the patient were reviewed by me and considered in my medical decision making (see chart for details).     I explained to the patient that this looks more allergic to me than infection.  It would be unusual for infection not to hurt.  With itching, swelling, and warmth, I believe it more closely resembles an allergic reaction or large hive.  I would treat her with a steroid shot and antihistamines.  She can watch for couple days.  If it gets worse instead of better she may need clindamycin.  I cannot think of anything that would cause a local allergic reaction and she worries it may be due to her hip surgery.  I will send a copy of this note to her orthopedic surgeon to see if he has any ideas. Final Clinical Impressions(s) / UC Diagnoses   Final diagnoses:  Allergic reaction, initial encounter     Discharge Instructions       I am giving you a shot of steroid This is a potent anti-allergy treatment I also want you to increase cetirizine to 2 times a day I expect you will see improvement in this rash within 12 to 24 hours  I am prescribing for you clindamycin to take if needed Please hold off on the clindamycin for 1 to 2 days if you can. Take clindamycin right away if the rash becomes much larger, if the rash becomes painful, if you have temperature over 100.4    ED Prescriptions    Medication Sig Dispense Auth. Provider   clindamycin (CLEOCIN) 300 MG capsule Take 1 capsule (300 mg  total) by mouth 3 (three) times daily. 30 capsule Raylene Everts, MD   cetirizine (ZYRTEC) 10 MG chewable tablet Chew 1 tablet (10 mg total) by mouth 2 (two) times a day. 30 tablet Raylene Everts, MD     Controlled Substance Prescriptions Center Controlled Substance Registry consulted? Not Applicable   Raylene Everts, MD 01/21/19 [phone]

## 2019-01-21 NOTE — Discharge Instructions (Addendum)
I am giving you a shot of steroid This is a potent anti-allergy treatment I also want you to increase cetirizine to 2 times a day I expect you will see improvement in this rash within 12 to 24 hours  I am prescribing for you clindamycin to take if needed Please hold off on the clindamycin for 1 to 2 days if you can. Take clindamycin right away if the rash becomes much larger, if the rash becomes painful, if you have temperature over 100.4

## 2019-01-21 NOTE — ED Triage Notes (Signed)
Pt cc she has cellulitis on left hip. Pt states she notice it on her left hip yesterday.

## 2019-01-23 MED FILL — CLINDAMYCIN HCL 300 MG CAPS: 300 | 10 days supply | Qty: 30 | Fill #0

## 2019-01-23 MED FILL — TRULICITY 1.5 MG/0.5 ML PEN: 1.5 | 28 days supply | Qty: 2 | Fill #5

## 2019-01-30 MED FILL — SERTRALINE HCL 50 MG TABS: 50 | 30 days supply | Qty: 30 | Fill #0

## 2019-01-30 MED FILL — METHOTREXATE SODIUM 2.5 MG: 2.5 | 28 days supply | Qty: 24 | Fill #1

## 2019-02-01 ENCOUNTER — Other Ambulatory Visit: Payer: Self-pay | Admitting: Cardiology

## 2019-02-01 DIAGNOSIS — I480 Paroxysmal atrial fibrillation: Secondary | ICD-10-CM

## 2019-02-01 NOTE — Telephone Encounter (Signed)
°*  STAT* If patient is at the pharmacy, call can be transferred to refill team.   1. Which medications need to be refilled? (please list name of each medication and dose if known)  rivaroxaban (XARELTO) 20 MG TABS tablet  2. Which pharmacy/location (including street and city if local pharmacy) is medication to be sent to? [place], [place] - [place]   3. Do they need a 30 day or 90 day supply? 90  Patient has two pills left. She is scheduled for a telemedicine visit with Jory Sims on 02/08/19

## 2019-02-02 MED ORDER — RIVAROXABAN 20 MG PO TABS: 20.0000 mg | ORAL_TABLET | Freq: Every day | ORAL | 0 refills | Status: DC

## 2019-02-02 MED FILL — XARELTO 20 MG TABLET: 20 | 90 days supply | Qty: 90 | Fill #0

## 2019-02-02 NOTE — Telephone Encounter (Signed)
63yo Female Wt = 108.9kg on 6/20/202 Scr = 0.81 on 03/2018 Last OV 02/2018 with Almyra Deforest F/u visit scheduled for 02/08/2019

## 2019-02-06 MED FILL — FOLIC ACID 1 MG TABS: 1 | 90 days supply | Qty: 90 | Fill #0

## 2019-02-07 ENCOUNTER — Telehealth: Payer: Self-pay | Admitting: Adult Health

## 2019-02-07 NOTE — Telephone Encounter (Signed)
I left a voice message for pt to call and confirm her 02-08-19 appt with Jory Sims.

## 2019-02-08 ENCOUNTER — Telehealth (INDEPENDENT_AMBULATORY_CARE_PROVIDER_SITE_OTHER): Payer: 59 | Admitting: Adult Health

## 2019-02-08 ENCOUNTER — Encounter: Payer: Self-pay | Admitting: Adult Health

## 2019-02-08 VITALS — Ht 64.0 in | Wt 240.0 lb

## 2019-02-08 DIAGNOSIS — I480 Paroxysmal atrial fibrillation: Secondary | ICD-10-CM | POA: Diagnosis not present

## 2019-02-08 DIAGNOSIS — I10 Essential (primary) hypertension: Secondary | ICD-10-CM | POA: Diagnosis not present

## 2019-02-08 MED ORDER — RIVAROXABAN 20 MG PO TABS: 20.0000 mg | ORAL_TABLET | Freq: Every day | ORAL | 1 refills | Status: DC

## 2019-02-08 NOTE — Progress Notes (Signed)
Virtual Visit via Video Note   This visit type was conducted due to national recommendations for restrictions regarding the COVID-19 Pandemic (e.g. social distancing) in an effort to limit this patient's exposure and mitigate transmission in our community.  Due to her co-morbid illnesses, this patient is at least at moderate risk for complications without adequate follow up.  This format is felt to be most appropriate for this patient at this time.  All issues noted in this document were discussed and addressed.  A limited physical exam was performed with this format.  Please refer to the patient's chart for her consent to telehealth for [facility].   Date:  02/08/2019   ID:  Monica [name], DOB 10/18/1955, MRN [id number]  Patient Location: Home Provider Location: Office  PCP:  [name], MD  Cardiologist:  Kirk Ruths, MD  Electrophysiologist:  None   Evaluation Performed:  Follow-Up Visit  Chief Complaint:  Follow up  History of Present Illness:    Monica [name] is a 63 y.o. female we are seeing for ongoing assessment and management of PAF, currently treated with flecainide, hypertension, with other history of diabetes type 2 and morbid obesity.  She has a history of cardioversion in May 2014 and February 2016.  Echocardiogram in 2016 revealed an EF of 55%, mild to moderate MR, S mildly dilated right atrium, moderate TR, pressure of 48 mmHg.  The patient also had an exercise tolerance test obtained on the same day which was negative for ischemia however is overall poor quality and the patient was only able to exercise for 2 minutes duration.  Not achieve heart rate  She was last seen in the office by Almyra Deforest, PA on 02/23/2018, for preoperative evaluation to have left total hip repair.  It was found that she did not need any further work-up since patient is able to complete at least 4 METS of activity. Her RCRI risk score is class I, 0.4% risk of Major Cardiac Event  She is  doing well today on video visit.  She is not complaining of any rapid heart rhythm, she is compliant with flecainide and rivaroxaban.  Patient is now recovering from cellulitis from recent hip repair on the right.  She has been told that it may be an allergic reaction to the new hip that was placed.  She is following up with orthopedics for ongoing evaluation and management and is currently on antibiotic therapy.  Blood pressure is staying well controlled as far she knows at home.  We do not have a current blood pressure as she does not have a cuff at home.  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  She is in good spirits and has no complaints today.  She has been tested for COVID antibodies and was found to be negative.   The patient does not  have symptoms concerning for COVID-19 infection (fever, chills, cough, or new shortness of breath).    Past Medical History:  Diagnosis Date  . Atrial fibrillation ([place])    a. s/p DCCV in 12/2012 b. repeat DCCV in 09/2014 - started on Flecainide  . Avascular necrosis of bone of right hip ([place])   . Dysrhythmia    a fib  . Hypertension   . Morbid obesity ([place])   . Osteoarthritis   . Personal history of colonic polyps - large hyperplastic 12/25/2013  . Pre-diabetes   . Psoriasis    active breakout left  buttocks  . Right hip pain   . Shortness of breath   . Sleep apnea    uses mouth guard only  . Tachycardia, unspecified    Past Surgical History:  Procedure Laterality Date  . CARDIOVERSION N/A 01/09/2013   Procedure: CARDIOVERSION;  Surgeon: [name], MD;  Location: [facility] ENDOSCOPY;  Service: Cardiovascular;  Laterality: N/A;  . CARDIOVERSION N/A 10/07/2014   Procedure: CARDIOVERSION;  Surgeon: [name], MD;  Location: [facility] ENDOSCOPY;  Service: Cardiovascular;  Laterality: N/A;  09:00 Lido 60mg ,IV followed by Propofol  70mg /IV    synched electrocardioversion at 120 joules for Afib,repeated at 200 joules, 70  mg...successfully changed to SR  . FRACTURE SURGERY  07/21/12   right hip arthroplasty  . JOINT REPLACEMENT     Left hip Dr. [name] 04-07-18  . TOTAL HIP ARTHROPLASTY  07/21/2012   Procedure: TOTAL HIP ARTHROPLASTY;  Surgeon: [name], MD;  Location: [facility] ORS;  Service: Orthopedics;  Laterality: Right;  . TOTAL HIP ARTHROPLASTY Left 04/07/2018   Procedure: LEFT TOTAL HIP ARTHROPLASTY ANTERIOR APPROACH;  Surgeon: [name], MD;  Location: [facility] ORS;  Service: Orthopedics;  Laterality: Left;  Needs RNFA     Current Meds  Medication Sig  . acetaminophen (TYLENOL) 650 MG CR tablet Take 650-1,300 mg by mouth every 8 (eight) hours as needed for pain.  Marland Kitchen BYSTOLIC 10 MG tablet Take 5 mg by mouth every evening.   . calcium carbonate (TUMS EX) 750 MG chewable tablet Chew 1-3 tablets by mouth 3 (three) times daily as needed for heartburn.  . Cholecalciferol (VITAMIN D3) 2000 units TABS Take 2,000 Units by mouth every evening.  . clobetasol cream (TEMOVATE) 5.28 % Apply 1 application topically 2 (two) times daily as needed (for psoriasis).  Marland Kitchen desonide (DESOWEN) 0.05 % cream Apply 1 application topically 2 (two) times daily as needed (for psoriasis).   Marland Kitchen diclofenac sodium (VOLTAREN) 1 % GEL Apply 4 g topically 4 (four) times daily. (Patient taking differently: Apply 4 g topically 4 (four) times daily as needed (pain). )  . docusate sodium (COLACE) 100 MG capsule Take 100-200 mg by mouth 2 (two) times daily as needed (for constipation.).  Marland Kitchen flecainide (TAMBOCOR) 50 MG tablet TAKE 1 TABLET (50 MG TOTAL) BY MOUTH 2 (TWO) TIMES DAILY.  . fluticasone (FLONASE) 50 MCG/ACT nasal spray Place 2 sprays into both nostrils as needed. (Patient taking differently: Place 2 sprays into both nostrils as needed for allergies. )  . folic acid (FOLVITE) 1 MG tablet TAKE 2 TABLETS (2 MG TOTAL) BY MOUTH DAILY.  . methotrexate 2.5 MG tablet TAKE 8 TABLETS (20 MG TOTAL) BY MOUTH ONCE A WEEK.  . Multiple Vitamin  (MULTIVITAMIN WITH MINERALS) TABS tablet Take 1 tablet by mouth daily.  . potassium chloride (K-DUR,KLOR-CON) 10 MEQ tablet Take 1 tablet (10 mEq total) by mouth 2 (two) times daily. (Patient taking differently: Take 10 mEq by mouth every evening. )  . Propylene Glycol-Glycerin (ARTIFICIAL TEARS) 1-0.3 % SOLN Place 1 drop into both eyes 3 (three) times daily as needed (for dry eyes).  . rivaroxaban (XARELTO) 20 MG TABS tablet Take 1 tablet (20 mg total) by mouth daily with supper.  . sertraline (ZOLOFT) 50 MG tablet Take 50 mg by mouth every evening.   . TRULICITY 1.5 UX/3.2GM SOPN Inject 1.5 mg into the skin once a week. Saturdays or Sundays.  . [DISCONTINUED] hydrochlorothiazide (HYDRODIURIL) 25 MG tablet Take 25 mg by mouth every evening.   . [  DISCONTINUED] rivaroxaban (XARELTO) 20 MG TABS tablet Take 1 tablet (20 mg total) by mouth daily with supper.     Allergies:   Patient has no known allergies.   Social History   Tobacco Use  . Smoking status: Former Smoker    Types: Cigarettes    Quit date: 09/13/1982    Years since quitting: 36.4  . Smokeless tobacco: Never Used  Substance Use Topics  . Alcohol use: Yes    Alcohol/week: 0.0 standard drinks    Comment: Occasionally  . Drug use: No     Family Hx: The patient's family history includes Alzheimer's disease in her mother; Diabetes in her mother and son; Hypertension in her mother; Thyroid disease in her mother.  ROS:   Please see the history of present illness.    All other systems reviewed and are negative.   Prior CV studies:   The following studies were reviewed today: ETT 11-05-14    Echo 11-05-2014 LV EF: 55% Study Conclusions  - Left ventricle: The cavity size was normal. Wall thickness was normal. The estimated ejection fraction was 55%. - Mitral valve: There was mild to moderate regurgitation. - Left atrium: The atrium was severely dilated. - Right atrium: The atrium was mildly dilated. - Atrial  septum: There was increased thickness of the septum, consistent with lipomatous hypertrophy. No defect or patent foramen ovale was identified. - Tricuspid valve: There was moderate regurgitation. - Pulmonary arteries: PA peak pressure: 48 mm Hg (S).  Labs/Other Tests and Data Reviewed:    EKG:  No ECG reviewed.  Recent Labs: 04/08/2018: BUN 9; Creatinine, Ser 0.69; Potassium 3.5; Sodium 139 04/09/2018: Hemoglobin 9.1; Platelets 247   Recent Lipid Panel Lab Results  Component Value Date/Time   CHOL 149 07/24/2013 09:22 AM   TRIG 101 07/24/2013 09:22 AM   HDL 42 07/24/2013 09:22 AM   CHOLHDL 3.5 07/24/2013 09:22 AM   LDLCALC 87 07/24/2013 09:22 AM    Wt Readings from Last 3 Encounters:  02/08/19 240 lb (108.9 kg)  01/21/19 240 lb (108.9 kg)  04/07/18 253 lb (114.8 kg)     Objective:    Vital Signs:  Ht 5\' 4"  (1.626 m)   Wt 240 lb (108.9 kg)   LMP 06/19/2013   BMI 41.20 kg/m    VITAL SIGNS:  reviewed GEN:  no acute distress NEURO:  alert and oriented x 3, no obvious focal deficit PSYCH:  normal affect  ASSESSMENT & PLAN:    1.  Atrial fibrillation: She denies rapid heart rhythm palpitations or feelings as if her heart is irregular.  She continues on flecainide.  I do not have an EKG to confirm if she is in normal sinus rhythm or well-controlled atrial fibrillation at this time.  She continues on Xarelto as directed.  I will not make any changes in regimen unless she becomes symptomatic.  She will follow-up in the office with Dr. Stanford Breed in 6 months.  She has not seen Dr. Stanford Breed in over a year and would like to see him in person this time.  2.  Hypertension: Is on Bystolic blood pressure is well controlled by patient's self-report.  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  No changes in her medication regimen at this time.  3.  Cellulitis: Has been treated with 2 courses of antibiotics.  She is going to follow-up with her orthopedic surgeon  as this occurs at the site of her recent hip replacement.  COVID-19 Education: The signs and symptoms of COVID-19 were discussed with the patient and how to seek care for testing (follow up with PCP or arrange E-visit).  The importance of social distancing was discussed today.  Time:   Today, I have spent 10 minutes minutes with the patient with telehealth technology discussing the above problems.     Medication Adjustments/Labs and Tests Ordered: Current medicines are reviewed at length with the patient today.  Concerns regarding medicines are outlined above.   Tests Ordered: No orders of the defined types were placed in this encounter.   Medication Changes: Meds ordered this encounter  Medications  . rivaroxaban (XARELTO) 20 MG TABS tablet    Sig: Take 1 tablet (20 mg total) by mouth daily with supper.    Dispense:  90 tablet    Refill:  1    Disposition:  Follow up 6 months with Dr. Stanford Breed with EKG  Signed, Phill Myron. [place] Pugh, ANP, AACC  02/08/2019 4:28 PM    [facility]

## 2019-02-08 NOTE — Patient Instructions (Signed)
Follow-Up: You will need a follow up appointment in 6 months with Kirk Ruths, MD only.  Please call our office 2 months in advance, October 2020, to schedule this, December 2020, appointment.  You may see Kirk Ruths, MD Jory Sims, DNP, AACC or one of the following Advanced Practice Providers on your designated Care Team:  Kerin Ransom, PA-C  Roby Lofts, PA-C Sande Rives, [place]        Medication Instructions:  The current medical regimen is effective;  continue present plan and medications as directed. Please refer to the Current Medication list given to you today. If you need a refill on your cardiac medications before your next appointment, please call your pharmacy. Labwork: When you have labs (blood work) and your tests are completely normal, you will receive your results ONLY by [place] (if you have MyChart) -OR- A paper copy in the mail.  At [facility], you and your health needs are our priority.  As part of our continuing mission to provide you with exceptional heart care, we have created designated Provider Care Teams.  These Care Teams include your primary Cardiologist (physician) and Advanced Practice Providers (APPs -  Physician Assistants and Nurse Practitioners) who all work together to provide you with the care you need, when you need it.  Thank you for choosing [facility] at [facility]!!

## 2019-02-09 DIAGNOSIS — E119 Type 2 diabetes mellitus without complications: Secondary | ICD-10-CM | POA: Diagnosis not present

## 2019-02-09 DIAGNOSIS — I10 Essential (primary) hypertension: Secondary | ICD-10-CM | POA: Diagnosis not present

## 2019-02-09 DIAGNOSIS — R5383 Other fatigue: Secondary | ICD-10-CM | POA: Diagnosis not present

## 2019-02-23 MED FILL — TRULICITY 1.5 MG/0.5 ML PEN: 1.5 | 28 days supply | Qty: 2 | Fill #0

## 2019-03-01 DIAGNOSIS — R6889 Other general symptoms and signs: Secondary | ICD-10-CM | POA: Diagnosis not present

## 2019-03-01 DIAGNOSIS — L309 Dermatitis, unspecified: Secondary | ICD-10-CM | POA: Diagnosis not present

## 2019-03-01 DIAGNOSIS — E876 Hypokalemia: Secondary | ICD-10-CM | POA: Diagnosis not present

## 2019-03-01 DIAGNOSIS — E119 Type 2 diabetes mellitus without complications: Secondary | ICD-10-CM | POA: Diagnosis not present

## 2019-03-01 DIAGNOSIS — H9313 Tinnitus, bilateral: Secondary | ICD-10-CM | POA: Diagnosis not present

## 2019-03-01 DIAGNOSIS — J309 Allergic rhinitis, unspecified: Secondary | ICD-10-CM | POA: Diagnosis not present

## 2019-03-01 DIAGNOSIS — I10 Essential (primary) hypertension: Secondary | ICD-10-CM | POA: Diagnosis not present

## 2019-03-01 MED FILL — HYDROCHLOROTHIAZIDE 25 MG T: 25 | 90 days supply | Qty: 90 | Fill #0

## 2019-03-01 MED FILL — SERTRALINE HCL 50 MG TABS: 50 | 90 days supply | Qty: 90 | Fill #0

## 2019-03-01 MED FILL — FLUTICASONE PROP 50 MCG SPR: 50 | 30 days supply | Qty: 16 | Fill #0

## 2019-03-01 MED FILL — POTASSIUM CHL ER M10 TABLET: 10 | 30 days supply | Qty: 120 | Fill #0

## 2019-03-01 MED FILL — AZELASTINE HCL 0.05 % SOLN: 0.05 | 25 days supply | Qty: 6 | Fill #0

## 2019-03-05 MED FILL — METHOTREXATE SODIUM 2.5 MG: 2.5 | 28 days supply | Qty: 24 | Fill #0

## 2019-03-27 MED FILL — METHOTREXATE SODIUM 2.5 MG: 2.5 | 28 days supply | Qty: 24 | Fill #0

## 2019-03-29 MED FILL — TRULICITY 1.5 MG/0.5 ML PEN: 1.5 | 28 days supply | Qty: 2 | Fill #0

## 2019-04-05 ENCOUNTER — Ambulatory Visit (INDEPENDENT_AMBULATORY_CARE_PROVIDER_SITE_OTHER): Payer: 59

## 2019-04-05 ENCOUNTER — Ambulatory Visit (INDEPENDENT_AMBULATORY_CARE_PROVIDER_SITE_OTHER): Payer: 59 | Admitting: Orthopaedic Surgery

## 2019-04-05 ENCOUNTER — Encounter: Payer: Self-pay | Admitting: Orthopaedic Surgery

## 2019-04-05 ENCOUNTER — Other Ambulatory Visit: Payer: Self-pay

## 2019-04-05 DIAGNOSIS — Z96642 Presence of left artificial hip joint: Secondary | ICD-10-CM | POA: Diagnosis not present

## 2019-04-05 NOTE — Progress Notes (Signed)
Office Visit Note   Patient: Monica [name]           Date of Birth: 1956/06/22           MRN: [id number] Visit Date: 04/05/2019              Requested by: Monica Resides, MD [address] [name],  [name] [id number] PCP: Monica Resides, MD   Assessment & Plan: Visit Diagnoses:  1. History of total left hip replacement     Plan: Due to the fact the patient's had multiple episodes of cellulitis and the fact that she continues to have pain after a year of having her left total hip performed recommend MRI to evaluate the left hip for abscess or evidence of hardware loosening.  We will have her follow-up after the MRI to go over results and discuss further treatment.  Follow-Up Instructions: Return After MRI.   Orders:  Orders Placed This Encounter  Procedures  . XR HIP UNILAT W OR W/O PELVIS 1V LEFT   No orders of the defined types were placed in this encounter.     Procedures: No procedures performed   Clinical Data: No additional findings.   Subjective: Chief Complaint  Patient presents with  . Left Hip - Follow-up    HPI Monica [name] is a 63 year old female who underwent a left total hip arthroplasty 04/07/2018.  She states that since her last visit in April she has developed pain in her hip with flexion of the hip and with flexion and abduction of the hip.  She has had no further episodes of cellulitis but did have 4 episodes of this postop.  She is having no fevers or chills.  She has had no known injury to the hip. Review of Systems Please see HPI otherwise negative or noncontributory  Objective: Vital Signs: LMP 06/19/2013   Physical Exam General: Well-developed well-nourished female no acute distress mood and affect appropriate. Psych alert and oriented x3 Ortho Exam Bilateral hips she has good range of motion both hips with extreme internal rotation of the left hip and circumflexion of the left hip causes pain.  Flexion of the left hip  causes significant pain.  She has no tenderness over the trochanteric region. Specialty Comments:  No specialty comments available.  Imaging: Xr Hip Unilat W Or W/o Pelvis 1v Left  Result Date: 04/05/2019 AP Pelvis and lateral left hip: AP pelvis lateral view of the left hip: Bilateral hips well located.  Status post bilateral total hip arthroplasties.  Components of both hips appear well seated on the AP view lateral view of the left hip shows well-seated left total hip arthroplasty without any complications.  No acute fractures.    PMFS History: Patient Active Problem List   Diagnosis Date Noted  . Status post left hip replacement 04/07/2018  . Unilateral primary osteoarthritis, left hip 02/23/2018  . Psoriatic arthropathy (Monica [name]) 11/17/2017  . History of total hip replacement, right 11/17/2017  . Chronic anticoagulation 11/19/2016  . Tinnitus 11/19/2016  . Right knee pain 07/06/2016  . Left hip pain 10/21/2015  . Gastroesophageal reflux disease 09/19/2015  . Itchy eyes 03/12/2015  . Allergic conjunctivitis 03/12/2015  . OSA (obstructive sleep apnea) 02/20/2015  . Morbid obesity (Monica [name]) 01/15/2015  . Lymphedema 06/19/2014  . Personal history of colonic polyps - large hyperplastic 12/25/2013  . Skin infection 10/14/2013  . Candidiasis of female genitalia 10/14/2013  . IFG (impaired fasting glucose) 10/14/2013  .  Carpal tunnel syndrome 10/14/2013  . Cough 06/20/2013  . Allergic rhinitis, cause unspecified 06/20/2013  . Backache 06/05/2013  . Edema 12/20/2012  . Swelling of limb 11/30/2012  . Essential hypertension, benign 11/30/2012  . Headache(784.0) 11/30/2012  . Avascular necrosis of hip (Monica [name]) 07/21/2012  . Atrial fibrillation (Monica [name]) 07/06/2012  . Hypertension 09/14/2011  . Psoriasis 08/16/2005   Past Medical History:  Diagnosis Date  . Atrial fibrillation (Monica [name])    a. s/p DCCV in 12/2012 b. repeat DCCV in 09/2014 - started on Flecainide  . Avascular necrosis of bone of  right hip (Monica [name])   . Dysrhythmia    a fib  . Hypertension   . Morbid obesity (Monica [name])   . Osteoarthritis   . Personal history of colonic polyps - large hyperplastic 12/25/2013  . Pre-diabetes   . Psoriasis    active breakout left buttocks  . Right hip pain   . Shortness of breath   . Sleep apnea    uses mouth guard only  . Tachycardia, unspecified     Family History  Problem Relation Age of Onset  . Hypertension Mother   . Diabetes Mother   . Alzheimer's disease Mother   . Thyroid disease Mother   . Diabetes Son     Past Surgical History:  Procedure Laterality Date  . CARDIOVERSION N/A 01/09/2013   Procedure: CARDIOVERSION;  Surgeon: [name], MD;  Location: [name] ENDOSCOPY;  Service: Cardiovascular;  Laterality: N/A;  . CARDIOVERSION N/A 10/07/2014   Procedure: CARDIOVERSION;  Surgeon: [name], MD;  Location: [name]OS[facility];  Service: Cardiovascular;  Laterality: N/A;  09:00 Lido 60mg ,IV followed by Propofol  70mg /IV    synched electrocardioversion at 120 joules for Afib,repeated at 200 joules, 70 mg...successfully changed to SR  . FRACTURE SURGERY  07/21/12   right hip arthroplasty  . JOINT REPLACEMENT     Left hip Dr. [name] 04-07-18  . TOTAL HIP ARTHROPLASTY  07/21/2012   Procedure: TOTAL HIP ARTHROPLASTY;  Surgeon: [name], MD;  Location: [name];  Service: Orthopedics;  Laterality: Right;  . TOTAL HIP ARTHROPLASTY Left 04/07/2018   Procedure: LEFT TOTAL HIP ARTHROPLASTY ANTERIOR APPROACH;  Surgeon: [name], MD;  Location: [name];  Service: Orthopedics;  Laterality: Left;  Needs RNFA   Social History   Occupational History  . Occupation: ED ADMISSIONS     Employer: [name]    Comment: [employer]   Tobacco Use  . Smoking status: Former Smoker    Types: Cigarettes    Quit date: 09/13/1982    Years since quitting: 36.5  . Smokeless tobacco: Never Used  Substance and Sexual Activity  . Alcohol use: Yes    Alcohol/week: 0.0  standard drinks    Comment: Occasionally  . Drug use: No  . Sexual activity: Yes    Partners: Male

## 2019-04-06 ENCOUNTER — Other Ambulatory Visit: Payer: Self-pay

## 2019-04-06 DIAGNOSIS — Z96642 Presence of left artificial hip joint: Secondary | ICD-10-CM

## 2019-04-13 ENCOUNTER — Other Ambulatory Visit: Payer: Self-pay | Admitting: Cardiology

## 2019-04-13 MED FILL — FLECAINIDE ACETATE 50 MG TA: 50 | 90 days supply | Qty: 180 | Fill #0

## 2019-04-25 MED FILL — METHOTREXATE SODIUM 2.5 MG: 2.5 | 28 days supply | Qty: 24 | Fill #0

## 2019-04-25 MED FILL — TRULICITY 1.5 MG/0.5 ML PEN: 1.5 | 28 days supply | Qty: 2 | Fill #1

## 2019-04-25 MED FILL — BYSTOLIC 10 MG TABLET: 10 | 90 days supply | Qty: 90 | Fill #0

## 2019-05-02 MED FILL — XARELTO 20 MG TABLET: 20 | 90 days supply | Qty: 90 | Fill #0

## 2019-05-02 MED FILL — FLUTICASONE PROP 50 MCG SPR: 50 | 30 days supply | Qty: 16 | Fill #1

## 2019-05-10 ENCOUNTER — Encounter: Payer: Self-pay | Admitting: Radiology

## 2019-05-10 DIAGNOSIS — Z79899 Other long term (current) drug therapy: Secondary | ICD-10-CM | POA: Diagnosis not present

## 2019-05-10 DIAGNOSIS — L400 Psoriasis vulgaris: Secondary | ICD-10-CM | POA: Diagnosis not present

## 2019-05-11 MED FILL — FOLIC ACID 1 MG TABS: 1 | 90 days supply | Qty: 90 | Fill #0

## 2019-05-17 ENCOUNTER — Telehealth (INDEPENDENT_AMBULATORY_CARE_PROVIDER_SITE_OTHER): Payer: 59 | Admitting: Pharmacist

## 2019-05-17 DIAGNOSIS — Z79899 Other long term (current) drug therapy: Secondary | ICD-10-CM

## 2019-05-17 MED ORDER — ENBREL SURECLICK 50 MG/ML ~~LOC~~ SOAJ: SUBCUTANEOUS | 3 refills | Status: DC

## 2019-05-17 NOTE — Progress Notes (Signed)
   S: Patient presents for review of their specialty medication therapy.  Patient is currently taking Enbrel for psoriasis. Patient is managed by Danella Sensing for this.   Adherence: has not started medication yet  Efficacy: has been on Enbrel in the past and it worked well for her. It started to wear off so she tried a new medication but Dr. Ronnald Ramp would like for her to try Enbrel again.  Dosing: Plaque psoriasis: SubQ: Initial: 50 mg twice weekly for 3 months (starting doses of 25 or 50 mg once weekly have also been used successfully) Maintenance dose: 50 mg once weekly  Screening: TB test: completed per patient Hepatitis B: completed per patient  Monitoring: Injection site reactions: denies any reactions in the past S/sx of infections: denies S/sx of malignancy: denies GI upset: denies from previous use.    O:     Lab Results  Component Value Date   WBC 7.2 04/09/2018   HGB 9.1 (L) 04/09/2018   HCT 28.5 (L) 04/09/2018   MCV 92.5 04/09/2018   PLT 247 04/09/2018      Chemistry      Component Value Date/Time   NA 139 04/08/2018 0502   NA 144 11/19/2016 1630   K 3.5 04/08/2018 0502   CL 103 04/08/2018 0502   CO2 29 04/08/2018 0502   BUN 9 04/08/2018 0502   BUN 6 11/19/2016 1630   CREATININE 0.69 04/08/2018 0502   CREATININE 0.80 10/20/2017 1614      Component Value Date/Time   CALCIUM 8.8 (L) 04/08/2018 0502   ALKPHOS 99 07/24/2013 0922   AST 11 10/20/2017 1614   ALT 14 10/20/2017 1614   BILITOT 0.6 10/20/2017 1614       A/P: 1. Medication review: patient currently prescribed Enbrel for psoriasis. Was on in the past and tolerated well, it just lost its effect over time. Her doctor would like her to try it again. Reviewed the medication with the patient, including the following: Enbrel (etanercept) binds tumor necrosis factor (TNF) and blocks its interaction with cell surface receptors. TNF plays an important role in the inflammatory processes of many  diseases. Patient educated on purpose, proper use and potential adverse effects of Enbrel. SubQ: Administer subcutaneously. Rotate injection sites; may inject into the thigh (preferred), abdomen (avoiding the 2-inch area around the navel), or outer areas of upper arm. New injections should be given at least 1 inch from an old site and never into areas where the skin is tender, bruised, red, or hard; any raised thick, red, or scaly skin patches or lesions; or areas with scars or stretch marks. For a more comfortable injection, allow autoinjectors, prefilled syringes, and dose trays to reach room temperature for 15 to 30 minutes (?30 minutes for autoinjector) prior to injection; do not remove the needle cover while allowing product to reach room temperature. There may be small white particles of protein in the solution; this is not unusual for proteinaceous solutions. Possible adverse effects include rash, GI upset, increased risk of infection, and injection site reactions. Patients should stop Enbrel if they develop a serious infection. There is a possible increased risk in lymphoma and other malignancies. No recommendations for any changes.   Christella Hartigan, PharmD, BCPS, BCACP, CPP Clinical Pharmacist Practitioner  [phone]

## 2019-05-23 MED FILL — ENBREL SURECLICK 50 MG/ML S: 50 | 28 days supply | Qty: 8 | Fill #0

## 2019-05-29 MED FILL — SHINGRIX 50 MCG SUS: 50 | 1 days supply | Qty: 1 | Fill #0

## 2019-05-30 MED FILL — TRULICITY 1.5 MG/0.5 ML PEN: 1.5 | 28 days supply | Qty: 2 | Fill #2

## 2019-06-01 MED FILL — SERTRALINE HCL 50 MG TABLET: 50 | 90 days supply | Qty: 90 | Fill #1

## 2019-06-01 MED FILL — HYDROCHLOROTHIAZIDE 25 MG T: 25 | 90 days supply | Qty: 90 | Fill #1

## 2019-06-27 MED FILL — TRULICITY 1.5 MG/0.5 ML PEN: 1.5 | 28 days supply | Qty: 2 | Fill #3

## 2019-07-19 ENCOUNTER — Other Ambulatory Visit: Payer: Self-pay | Admitting: Cardiology

## 2019-07-19 MED ORDER — FLECAINIDE ACETATE 50 MG PO TABS: ORAL_TABLET | ORAL | 0 refills | Status: DC

## 2019-07-19 MED FILL — ENBREL SURECLICK 50 MG/ML S: 50 | 28 days supply | Qty: 8 | Fill #1

## 2019-07-19 MED FILL — FLECAINIDE ACETATE 50 MG TA: 50 | 90 days supply | Qty: 180 | Fill #0

## 2019-07-19 MED FILL — hydrOXYzine HCL 25 MG TABS: 25 | 30 days supply | Qty: 30 | Fill #0

## 2019-07-19 NOTE — Telephone Encounter (Signed)
°*  STAT* If patient is at the pharmacy, call can be transferred to refill team.   1. Which medications need to be refilled? (please list name of each medication and dose if known) flecainide (TAMBOCOR) 50 MG tablet  2. Which pharmacy/location (including street and city if local pharmacy) is medication to be sent to? [place], [place] - [place]  3. Do they need a 30 day or 90 day supply? 90   Pt will run out of Medication on Saturday

## 2019-07-19 NOTE — Telephone Encounter (Signed)
Requested Prescriptions   Signed Prescriptions Disp Refills  . flecainide (TAMBOCOR) 50 MG tablet 180 tablet 0    Sig: TAKE 1 TABLET (50 MG TOTAL) BY MOUTH 2 (TWO) TIMES DAILY. *NEEDS OFFICE VISIT FOR FURTHER REFILLS*    Authorizing Provider: [name]    Ordering User: NEWCOMER MCCLAIN, Estephani Popper L

## 2019-07-30 MED FILL — TRULICITY 1.5 MG/0.5 ML PEN: 1.5 | 28 days supply | Qty: 2 | Fill #4

## 2019-08-01 MED FILL — XARELTO 20 MG TABLET: 20 | 90 days supply | Qty: 90 | Fill #1

## 2019-08-07 MED FILL — DESONIDE 0.05% CREAM: 0.05 | 20 days supply | Qty: 60 | Fill #0

## 2019-08-07 MED FILL — CLOBETASOL 0.05% SOLUTION: 0.05 | 30 days supply | Qty: 50 | Fill #1

## 2019-08-07 MED FILL — CLOBETASOL PROPIONATE 0.05: 0.05 | 30 days supply | Qty: 60 | Fill #1

## 2019-08-07 MED FILL — CLOBETASOL PROP 0.05% OINT: 0.05 | 30 days supply | Qty: 60 | Fill #1

## 2019-08-13 DIAGNOSIS — L400 Psoriasis vulgaris: Secondary | ICD-10-CM | POA: Diagnosis not present

## 2019-08-13 MED FILL — METHOTREXATE SODIUM 2.5 MG: 2.5 | 28 days supply | Qty: 24 | Fill #0

## 2019-08-13 MED FILL — FOLIC ACID 1 MG TABS: 1 | 90 days supply | Qty: 90 | Fill #0

## 2019-08-14 MED FILL — SHINGRIX 50 MCG SUS: 50 | 1 days supply | Qty: 1 | Fill #0

## 2019-08-14 MED FILL — hydrOXYzine HCL 25 MG TABS: 25 | 30 days supply | Qty: 30 | Fill #1

## 2019-08-16 ENCOUNTER — Ambulatory Visit: Payer: 59

## 2019-08-21 MED FILL — BYSTOLIC 10 MG TABLET: 10 | 90 days supply | Qty: 90 | Fill #1

## 2019-08-22 MED FILL — TRULICITY 1.5 MG/0.5 ML PEN: 1.5 | 28 days supply | Qty: 2 | Fill #5

## 2019-08-30 ENCOUNTER — Telehealth: Payer: Self-pay | Admitting: *Deleted

## 2019-08-30 NOTE — Telephone Encounter (Signed)
A message was left, re: her follow up visit. 

## 2019-09-04 MED FILL — HYDROCHLOROTHIAZIDE 25 MG T: 25 | 90 days supply | Qty: 90 | Fill #2

## 2019-09-04 MED FILL — SERTRALINE HCL 50 MG TABLET: 50 | 30 days supply | Qty: 30 | Fill #0

## 2019-09-11 MED FILL — METHOTREXATE SODIUM 2.5 MG: 2.5 | 28 days supply | Qty: 24 | Fill #0

## 2019-09-17 ENCOUNTER — Ambulatory Visit (INDEPENDENT_AMBULATORY_CARE_PROVIDER_SITE_OTHER): Payer: 59 | Admitting: Cardiology

## 2019-09-17 ENCOUNTER — Encounter: Payer: Self-pay | Admitting: Cardiology

## 2019-09-17 ENCOUNTER — Other Ambulatory Visit: Payer: Self-pay

## 2019-09-17 VITALS — BP 115/73 | HR 72 | Temp 97.9°F | Ht 65.0 in | Wt 258.0 lb

## 2019-09-17 DIAGNOSIS — I480 Paroxysmal atrial fibrillation: Secondary | ICD-10-CM | POA: Diagnosis not present

## 2019-09-17 DIAGNOSIS — I10 Essential (primary) hypertension: Secondary | ICD-10-CM | POA: Diagnosis not present

## 2019-09-17 NOTE — Progress Notes (Signed)
HPI: FU atrial fibrillation. Patient underwent cardioversion on 01/09/2013 to sinus rhythm. Patient developed recurrent atrial fibrillation and had a repeat cardioversion successfully on flecainide. Echocardiogram February 2016 showed normal LV function, mild to moderate mitral regurgitation, severe left atrial enlargement and mild right atrial enlargement. Moderate tricuspid regurgitation with moderately elevated pulmonary pressures. Follow-up exercise treadmill on flecanide did not show significant arrhythmia. Since I last saw her, she has dyspnea with more vigorous activities but not routine activities.  No orthopnea, PND, chest pain, palpitations or syncope.  No bleeding.  Current Outpatient Medications  Medication Sig Dispense Refill  . acetaminophen (TYLENOL) 650 MG CR tablet Take 650-1,300 mg by mouth every 8 (eight) hours as needed for pain.    Marland Kitchen BYSTOLIC 10 MG tablet Take 5 mg by mouth every evening.   3  . calcium carbonate (TUMS EX) 750 MG chewable tablet Chew 1-3 tablets by mouth 3 (three) times daily as needed for heartburn.    . Cholecalciferol (VITAMIN D3) 2000 units TABS Take 2,000 Units by mouth every evening.    . clobetasol cream (TEMOVATE) [id number] % Apply 1 application topically 2 (two) times daily as needed (for psoriasis).    Marland Kitchen desonide (DESOWEN) 0.05 % cream Apply 1 application topically 2 (two) times daily as needed (for psoriasis).   0  . diclofenac sodium (VOLTAREN) 1 % GEL Apply 4 g topically 4 (four) times daily. (Patient taking differently: Apply 4 g topically 4 (four) times daily as needed (pain). ) 5 Tube 1  . docusate sodium (COLACE) 100 MG capsule Take 100-200 mg by mouth 2 (two) times daily as needed (for constipation.).    Marland Kitchen etanercept (ENBREL SURECLICK) 50 MG/ML injection inject 1 ml twice weekly at bedtime for 3 months then inject 1 ml once a week 8 mL 3  . flecainide (TAMBOCOR) 50 MG tablet TAKE 1 TABLET (50 MG TOTAL) BY MOUTH 2 (TWO) TIMES DAILY. *NEEDS  OFFICE VISIT FOR FURTHER REFILLS* 180 tablet 0  . fluticasone (FLONASE) 50 MCG/ACT nasal spray Place 2 sprays into both nostrils as needed. (Patient taking differently: Place 2 sprays into both nostrils as needed for allergies. ) 16 g 0  . folic acid (FOLVITE) 1 MG tablet TAKE 2 TABLETS (2 MG TOTAL) BY MOUTH DAILY. 180 tablet 0  . hydrochlorothiazide (HYDRODIURIL) 25 MG tablet Take by mouth.    . methotrexate 2.5 MG tablet TAKE 8 TABLETS (20 MG TOTAL) BY MOUTH ONCE A WEEK. 96 tablet 0  . Multiple Vitamin (MULTIVITAMIN WITH MINERALS) TABS tablet Take 1 tablet by mouth daily.    . potassium chloride (K-DUR,KLOR-CON) 10 MEQ tablet Take 1 tablet (10 mEq total) by mouth 2 (two) times daily. (Patient taking differently: Take 10 mEq by mouth every evening. ) 180 tablet 3  . Propylene Glycol-Glycerin (ARTIFICIAL TEARS) 1-0.3 % SOLN Place 1 drop into both eyes 3 (three) times daily as needed (for dry eyes).    . rivaroxaban (XARELTO) 20 MG TABS tablet Take 1 tablet (20 mg total) by mouth daily with supper. 90 tablet 1  . sertraline (ZOLOFT) 50 MG tablet Take 50 mg by mouth every evening.   1  . TRULICITY 1.5 0000000 SOPN Inject 1.5 mg into the skin once a week. Saturdays or Sundays.  5   No current facility-administered medications for this visit.     Past Medical History:  Diagnosis Date  . Atrial fibrillation ([place])    a. s/p DCCV in 12/2012 b. repeat DCCV in  09/2014 - started on Flecainide  . Avascular necrosis of bone of right hip ([place])   . Dysrhythmia    a fib  . Hypertension   . Morbid obesity ([place])   . Osteoarthritis   . Personal history of colonic polyps - large hyperplastic 12/25/2013  . Pre-diabetes   . Psoriasis    active breakout left buttocks  . Right hip pain   . Shortness of breath   . Sleep apnea    uses mouth guard only  . Tachycardia, unspecified     Past Surgical History:  Procedure Laterality Date  . CARDIOVERSION N/A 01/09/2013   Procedure: CARDIOVERSION;  Surgeon:  [name], MD;  Location: [facility] ENDOSCOPY;  Service: Cardiovascular;  Laterality: N/A;  . CARDIOVERSION N/A 10/07/2014   Procedure: CARDIOVERSION;  Surgeon: [name], MD;  Location: [facility] ENDOSCOPY;  Service: Cardiovascular;  Laterality: N/A;  09:00 Lido 60mg ,IV followed by Propofol  70mg /IV    synched electrocardioversion at 120 joules for Afib,repeated at 200 joules, 70 mg...successfully changed to SR  . FRACTURE SURGERY  07/21/12   right hip arthroplasty  . JOINT REPLACEMENT     Left hip Dr. [name] 04-07-18  . TOTAL HIP ARTHROPLASTY  07/21/2012   Procedure: TOTAL HIP ARTHROPLASTY;  Surgeon: [name], MD;  Location: [facility] ORS;  Service: Orthopedics;  Laterality: Right;  . TOTAL HIP ARTHROPLASTY Left 04/07/2018   Procedure: LEFT TOTAL HIP ARTHROPLASTY ANTERIOR APPROACH;  Surgeon: [name], MD;  Location: [facility] ORS;  Service: Orthopedics;  Laterality: Left;  Needs RNFA    Social History   Socioeconomic History  . Marital status: Divorced    Spouse name: [name]  . Number of children: 1  . Years of education: 8  . Highest education level: Not on file  Occupational History  . Occupation: ED ADMISSIONS     Employer:     Comment: [employer]   Tobacco Use  . Smoking status: Former Smoker    Types: Cigarettes    Quit date: 09/13/1982    Years since quitting: 37.0  . Smokeless tobacco: Never Used  Substance and Sexual Activity  . Alcohol use: Yes    Alcohol/week: 0.0 standard drinks    Comment: Occasionally  . Drug use: No  . Sexual activity: Yes    Partners: Male  Other Topics Concern  . 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    EducationField seismologist    Tobacco Use/Exposure: Former Smoker.  She used to smoke 4 cigarettes per day for five  years and quit 32 years ago.    Alcohol Use:  Occasional   Drug Use:  None   Diet:  Regular   Exercise:  Water Aerobics (2 x per week)     Hobbies: Shopping, Cycling             Social Determinants of Health   Financial Resource Strain:   . Difficulty of Paying Living Expenses: Not on file  Food Insecurity:   . Worried About Charity fundraiser in the Last Year: Not on file  . Ran Out of Food in the Last Year: Not on file  Transportation Needs:   . Lack of Transportation (Medical): Not on file  . Lack of  Transportation (Non-Medical): Not on file  Physical Activity:   . Days of Exercise per Week: Not on file  . Minutes of Exercise per Session: Not on file  Stress:   . Feeling of Stress : Not on file  Social Connections:   . Frequency of Communication with Friends and Family: Not on file  . Frequency of Social Gatherings with Friends and Family: Not on file  . Attends Religious Services: Not on file  . Active Member of Clubs or Organizations: Not on file  . Attends Archivist Meetings: Not on file  . Marital Status: Not on file  Intimate Partner Violence:   . Fear of Current or Ex-Partner: Not on file  . Emotionally Abused: Not on file  . Physically Abused: Not on file  . Sexually Abused: Not on file    Family History  Problem Relation Age of Onset  . Hypertension Mother   . Diabetes Mother   . Alzheimer's disease Mother   . Thyroid disease Mother   . Diabetes Son     ROS: no fevers or chills, productive cough, hemoptysis, dysphasia, odynophagia, melena, hematochezia, dysuria, hematuria, rash, seizure activity, orthopnea, PND, pedal edema, claudication. Remaining systems are negative.  Physical Exam: Well-developed obese in no acute distress.  Skin is warm and dry.  HEENT is normal.  Neck is supple.  Chest is clear to auscultation with normal expansion.  Cardiovascular exam is regular rate and rhythm.  Abdominal exam nontender or distended. No masses  palpated. Extremities show no edema. neuro grossly intact  ECG-sinus rhythm at a rate of 66, nonspecific ST changes.  Personally reviewed  A/P  1 paroxysmal atrial fibrillation-patient is in sinus rhythm today.  Continue flecainide at present dose.  Continue Xarelto.  Check hemoglobin and renal function.  2 hypertension-blood pressure controlled.  Continue present medications and follow.  3 morbid obesity-we discussed the importance of diet, exercise and weight loss.  4 edema-recently well controlled.  Continue present dose of HCTZ.  Check potassium and renal function.  5 history of mild to moderate mitral regurgitation and moderate tricuspid regurgitation-repeat echocardiogram.  Kirk Ruths, MD

## 2019-09-17 NOTE — Patient Instructions (Addendum)
Medication Instructions:  NO CHANGE *If you need a refill on your cardiac medications before your next appointment, please call your pharmacy*  Lab Work: Your physician recommends that you HAVE LAB WORK TODAY If you have labs (blood work) drawn today and your tests are completely normal, you will receive your results only by: Marland Kitchen MyChart Message (if you have MyChart) OR . A paper copy in the mail If you have any lab test that is abnormal or we need to change your treatment, we will call you to review the results.  Testing: Your physician has requested that you have an echocardiogram. Echocardiography is a painless test that uses sound waves to create images of your heart. It provides your doctor with information about the size and shape of your heart and how well your heart's chambers and valves are working. This procedure takes approximately one hour. There are no restrictions for this procedure.[place]    Follow-Up: At [facility], you and your health needs are our priority.  As part of our continuing mission to provide you with exceptional heart care, we have created designated Provider Care Teams.  These Care Teams include your primary Cardiologist (physician) and Advanced Practice Providers (APPs -  Physician Assistants and Nurse Practitioners) who all work together to provide you with the care you need, when you need it.  Your next appointment:   12 month(s)  The format for your next appointment:   Either In Person or Virtual  Provider:   You may see Kirk Ruths, MD or one of the following Advanced Practice Providers on your designated Care Team:    Kerin Ransom, PA-C  [place], [place]  Coletta Memos, [place]

## 2019-09-18 ENCOUNTER — Other Ambulatory Visit: Payer: Self-pay | Admitting: *Deleted

## 2019-09-18 ENCOUNTER — Telehealth: Payer: Self-pay | Admitting: *Deleted

## 2019-09-18 DIAGNOSIS — E876 Hypokalemia: Secondary | ICD-10-CM

## 2019-09-18 DIAGNOSIS — I10 Essential (primary) hypertension: Secondary | ICD-10-CM

## 2019-09-18 DIAGNOSIS — I480 Paroxysmal atrial fibrillation: Secondary | ICD-10-CM

## 2019-09-18 LAB — CBC
Hematocrit: 35.2 % (ref 34.0–46.6)
Hemoglobin: 11.7 g/dL (ref 11.1–15.9)
MCH: 29 pg (ref 26.6–33.0)
MCHC: 33.2 g/dL (ref 31.5–35.7)
MCV: 87 fL (ref 79–97)
Platelets: 274 10*3/uL (ref 150–450)
RBC: 4.03 x10E6/uL (ref 3.77–5.28)
RDW: 13.9 % (ref 11.7–15.4)
WBC: 5.3 10*3/uL (ref 3.4–10.8)

## 2019-09-18 LAB — BASIC METABOLIC PANEL
BUN/Creatinine Ratio: 9 — ABNORMAL LOW (ref 12–28)
BUN: 8 mg/dL (ref 8–27)
CO2: 26 mmol/L (ref 20–29)
Calcium: 9.2 mg/dL (ref 8.7–10.3)
Chloride: 103 mmol/L (ref 96–106)
Creatinine, Ser: 0.89 mg/dL (ref 0.57–1.00)
GFR calc Af Amer: 81 mL/min/{1.73_m2} (ref >59)
GFR calc non Af Amer: 70 mL/min/{1.73_m2} (ref >59)
Glucose: 107 mg/dL — ABNORMAL HIGH (ref 65–99)
Potassium: 3.2 mmol/L — ABNORMAL LOW (ref 3.5–5.2)
Sodium: 144 mmol/L (ref 134–144)

## 2019-09-18 MED ORDER — POTASSIUM CHLORIDE CRYS ER 10 MEQ PO TBCR: 10.0000 meq | EXTENDED_RELEASE_TABLET | Freq: Two times a day (BID) | ORAL | 3 refills | Status: DC

## 2019-09-18 MED FILL — POTASSIUM CHL ER M10 TABLET: 10 | 90 days supply | Qty: 180 | Fill #0

## 2019-09-18 NOTE — Telephone Encounter (Signed)
Spoke with pt, she reports she has been taking an OTC potassium tablet. New script sent to the pharmacy for potassium.

## 2019-09-20 DIAGNOSIS — L400 Psoriasis vulgaris: Secondary | ICD-10-CM | POA: Diagnosis not present

## 2019-09-20 DIAGNOSIS — Z79899 Other long term (current) drug therapy: Secondary | ICD-10-CM | POA: Diagnosis not present

## 2019-09-21 MED FILL — hydrOXYzine HCL 25 MG TABS: 25 | 30 days supply | Qty: 30 | Fill #0

## 2019-09-27 ENCOUNTER — Ambulatory Visit (HOSPITAL_COMMUNITY): Payer: 59 | Attending: Cardiology

## 2019-09-27 ENCOUNTER — Other Ambulatory Visit: Payer: Self-pay

## 2019-09-27 DIAGNOSIS — I480 Paroxysmal atrial fibrillation: Secondary | ICD-10-CM | POA: Insufficient documentation

## 2019-09-27 MED FILL — FLUTICASONE PROP 50 MCG SPR: 50 | 30 days supply | Qty: 16 | Fill #2

## 2019-09-27 MED FILL — TRULICITY 1.5 MG/0.5 ML PEN: 1.5 | 28 days supply | Qty: 2 | Fill #0

## 2019-10-08 DIAGNOSIS — H9313 Tinnitus, bilateral: Secondary | ICD-10-CM | POA: Diagnosis not present

## 2019-10-08 DIAGNOSIS — E119 Type 2 diabetes mellitus without complications: Secondary | ICD-10-CM | POA: Diagnosis not present

## 2019-10-08 MED FILL — SERTRALINE HCL 50 MG TABLET: 50 | 90 days supply | Qty: 90 | Fill #0

## 2019-10-10 MED FILL — METHOTREXATE SODIUM 2.5 MG: 2.5 | 28 days supply | Qty: 24 | Fill #1

## 2019-10-24 MED FILL — TRULICITY 1.5 MG/0.5 ML PEN: 1.5 | 28 days supply | Qty: 2 | Fill #0

## 2019-10-24 MED FILL — FOLIC ACID 1 MG TABS: 1 | 90 days supply | Qty: 90 | Fill #1

## 2019-10-25 ENCOUNTER — Other Ambulatory Visit: Payer: Self-pay | Admitting: Cardiology

## 2019-10-26 MED FILL — FLECAINIDE ACETATE 50 MG TA: 50 | 90 days supply | Qty: 180 | Fill #0

## 2019-10-30 ENCOUNTER — Other Ambulatory Visit: Payer: Self-pay | Admitting: Adult Health

## 2019-10-30 DIAGNOSIS — I480 Paroxysmal atrial fibrillation: Secondary | ICD-10-CM

## 2019-10-31 MED FILL — XARELTO 20 MG TABLET: 20 | 90 days supply | Qty: 90 | Fill #0

## 2019-11-07 MED FILL — METHOTREXATE SODIUM 2.5 MG: 2.5 | 24 days supply | Qty: 24 | Fill #0

## 2019-11-23 MED FILL — TRULICITY 1.5 MG/0.5 ML PEN: 1.5 | 28 days supply | Qty: 2 | Fill #0

## 2019-11-27 ENCOUNTER — Telehealth: Payer: 59 | Admitting: Nurse Practitioner

## 2019-11-27 DIAGNOSIS — L03116 Cellulitis of left lower limb: Secondary | ICD-10-CM

## 2019-11-27 MED ORDER — CEPHALEXIN 500 MG PO CAPS: 500.0000 mg | ORAL_CAPSULE | Freq: Four times a day (QID) | ORAL | 0 refills | Status: DC

## 2019-11-27 MED ORDER — FLUCONAZOLE 150 MG PO TABS: 150.0000 mg | ORAL_TABLET | Freq: Once | ORAL | 0 refills | Status: AC

## 2019-11-27 NOTE — Progress Notes (Signed)
E Visit for Cellulitis  We are sorry that you are not feeling well. Here is how we plan to help!  Based on what you shared with me it looks like you have cellulitis.  Cellulitis looks like areas of skin redness, swelling, and warmth; it develops as a result of bacteria entering under the skin. Little red spots and/or bleeding can be seen in skin, and tiny surface sacs containing fluid can occur. Fever can be present. Cellulitis is almost always on one side of a body, and the lower limbs are the most common site of involvement.   I have prescribed:  Keflex 500mg  take one by mouth four times a day for 10 days. Since this has reoccurred several times you really need to see your orthopedic surgeon to have some test run. This could become very serious.  HOME CARE:  . Take your medications as ordered and take all of them, even if the skin irritation appears to be healing.   GET HELP RIGHT AWAY IF:  . Symptoms that don't begin to go away within 48 hours. . Severe redness persists or worsens . If the area turns color, spreads or swells. . If it blisters and opens, develops yellow-brown crust or bleeds. . You develop a fever or chills. . If the pain increases or becomes unbearable.  . Are unable to keep fluids and food down.  MAKE SURE YOU    Understand these instructions.  Will watch your condition.  Will get help right away if you are not doing well or get worse.  Thank you for choosing an e-visit. Your e-visit answers were reviewed by a board certified advanced clinical practitioner to complete your personal care plan. Depending upon the condition, your plan could have included both over the counter or prescription medications. Please review your pharmacy choice. Make sure the pharmacy is open so you can pick up prescription now. If there is a problem, you may contact your provider through [employer] and have the prescription routed to another pharmacy. Your safety is important to  [place]. If you have drug allergies check your prescription carefully.  For the next 24 hours you can use MyChart to ask questions about today's visit, request a non-urgent call back, or ask for a work or school excuse. You will get an email in the next two days asking about your experience. I hope that your e-visit has been valuable and will speed your recovery.  5-10 minutes spent reviewing and documenting in chart.

## 2019-11-27 NOTE — Addendum Note (Signed)
Addended by: Chevis Pretty on: 11/27/2019 07:49 PM   Modules accepted: Orders

## 2019-11-28 ENCOUNTER — Telehealth: Payer: Self-pay | Admitting: Orthopaedic Surgery

## 2019-11-28 MED ORDER — CEPHALEXIN 500 MG PO CAPS: 500.0000 mg | ORAL_CAPSULE | Freq: Four times a day (QID) | ORAL | 0 refills | Status: DC

## 2019-11-28 MED FILL — CEPHALEXIN 500 MG CAPSULE: 500 | 10 days supply | Qty: 40 | Fill #0

## 2019-11-28 MED FILL — FLUCONAZOLE 150 MG TABS: 150 | 1 days supply | Qty: 1 | Fill #0

## 2019-11-28 NOTE — Telephone Encounter (Signed)
I am sending this to you for her asking for antibiotic I will call her about the MRI, looks like they called several times, but she never answered or called back

## 2019-11-28 NOTE — Telephone Encounter (Signed)
I did send in the Keflex.  I would still recommend a MRI of that hip.

## 2019-11-28 NOTE — Telephone Encounter (Signed)
Patient called advised she was never scheduled for an MRI last year and have developed cellulitis for the 4th time on her left hip. Patient said she has been in the office for the same thing over and over and do not want to make another office visit appointment. Patient asked if she can get an antibiotic [facility] and get set up for the MRI? Patient said she was e scribed Keflex and that is to strong.The number to contact patient is [phone]

## 2019-11-29 NOTE — Telephone Encounter (Signed)
IC pt left vm stating she can call [facility] to get rescheduled and the order is good for 27yr. Pt insurnace did not require PA.

## 2019-11-29 NOTE — Telephone Encounter (Signed)
Can we "re-order/re-authorize" the MRI we ordered back in August?

## 2019-12-04 ENCOUNTER — Telehealth: Payer: Self-pay | Admitting: Orthopaedic Surgery

## 2019-12-04 NOTE — Telephone Encounter (Signed)
Patient called advised she is having her MRI Saturday and need something before her appointment to take. Patient said she is claustrophobic. Patient said she uses the [facility]. The number to contact patient is [phone]

## 2019-12-05 ENCOUNTER — Other Ambulatory Visit: Payer: Self-pay

## 2019-12-05 MED ORDER — DIAZEPAM 5 MG PO TABS: ORAL_TABLET | ORAL | 0 refills | Status: DC

## 2019-12-05 MED FILL — diazePAM 5 MG TABS: 5 | 1 days supply | Qty: 2 | Fill #0

## 2019-12-05 NOTE — Telephone Encounter (Signed)
LMOm for patient letting her know I called in Rx for her

## 2019-12-06 ENCOUNTER — Telehealth: Payer: Self-pay | Admitting: Orthopaedic Surgery

## 2019-12-06 NOTE — Telephone Encounter (Signed)
LM on voicemail for patient to call the office to schedule her MRI Review with Dr. [name].

## 2019-12-08 ENCOUNTER — Ambulatory Visit (HOSPITAL_BASED_OUTPATIENT_CLINIC_OR_DEPARTMENT_OTHER)
Admission: RE | Admit: 2019-12-08 | Discharge: 2019-12-08 | Disposition: A | Discharge status: Home / Self Care | Payer: 59 | Source: Ambulatory Visit | Attending: Orthopaedic Surgery | Admitting: Orthopaedic Surgery

## 2019-12-08 ENCOUNTER — Other Ambulatory Visit: Payer: Self-pay

## 2019-12-08 DIAGNOSIS — Z96642 Presence of left artificial hip joint: Secondary | ICD-10-CM | POA: Diagnosis not present

## 2019-12-11 MED FILL — HYDROCHLOROTHIAZIDE 25 MG T: 25 | 90 days supply | Qty: 90 | Fill #3

## 2019-12-12 MED FILL — METHOTREXATE 2.5 MG TAB: 2.5 | 24 days supply | Qty: 24 | Fill #1

## 2019-12-17 ENCOUNTER — Ambulatory Visit: Payer: 59 | Admitting: Orthopaedic Surgery

## 2019-12-21 DIAGNOSIS — E119 Type 2 diabetes mellitus without complications: Secondary | ICD-10-CM | POA: Diagnosis not present

## 2019-12-26 ENCOUNTER — Other Ambulatory Visit: Payer: Self-pay

## 2019-12-26 ENCOUNTER — Ambulatory Visit (INDEPENDENT_AMBULATORY_CARE_PROVIDER_SITE_OTHER): Payer: 59 | Admitting: Orthopaedic Surgery

## 2019-12-26 ENCOUNTER — Encounter: Payer: Self-pay | Admitting: Orthopaedic Surgery

## 2019-12-26 DIAGNOSIS — Z96642 Presence of left artificial hip joint: Secondary | ICD-10-CM

## 2019-12-26 NOTE — Progress Notes (Signed)
The patient is well-known to me.  She is following up after we obtain an MRI of her pelvis and especially left hip.  She has a history of bilateral hip arthroplasties.  The right hip was done several years ago by one of my colleagues in town.  This was done through a posterior approach and has had no issues.  I replaced her left hip in August 2019.  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  This does cause hip pain.  The cellulitis resolves after being on Keflex.  She is someone who is on chronic methotrexate.  This certainly can decrease her immune response.  She also has a history of psoriasis.  She has had recent x-rays of her hip on the left side and the x-rays are normal.  I sent her for a MRI of her left hip to assess the implant and look for any evidence of fluid collections or other abnormalities around the soft tissues to suggest any deep chronic infection or any issues as a relates to the implant itself.  She is walking today without assistive device.  Her most recent bout of cellulitis is resolved completely.  I can easily put her right hip and left hip through full motion.  There is a little more weakness in the left hip musculature than the right side.  The MRI is reviewed with her.  There is no abnormalities seen around either hip.  There is no fluid collections.  There is no evidence to suggest loosening or infection or metallosis of either hip.  There is no atrophy of the muscles or fluid in the soft tissue planes.  There is no evidence of cellulitis.  I am still at a loss of what is driving her bouts of cellulitis.  I did let her know the next time she does have this occur I would like to see her in the office and then order her white blood cell count, a sed rate and a CRP.  There is no fluid around her hip to aspirate.  Certainly this could be some type of metal allergy but she does have metal implant on her other side  as well.  I would like to send her to an allergist with [facility] to see if there is a possibility that she is having some other type of allergic response either the implants or medications that she is on.  She agrees with this treatment plan.  I would like to see her back in about 4 weeks to see how she is doing overall.

## 2019-12-28 ENCOUNTER — Other Ambulatory Visit: Payer: Self-pay

## 2019-12-28 DIAGNOSIS — Z96642 Presence of left artificial hip joint: Secondary | ICD-10-CM

## 2020-01-08 ENCOUNTER — Other Ambulatory Visit: Payer: Self-pay

## 2020-01-08 DIAGNOSIS — Z96643 Presence of artificial hip joint, bilateral: Secondary | ICD-10-CM

## 2020-01-08 MED FILL — SERTRALINE HCL 50 MG TABS: 50 | 90 days supply | Qty: 90 | Fill #1

## 2020-01-11 MED FILL — FOLIC ACID 1 MG TABS: 1 | 90 days supply | Qty: 90 | Fill #2

## 2020-01-15 MED FILL — METHOTREXATE SODIUM 2.5 MG: 2.5 | 28 days supply | Qty: 24 | Fill #0

## 2020-01-28 DIAGNOSIS — L400 Psoriasis vulgaris: Secondary | ICD-10-CM | POA: Diagnosis not present

## 2020-01-28 DIAGNOSIS — Z79899 Other long term (current) drug therapy: Secondary | ICD-10-CM | POA: Diagnosis not present

## 2020-01-30 MED FILL — XARELTO 20 MG TABLET: 20 | 90 days supply | Qty: 90 | Fill #1

## 2020-01-30 MED FILL — FLECAINIDE ACETATE 50 MG TA: 50 | 90 days supply | Qty: 180 | Fill #1

## 2020-02-04 ENCOUNTER — Ambulatory Visit (INDEPENDENT_AMBULATORY_CARE_PROVIDER_SITE_OTHER): Payer: 59 | Admitting: Orthopaedic Surgery

## 2020-02-04 ENCOUNTER — Encounter: Payer: Self-pay | Admitting: Orthopaedic Surgery

## 2020-02-04 DIAGNOSIS — Z96642 Presence of left artificial hip joint: Secondary | ICD-10-CM

## 2020-02-04 NOTE — Progress Notes (Signed)
The patient has a history of a left hip replacement was done a few years ago.  She then occasionally gets cellulitis more in her flank and her posterior hip.  Not over the left hip incision.  Today she does not have cellulitis.  Her hip motion and gait is normal and her hip shows no evidence of cellulitis today.  My concern is whether or not she has some type of metal allergy.  When I have MRI her left hip before I did not show any acute findings at all around the hip and no fluid collections or evidence of infection or loosening.  She is asymptomatic today.  Occasionally she does have the cellulitis recur she gets on antibiotics and it clears it up again.  There is concerned that there may be a metal allergy so we have referred her to an allergist to get their opinion.  Somehow this referral has not been made or she has not gotten appointment so we will work on doing that again.

## 2020-02-15 ENCOUNTER — Other Ambulatory Visit (HOSPITAL_BASED_OUTPATIENT_CLINIC_OR_DEPARTMENT_OTHER): Payer: Self-pay | Admitting: Dermatology

## 2020-02-15 MED FILL — METHOTREXATE SODIUM 2.5 MG: 2.5 | 28 days supply | Qty: 24 | Fill #0

## 2020-02-15 MED FILL — BYSTOLIC 10 MG TABLET: 10 | 90 days supply | Qty: 90 | Fill #2

## 2020-03-14 ENCOUNTER — Other Ambulatory Visit (HOSPITAL_BASED_OUTPATIENT_CLINIC_OR_DEPARTMENT_OTHER): Payer: Self-pay | Admitting: Family Medicine

## 2020-03-14 MED FILL — METHOTREXATE 2.5 MG TAB: 2.5 | 28 days supply | Qty: 24 | Fill #1

## 2020-03-14 MED FILL — HYDROCHLOROTHIAZIDE 25 MG T: 25 | 30 days supply | Qty: 30 | Fill #0

## 2020-03-17 MED FILL — TRULICITY 1.5 MG/0.5 ML PEN: 1.5 | 28 days supply | Qty: 2 | Fill #2

## 2020-04-18 MED FILL — METHOTREXATE 2.5 MG TAB: 2.5 | 28 days supply | Qty: 24 | Fill #2

## 2020-04-18 MED FILL — FOLIC ACID 1 MG TABS: 1 | 90 days supply | Qty: 90 | Fill #0

## 2020-04-18 MED FILL — SERTRALINE HCL 50 MG TABLET: 50 | 30 days supply | Qty: 30 | Fill #0

## 2020-05-01 ENCOUNTER — Other Ambulatory Visit: Payer: Self-pay | Admitting: Adult Health

## 2020-05-01 DIAGNOSIS — I480 Paroxysmal atrial fibrillation: Secondary | ICD-10-CM

## 2020-05-02 ENCOUNTER — Other Ambulatory Visit: Payer: Self-pay | Admitting: Adult Health

## 2020-05-02 MED FILL — XARELTO 20 MG TABLET: 20 | 90 days supply | Qty: 90 | Fill #0

## 2020-05-02 MED FILL — HYDROCHLOROTHIAZIDE 25 MG T: 25 | 30 days supply | Qty: 30 | Fill #0

## 2020-05-02 NOTE — Telephone Encounter (Signed)
Prescription refill request for Xarelto received.  Indication:  Atrial Fibrillation Last office visit: 09/2019 [place] Weight:117 kg Age: 64 Scr: 0.89 09/2019 CrCl: 122.61 ml/min  Prescription refilled

## 2020-05-07 MED FILL — FLECAINIDE ACETATE 50 MG TA: 50 | 90 days supply | Qty: 180 | Fill #2

## 2020-05-08 MED FILL — TRULICITY 1.5 MG/0.5 ML PEN: 1.5 | 28 days supply | Qty: 2 | Fill #3

## 2020-05-09 ENCOUNTER — Ambulatory Visit: Payer: 59 | Attending: Internal Medicine

## 2020-05-09 DIAGNOSIS — Z23 Encounter for immunization: Secondary | ICD-10-CM

## 2020-05-09 NOTE — Progress Notes (Signed)
° °  Covid-19 Vaccination Clinic  Name:  Monica [name]    MRN: [id number] DOB: 1956/07/02  05/09/2020  Ms. Cadenas was observed post Covid-19 immunization for 15 minutes without incident. She was provided with Vaccine Information Sheet and instruction to access the V-Safe system.  Vaccinated by Barrington Ellison  Ms. Mealor was instructed to call 911 with any severe reactions post vaccine:  Difficulty breathing   Swelling of face and throat   A fast heartbeat   A bad rash all over body   Dizziness and weakness

## 2020-05-12 MED FILL — FLUTICASONE PROP 50 MCG SPR: 50 | 30 days supply | Qty: 16 | Fill #0

## 2020-05-16 MED FILL — METHOTREXATE 2.5 MG TAB: 2.5 | 28 days supply | Qty: 24 | Fill #3

## 2020-05-16 MED FILL — POTASSIUM CHL ER M10 TABLET: 10 | 90 days supply | Qty: 180 | Fill #1

## 2020-05-26 ENCOUNTER — Other Ambulatory Visit (HOSPITAL_BASED_OUTPATIENT_CLINIC_OR_DEPARTMENT_OTHER): Payer: Self-pay | Admitting: Family Medicine

## 2020-05-26 DIAGNOSIS — H9313 Tinnitus, bilateral: Secondary | ICD-10-CM | POA: Diagnosis not present

## 2020-05-26 DIAGNOSIS — R110 Nausea: Secondary | ICD-10-CM | POA: Diagnosis not present

## 2020-05-26 DIAGNOSIS — L309 Dermatitis, unspecified: Secondary | ICD-10-CM | POA: Diagnosis not present

## 2020-05-26 DIAGNOSIS — R5383 Other fatigue: Secondary | ICD-10-CM | POA: Diagnosis not present

## 2020-05-26 DIAGNOSIS — E785 Hyperlipidemia, unspecified: Secondary | ICD-10-CM | POA: Diagnosis not present

## 2020-05-26 DIAGNOSIS — E119 Type 2 diabetes mellitus without complications: Secondary | ICD-10-CM | POA: Diagnosis not present

## 2020-05-26 DIAGNOSIS — J309 Allergic rhinitis, unspecified: Secondary | ICD-10-CM | POA: Diagnosis not present

## 2020-05-26 DIAGNOSIS — I10 Essential (primary) hypertension: Secondary | ICD-10-CM | POA: Diagnosis not present

## 2020-05-26 MED FILL — TRULICITY 3 MG/0.5ML SOPN: 3 | 28 days supply | Qty: 2 | Fill #0

## 2020-05-26 MED FILL — ONDANSETRON ODT 8 MG TABLET: 8 | 10 days supply | Qty: 30 | Fill #0

## 2020-05-26 MED FILL — DESONIDE 0.05% CREAM: 0.05 | 30 days supply | Qty: 180 | Fill #0

## 2020-05-26 MED FILL — BYSTOLIC 10 MG TABLET: 10 | 90 days supply | Qty: 90 | Fill #0

## 2020-05-26 MED FILL — SERTRALINE HCL 50 MG TABLET: 50 | 90 days supply | Qty: 90 | Fill #0

## 2020-05-26 MED FILL — HYDROCHLOROTHIAZIDE 25 MG T: 25 | 90 days supply | Qty: 90 | Fill #0

## 2020-05-30 ENCOUNTER — Other Ambulatory Visit (HOSPITAL_BASED_OUTPATIENT_CLINIC_OR_DEPARTMENT_OTHER): Payer: Self-pay | Admitting: Dermatology

## 2020-05-30 DIAGNOSIS — L249 Irritant contact dermatitis, unspecified cause: Secondary | ICD-10-CM | POA: Diagnosis not present

## 2020-05-30 DIAGNOSIS — L400 Psoriasis vulgaris: Secondary | ICD-10-CM | POA: Diagnosis not present

## 2020-05-30 MED FILL — predniSONE 20 MG TABS: 20 | 3 days supply | Qty: 5 | Fill #0

## 2020-06-09 MED FILL — METHOTREXATE SODIUM 2.5 MG: 2.5 | 30 days supply | Qty: 24 | Fill #0

## 2020-06-19 MED FILL — DESONIDE 0.05% CREAM: 0.05 | 60 days supply | Qty: 60 | Fill #0

## 2020-06-19 MED FILL — TRULICITY 3 MG/0.5ML SOPN: 3 | 84 days supply | Qty: 6 | Fill #1

## 2020-07-08 MED FILL — METHOTREXATE SODIUM 2.5 MG: 2.5 | 30 days supply | Qty: 24 | Fill #1

## 2020-07-14 MED FILL — FOLIC ACID 1 MG TABS: 1 | 30 days supply | Qty: 30 | Fill #0

## 2020-08-01 MED FILL — XARELTO 20 MG TABLET: 20 | 90 days supply | Qty: 90 | Fill #1

## 2020-08-06 MED FILL — FLECAINIDE ACETATE 50 MG TA: 50 | 90 days supply | Qty: 180 | Fill #3

## 2020-08-06 MED FILL — METHOTREXATE SODIUM 2.5 MG: 2.5 | 30 days supply | Qty: 24 | Fill #2

## 2020-08-18 ENCOUNTER — Other Ambulatory Visit (HOSPITAL_BASED_OUTPATIENT_CLINIC_OR_DEPARTMENT_OTHER): Payer: Self-pay | Admitting: Dermatology

## 2020-08-18 MED FILL — FOLIC ACID 1 MG TABS: 1 | 90 days supply | Qty: 90 | Fill #0

## 2020-08-28 MED FILL — HYDROCHLOROTHIAZIDE 25 MG T: 25 | 90 days supply | Qty: 90 | Fill #1

## 2020-08-28 MED FILL — SERTRALINE HCL 50 MG TABLET: 50 | 90 days supply | Qty: 90 | Fill #1

## 2020-09-04 MED FILL — METHOTREXATE SODIUM 2.5 MG: 2.5 | 30 days supply | Qty: 24 | Fill #3

## 2020-10-03 ENCOUNTER — Other Ambulatory Visit (HOSPITAL_BASED_OUTPATIENT_CLINIC_OR_DEPARTMENT_OTHER): Payer: Self-pay | Admitting: Dermatology

## 2020-10-03 MED FILL — METHOTREXATE SODIUM 2.5 MG: 2.5 | 30 days supply | Qty: 24 | Fill #0

## 2020-10-10 ENCOUNTER — Other Ambulatory Visit (HOSPITAL_BASED_OUTPATIENT_CLINIC_OR_DEPARTMENT_OTHER): Payer: Self-pay | Admitting: Dermatology

## 2020-10-29 ENCOUNTER — Other Ambulatory Visit: Payer: Self-pay | Admitting: Cardiology

## 2020-10-29 DIAGNOSIS — E876 Hypokalemia: Secondary | ICD-10-CM

## 2020-11-03 ENCOUNTER — Other Ambulatory Visit: Payer: Self-pay | Admitting: Cardiology

## 2020-11-03 ENCOUNTER — Other Ambulatory Visit: Payer: Self-pay | Admitting: Adult Health

## 2020-11-03 DIAGNOSIS — I480 Paroxysmal atrial fibrillation: Secondary | ICD-10-CM

## 2020-11-03 MED FILL — XARELTO 20 MG TABLET: 20 | 90 days supply | Qty: 90 | Fill #0

## 2020-11-03 NOTE — Telephone Encounter (Signed)
66f, 117kg, scr 0.83 05/26/20, ccr 114ml/min lovw/crenshaw 09/17/19

## 2020-11-12 ENCOUNTER — Other Ambulatory Visit: Payer: Self-pay | Admitting: Cardiology

## 2020-11-12 MED FILL — FLECAINIDE ACETATE 50 MG TA: 50 | 90 days supply | Qty: 180 | Fill #0

## 2020-11-12 MED FILL — TRULICITY 3 MG/0.5ML SOPN: 3 | 56 days supply | Qty: 4 | Fill #2

## 2020-11-26 ENCOUNTER — Other Ambulatory Visit (HOSPITAL_BASED_OUTPATIENT_CLINIC_OR_DEPARTMENT_OTHER): Payer: Self-pay

## 2020-11-26 DIAGNOSIS — Z79899 Other long term (current) drug therapy: Secondary | ICD-10-CM | POA: Diagnosis not present

## 2020-11-26 DIAGNOSIS — L400 Psoriasis vulgaris: Secondary | ICD-10-CM | POA: Diagnosis not present

## 2020-11-26 MED ORDER — METHOTREXATE 2.5 MG PO TABS
ORAL_TABLET | ORAL | 3 refills | Status: DC
  Filled 2020-11-26: qty 24, 28d supply, fill #0
  Filled 2021-01-21: qty 24, 28d supply, fill #1
  Filled 2021-02-20: qty 24, 28d supply, fill #2
  Filled 2021-03-18: qty 24, 28d supply, fill #3

## 2020-11-26 MED ORDER — FOLIC ACID 1 MG PO TABS
ORAL_TABLET | ORAL | 3 refills | Status: DC
  Filled 2020-11-26: qty 60, 30d supply, fill #0
  Filled 2021-01-21: qty 60, 30d supply, fill #1
  Filled 2021-04-13: qty 60, 30d supply, fill #2
  Filled 2021-10-14: qty 60, 30d supply, fill #3

## 2020-12-08 ENCOUNTER — Other Ambulatory Visit (HOSPITAL_BASED_OUTPATIENT_CLINIC_OR_DEPARTMENT_OTHER): Payer: Self-pay | Admitting: Family Medicine

## 2020-12-08 ENCOUNTER — Other Ambulatory Visit (HOSPITAL_BASED_OUTPATIENT_CLINIC_OR_DEPARTMENT_OTHER): Payer: Self-pay

## 2020-12-08 MED ORDER — SERTRALINE HCL 50 MG PO TABS
ORAL_TABLET | ORAL | 0 refills | Status: DC
  Filled 2020-12-08: qty 30, 30d supply, fill #0

## 2020-12-08 MED FILL — Hydrochlorothiazide Tab 25 MG: ORAL | 90 days supply | Qty: 90 | Fill #0 | Status: AC

## 2020-12-25 ENCOUNTER — Other Ambulatory Visit (HOSPITAL_BASED_OUTPATIENT_CLINIC_OR_DEPARTMENT_OTHER): Payer: Self-pay

## 2020-12-25 MED FILL — Methotrexate Sodium Tab 2.5 MG (Base Equiv): ORAL | 30 days supply | Qty: 24 | Fill #0 | Status: AC

## 2021-01-21 ENCOUNTER — Other Ambulatory Visit (HOSPITAL_BASED_OUTPATIENT_CLINIC_OR_DEPARTMENT_OTHER): Payer: Self-pay

## 2021-01-21 MED ORDER — TRULICITY 3 MG/0.5ML ~~LOC~~ SOAJ
SUBCUTANEOUS | 0 refills | Status: DC
  Filled 2021-01-21: qty 2, 28d supply, fill #0

## 2021-01-22 ENCOUNTER — Other Ambulatory Visit (HOSPITAL_BASED_OUTPATIENT_CLINIC_OR_DEPARTMENT_OTHER): Payer: Self-pay

## 2021-01-23 ENCOUNTER — Other Ambulatory Visit (HOSPITAL_BASED_OUTPATIENT_CLINIC_OR_DEPARTMENT_OTHER): Payer: Self-pay

## 2021-02-03 ENCOUNTER — Other Ambulatory Visit (HOSPITAL_COMMUNITY): Payer: Self-pay

## 2021-02-06 ENCOUNTER — Ambulatory Visit: Payer: 59 | Attending: Internal Medicine

## 2021-02-06 ENCOUNTER — Other Ambulatory Visit (HOSPITAL_BASED_OUTPATIENT_CLINIC_OR_DEPARTMENT_OTHER): Payer: Self-pay

## 2021-02-06 DIAGNOSIS — Z23 Encounter for immunization: Secondary | ICD-10-CM

## 2021-02-06 MED FILL — Rivaroxaban Tab 20 MG: ORAL | 90 days supply | Qty: 90 | Fill #0 | Status: AC

## 2021-02-06 MED FILL — Nebivolol HCl Tab 10 MG (Base Equivalent): ORAL | 90 days supply | Qty: 90 | Fill #0 | Status: AC

## 2021-02-06 NOTE — Progress Notes (Signed)
   Covid-19 Vaccination Clinic  Name:  Monica [name]    MRN: [id number] DOB: 1955/08/17  02/06/2021  [name] was observed post Covid-19 immunization for 15 minutes without incident. She was provided with Vaccine Information Sheet and instruction to access the V-Safe system.   [name] was instructed to call 911 with any severe reactions post vaccine: Difficulty breathing  Swelling of face and throat  A fast heartbeat  A bad rash all over body  Dizziness and weakness   Immunizations Administered     Name Date Dose VIS Date Route   PFIZER Comrnaty(Gray TOP) Covid-19 Vaccine 02/06/2021  3:44 PM 0.3 mL 07/24/2020 Intramuscular   Manufacturer: [place]   Lot: [id number]   [place]: [phone]

## 2021-02-09 ENCOUNTER — Other Ambulatory Visit (HOSPITAL_BASED_OUTPATIENT_CLINIC_OR_DEPARTMENT_OTHER): Payer: Self-pay

## 2021-02-09 MED ORDER — COVID-19 MRNA VAC-TRIS(PFIZER) 30 MCG/0.3ML IM SUSP
INTRAMUSCULAR | 0 refills | Status: DC
  Filled 2021-02-09: qty 0.3, 1d supply, fill #0

## 2021-02-11 ENCOUNTER — Other Ambulatory Visit (HOSPITAL_BASED_OUTPATIENT_CLINIC_OR_DEPARTMENT_OTHER): Payer: Self-pay

## 2021-02-11 DIAGNOSIS — E119 Type 2 diabetes mellitus without complications: Secondary | ICD-10-CM | POA: Diagnosis not present

## 2021-02-11 DIAGNOSIS — J309 Allergic rhinitis, unspecified: Secondary | ICD-10-CM | POA: Diagnosis not present

## 2021-02-11 DIAGNOSIS — H9313 Tinnitus, bilateral: Secondary | ICD-10-CM | POA: Diagnosis not present

## 2021-02-11 DIAGNOSIS — I10 Essential (primary) hypertension: Secondary | ICD-10-CM | POA: Diagnosis not present

## 2021-02-11 DIAGNOSIS — E876 Hypokalemia: Secondary | ICD-10-CM | POA: Diagnosis not present

## 2021-02-11 MED ORDER — POTASSIUM CHLORIDE ER 10 MEQ PO TBCR
EXTENDED_RELEASE_TABLET | ORAL | 3 refills | Status: DC
  Filled 2021-02-11: qty 360, 90d supply, fill #0
  Filled 2021-05-08: qty 120, 30d supply, fill #0

## 2021-02-11 MED ORDER — TRULICITY 3 MG/0.5ML ~~LOC~~ SOAJ
SUBCUTANEOUS | 5 refills | Status: DC
  Filled 2021-02-11: qty 2, 28d supply, fill #0
  Filled 2021-03-18: qty 2, 28d supply, fill #1
  Filled 2021-04-13: qty 2, 28d supply, fill #2
  Filled 2021-05-08: qty 2, 28d supply, fill #3
  Filled 2021-06-18: qty 2, 28d supply, fill #4
  Filled 2021-08-13: qty 2, 28d supply, fill #5

## 2021-02-11 MED ORDER — SERTRALINE HCL 50 MG PO TABS
ORAL_TABLET | ORAL | 1 refills | Status: DC
  Filled 2021-02-11: qty 90, 90d supply, fill #0

## 2021-02-11 MED ORDER — NEBIVOLOL HCL 10 MG PO TABS: 10.0000 mg | ORAL_TABLET | Freq: Every day | ORAL | 3 refills | Status: DC

## 2021-02-11 MED ORDER — FLUTICASONE PROPIONATE 50 MCG/ACT NA SUSP
NASAL | 3 refills | Status: DC
  Filled 2021-02-11: qty 16, 30d supply, fill #0

## 2021-02-11 MED ORDER — HYDROCHLOROTHIAZIDE 25 MG PO TABS
25.0000 mg | ORAL_TABLET | Freq: Every morning | ORAL | 3 refills | Status: DC
  Filled 2021-02-11: qty 90, 90d supply, fill #0

## 2021-02-12 ENCOUNTER — Other Ambulatory Visit (HOSPITAL_BASED_OUTPATIENT_CLINIC_OR_DEPARTMENT_OTHER): Payer: Self-pay

## 2021-02-20 ENCOUNTER — Other Ambulatory Visit (HOSPITAL_BASED_OUTPATIENT_CLINIC_OR_DEPARTMENT_OTHER): Payer: Self-pay

## 2021-02-23 DIAGNOSIS — L401 Generalized pustular psoriasis: Secondary | ICD-10-CM | POA: Diagnosis not present

## 2021-02-23 DIAGNOSIS — Z79899 Other long term (current) drug therapy: Secondary | ICD-10-CM | POA: Diagnosis not present

## 2021-03-09 MED FILL — Flecainide Acetate Tab 50 MG: ORAL | 90 days supply | Qty: 180 | Fill #0 | Status: AC

## 2021-03-10 ENCOUNTER — Other Ambulatory Visit (HOSPITAL_BASED_OUTPATIENT_CLINIC_OR_DEPARTMENT_OTHER): Payer: Self-pay

## 2021-03-11 ENCOUNTER — Other Ambulatory Visit (HOSPITAL_BASED_OUTPATIENT_CLINIC_OR_DEPARTMENT_OTHER): Payer: Self-pay

## 2021-03-18 ENCOUNTER — Other Ambulatory Visit (HOSPITAL_BASED_OUTPATIENT_CLINIC_OR_DEPARTMENT_OTHER): Payer: Self-pay

## 2021-03-18 MED FILL — Hydrochlorothiazide Tab 25 MG: ORAL | 90 days supply | Qty: 90 | Fill #1 | Status: AC

## 2021-03-19 ENCOUNTER — Other Ambulatory Visit (HOSPITAL_BASED_OUTPATIENT_CLINIC_OR_DEPARTMENT_OTHER): Payer: Self-pay

## 2021-03-19 MED ORDER — HYDROCODONE-ACETAMINOPHEN 10-325 MG PO TABS
ORAL_TABLET | ORAL | 0 refills | Status: DC
  Filled 2021-03-19: qty 20, 5d supply, fill #0

## 2021-03-27 ENCOUNTER — Other Ambulatory Visit (HOSPITAL_COMMUNITY): Payer: Self-pay

## 2021-04-13 ENCOUNTER — Other Ambulatory Visit (HOSPITAL_BASED_OUTPATIENT_CLINIC_OR_DEPARTMENT_OTHER): Payer: Self-pay

## 2021-04-16 ENCOUNTER — Other Ambulatory Visit (HOSPITAL_BASED_OUTPATIENT_CLINIC_OR_DEPARTMENT_OTHER): Payer: Self-pay

## 2021-04-16 MED FILL — Methotrexate Sodium Tab 2.5 MG (Base Equiv): ORAL | 28 days supply | Qty: 24 | Fill #0 | Status: AC

## 2021-05-01 ENCOUNTER — Telehealth: Payer: Self-pay | Admitting: Cardiology

## 2021-05-01 NOTE — Telephone Encounter (Signed)
Spoke with pt. She report for the past couple of weeks she's been lightheaded when she stand. She state symptoms generally will go away quickly, but now starting to happen more often. She denies feeling like she is going to black out but state she would like to schedule an appointment for further evaluations. Pt unable to provide BP/ HR reading.  Appointment scheduled for 9/23. Pt instructed to change positions slowly in the meantime and report to ER if she feels like she is going to pass out. Pt verbalized understanding .

## 2021-05-01 NOTE — Telephone Encounter (Signed)
STAT if patient feels like he/she is going to faint   Are you dizzy now? no  Do you feel faint or have you passed out? no  Do you have any other symptoms? no  Have you checked your HR and BP (record if available)? no

## 2021-05-08 ENCOUNTER — Other Ambulatory Visit: Payer: Self-pay | Admitting: Cardiology

## 2021-05-08 ENCOUNTER — Other Ambulatory Visit (HOSPITAL_BASED_OUTPATIENT_CLINIC_OR_DEPARTMENT_OTHER): Payer: Self-pay

## 2021-05-08 ENCOUNTER — Encounter (HOSPITAL_BASED_OUTPATIENT_CLINIC_OR_DEPARTMENT_OTHER): Payer: Self-pay | Admitting: Family

## 2021-05-08 ENCOUNTER — Other Ambulatory Visit: Payer: Self-pay

## 2021-05-08 ENCOUNTER — Ambulatory Visit (INDEPENDENT_AMBULATORY_CARE_PROVIDER_SITE_OTHER): Payer: 59 | Admitting: Family

## 2021-05-08 VITALS — HR 71 | Ht 65.0 in | Wt 238.6 lb

## 2021-05-08 DIAGNOSIS — I480 Paroxysmal atrial fibrillation: Secondary | ICD-10-CM

## 2021-05-08 DIAGNOSIS — I493 Ventricular premature depolarization: Secondary | ICD-10-CM

## 2021-05-08 DIAGNOSIS — I10 Essential (primary) hypertension: Secondary | ICD-10-CM

## 2021-05-08 DIAGNOSIS — D6869 Other thrombophilia: Secondary | ICD-10-CM

## 2021-05-08 DIAGNOSIS — I951 Orthostatic hypotension: Secondary | ICD-10-CM | POA: Diagnosis not present

## 2021-05-08 DIAGNOSIS — E1165 Type 2 diabetes mellitus with hyperglycemia: Secondary | ICD-10-CM

## 2021-05-08 DIAGNOSIS — R42 Dizziness and giddiness: Secondary | ICD-10-CM

## 2021-05-08 DIAGNOSIS — Z7901 Long term (current) use of anticoagulants: Secondary | ICD-10-CM | POA: Diagnosis not present

## 2021-05-08 DIAGNOSIS — I4891 Unspecified atrial fibrillation: Secondary | ICD-10-CM

## 2021-05-08 MED ORDER — BLOOD PRESSURE MONITOR MISC
1.0000 [IU] | Freq: Every day | 0 refills | Status: DC
  Filled 2021-05-08: qty 1, 30d supply, fill #0

## 2021-05-08 MED ORDER — RIVAROXABAN 20 MG PO TABS
ORAL_TABLET | Freq: Every day | ORAL | 1 refills | Status: DC
  Filled 2021-05-08: qty 90, 90d supply, fill #0
  Filled 2021-08-12: qty 90, 90d supply, fill #1

## 2021-05-08 NOTE — Progress Notes (Signed)
Office Visit    Patient Name: Monica [name] Date of Encounter: 05/08/2021  PCP:  [name], MD   [facility]  Cardiologist:  Kirk Ruths, MD  Advanced Practice Provider:  No care team member to display Electrophysiologist:  None    Chief Complaint    Monica [name] is a 65 y.o. female with a hx of PAF requiring DCCV 12/2012 and repeat cardioversion on Flecainide, mild to moderate MR, moderate TR, pulmonary hypertension, obesity, HTN, edema presents today for lightheadedness   Past Medical History    Past Medical History:  Diagnosis Date   Atrial fibrillation ([place])    a. s/p DCCV in 12/2012 b. repeat DCCV in 09/2014 - started on Flecainide   Avascular necrosis of bone of right hip ([place])    Dysrhythmia    a fib   Hypertension    Morbid obesity ([place])    Osteoarthritis    Personal history of colonic polyps - large hyperplastic 12/25/2013   Pre-diabetes    Psoriasis    active breakout left buttocks   Right hip pain    Shortness of breath    Sleep apnea    uses mouth guard only   Tachycardia, unspecified    Past Surgical History:  Procedure Laterality Date   CARDIOVERSION N/A 01/09/2013   Procedure: CARDIOVERSION;  Surgeon: [name], MD;  Location: [facility];  Service: Cardiovascular;  Laterality: N/A;   CARDIOVERSION N/A 10/07/2014   Procedure: CARDIOVERSION;  Surgeon: [name], MD;  Location: [facility];  Service: Cardiovascular;  Laterality: N/A;  09:00 Lido 60mg ,IV followed by Propofol  70mg /IV    synched electrocardioversion at 120 joules for Afib,repeated at 200 joules, 70 mg...successfully changed to [place]  07/21/12   right hip arthroplasty   JOINT REPLACEMENT     Left hip Dr. [name] 04-07-18   TOTAL HIP ARTHROPLASTY  07/21/2012   Procedure: TOTAL HIP ARTHROPLASTY;  Surgeon: [name], MD;  Location: [facility];  Service: Orthopedics;  Laterality: Right;   TOTAL HIP ARTHROPLASTY Left 04/07/2018    Procedure: LEFT TOTAL HIP ARTHROPLASTY ANTERIOR APPROACH;  Surgeon: [name], MD;  Location: [facility];  Service: Orthopedics;  Laterality: Left;  Needs RNFA    Allergies  No Known Allergies  History of Present Illness    Monica [name] is a 65 y.o. female with a hx of PAF requiring DCCV 12/2012 and repeat cardioversion on Flecainide, mild to moderate MR, moderate TR, pulmonary hypertension, obesity, HTN, edema last seen 09/17/19 by Dr. Stanford Breed.  Last seen 09/2019 by Dr. Stanford Breed. Echocardiogram for monitoring showed normal LVEF, mild LVH and LVE, mild MR, severe LAE, mild TR, mild pulmonary hypertension.   She presents today for follow up after calling the office noting lightheadedness. 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 Tells me her lightheadedness is happening about every other day. Most often noted with position changes. Yesterday she noted it at night. Had not yet taken her medicine nor eaten anything . She was worried if her heart valves or blood pressure needed to be checked. No near syncope nor syncope. No chest pain, pressure. Reports very rare tightness with stress. She usually does a protein shake and another meal during the day. She drinks Pepsi and Ginger Ale throughout the day. She drinks a little bit of water. Tells me she "can't stand compressoin socks in the summers" and her LE  edema is stable at baseline.  Orthostatic VS for the past 24 hrs (Last 3 readings):  BP- Lying Pulse- Lying BP- Sitting Pulse- Sitting BP- Standing at 0 minutes Pulse- Standing at 0 minutes BP- Standing at 3 minutes Pulse- Standing at 3 minutes  05/08/21 1144 120/57 74 127/48 74 115/57 (!) 40 123/82 74    *NOTE: HR of 40 on automatic cuff likely mis-read due to PVC  EKGs/Labs/Other Studies Reviewed:   The following studies were reviewed today:  Echo 09/27/19  1. Normal LV systolic function; mild LVH and LVE; mild MR; severe LAE;  mild TR; mild pulmonary  hypertension.   2. Left ventricular ejection fraction, by estimation, is 55 to 60%. The  left ventricle has normal function. The left ventrical has no regional  wall motion abnormalities. The left ventricular internal cavity size was  mildly dilated. There is mildly  increased left ventricular hypertrophy. Left ventricular diastolic  parameters are indeterminate.   3. Right ventricular systolic function is normal. The right ventricular  size is normal. There is mildly elevated pulmonary artery systolic  pressure.   4. Left atrial size was severely dilated.   5. The mitral valve is normal in structure and function. Mild mitral  valve regurgitation. No evidence of mitral stenosis.   6. The aortic valve is tricuspid. Aortic valve regurgitation is not  visualized. No aortic stenosis is present.   7. The inferior vena cava is normal in size with greater than 50%  respiratory variability, suggesting right atrial pressure of 3 mmHg.   EKG:  EKG is ordered today.  The ekg ordered today demonstrates NSR 71 bpm with occasional PVC.   Recent Labs: No results found for requested labs within last 8760 hours.  Recent Lipid Panel    Component Value Date/Time   CHOL 149 07/24/2013 0922   TRIG 101 07/24/2013 0922   HDL 42 07/24/2013 0922   CHOLHDL 3.5 07/24/2013 0922   VLDL 20 07/24/2013 0922   LDLCALC 87 07/24/2013 0922    Risk Assessment/Calculations:   CHA2DS2-VASc Score = 2   This indicates a 2.2% annual risk of stroke. The patient's score is based upon: CHF History: 0 HTN History: 1 Diabetes History: 0 Stroke History: 0 Vascular Disease History: 0 Age Score: 0 Gender Score: 1     Home Medications   Current Meds  Medication Sig   acetaminophen (TYLENOL) 650 MG CR tablet Take 650-1,300 mg by mouth every 8 (eight) hours as needed for pain.   BYSTOLIC 10 MG tablet Take 5 mg by mouth every evening.    calcium carbonate (TUMS EX) 750 MG chewable tablet Chew 1-3 tablets by mouth 3  (three) times daily as needed for heartburn.   Cholecalciferol (VITAMIN D3) 2000 units TABS Take 2,000 Units by mouth every evening.   clobetasol cream (TEMOVATE) 9.02 % Apply 1 application topically 2 (two) times daily as needed (for psoriasis).   COVID-19 mRNA Vac-TriS, Pfizer, SUSP injection Inject into the muscle.   desonide (DESOWEN) 0.05 % cream Apply 1 application topically 2 (two) times daily as needed (for psoriasis).    diazepam (VALIUM) 5 MG tablet Take one by mouth one hour prior to MRI, repeat as needed   diclofenac sodium (VOLTAREN) 1 % GEL Apply 4 g topically 4 (four) times daily. (Patient taking differently: Apply 4 g topically 4 (four) times daily as needed (pain).)   Dulaglutide (TRULICITY) 3 IO/9.7DZ SOPN Inject 0.5 mLs (3 mg total) into the skin every 7 days.  flecainide (TAMBOCOR) 50 MG tablet TAKE 1 TABLET BY MOUTH TWICE DAILY **NEEDS OFFICE VISIT FOR FURTHER REFILLS**   fluticasone (FLONASE) 50 MCG/ACT nasal spray PLACE 2 SPRAYS IN EACH NOSTRIL EVERYDAY   folic acid (FOLVITE) 1 MG tablet TAKE 2 TABLETS BY MOUTH DAILY   hydrochlorothiazide (HYDRODIURIL) 25 MG tablet TAKE 1 TABLET BY MOUTH EVERY MORNING   methotrexate (RHEUMATREX) 2.5 MG tablet TAKE 6 TABLETS BY MOUTH WEEKLY   Multiple Vitamin (MULTIVITAMIN WITH MINERALS) TABS tablet Take 1 tablet by mouth daily.   nebivolol (BYSTOLIC) 10 MG tablet TAKE 1 TABLET (10 MG TOTAL) BY MOUTH DAILY.   potassium chloride (KLOR-CON) 10 MEQ tablet TAKE 2 TABLETS BY MOUTH TWICE DAILY   Propylene Glycol-Glycerin 1-0.3 % SOLN Place 1 drop into both eyes 3 (three) times daily as needed (for dry eyes).   rivaroxaban (XARELTO) 20 MG TABS tablet TAKE 1 TABLET BY MOUTH ONCE DAILY   sertraline (ZOLOFT) 50 MG tablet Take 50 mg by mouth every evening.      Review of Systems    All other systems reviewed and are otherwise negative except as noted above.  Physical Exam    VS:  Pulse 71   Ht 5\' 5"  (1.651 m)   Wt 238 lb 9.6 oz (108.2 kg)    LMP 06/19/2013   BMI 39.71 kg/m  , BMI Body mass index is 39.71 kg/m.  Wt Readings from Last 3 Encounters:  05/08/21 238 lb 9.6 oz (108.2 kg)  09/17/19 258 lb (117 kg)  02/08/19 240 lb (108.9 kg)    GEN: Well nourished, overweight, well developed, in no acute distress. HEENT: normal. Neck: Supple, no JVD, carotid bruits, or masses. Cardiac: RRR, no murmurs, rubs, or gallops. No clubbing, cyanosis, edema.  Radials/PT 2+ and equal bilaterally.  Respiratory:  Respirations regular and unlabored, clear to auscultation bilaterally. GI: Soft, nontender, nondistended. MS: No deformity or atrophy. Skin: Warm and dry, no rash. Neuro:  Strength and sensation are intact. Psych: Normal affect.  Assessment & Plan    Lightheadedness/orthostatic hypotension-reports occasional lightheadedness happening every other day most often noted with position changes.  Likely etiology orthostatic hypotension.  Does endorse not staying well-hydrated and only eats 2 meals per day.  Orthostatic vital signs today showed drop in blood pressure from sitting 127/48 to standing 115/57.  Unable to ascertain any change in heart rate as with PVC automatic BP cuff did not read correctly.   Orthostatic precautions discussed in detail.  We will update echocardiogram to rule out valvular abnormalities contributory.  Labs today including BMP, CBC, TSH.  DM2 - Continue to follow with PCP. As we are getting labs today she request we collect A1c and will do so. She verbalizes understanding that her PCP will continue to manage Dm2.   MR / TR -mild by echo 09/2019.  Given new lightheadedness, plan to update echocardiogram.  Continue optimal blood pressure control.  Obesity - Weight loss via diet and exercise encouraged. Discussed the impact being overweight would have on cardiovascular risk.  She has lost 20 pounds since last seen over a year ago and was congratulated on weight loss.  HTN - BP well controlled. Continue current  antihypertensive regimen.  Home monitoring encouraged.  PVC / PAF / Chronic anticoagulation -Continued sinus rhythm with occasional PVC today by EKG.  Reports intermittent palpitations which often resolve with deep breathing.  Continue flecainide 50 mg twice daily, Bystolic 10 mg daily.  Denies bleeding complications on Xarelto. CHA2DS2-VASc Score = 2 [CHF  History: 0, HTN History: 1, Diabetes History: 0, Stroke History: 0, Vascular Disease History: 0, Age Score: 0, Gender Score: 1].  Therefore, the patient's annual risk of stroke is 2.2 %.   CBC today for monitoring.  PVC is new finding by EKG today.  If she has more frequent palpitations could consider ZIO monitor at follow-up.   Disposition: Follow up as scheduled in January with Dr. Stanford Breed or APP.  Signed, Loel Dubonnet, NP 05/08/2021, 11:52 AM [place]

## 2021-05-08 NOTE — Patient Instructions (Addendum)
Medication Instructions:  Continue your current medications.   *If you need a refill on your cardiac medications before your next appointment, please call your pharmacy*  Lab Work: Your physician recommends that you return for lab work today: BMP, CBC, TSH, A1c  If you have labs (blood work) drawn today and your tests are completely normal, you will receive your results only by: [place] (if you have MyChart) OR A paper copy in the mail If you have any lab test that is abnormal or we need to change your treatment, we will call you to review the results.   Testing/Procedures: Your EKG today showed normal sinus rhythm with an occasional early beat called a PVC.  Your physician has requested that you have an echocardiogram. Echocardiography is a painless test that uses sound waves to create images of your heart. It provides your doctor with information about the size and shape of your heart and how well your heart's chambers and valves are working. This procedure takes approximately one hour. There are no restrictions for this procedure.   Follow-Up: At [facility], you and your health needs are our priority.  As part of our continuing mission to provide you with exceptional heart care, we have created designated Provider Care Teams.  These Care Teams include your primary Cardiologist (physician) and Advanced Practice Providers (APPs -  Physician Assistants and Nurse Practitioners) who all work together to provide you with the care you need, when you need it.  We recommend signing up for the patient portal called "MyChart".  Sign up information is provided on this After Visit Summary.  MyChart is used to connect with patients for Virtual Visits (Telemedicine).  Patients are able to view lab/test results, encounter notes, upcoming appointments, etc.  Non-urgent messages can be sent to your provider as well.   To learn more about what you can do with MyChart, go to  [email or web address].    Your next appointment:   As scheduled in January with Dr. Stanford Breed  Other Instructions  Hypotension As your heart beats, it forces blood through your body. This force is called blood pressure. If you have hypotension, you have low blood pressure. When your blood pressure is too low, you may not get enough blood to your brain or other parts of your body. This may cause you to feel weak, light-headed, have a fast heartbeat, or even pass out (faint). Low blood pressure may be harmless, or it may cause serious problems. What are the causes? Blood loss. Not enough water in the body (dehydration). Heart problems. Hormone problems. Pregnancy. A very bad infection. Not having enough of certain nutrients. Very bad allergic reactions. Certain medicines. What increases the risk? Age. The risk increases as you get older. Conditions that affect the heart or the brain and spinal cord (central nervous system). Taking certain medicines. Being pregnant. What are the signs or symptoms? Feeling: Weak. Light-headed. Dizzy. Tired (fatigued). Blurred vision. Fast heartbeat. Passing out, in very bad cases. How is this treated? Changing your diet. This may involve eating more salt (sodium) or drinking more water. Taking medicines to raise your blood pressure. Changing how much you take (the dosage) of some of your medicines. Wearing compression stockings. These stockings help to prevent blood clots and reduce swelling in your legs. In some cases, you may need to go to the hospital for: Fluid replacement. This means you will receive fluids through an IV tube. Blood replacement. This means you will receive donated blood through an IV  tube (transfusion). Treating an infection or heart problems, if this applies. Monitoring. You may need to be monitored while medicines that you are taking wear off. Follow these instructions at home: Eating and drinking  Drink enough  fluids to keep your pee (urine) pale yellow. Eat a healthy diet. Follow instructions from your doctor about what you can eat or drink. A healthy diet includes: Fresh fruits and vegetables. Whole grains. Low-fat (lean) meats. Low-fat dairy products. Eat extra salt only as told. Do not add extra salt to your diet unless your doctor tells you to. Eat small meals often. Avoid standing up quickly after you eat. Medicines Take over-the-counter and prescription medicines only as told by your doctor. Follow instructions from your doctor about changing how much you take of your medicines, if this applies. Do not stop or change any of your medicines on your own. General instructions  Wear compression stockings as told by your doctor. Get up slowly from lying down or sitting. Avoid hot showers and a lot of heat as told by your doctor. Return to your normal activities as told by your doctor. Ask what activities are safe for you. Do not use any products that contain nicotine or tobacco, such as cigarettes, e-cigarettes, and chewing tobacco. If you need help quitting, ask your doctor. Keep all follow-up visits as told by your doctor. This is important. Contact a doctor if: You throw up (vomit). You have watery poop (diarrhea). You have a fever for more than 2-3 days. You feel more thirsty than normal. You feel weak and tired. Get help right away if: You have chest pain. You have a fast or uneven heartbeat. You lose feeling (have numbness) in any part of your body. You cannot move your arms or your legs. You have trouble talking. You get sweaty or feel light-headed. You pass out. You have trouble breathing. You have trouble staying awake. You feel mixed up (confused). Summary Hypotension is also called low blood pressure. It is when the force of blood pumping through your arteries is too weak. Hypotension may be harmless, or it may cause serious problems. Treatment may include changing your  diet and medicines, and wearing compression stockings. In very bad cases, you may need to go to the hospital. This information is not intended to replace advice given to you by your health care provider. Make sure you discuss any questions you have with your health care provider. Document Revised: 01/26/2018 Document Reviewed: 01/26/2018 Elsevier Patient Education  2022 [place].   Palpitations Palpitations are feelings that your heartbeat is not normal. Your heartbeat may feel like it is: Uneven. Faster than normal. Fluttering. Skipping a beat. This is usually not a serious problem. In some cases, you may need tests to rule out any serious problems. Follow these instructions at home: Pay attention to any changes in your condition. Take these actions to help manage your symptoms: Eating and drinking Avoid: Coffee, tea, soft drinks, and energy drinks. Chocolate. Alcohol. Diet pills. Lifestyle  Try to lower your stress. These things can help you relax: Yoga. Deep breathing and meditation. Exercise. Using words and images to create positive thoughts (guided imagery). Using your mind to control things in your body (biofeedback). Do not use drugs. Get plenty of rest and sleep. Keep a regular bed time. General instructions  Take over-the-counter and prescription medicines only as told by your doctor. Do not use any products that contain nicotine or tobacco, such as cigarettes and e-cigarettes. If you need help  quitting, ask your doctor. Keep all follow-up visits as told by your doctor. This is important. You may need more tests if palpitations do not go away or get worse. Contact a doctor if: Your symptoms last more than 24 hours. Your symptoms occur more often. Get help right away if you: Have chest pain. Feel short of breath. Have a very bad headache. Feel dizzy. Pass out (faint). Summary Palpitations are feelings that your heartbeat is uneven or faster than normal. It  may feel like your heart is fluttering or skipping a beat. Avoid food and drinks that may cause palpitations. These include caffeine, chocolate, and alcohol. Try to lower your stress. Do not smoke or use drugs. Get help right away if you faint or have chest pain, shortness of breath, a severe headache, or dizziness. This information is not intended to replace advice given to you by your health care provider. Make sure you discuss any questions you have with your health care provider. Document Revised: 11/28/2020 Document Reviewed: 09/14/2017 Elsevier Patient Education  2022 [employer].

## 2021-05-08 NOTE — Telephone Encounter (Signed)
Prescription refill request for Xarelto received.  Indication:atrial fib Last office visit:9/22 Weight:108.2 kg Age:65 Scr:0.8 CrCl:124.54  ml/min  Prescription refilled

## 2021-05-12 LAB — BASIC METABOLIC PANEL
BUN/Creatinine Ratio: 16 (ref 12–28)
BUN: 14 mg/dL (ref 8–27)
CO2: 22 mmol/L (ref 20–29)
Calcium: 9.1 mg/dL (ref 8.7–10.3)
Chloride: 101 mmol/L (ref 96–106)
Creatinine, Ser: 0.87 mg/dL (ref 0.57–1.00)
Glucose: 93 mg/dL (ref 65–99)
Potassium: 3.8 mmol/L (ref 3.5–5.2)
Sodium: 140 mmol/L (ref 134–144)
eGFR: 75 mL/min/{1.73_m2} (ref >59)

## 2021-05-12 LAB — CBC

## 2021-05-12 LAB — HEMOGLOBIN A1C

## 2021-05-12 LAB — TSH: TSH: 2.08 u[IU]/mL (ref 0.450–4.500)

## 2021-05-15 ENCOUNTER — Encounter: Payer: Self-pay | Admitting: *Deleted

## 2021-05-18 ENCOUNTER — Other Ambulatory Visit (HOSPITAL_BASED_OUTPATIENT_CLINIC_OR_DEPARTMENT_OTHER): Payer: Self-pay

## 2021-05-20 ENCOUNTER — Other Ambulatory Visit (HOSPITAL_BASED_OUTPATIENT_CLINIC_OR_DEPARTMENT_OTHER): Payer: Self-pay

## 2021-05-21 ENCOUNTER — Other Ambulatory Visit (HOSPITAL_BASED_OUTPATIENT_CLINIC_OR_DEPARTMENT_OTHER): Payer: Self-pay

## 2021-06-02 ENCOUNTER — Other Ambulatory Visit (HOSPITAL_BASED_OUTPATIENT_CLINIC_OR_DEPARTMENT_OTHER): Payer: Self-pay

## 2021-06-02 MED ORDER — METHOTREXATE 2.5 MG PO TABS
ORAL_TABLET | ORAL | 0 refills | Status: DC
  Filled 2021-06-02: qty 24, 28d supply, fill #0

## 2021-06-09 ENCOUNTER — Other Ambulatory Visit (HOSPITAL_BASED_OUTPATIENT_CLINIC_OR_DEPARTMENT_OTHER): Payer: Self-pay

## 2021-06-15 ENCOUNTER — Other Ambulatory Visit (HOSPITAL_BASED_OUTPATIENT_CLINIC_OR_DEPARTMENT_OTHER): Payer: Self-pay

## 2021-06-15 MED FILL — Flecainide Acetate Tab 50 MG: ORAL | 90 days supply | Qty: 180 | Fill #1 | Status: AC

## 2021-06-17 DIAGNOSIS — L400 Psoriasis vulgaris: Secondary | ICD-10-CM | POA: Diagnosis not present

## 2021-06-17 DIAGNOSIS — Z79899 Other long term (current) drug therapy: Secondary | ICD-10-CM | POA: Diagnosis not present

## 2021-06-18 ENCOUNTER — Other Ambulatory Visit (HOSPITAL_BASED_OUTPATIENT_CLINIC_OR_DEPARTMENT_OTHER): Payer: Self-pay

## 2021-06-19 ENCOUNTER — Other Ambulatory Visit (HOSPITAL_BASED_OUTPATIENT_CLINIC_OR_DEPARTMENT_OTHER): Payer: Self-pay

## 2021-06-19 MED ORDER — METHOTREXATE 2.5 MG PO TABS
ORAL_TABLET | ORAL | 5 refills | Status: DC
  Filled 2021-06-19 – 2021-07-01 (×2): qty 24, 28d supply, fill #0
  Filled 2021-07-29: qty 24, 28d supply, fill #1
  Filled 2021-08-19 – 2021-08-20 (×2): qty 24, 28d supply, fill #2
  Filled 2021-09-21: qty 24, 28d supply, fill #3
  Filled 2021-10-20: qty 24, 28d supply, fill #4
  Filled 2021-11-18: qty 24, 28d supply, fill #5

## 2021-06-30 ENCOUNTER — Ambulatory Visit (HOSPITAL_COMMUNITY): Payer: 59 | Attending: Cardiovascular Disease

## 2021-07-01 ENCOUNTER — Other Ambulatory Visit (HOSPITAL_BASED_OUTPATIENT_CLINIC_OR_DEPARTMENT_OTHER): Payer: Self-pay | Admitting: Family Medicine

## 2021-07-01 ENCOUNTER — Other Ambulatory Visit (HOSPITAL_BASED_OUTPATIENT_CLINIC_OR_DEPARTMENT_OTHER): Payer: Self-pay

## 2021-07-01 ENCOUNTER — Encounter (HOSPITAL_COMMUNITY): Payer: Self-pay | Admitting: Family

## 2021-07-02 ENCOUNTER — Other Ambulatory Visit (HOSPITAL_BASED_OUTPATIENT_CLINIC_OR_DEPARTMENT_OTHER): Payer: Self-pay

## 2021-07-03 ENCOUNTER — Other Ambulatory Visit (HOSPITAL_BASED_OUTPATIENT_CLINIC_OR_DEPARTMENT_OTHER): Payer: Self-pay

## 2021-07-16 ENCOUNTER — Telehealth: Payer: Self-pay | Admitting: Physician Assistant

## 2021-07-16 ENCOUNTER — Ambulatory Visit
Admission: EM | Admit: 2021-07-16 | Discharge: 2021-07-16 | Disposition: A | Discharge status: Home / Self Care | Payer: 59 | Attending: Physician Assistant | Admitting: Physician Assistant

## 2021-07-16 ENCOUNTER — Other Ambulatory Visit: Payer: Self-pay

## 2021-07-16 ENCOUNTER — Telehealth: Payer: 59

## 2021-07-16 ENCOUNTER — Ambulatory Visit (HOSPITAL_COMMUNITY)
Admission: RE | Admit: 2021-07-16 | Discharge: 2021-07-16 | Disposition: A | Discharge status: Home / Self Care | Payer: 59 | Source: Ambulatory Visit | Attending: Family Medicine | Admitting: Family Medicine

## 2021-07-16 DIAGNOSIS — M25562 Pain in left knee: Secondary | ICD-10-CM | POA: Diagnosis not present

## 2021-07-16 DIAGNOSIS — M79605 Pain in left leg: Secondary | ICD-10-CM | POA: Diagnosis not present

## 2021-07-16 DIAGNOSIS — M79662 Pain in left lower leg: Secondary | ICD-10-CM | POA: Diagnosis not present

## 2021-07-16 MED ORDER — ACETAMINOPHEN 325 MG PO TABS
650.0000 mg | ORAL_TABLET | Freq: Once | ORAL | Status: AC
  Administered 2021-07-16: 650 mg via ORAL

## 2021-07-16 NOTE — ED Provider Notes (Signed)
[facility]    CSN: [id number] Arrival date & time: 07/16/21  1439      History   Chief Complaint Chief Complaint  Patient presents with   Knee Pain    HPI Monica [name] is a 65 y.o. female.   Patient here today for evaluation of acute onset of left posterior knee pain that started yesterday. She states she had difficulty climbing the stairs at her house due to pain and states that she has needed to use a cane for assistance. She has had some mild swelling to left lower leg but states that she has not been taking her fluid pill for the last several weeks. She has had some numbness to her left toes but states she has had this at baseline before knee pain started. She denies any known injury recently, does report fall at the beginning of the month but this was several weeks ago. She is currently taking xarelto. States she tried taking arthritis medication without significant relief.   The history is provided by the patient.  Knee Pain Associated symptoms: no fever    Past Medical History:  Diagnosis Date   Atrial fibrillation ([place])    a. s/p DCCV in 12/2012 b. repeat DCCV in 09/2014 - started on Flecainide   Avascular necrosis of bone of right hip ([place])    Dysrhythmia    a fib   Hypertension    Morbid obesity ([place])    Osteoarthritis    Personal history of colonic polyps - large hyperplastic 12/25/2013   Pre-diabetes    Psoriasis    active breakout left buttocks   Right hip pain    Shortness of breath    Sleep apnea    uses mouth guard only   Tachycardia, unspecified     Patient Active Problem List   Diagnosis Date Noted   Status post left hip replacement 04/07/2018   Unilateral primary osteoarthritis, left hip 02/23/2018   Psoriatic arthropathy ([place]) 11/17/2017   History of total hip replacement, right 11/17/2017   Chronic anticoagulation 11/19/2016   Tinnitus 11/19/2016   Right knee pain 07/06/2016   Left hip pain 10/21/2015   Gastroesophageal  reflux disease 09/19/2015   Itchy eyes 03/12/2015   Allergic conjunctivitis 03/12/2015   OSA (obstructive sleep apnea) 02/20/2015   Morbid obesity ([place]) 01/15/2015   Lymphedema 06/19/2014   Personal history of colonic polyps - large hyperplastic 12/25/2013   Skin infection 10/14/2013   Candidiasis of female genitalia 10/14/2013   IFG (impaired fasting glucose) 10/14/2013   Carpal tunnel syndrome 10/14/2013   Cough 06/20/2013   Allergic rhinitis, cause unspecified 06/20/2013   Backache 06/05/2013   Edema 12/20/2012   Swelling of limb 11/30/2012   Essential hypertension, benign 11/30/2012   Headache(784.0) 11/30/2012   Avascular necrosis of hip ([place]) 07/21/2012   Atrial fibrillation ([place]) 07/06/2012   Hypertension 09/14/2011   Psoriasis 08/16/2005    Past Surgical History:  Procedure Laterality Date   CARDIOVERSION N/A 01/09/2013   Procedure: CARDIOVERSION;  Surgeon: [name], MD;  Location: [place];  Service: Cardiovascular;  Laterality: N/A;   CARDIOVERSION N/A 10/07/2014   Procedure: CARDIOVERSION;  Surgeon: [name], MD;  Location: [facility] ENDOSCOPY;  Service: Cardiovascular;  Laterality: N/A;  09:00 Lido 60mg ,IV followed by Propofol  70mg /IV    synched electrocardioversion at 120 joules for Afib,repeated at 200 joules, 70 mg...successfully changed to [place]  07/21/12   right hip arthroplasty   JOINT REPLACEMENT  Left hip Dr. [name] 04-07-18   TOTAL HIP ARTHROPLASTY  07/21/2012   Procedure: TOTAL HIP ARTHROPLASTY;  Surgeon: [name], MD;  Location: [facility];  Service: Orthopedics;  Laterality: Right;   TOTAL HIP ARTHROPLASTY Left 04/07/2018   Procedure: LEFT TOTAL HIP ARTHROPLASTY ANTERIOR APPROACH;  Surgeon: [name], MD;  Location: [facility];  Service: Orthopedics;  Laterality: Left;  Needs RNFA    OB History   No obstetric history on file.      Home Medications    Prior to Admission medications   Medication Sig  Start Date End Date Taking? Authorizing Provider  acetaminophen (TYLENOL) 650 MG CR tablet Take 650-1,300 mg by mouth every 8 (eight) hours as needed for pain.    [provider]  Blood Pressure Monitor MISC Use to check blood pressure once daily 05/08/21   Loel Dubonnet, NP  BYSTOLIC 10 MG tablet Take 5 mg by mouth every evening.  11/03/16   [provider]  calcium carbonate (TUMS EX) 750 MG chewable tablet Chew 1-3 tablets by mouth 3 (three) times daily as needed for heartburn.    [provider]  Cholecalciferol (VITAMIN D3) 2000 units TABS Take 2,000 Units by mouth every evening.    [provider]  clobetasol cream (TEMOVATE) 0.08 % Apply 1 application topically 2 (two) times daily as needed (for psoriasis).    [provider]  COVID-19 mRNA Vac-TriS, Pfizer, SUSP injection Inject into the muscle. 02/06/21   Carlyle Basques, MD  desonide (DESOWEN) 0.05 % cream Apply 1 application topically 2 (two) times daily as needed (for psoriasis).  11/04/17   [provider]  diazepam (VALIUM) 5 MG tablet Take one by mouth one hour prior to MRI, repeat as needed 12/05/19   [name], MD  diclofenac sodium (VOLTAREN) 1 % GEL Apply 4 g topically 4 (four) times daily. Patient taking differently: Apply 4 g topically 4 (four) times daily as needed (pain). 07/01/16   Hudnall, Sharyn Lull, MD  Dulaglutide (TRULICITY) 3 QP/6.1PJ SOPN Inject 0.5 mLs (3 mg total) into the skin every 7 days. 02/11/21     flecainide (TAMBOCOR) 50 MG tablet TAKE 1 TABLET BY MOUTH TWICE DAILY **NEEDS OFFICE VISIT FOR FURTHER REFILLS** 11/12/20 11/12/21  [name], MD  fluticasone (FLONASE) 50 MCG/ACT nasal spray PLACE 2 SPRAYS IN EACH NOSTRIL EVERYDAY 0/93/26     folic acid (FOLVITE) 1 MG tablet TAKE 2 TABLETS BY MOUTH DAILY 11/26/20     hydrochlorothiazide (HYDRODIURIL) 25 MG tablet TAKE 1 TABLET BY MOUTH EVERY MORNING 05/26/20 06/17/21  [name], MD   methotrexate (RHEUMATREX) 2.5 MG tablet Take 6 tablet by mouth once a week 06/19/21     Multiple Vitamin (MULTIVITAMIN WITH MINERALS) TABS tablet Take 1 tablet by mouth daily.    [provider]  nebivolol (BYSTOLIC) 10 MG tablet TAKE 1 TABLET (10 MG TOTAL) BY MOUTH DAILY. 05/26/20 05/26/21  Zanard, Bernadene Bell, MD  potassium chloride (KLOR-CON) 10 MEQ tablet TAKE 2 TABLETS BY MOUTH TWICE DAILY 02/11/21     Propylene Glycol-Glycerin 1-0.3 % SOLN Place 1 drop into both eyes 3 (three) times daily as needed (for dry eyes).    [provider]  rivaroxaban (XARELTO) 20 MG TABS tablet TAKE 1 TABLET BY MOUTH ONCE DAILY 05/08/21 05/08/22  [name], MD  sertraline (ZOLOFT) 50 MG tablet Take 50 mg by mouth every evening.  12/08/16   [provider]    Family  History Family History  Problem Relation Age of Onset   Hypertension Mother    Diabetes Mother    Alzheimer's disease Mother    Thyroid disease Mother    Diabetes Son     Social History Social History   Tobacco Use   Smoking status: Former    Types: Cigarettes    Quit date: 09/13/1982    Years since quitting: 38.8   Smokeless tobacco: Never  Vaping Use   Vaping Use: Never used  Substance Use Topics   Alcohol use: Yes    Alcohol/week: 0.0 standard drinks    Comment: Occasionally   Drug use: No     Allergies   Patient has no known allergies.   Review of Systems Review of Systems  Constitutional:  Negative for chills and fever.  Eyes:  Negative for discharge and redness.  Respiratory:  Negative for shortness of breath.   Cardiovascular:  Positive for leg swelling.  Gastrointestinal:  Negative for abdominal pain, nausea and vomiting.  Genitourinary:  Positive for vaginal bleeding and vaginal discharge.  Musculoskeletal:  Positive for arthralgias.  Skin:  Negative for color change.  Neurological:  Positive for numbness.    Physical Exam Triage Vital Signs ED Triage Vitals [07/16/21 1453]  Enc  Vitals Group     BP (!) 141/86     Pulse Rate 70     Resp 18     Temp 97.8 F (36.6 C)     Temp Source Oral     SpO2 99 %     Weight      Height      Head Circumference      Peak Flow      Pain Score 8     Pain Loc      Pain Edu?      Excl. in [place]?    No data found.  Updated Vital Signs BP (!) 141/86 (BP Location: Left Arm)   Pulse 70   Temp 97.8 F (36.6 C) (Oral)   Resp 18   LMP 06/19/2013   SpO2 99%     Physical Exam Vitals and nursing note reviewed.  Constitutional:      General: She is not in acute distress.    Appearance: Normal appearance. She is not ill-appearing.  HENT:     Head: Normocephalic and atraumatic.  Eyes:     Conjunctiva/sclera: Conjunctivae normal.  Cardiovascular:     Rate and Rhythm: Normal rate.  Pulmonary:     Effort: Pulmonary effort is normal.  Musculoskeletal:     Comments: TTP noted diffusely to left posterior knee, mild TTP to left lower leg  Neurological:     Mental Status: She is alert.  Psychiatric:        Mood and Affect: Mood normal.        Behavior: Behavior normal.        Thought Content: Thought content normal.     UC Treatments / Results  Labs (all labs ordered are listed, but only abnormal results are displayed) Labs Reviewed - No data to display  EKG   Radiology No results found.  Procedures Procedures (including critical care time)  Medications Ordered in UC Medications  acetaminophen (TYLENOL) tablet 650 mg (650 mg Oral Given 07/16/21 1512)    Initial Impression / Assessment and Plan / UC Course  I have reviewed the triage vital signs and the nursing notes.  Pertinent labs & imaging results that were available during my care of the patient  were reviewed by me and considered in my medical decision making (see chart for details).    Venous doppler ordered to rule out DVT. If negative recommend further evaluation by ortho.   Final Clinical Impressions(s) / UC Diagnoses   Final diagnoses:   Posterior left knee pain   Discharge Instructions   None    ED Prescriptions   None    PDMP not reviewed this encounter.   Francene Finders, PA-C 07/16/21 1551

## 2021-07-16 NOTE — Telephone Encounter (Signed)
Attempted to call with results-- No answer. Negative Ultrasound, recommend follow up with ortho.

## 2021-07-16 NOTE — Progress Notes (Signed)
Lower extremity venous has been completed.   Preliminary results in CV Proc.   Jinny Blossom Shubham Thackston 07/16/2021 4:11 PM

## 2021-07-16 NOTE — ED Triage Notes (Signed)
Pt c/o lt knee pain since yesterday. States having to use a cane to walk. States went on a bus trip 2wks.

## 2021-07-29 ENCOUNTER — Other Ambulatory Visit (HOSPITAL_BASED_OUTPATIENT_CLINIC_OR_DEPARTMENT_OTHER): Payer: Self-pay

## 2021-08-03 IMAGING — MR MR HIP*L* W/O CM
7 series · 40 of 40 positions shown · non-contrast
Comparison: Radiographs 04/05/2019

CLINICAL DATA: Chronic left hip pain and weakness. History of total
hip arthroplasty 2 years ago.

EXAM:
MR OF THE LEFT HIP WITHOUT CONTRAST
TECHNIQUE: Multiplanar, multisequence MR imaging was performed. No intravenous
contrast was administered.

[Series 3: T1 · axial · 4.0mm · 1.48mm/px · z∈[-128,+142]mm · 7 of 50 slices shown (1 of 2)]
[im 1/50]
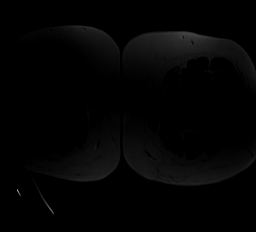
[im 9/50]
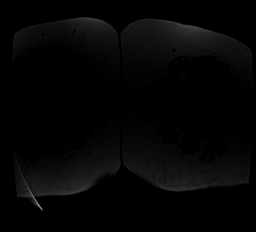
[im 17/50]
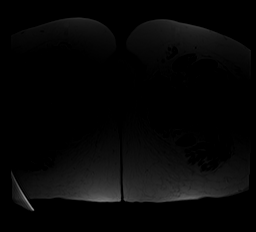
[im 25/50]
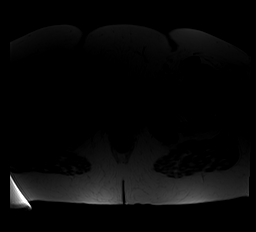
[im 33/50]
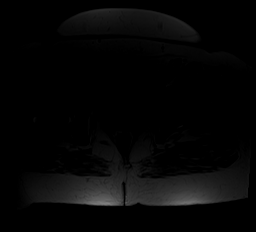
[im 41/50]
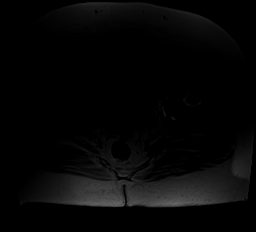
[im 50/50]
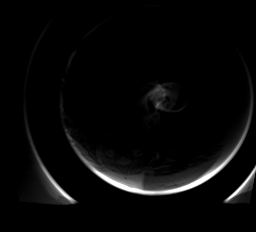

[Series 4: STIR · axial · 4.0mm · 1.48mm/px · z∈[-133,+142]mm · 8 of 56 slices shown (1 of 2)]
[im 1/56]
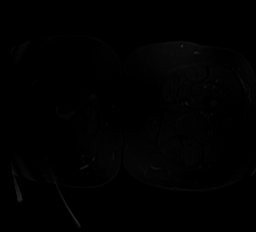
[im 8/56]
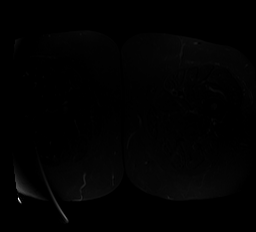
[im 16/56]
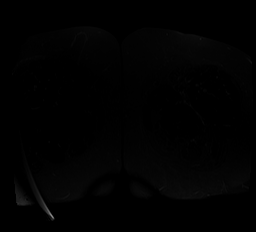
[im 24/56]
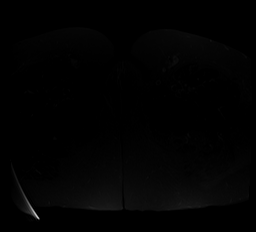
[im 32/56]
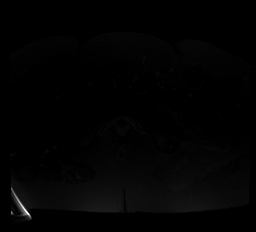
[im 40/56]
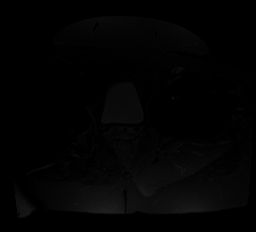
[im 48/56]
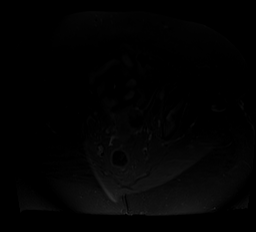
[im 56/56]
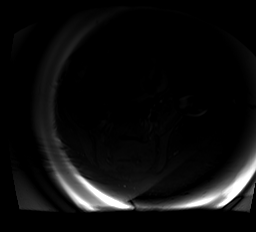

[Series 5: T2 · axial · 4.0mm · 1.48mm/px · z∈[-133,+142]mm · 8 of 54 slices shown]
[im 1/54]
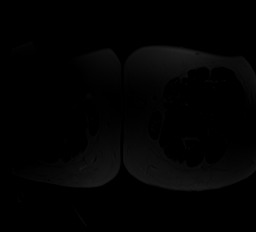
[im 8/54]
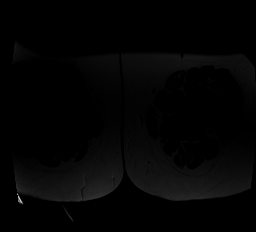
[im 16/54]
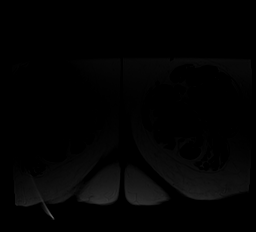
[im 23/54]
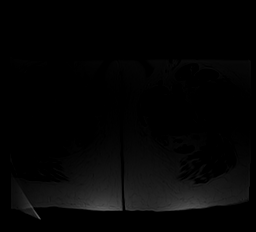
[im 31/54]
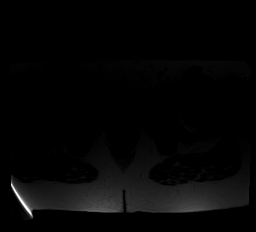
[im 38/54]
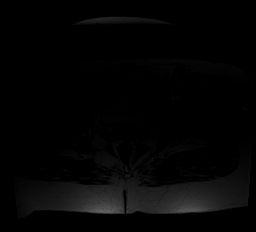
[im 46/54]
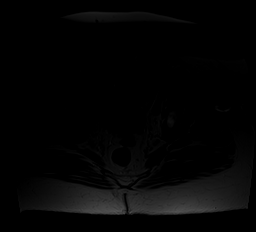
[im 54/54]
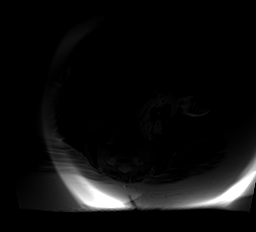

[Series 6: STIR · coronal · 4.0mm · 1.48mm/px · 5 of 34 slices shown (2 of 2)]
[im 1/34]
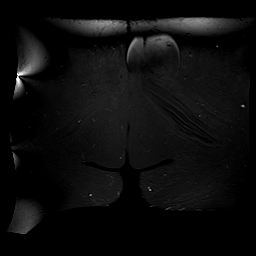
[im 9/34]
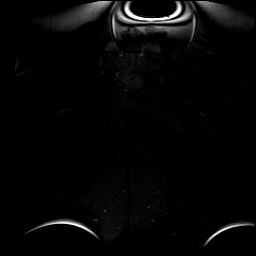
[im 17/34]
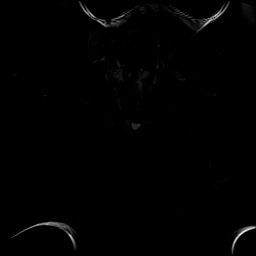
[im 25/34]
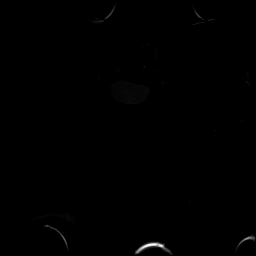
[im 34/34]
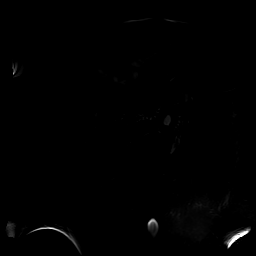

[Series 7: [id number]_bw · sagittal · 4.0mm · 0.86mm/px · 4 of 30 slices shown]
[im 1/30]
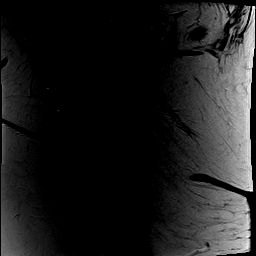
[im 10/30]
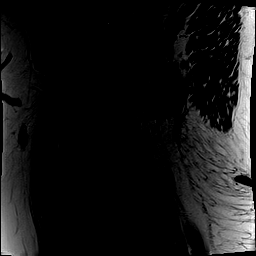
[im 20/30]
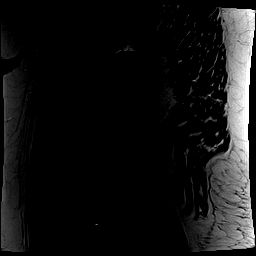
[im 30/30]
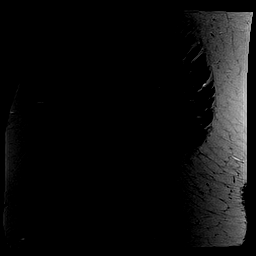

[Series 8: T1 · coronal · 4.0mm · 1.48mm/px · 5 of 34 slices shown (2 of 2)]
[im 1/34]
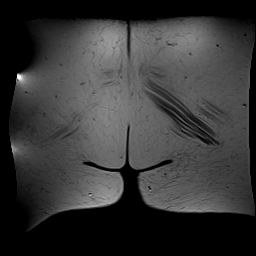
[im 9/34]
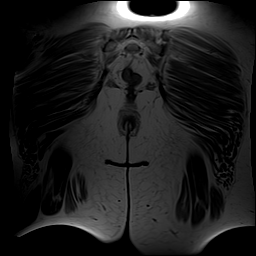
[im 17/34]
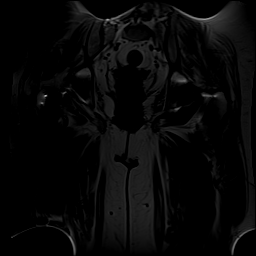
[im 25/34]
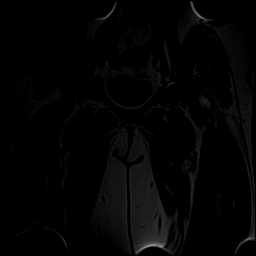
[im 34/34]
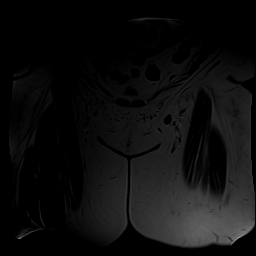

[Series 100: hx · coronal · 6.0mm · 0.86mm/px · 3 of 21 slices shown]
[im 1/21]
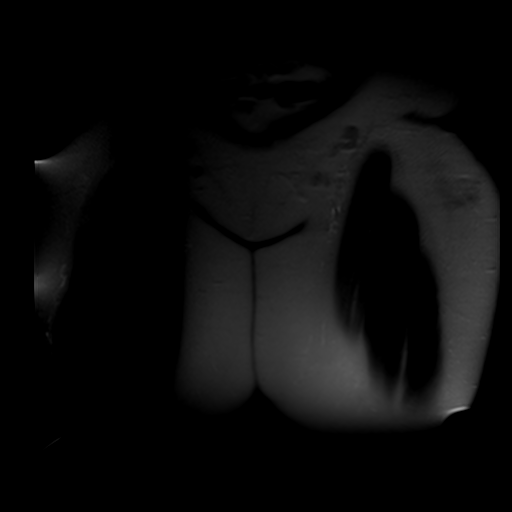
[im 11/21]
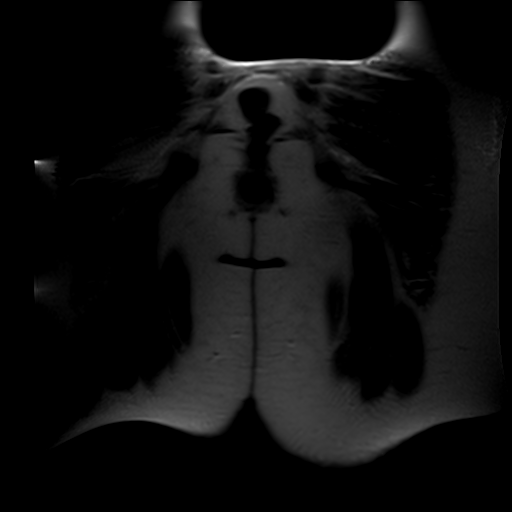
[im 21/21]
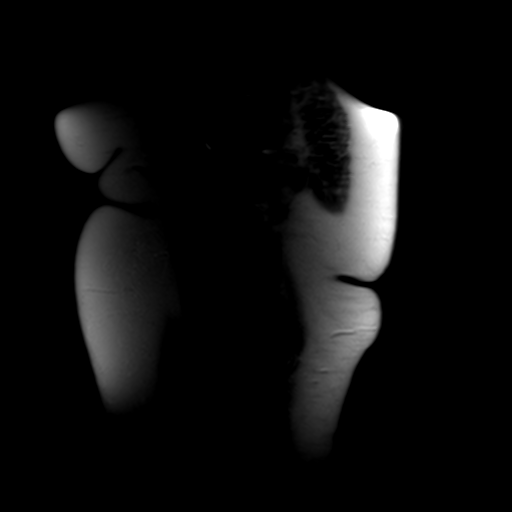

[40 of 40 positions shown; findings below may reference images not displayed]

FINDINGS: Bilateral total hip arthroplasties are noted. Associated moderate
artifact. No complicating features are identified with either
prosthesis. I do not see any MR findings suspicious for loosening,
fracture or infection. No abnormal joint effusions or periarticular
fluid collections to suggest metallosis.

The surrounding hip and pelvic musculature appear grossly normal. No
obvious muscle tears, myositis or significant fatty atrophy. The
hamstring tendons appear normal. No findings for trochanteric
bursitis.

No significant intrapelvic abnormalities are identified. 2.5 cm
uterine fibroid noted.
IMPRESSION: 1. Bilateral total hip arthroplasties without complicating features.
No MR findings to suggest loosening, infection, fracture or
metallosis.
2. No significant intrapelvic abnormalities. 2.5 cm uterine fibroid
noted.

## 2021-08-11 ENCOUNTER — Ambulatory Visit (HOSPITAL_BASED_OUTPATIENT_CLINIC_OR_DEPARTMENT_OTHER): Payer: 59 | Admitting: Nurse Practitioner

## 2021-08-11 ENCOUNTER — Encounter (HOSPITAL_BASED_OUTPATIENT_CLINIC_OR_DEPARTMENT_OTHER): Payer: Self-pay

## 2021-08-12 ENCOUNTER — Other Ambulatory Visit (HOSPITAL_BASED_OUTPATIENT_CLINIC_OR_DEPARTMENT_OTHER): Payer: Self-pay

## 2021-08-12 ENCOUNTER — Other Ambulatory Visit: Payer: Self-pay | Admitting: Cardiology

## 2021-08-12 MED FILL — Nebivolol HCl Tab 10 MG (Base Equivalent): ORAL | 90 days supply | Qty: 90 | Fill #0 | Status: AC

## 2021-08-13 ENCOUNTER — Other Ambulatory Visit (HOSPITAL_BASED_OUTPATIENT_CLINIC_OR_DEPARTMENT_OTHER): Payer: Self-pay

## 2021-08-13 MED ORDER — CLOBETASOL PROPIONATE 0.05 % EX OINT
TOPICAL_OINTMENT | CUTANEOUS | 0 refills | Status: DC
  Filled 2021-08-13: qty 60, 30d supply, fill #0

## 2021-08-19 ENCOUNTER — Other Ambulatory Visit (HOSPITAL_BASED_OUTPATIENT_CLINIC_OR_DEPARTMENT_OTHER): Payer: Self-pay

## 2021-08-20 ENCOUNTER — Other Ambulatory Visit (HOSPITAL_BASED_OUTPATIENT_CLINIC_OR_DEPARTMENT_OTHER): Payer: Self-pay

## 2021-08-24 DIAGNOSIS — L400 Psoriasis vulgaris: Secondary | ICD-10-CM | POA: Diagnosis not present

## 2021-09-02 NOTE — Progress Notes (Signed)
HPI: FU atrial fibrillation. Patient underwent cardioversion on 01/09/2013 to sinus rhythm. Patient developed recurrent atrial fibrillation and had a repeat cardioversion successfully on flecainide. Follow-up exercise treadmill on flecanide did not show significant arrhythmia.  Echocardiogram February 2021 showed normal LV function, mild left ventricular hypertrophy, severe left atrial enlargement, mild mitral regurgitation, mild pulmonary hypertension.  Venous Dopplers December 2022 showed no DVT.  Since I last saw her, she has some fatigue but denies dyspnea on exertion, orthopnea, PND, pedal edema, chest pain, palpitations or syncope.  No bleeding.  Current Outpatient Medications  Medication Sig Dispense Refill   acetaminophen (TYLENOL) 650 MG CR tablet Take 650-1,300 mg by mouth every 8 (eight) hours as needed for pain.     Blood Pressure Monitor MISC Use to check blood pressure once daily 1 each 0   BYSTOLIC 10 MG tablet Take 0.5 tablets (5 mg total) by mouth every evening. 45 tablet 3   calcium carbonate (TUMS EX) 750 MG chewable tablet Chew 1-3 tablets by mouth 3 (three) times daily as needed for heartburn.     Cholecalciferol (VITAMIN D3) 2000 units TABS Take 2,000 Units by mouth every evening.     clobetasol ointment (TEMOVATE) 5.03 % Apply 1 application on the skin DAILY Apply daily to thick areas 60 g 0   desonide (DESOWEN) 0.05 % cream Apply 1 application topically 2 (two) times daily as needed (for psoriasis).   0   diclofenac sodium (VOLTAREN) 1 % GEL Apply 4 g topically 4 (four) times daily. (Patient taking differently: Apply 4 g topically 4 (four) times daily as needed (pain).) 5 Tube 1   Dulaglutide (TRULICITY) 3 UU/8.2CM SOPN Inject 0.5 mLs (3 mg total) into the skin every 7 days. 2 mL 5   flecainide (TAMBOCOR) 50 MG tablet TAKE 1 TABLET BY MOUTH TWICE DAILY **NEEDS OFFICE VISIT FOR FURTHER REFILLS** 180 tablet 3   fluticasone (FLONASE) 50 MCG/ACT nasal spray PLACE 2 SPRAYS  IN EACH NOSTRIL EVERY DAY 16 g 3   folic acid (FOLVITE) 1 MG tablet TAKE 2 TABLETS BY MOUTH DAILY 60 tablet 3   hydrochlorothiazide (HYDRODIURIL) 25 MG tablet Take 1 tablet (25 mg total) by mouth every morning. 90 tablet 3   methotrexate (RHEUMATREX) 2.5 MG tablet Take 6 tablet by mouth once a week 24 tablet 5   Multiple Vitamin (MULTIVITAMIN WITH MINERALS) TABS tablet Take 1 tablet by mouth daily.     potassium chloride (KLOR-CON) 10 MEQ tablet TAKE 2 TABLETS BY MOUTH TWICE DAILY 360 tablet 3   Propylene Glycol-Glycerin 1-0.3 % SOLN Place 1 drop into both eyes 3 (three) times daily as needed (for dry eyes).     rivaroxaban (XARELTO) 20 MG TABS tablet TAKE 1 TABLET BY MOUTH ONCE DAILY 90 tablet 1   sertraline (ZOLOFT) 50 MG tablet Take 1 tablet (50 mg total) by mouth at bedtime. 90 tablet 3   No current facility-administered medications for this visit.     Past Medical History:  Diagnosis Date   Atrial fibrillation [facility])    a. s/p DCCV in 12/2012 b. repeat DCCV in 09/2014 - started on Flecainide   Avascular necrosis of bone of right hip ([facility])    Dysrhythmia    a fib   Hypertension    Morbid obesity ([place])    Osteoarthritis    Personal history of colonic polyps - large hyperplastic 12/25/2013   Pre-diabetes    Psoriasis    active breakout left buttocks  Right hip pain    Shortness of breath    Sleep apnea    uses mouth guard only   Tachycardia, unspecified     Past Surgical History:  Procedure Laterality Date   CARDIOVERSION N/A 01/09/2013   Procedure: CARDIOVERSION;  Surgeon: [name], MD;  Location: [facility] ENDOSCOPY;  Service: Cardiovascular;  Laterality: N/A;   CARDIOVERSION N/A 10/07/2014   Procedure: CARDIOVERSION;  Surgeon: [name], MD;  Location: [facility] ENDOSCOPY;  Service: Cardiovascular;  Laterality: N/A;  09:00 Lido 60mg ,IV followed by Propofol  70mg /IV    synched electrocardioversion at 120 joules for Afib,repeated at 200 joules, 70 mg...successfully changed to  [place]  07/21/12   right hip arthroplasty   JOINT REPLACEMENT     Left hip Dr. [name] 04-07-18   TOTAL HIP ARTHROPLASTY  07/21/2012   Procedure: TOTAL HIP ARTHROPLASTY;  Surgeon: [name], MD;  Location: [facility];  Service: Orthopedics;  Laterality: Right;   TOTAL HIP ARTHROPLASTY Left 04/07/2018   Procedure: LEFT TOTAL HIP ARTHROPLASTY ANTERIOR APPROACH;  Surgeon: [name], MD;  Location[facility];  Service: Orthopedics;  Laterality: Left;  Needs RNFA    Social History   Socioeconomic History   Marital status: Divorced    Spouse name: [name]   Number of children: 1   Years of education: 12   Highest education level: Not on file  Occupational History   Occupation: ED ADMISSIONS     Employer: [employer]    Comment: [employer]   Tobacco Use   Smoking status: Former    Types: Cigarettes    Quit date: 09/13/1982    Years since quitting: 39.0   Smokeless tobacco: Never  Vaping Use   Vaping Use: Never used  Substance and Sexual Activity   Alcohol use: Yes    Alcohol/week: 0.0 standard drinks    Comment: Occasionally   Drug use: No   Sexual activity: Yes    Partners: Male  Other Topics Concern   Not on file  Social History Narrative   Marital Status: Married ([name])    Children:  Son [name])    Pets: None   Living Situation: Lives with [name]   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    EducationField seismologist    Tobacco Use/Exposure: Former Smoker.  She used to smoke 4 cigarettes per day for five years and quit 32 years ago.    Alcohol Use:  Occasional   Drug Use:  None   Diet:  Regular   Exercise:  Water Aerobics (2 x per week)     Hobbies: Shopping, Cycling             Social Determinants of Health   Financial Resource Strain: Not on file  Food Insecurity: Not on file  Transportation Needs: Not on file  Physical Activity: Not on file  Stress: Not on file  Social  Connections: Not on file  Intimate Partner Violence: Not on file    Family History  Problem Relation Age of Onset   Hypertension Mother    Diabetes Mother    Alzheimer's disease Mother    Thyroid disease Mother    Diabetes Son     ROS: no fevers or chills, productive cough, hemoptysis, dysphasia, odynophagia, melena, hematochezia, dysuria, hematuria, rash, seizure activity, orthopnea, PND, pedal edema, claudication. Remaining systems are negative.  Physical Exam: Well-developed obese in no acute distress.  Skin is  warm and dry.  HEENT is normal.  Neck is supple.  Chest is clear to auscultation with normal expansion.  Cardiovascular exam is irregular Abdominal exam nontender or distended. No masses palpated. Extremities show no edema. neuro grossly intact  ECG-atrial fibrillation with PVCs or aberrantly conducted beats.  Personally reviewed  A/P  1 paroxysmal atrial fibrillation-patient has developed recurrent atrial fibrillation.  Her heart rate is controlled and we will continue present dose of Bystolic.  Continue Xarelto.  I reviewed the options today with her including rate control versus rhythm control.  Her atrial fibrillation has recurred since September (at that time she was in normal sinus rhythm on her electrocardiogram).  I think she is relatively asymptomatic.  She would like to be conservative at this point.  She will continue with present dose of flecainide.  I will see her back in 8 to 12 weeks and hopefully she will have converted to sinus on her own.  If not we could consider increasing flecainide to 100 mg twice daily and repeating cardioversion if she wishes.  Alternatively we could plan rate control and anticoagulation long-term.  We will make a final decision when I see her back.  We will plan to repeat echocardiogram.  2 hypertension-patient's blood pressure is controlled.  Continue present medical regimen.  3 obesity-needs weight loss.  4 valvular heart  disease-mitral regurgitation is mild on most recent echocardiogram.  5 edema-controlled at present.  We will continue diuretic at present dose.    Kirk Ruths, MD

## 2021-09-10 ENCOUNTER — Encounter (HOSPITAL_BASED_OUTPATIENT_CLINIC_OR_DEPARTMENT_OTHER): Payer: Self-pay | Admitting: Nurse Practitioner

## 2021-09-10 ENCOUNTER — Ambulatory Visit (HOSPITAL_BASED_OUTPATIENT_CLINIC_OR_DEPARTMENT_OTHER): Payer: 59 | Admitting: Nurse Practitioner

## 2021-09-10 ENCOUNTER — Other Ambulatory Visit (HOSPITAL_BASED_OUTPATIENT_CLINIC_OR_DEPARTMENT_OTHER): Payer: Self-pay

## 2021-09-10 ENCOUNTER — Other Ambulatory Visit: Payer: Self-pay

## 2021-09-10 VITALS — BP 116/60 | HR 78 | Ht 65.5 in | Wt 240.0 lb

## 2021-09-10 DIAGNOSIS — Z13228 Encounter for screening for other metabolic disorders: Secondary | ICD-10-CM | POA: Diagnosis not present

## 2021-09-10 DIAGNOSIS — M87059 Idiopathic aseptic necrosis of unspecified femur: Secondary | ICD-10-CM

## 2021-09-10 DIAGNOSIS — H9313 Tinnitus, bilateral: Secondary | ICD-10-CM | POA: Diagnosis not present

## 2021-09-10 DIAGNOSIS — Z96641 Presence of right artificial hip joint: Secondary | ICD-10-CM

## 2021-09-10 DIAGNOSIS — Z1231 Encounter for screening mammogram for malignant neoplasm of breast: Secondary | ICD-10-CM

## 2021-09-10 DIAGNOSIS — Z130 Encounter for screening for diseases of the blood and blood-forming organs and certain disorders involving the immune mechanism: Secondary | ICD-10-CM

## 2021-09-10 DIAGNOSIS — E118 Type 2 diabetes mellitus with unspecified complications: Secondary | ICD-10-CM

## 2021-09-10 DIAGNOSIS — Z1329 Encounter for screening for other suspected endocrine disorder: Secondary | ICD-10-CM | POA: Diagnosis not present

## 2021-09-10 DIAGNOSIS — Z13820 Encounter for screening for osteoporosis: Secondary | ICD-10-CM

## 2021-09-10 DIAGNOSIS — I480 Paroxysmal atrial fibrillation: Secondary | ICD-10-CM | POA: Diagnosis not present

## 2021-09-10 DIAGNOSIS — J309 Allergic rhinitis, unspecified: Secondary | ICD-10-CM

## 2021-09-10 DIAGNOSIS — R609 Edema, unspecified: Secondary | ICD-10-CM | POA: Diagnosis not present

## 2021-09-10 DIAGNOSIS — M1612 Unilateral primary osteoarthritis, left hip: Secondary | ICD-10-CM

## 2021-09-10 DIAGNOSIS — E559 Vitamin D deficiency, unspecified: Secondary | ICD-10-CM

## 2021-09-10 DIAGNOSIS — Z1321 Encounter for screening for nutritional disorder: Secondary | ICD-10-CM

## 2021-09-10 DIAGNOSIS — G4733 Obstructive sleep apnea (adult) (pediatric): Secondary | ICD-10-CM | POA: Diagnosis not present

## 2021-09-10 DIAGNOSIS — E119 Type 2 diabetes mellitus without complications: Secondary | ICD-10-CM | POA: Insufficient documentation

## 2021-09-10 DIAGNOSIS — Z7901 Long term (current) use of anticoagulants: Secondary | ICD-10-CM | POA: Diagnosis not present

## 2021-09-10 DIAGNOSIS — I10 Essential (primary) hypertension: Secondary | ICD-10-CM

## 2021-09-10 DIAGNOSIS — L4050 Arthropathic psoriasis, unspecified: Secondary | ICD-10-CM | POA: Diagnosis not present

## 2021-09-10 DIAGNOSIS — F32A Depression, unspecified: Secondary | ICD-10-CM | POA: Insufficient documentation

## 2021-09-10 DIAGNOSIS — Z1211 Encounter for screening for malignant neoplasm of colon: Secondary | ICD-10-CM

## 2021-09-10 MED ORDER — TRULICITY 3 MG/0.5ML ~~LOC~~ SOAJ
SUBCUTANEOUS | 5 refills | Status: DC
  Filled 2021-09-10: qty 2, 28d supply, fill #0
  Filled 2021-10-14: qty 2, 28d supply, fill #1
  Filled 2021-11-18: qty 2, 28d supply, fill #2
  Filled 2021-12-24: qty 2, 28d supply, fill #3
  Filled 2022-01-27: qty 2, 28d supply, fill #4
  Filled 2022-03-15: qty 2, 28d supply, fill #5

## 2021-09-10 MED ORDER — BYSTOLIC 10 MG PO TABS
5.0000 mg | ORAL_TABLET | Freq: Every evening | ORAL | 3 refills | Status: DC
  Filled 2021-09-10 – 2022-04-05 (×2): qty 45, 90d supply, fill #0

## 2021-09-10 MED ORDER — HYDROCHLOROTHIAZIDE 25 MG PO TABS
25.0000 mg | ORAL_TABLET | Freq: Every morning | ORAL | 3 refills | Status: DC
  Filled 2021-09-10: qty 90, 90d supply, fill #0
  Filled 2022-01-14: qty 90, 90d supply, fill #1

## 2021-09-10 MED ORDER — FLUTICASONE PROPIONATE 50 MCG/ACT NA SUSP
NASAL | 3 refills | Status: DC
  Filled 2021-09-10: qty 16, 30d supply, fill #0

## 2021-09-10 MED ORDER — SERTRALINE HCL 50 MG PO TABS
50.0000 mg | ORAL_TABLET | Freq: Every day | ORAL | 3 refills | Status: DC
  Filled 2021-09-10: qty 90, 90d supply, fill #0
  Filled 2021-12-14: qty 90, 90d supply, fill #1
  Filled 2022-03-15: qty 90, 90d supply, fill #2

## 2021-09-10 NOTE — Progress Notes (Signed)
Orma Render, DNP, AGNP-c [facility] [address]   [place], [place] [address] [phone] [phone]  New patient visit   Patient: MARRIETTA [name]   DOB: August 09, 1956   66 y.o. Female  MRN: [id number] Visit Date: 09/10/2021  Patient Care Team: Abelina Ketron, [name], NP as PCP - General (Nurse Practitioner) [name], MD as PCP - Cardiology (Cardiology)  Today's healthcare provider: Orma Render, NP   Chief Complaint  Patient presents with   Establish Care    Prior PCP - Dr. Dion Saucier   Medication Refill    Patient has been unable to see prior pcp for several months and therefore has been out of some of her maintenance medications. She request refills today   Subjective    HPI HPI     Establish Care    Additional comments: Prior PCP - Dr. Dion Saucier        Medication Refill    Additional comments: Patient has been unable to see prior pcp for several months and therefore has been out of some of her maintenance medications. She request refills today      Last edited by Bobby Rumpf, Brimhall Nizhoni on 09/10/2021 11:11 AM.      Monica [name] is a 66 y.o. female who presents today as a new patient to establish care.    Neoma Laming reports her previous PCP had to leave practice and therefore she is in need of transition of care. She is followed by Dr. Stanford Breed with cardiology, Dr. Ronnald Ramp with dermatology, she does have a history of atrial fibrillation, hypertension, and chronic psoriasis.  She tells me that she has been out of a few of her medications for the past several months due to her previous PCP leaving practice.  She would like to get restarted on these and get up-to-date on her health maintenance.  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  She tells me she enjoys her work. She is currently married and has 1 son. She does express significant concerns with increased mood changes related to being estranged from her  son.  She tells me he is a quadriplegic and she does not get along well with his wife and she has not been in contact with him in some time which is quite distressing for her.  She has not had any recent counseling services but would like to set up with a female provider with a focus on Christian counseling.  She would like referral for this today.  Past Medical History:  Diagnosis Date   Atrial fibrillation ([place])    a. s/p DCCV in 12/2012 b. repeat DCCV in 09/2014 - started on Flecainide   Avascular necrosis of bone of right hip (HCC)    Dysrhythmia    a fib   Hypertension    Morbid obesity ([place])    Osteoarthritis    Personal history of colonic polyps - large hyperplastic 12/25/2013   Pre-diabetes    Psoriasis    active breakout left buttocks   Right hip pain    Shortness of breath    Sleep apnea    uses mouth guard only   Tachycardia, unspecified    Past Surgical History:  Procedure Laterality Date   CARDIOVERSION N/A 01/09/2013   Procedure: CARDIOVERSION;  Surgeon: [name], MD;  Location: [facility] ENDOSCOPY;  Service: Cardiovascular;  Laterality: N/A;   CARDIOVERSION N/A 10/07/2014   Procedure: CARDIOVERSION;  Surgeon: Aaron Edelman  Jacalyn Lefevre, MD;  Location: [facility] ENDOSCOPY;  Service: Cardiovascular;  Laterality: N/A;  09:00 Lido 60mg ,IV followed by Propofol  70mg /IV    synched electrocardioversion at 120 joules for Afib,repeated at 200 joules, 70 mg...successfully changed to [place]  07/21/12   right hip arthroplasty   JOINT REPLACEMENT     Left hip Dr. [name] 04-07-18   TOTAL HIP ARTHROPLASTY  07/21/2012   Procedure: TOTAL HIP ARTHROPLASTY;  Surgeon: [name], MD;  Location: [facility] ORS;  Service: Orthopedics;  Laterality: Right;   TOTAL HIP ARTHROPLASTY Left 04/07/2018   Procedure: LEFT TOTAL HIP ARTHROPLASTY ANTERIOR APPROACH;  Surgeon: [name], MD;  Location: [facility] ORS;  Service: Orthopedics;  Laterality: Left;  Needs RNFA   Family Status  Relation Name  Status   Mother  Deceased   Father  Deceased   Sister  Alive   Brother  Alive   Son  (Not Specified)   Family History  Problem Relation Age of Onset   Hypertension Mother    Diabetes Mother    Alzheimer's disease Mother    Thyroid disease Mother    Diabetes Son    Social History   Socioeconomic History   Marital status: Divorced    Spouse name: [name]   Number of children: 1   Years of education: 12   Highest education level: Not on file  Occupational History   Occupation: ED ADMISSIONS     Employer: [employer]    Comment: [employer]   Tobacco Use   Smoking status: Former    Types: Cigarettes    Quit date: 09/13/1982    Years since quitting: 39.0   Smokeless tobacco: Never  Vaping Use   Vaping Use: Never used  Substance and Sexual Activity   Alcohol use: Yes    Alcohol/week: 0.0 standard drinks    Comment: Occasionally   Drug use: No   Sexual activity: Yes    Partners: Male  Other Topics Concern   Not on file  Social History Narrative   Marital Status: Married ([name])    Children:  Son [name])    Pets: None   Living Situation: Lives with [name]   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    EducationField seismologist    Tobacco Use/Exposure: Former Smoker.  She used to smoke 4 cigarettes per day for five years and quit 32 years ago.    Alcohol Use:  Occasional   Drug Use:  None   Diet:  Regular   Exercise:  Water Aerobics (2 x per week)     Hobbies: Shopping, Cycling             Social Determinants of Health   Financial Resource Strain: Not on file  Food Insecurity: Not on file  Transportation Needs: Not on file  Physical Activity: Not on file  Stress: Not on file  Social Connections: Not on file   Outpatient Medications Prior to Visit  Medication Sig   acetaminophen (TYLENOL) 650 MG CR tablet Take 650-1,300 mg by mouth every 8 (eight) hours as needed for pain.   Blood Pressure Monitor MISC Use  to check blood pressure once daily   calcium carbonate (TUMS EX) 750 MG chewable tablet Chew 1-3 tablets by mouth 3 (three) times daily as needed for heartburn.   Cholecalciferol (VITAMIN D3) 2000 units TABS Take 2,000 Units by mouth every evening.   clobetasol ointment (TEMOVATE) 0.05 %  Apply 1 application on the skin DAILY Apply daily to thick areas   desonide (DESOWEN) 0.05 % cream Apply 1 application topically 2 (two) times daily as needed (for psoriasis).    diclofenac sodium (VOLTAREN) 1 % GEL Apply 4 g topically 4 (four) times daily. (Patient taking differently: Apply 4 g topically 4 (four) times daily as needed (pain).)   flecainide (TAMBOCOR) 50 MG tablet TAKE 1 TABLET BY MOUTH TWICE DAILY **NEEDS OFFICE VISIT FOR FURTHER REFILLS**   folic acid (FOLVITE) 1 MG tablet TAKE 2 TABLETS BY MOUTH DAILY   methotrexate (RHEUMATREX) 2.5 MG tablet Take 6 tablet by mouth once a week   Multiple Vitamin (MULTIVITAMIN WITH MINERALS) TABS tablet Take 1 tablet by mouth daily.   potassium chloride (KLOR-CON) 10 MEQ tablet TAKE 2 TABLETS BY MOUTH TWICE DAILY   Propylene Glycol-Glycerin 1-0.3 % SOLN Place 1 drop into both eyes 3 (three) times daily as needed (for dry eyes).   rivaroxaban (XARELTO) 20 MG TABS tablet TAKE 1 TABLET BY MOUTH ONCE DAILY   [DISCONTINUED] BYSTOLIC 10 MG tablet Take 5 mg by mouth every evening.    [DISCONTINUED] Dulaglutide (TRULICITY) 3 OT/1.5BW SOPN Inject 0.5 mLs (3 mg total) into the skin every 7 days.   [DISCONTINUED] fluticasone (FLONASE) 50 MCG/ACT nasal spray PLACE 2 SPRAYS IN EACH NOSTRIL EVERYDAY   [DISCONTINUED] hydrochlorothiazide (HYDRODIURIL) 25 MG tablet TAKE 1 TABLET BY MOUTH EVERY MORNING   [DISCONTINUED] sertraline (ZOLOFT) 50 MG tablet Take 50 mg by mouth every evening.    [DISCONTINUED] clobetasol cream (TEMOVATE) 6.20 % Apply 1 application topically 2 (two) times daily as needed (for psoriasis). (Patient not taking: Reported on 09/10/2021)   [DISCONTINUED]  COVID-19 mRNA Vac-TriS, Pfizer, SUSP injection Inject into the muscle. (Patient not taking: Reported on 09/10/2021)   [DISCONTINUED] diazepam (VALIUM) 5 MG tablet Take one by mouth one hour prior to MRI, repeat as needed (Patient not taking: Reported on 09/10/2021)   [DISCONTINUED] nebivolol (BYSTOLIC) 10 MG tablet TAKE 1 TABLET (10 MG TOTAL) BY MOUTH DAILY. (Patient not taking: Reported on 09/10/2021)   No facility-administered medications prior to visit.   No Known Allergies  Immunization History  Administered Date(s) Administered   Influenza-Unspecified 06/30/2014, 05/17/2015, 04/13/2016, 05/16/2017, 06/15/2018, 05/16/2021   PFIZER Comirnaty(Gray Top)Covid-19 Tri-Sucrose Vaccine 05/09/2020, 02/06/2021   PFIZER(Purple Top)SARS-COV-2 Vaccination 08/24/2019, 09/07/2019, 05/09/2020, 05/12/2020   PPD Test 06/09/2016, 06/09/2016, 06/10/2017   Pneumococcal Polysaccharide-23 02/03/2018   Tdap 01/05/2018   Zoster Recombinat (Shingrix) 05/29/2019, 08/14/2019   Zoster, Live 05/29/2019, 08/14/2019    Health Maintenance  Topic Date Due   URINE MICROALBUMIN  Never done   PAP SMEAR-Modifier  08/16/2014   COLONOSCOPY (Pts 45-35yrs Insurance coverage will need to be confirmed)  12/26/2018   MAMMOGRAM  08/11/2020   TETANUS/TDAP  01/06/2028   INFLUENZA VACCINE  Completed   COVID-19 Vaccine  Completed   Hepatitis C Screening  Completed   HIV Screening  Completed   Zoster Vaccines- Shingrix  Completed   HPV VACCINES  Aged Out    Patient Care Team: Rainie Crenshaw, [name], NP as PCP - General (Nurse Practitioner) [name], MD as PCP - Cardiology (Cardiology)  Review of Systems All review of systems negative except what is listed in the HPI   Objective    BP 116/60    Pulse 78    Ht 5' 5.5" (1.664 m)    Wt 240 lb (108.9 kg)    LMP 06/19/2013    SpO2 98%    BMI  39.33 kg/m  Physical Exam Vitals and nursing note reviewed.  Constitutional:      General: She is not in acute distress.     Appearance: Normal appearance.  Eyes:     Extraocular Movements: Extraocular movements intact.     Conjunctiva/sclera: Conjunctivae normal.     Pupils: Pupils are equal, round, and reactive to light.  Neck:     Vascular: No carotid bruit.  Cardiovascular:     Rate and Rhythm: Normal rate. Rhythm irregular.     Pulses: Normal pulses.     Heart sounds: Normal heart sounds. No murmur heard. Pulmonary:     Effort: Pulmonary effort is normal.     Breath sounds: Normal breath sounds. No wheezing.  Abdominal:     General: Bowel sounds are normal. There is no distension.     Palpations: Abdomen is soft.     Tenderness: There is no abdominal tenderness. There is no guarding.  Musculoskeletal:        General: Normal range of motion.     Cervical back: Normal range of motion.     Right lower leg: Edema present.     Left lower leg: Edema present.  Skin:    General: Skin is warm and dry.     Capillary Refill: Capillary refill takes less than 2 seconds.  Neurological:     General: No focal deficit present.     Mental Status: She is alert and oriented to person, place, and time.  Psychiatric:        Mood and Affect: Mood normal.        Behavior: Behavior normal.        Thought Content: Thought content normal.        Judgment: Judgment normal.    Depression Screen PHQ 2/9 Scores 09/10/2021  PHQ - 2 Score 2  PHQ- 9 Score 6  Some encounter information is confidential and restricted. Go to Review Flowsheets activity to see all data.   No results found for any visits on 09/10/21.  Assessment & Plan      Problem List Items Addressed This Visit     Hypertension - Primary (Chronic)   Relevant Medications   hydrochlorothiazide (HYDRODIURIL) 25 MG tablet   BYSTOLIC 10 MG tablet   Other Relevant Orders   CBC with Differential/Platelet   Comprehensive metabolic panel   Lipid panel   Unilateral primary osteoarthritis, left hip   Relevant Orders   DG Bone Density   Psoriatic arthropathy  (HCC)   Relevant Orders   DG Bone Density   History of total hip replacement, right   Relevant Orders   DG Bone Density   Chronic anticoagulation   Relevant Orders   CBC with Differential/Platelet   Tinnitus   Relevant Medications   sertraline (ZOLOFT) 50 MG tablet   Atrial fibrillation (HCC)   Relevant Medications   hydrochlorothiazide (HYDRODIURIL) 25 MG tablet   BYSTOLIC 10 MG tablet   Edema   Relevant Medications   hydrochlorothiazide (HYDRODIURIL) 25 MG tablet   Morbid obesity (HCC)   Relevant Medications   Dulaglutide (TRULICITY) 3 OI/7.1IW SOPN   Other Relevant Orders   Lipid panel   Hemoglobin A1c   OSA (obstructive sleep apnea)   Avascular necrosis of hip (HCC)   Relevant Orders   DG Bone Density   Other Visit Diagnoses     Vitamin D deficiency       Relevant Orders   VITAMIN D 25 Hydroxy (Vit-D Deficiency, Fractures)  DG Bone Density   Screening for endocrine, nutritional, metabolic and immunity disorder       Relevant Orders   CBC with Differential/Platelet   Comprehensive metabolic panel   Lipid panel   TSH   VITAMIN D 25 Hydroxy (Vit-D Deficiency, Fractures)   Hemoglobin A1c   Controlled type 2 diabetes mellitus with complication, without long-term current use of insulin (HCC)       Relevant Medications   Dulaglutide (TRULICITY) 3 UP/1.0RP SOPN   Other Relevant Orders   CBC with Differential/Platelet   Comprehensive metabolic panel   Lipid panel   TSH   Hemoglobin A1c   POCT UA - Microalbumin   Allergic rhinitis, unspecified seasonality, unspecified trigger       Relevant Medications   fluticasone (FLONASE) 50 MCG/ACT nasal spray   Screening mammogram for breast cancer       Relevant Orders   MM Digital Screening   Screening for colon cancer       Relevant Orders   Ambulatory referral to Gastroenterology   Screening for osteoporosis       Relevant Orders   DG Bone Density   Mood changes       Relevant Orders   Ambulatory referral to  Psychology      Review of medical history with patient today.  We will obtain labs for evaluation and make recommendations for plan of care based on findings. Patient is out of many of her medications we will work to get restarted on these today.  She does have concern with mild depression symptoms and mood changes related to family dynamics.  We will send referral for [facility] counseling service and request female provider for her.  We will also plan to restart sertraline. Orders placed today for recommended screening.  She is up-to-date on her immunizations from what I can tell with chart review. Recommend that she continue to follow with her specialists.  We will make changes to the plan of care as needed based on lab findings and update reevaluation as needed. She will contact the office if she has any questions or concerns in the interim.  Return in about 6 months (around 03/10/2022) for HTN, DM2, HLD.      Rosario Duey, [name], NP, DNP, AGNP-C [facility] at [place]

## 2021-09-10 NOTE — Patient Instructions (Signed)
Thank you for choosing [facility] for your Primary Care needs. I am excited for the opportunity to partner with you to meet your health care goals. It was a pleasure meeting you today!  Recommendations from today's visit: We will let you know if there are any concerns with your labs.  I have sent the medication refills in for you to the [facility]. If you get home and find that you need additional refills let me know.   Information on diet, exercise, and health maintenance recommendations are listed below. This is information to help you be sure you are on track for optimal health and monitoring.   Please look over this and let us know if you have any questions or if you have completed any of the health maintenance outside of [facility] so that we can be sure your records are up to date.  ___________________________________________________________ About Me: I am an Adult-Geriatric Nurse Practitioner with a background in caring for patients for more than 20 years with a strong intensive care background. I provide primary care and sports medicine services to patients age 55 and older within this office. My education had a strong focus on caring for the older adult population, which I am passionate about. I am also the director of the APP Fellowship with [facility].   My desire is to provide you with the best service through preventive medicine and supportive care. I consider you a part of the medical team and value your input. I work diligently to ensure that you are heard and your needs are met in a safe and effective manner. I want you to feel comfortable with me as your provider and want you to know that your health concerns are important to me.  For your information, our office hours are: Monday, Tuesday, and Thursday 8:00 AM - 5:00 PM Wednesday and Friday 8:00 AM - 12:00 PM.   In my time away from the office I am teaching new APP's within the  system and am unavailable, but my partner, Dr. Burnard Bunting is in the office for emergent needs.   If you have questions or concerns, please call our office at [phone] or send [place] a MyChart message and we will respond as quickly as possible.  ____________________________________________________________ MyChart:  For all urgent or time sensitive needs we ask that you please call the office to avoid delays. Our number is [phone]. MyChart is not constantly monitored and due to the large volume of messages a day, replies may take up to 72 business hours.  MyChart Policy: MyChart allows for you to see your visit notes, after visit summary, provider recommendations, lab and tests results, make an appointment, request refills, and contact your provider or the office for non-urgent questions or concerns. Providers are seeing patients during normal business hours and do not have built in time to review MyChart messages.  We ask that you allow a minimum of 3 business days for responses to [employer]. For this reason, please do not send urgent requests through [place]. Please call the office at [phone]. New and ongoing conditions may require a visit. We have virtual and in person visit available for your convenience.  Complex MyChart concerns may require a visit. Your provider may request you schedule a virtual or in person visit to ensure we are providing the best care possible. MyChart messages sent after 11:00 AM on Friday will not be received by the provider until Monday morning.  Lab and Test Results: °You will receive your lab and test results on MyChart as soon as they are completed and results have been sent by the lab or testing facility. Due to this service, you will receive your results BEFORE your provider.  °I review lab and tests results each morning prior to seeing patients. Some results require collaboration with other providers to ensure you are receiving the most appropriate  care. For this reason, we ask that you please allow a minimum of 3-5 business days from the time the ALL results have been received for your provider to receive and review lab and test results and contact you about these.  °Most lab and test result comments from the provider will be sent through MyChart. Your provider may recommend changes to the plan of care, follow-up visits, repeat testing, ask questions, or request an office visit to discuss these results. You may reply directly to this message or call the office at [phone] to provide information for the provider or set up an appointment. °In some instances, you will be called with test results and recommendations. Please let us know if this is preferred and we will make note of this in your chart to provide this for you.    °If you have not heard a response to your lab or test results in 5 business days from all results returning to MyChart, please call the office to let us know. We ask that you please avoid calling prior to this time unless there is an emergent concern. Due to high call volumes, this can delay the resulting process. ° °After Hours: °For all non-emergency after hours needs, please call the office at [phone] and select the option to reach the on-call provider service. On-call services are shared between multiple [facility] offices and therefore it will not be possible to speak directly with your provider. On-call providers may provide medical advice and recommendations, but are unable to provide refills for maintenance medications.  °For all emergency or urgent medical needs after normal business hours, we recommend that you seek care at the closest Urgent Care or Emergency Department to ensure appropriate treatment in a timely manner.  °[facility] has a 24 hour emergency room located on the ground floor for your convenience.  ° °Urgent Concerns During the Business Day °Providers are seeing patients from 8AM to  5PM with a busy schedule and are most often not able to respond to non-urgent calls until the end of the day or the next business day. °If you should have URGENT concerns during the day, please call and speak to the nurse or schedule a same day appointment so that we can address your concern without delay.  ° °Thank you, again, for choosing me as your health care partner. I appreciate your trust and look forward to learning more about you.  ° °SaraBeth Makenley Shimp, DNP, AGNP-c °___________________________________________________________ ° °Health Maintenance Recommendations °Screening Testing °Mammogram °Every 1 -2 years based on history and risk factors °Starting at age 40 °Pap Smear °Ages 21-39 every 3 years °Ages 30-65 every 5 years with HPV testing °More frequent testing may be required based on results and history °Colon Cancer Screening °Every 1-10 years based on test performed, risk factors, and history °Starting at age 45 °Bone Density Screening °Every 2-10 years based on history °Starting at age 65 for women °Recommendations for men differ based on medication usage, history, and risk factors °AAA Screening °One time ultrasound °Men 65-75 years old who have   every smoked °Lung Cancer Screening °Low Dose Lung CT every 12 months °Age 55-80 years with a 30 pack-year smoking history who still smoke or who have quit within the last 15 years ° °Screening Labs °Routine  Labs: Complete Blood Count (CBC), Complete Metabolic Panel (CMP), Cholesterol (Lipid Panel) °Every 6-12 months based on history and medications °May be recommended more frequently based on current conditions or previous results °Hemoglobin A1c Lab °Every 3-12 months based on history and previous results °Starting at age 45 or earlier with diagnosis of diabetes, high cholesterol, BMI >26, and/or risk factors °Frequent monitoring for patients with diabetes to ensure blood sugar control °Thyroid Panel (TSH w/ T3 & T4) °Every 6 months based on history,  symptoms, and risk factors °May be repeated more often if on medication °HIV °One time testing for all patients 13 and older °May be repeated more frequently for patients with increased risk factors or exposure °Hepatitis C °One time testing for all patients 18 and older °May be repeated more frequently for patients with increased risk factors or exposure °Gonorrhea, Chlamydia °Every 12 months for all sexually active persons 13-24 years °Additional monitoring may be recommended for those who are considered high risk or who have symptoms °PSA °Men 40-54 years old with risk factors °Additional screening may be recommended from age 55-69 based on risk factors, symptoms, and history ° °Vaccine Recommendations °Tetanus Booster °All adults every 10 years °Flu Vaccine °All patients 6 months and older every year °COVID Vaccine °All patients 12 years and older °Initial dosing with booster °May recommend additional booster based on age and health history °HPV Vaccine °2 doses all patients age 9-26 °Dosing may be considered for patients over 26 °Shingles Vaccine (Shingrix) °2 doses all adults 55 years and older °Pneumonia (Pneumovax 23) °All adults 65 years and older °May recommend earlier dosing based on health history °Pneumonia (Prevnar 13) °All adults 65 years and older °Dosed 1 year after Pneumovax 23 ° °Additional Screening, Testing, and Vaccinations may be recommended on an individualized basis based on family history, health history, risk factors, and/or exposure.  °__________________________________________________________ ° °Diet Recommendations for All Patients ° °I recommend that all patients maintain a diet low in saturated fats, carbohydrates, and cholesterol. While this can be challenging at first, it is not impossible and small changes can make big differences.  °Things to try: °Decreasing the amount of soda, sweet tea, and/or juice to one or less per day and replace with water °While water is always the first  choice, if you do not like water you may consider °adding a water additive without sugar to improve the taste °other sugar free drinks °Replace potatoes with a brightly colored vegetable at dinner °Use healthy oils, such as canola oil or olive oil, instead of butter or hard margarine °Limit your bread intake to two pieces or less a day °Replace regular pasta with low carb pasta options °Bake, broil, or grill foods instead of frying °Monitor portion sizes  °Eat smaller, more frequent meals throughout the day instead of large meals ° °An important thing to remember is, if you love foods that are not great for your health, you don't have to give them up completely. Instead, allow these foods to be a reward when you have done well. Allowing yourself to still have special treats every once in a while is a nice way to tell yourself thank you for working hard to keep yourself healthy.  ° °Also remember that every day is a new day.   If you have a bad day and "fall off the wagon", you can still climb right back up and keep moving along on your journey! ° °We have resources available to help you!  °Some websites that may be helpful include: °www.MyPlate.gov  °Www.VeryWellFit.com °_____________________________________________________________ ° °Activity Recommendations for All Patients ° °I recommend that all adults get at least 20 minutes of moderate physical activity that elevates your heart rate at least 5 days out of the week.  °Some examples include: °Walking or jogging at a pace that allows you to carry on a conversation °Cycling (stationary bike or outdoors) °Water aerobics °Yoga °Weight lifting °Dancing °If physical limitations prevent you from putting stress on your joints, exercise in a pool or seated in a chair are excellent options. ° °Do determine your MAXIMUM heart rate for activity: YOUR AGE - 220 = MAX HeartRate  ° °Remember! °Do not push yourself too hard.  °Start slowly and build up your pace, speed, weight,  time in exercise, etc.  °Allow your body to rest between exercise and get good sleep. °You will need more water than normal when you are exerting yourself. Do not wait until you are thirsty to drink. Drink with a purpose of getting in at least 8, 8 ounce glasses of water a day plus more depending on how much you exercise and sweat.  ° ° °If you begin to develop dizziness, chest pain, abdominal pain, jaw pain, shortness of breath, headache, vision changes, lightheadedness, or other concerning symptoms, stop the activity and allow your body to rest. If your symptoms are severe, seek emergency evaluation immediately. If your symptoms are concerning, but not severe, please let us know so that we can recommend further evaluation.  ° ° ° °

## 2021-09-11 LAB — COMPREHENSIVE METABOLIC PANEL
ALT: 13 IU/L (ref 0–32)
AST: 14 IU/L (ref 0–40)
Albumin/Globulin Ratio: 1.5 (ref 1.2–2.2)
Albumin: 4.3 g/dL (ref 3.8–4.8)
Alkaline Phosphatase: 120 IU/L (ref 44–121)
BUN/Creatinine Ratio: 11 — ABNORMAL LOW (ref 12–28)
BUN: 9 mg/dL (ref 8–27)
Bilirubin Total: 0.6 mg/dL (ref 0.0–1.2)
CO2: 24 mmol/L (ref 20–29)
Calcium: 9.3 mg/dL (ref 8.7–10.3)
Chloride: 102 mmol/L (ref 96–106)
Creatinine, Ser: 0.84 mg/dL (ref 0.57–1.00)
Globulin, Total: 2.9 g/dL (ref 1.5–4.5)
Glucose: 77 mg/dL (ref 70–99)
Potassium: 4.6 mmol/L (ref 3.5–5.2)
Sodium: 139 mmol/L (ref 134–144)
Total Protein: 7.2 g/dL (ref 6.0–8.5)
eGFR: 78 mL/min/{1.73_m2} (ref >59)

## 2021-09-11 LAB — CBC WITH DIFFERENTIAL/PLATELET
Basophils Absolute: 0 10*3/uL (ref 0.0–0.2)
Basos: 0 %
EOS (ABSOLUTE): 0.2 10*3/uL (ref 0.0–0.4)
Eos: 3 %
Hematocrit: 36.8 % (ref 34.0–46.6)
Hemoglobin: 12.2 g/dL (ref 11.1–15.9)
Immature Grans (Abs): 0 10*3/uL (ref 0.0–0.1)
Immature Granulocytes: 0 %
Lymphocytes Absolute: 2.3 10*3/uL (ref 0.7–3.1)
Lymphs: 43 %
MCH: 29.9 pg (ref 26.6–33.0)
MCHC: 33.2 g/dL (ref 31.5–35.7)
MCV: 90 fL (ref 79–97)
Monocytes Absolute: 0.5 10*3/uL (ref 0.1–0.9)
Monocytes: 9 %
Neutrophils Absolute: 2.4 10*3/uL (ref 1.4–7.0)
Neutrophils: 45 %
Platelets: 326 10*3/uL (ref 150–450)
RBC: 4.08 x10E6/uL (ref 3.77–5.28)
RDW: 16.5 % — ABNORMAL HIGH (ref 11.7–15.4)
WBC: 5.4 10*3/uL (ref 3.4–10.8)

## 2021-09-11 LAB — TSH: TSH: 2.58 u[IU]/mL (ref 0.450–4.500)

## 2021-09-11 LAB — LIPID PANEL
Chol/HDL Ratio: 2.9 ratio (ref 0.0–4.4)
Cholesterol, Total: 134 mg/dL (ref 100–199)
HDL: 47 mg/dL (ref >39)
LDL Chol Calc (NIH): 71 mg/dL (ref 0–99)
Triglycerides: 81 mg/dL (ref 0–149)
VLDL Cholesterol Cal: 16 mg/dL (ref 5–40)

## 2021-09-11 LAB — HEMOGLOBIN A1C
Est. average glucose Bld gHb Est-mCnc: 108 mg/dL
Hgb A1c MFr Bld: 5.4 % (ref 4.8–5.6)

## 2021-09-11 LAB — VITAMIN D 25 HYDROXY (VIT D DEFICIENCY, FRACTURES): Vit D, 25-Hydroxy: 48.3 ng/mL (ref 30.0–100.0)

## 2021-09-14 ENCOUNTER — Telehealth (HOSPITAL_BASED_OUTPATIENT_CLINIC_OR_DEPARTMENT_OTHER): Payer: Self-pay

## 2021-09-14 NOTE — Telephone Encounter (Signed)
Faxed referral to [facility]

## 2021-09-15 ENCOUNTER — Encounter: Payer: Self-pay | Admitting: Cardiology

## 2021-09-15 ENCOUNTER — Other Ambulatory Visit: Payer: Self-pay

## 2021-09-15 ENCOUNTER — Ambulatory Visit (INDEPENDENT_AMBULATORY_CARE_PROVIDER_SITE_OTHER): Payer: 59 | Admitting: Cardiology

## 2021-09-15 VITALS — BP 136/77 | HR 73 | Ht 65.0 in | Wt 242.4 lb

## 2021-09-15 DIAGNOSIS — I10 Essential (primary) hypertension: Secondary | ICD-10-CM

## 2021-09-15 DIAGNOSIS — I480 Paroxysmal atrial fibrillation: Secondary | ICD-10-CM

## 2021-09-15 NOTE — Patient Instructions (Addendum)
Your physician has requested that you have an echocardiogram. Echocardiography is a painless test that uses sound waves to create images of your heart. It provides your doctor with information about the size and shape of your heart and how well your hearts chambers and valves are working. This procedure takes approximately one hour. There are no restrictions for this procedure. [place]    Follow-Up: At [facility], you and your health needs are our priority.  As part of our continuing mission to provide you with exceptional heart care, we have created designated Provider Care Teams.  These Care Teams include your primary Cardiologist (physician) and Advanced Practice Providers (APPs -  Physician Assistants and Nurse Practitioners) who all work together to provide you with the care you need, when you need it.  We recommend signing up for the patient portal called "MyChart".  Sign up information is provided on this After Visit Summary.  MyChart is used to connect with patients for Virtual Visits (Telemedicine).  Patients are able to view lab/test results, encounter notes, upcoming appointments, etc.  Non-urgent messages can be sent to your provider as well.   To learn more about what you can do with MyChart, go to [email or web address].    Your next appointment:   8 week(s)  The format for your next appointment:   In Person  Provider:   Kirk Ruths, MD

## 2021-09-16 ENCOUNTER — Telehealth: Payer: Self-pay | Admitting: Cardiology

## 2021-09-16 ENCOUNTER — Other Ambulatory Visit: Payer: Self-pay | Admitting: Cardiology

## 2021-09-16 ENCOUNTER — Other Ambulatory Visit (HOSPITAL_BASED_OUTPATIENT_CLINIC_OR_DEPARTMENT_OTHER): Payer: Self-pay

## 2021-09-16 DIAGNOSIS — I480 Paroxysmal atrial fibrillation: Secondary | ICD-10-CM

## 2021-09-16 MED ORDER — RIVAROXABAN 20 MG PO TABS
ORAL_TABLET | Freq: Every day | ORAL | 1 refills | Status: DC
  Filled 2021-09-16: qty 90, fill #0

## 2021-09-16 MED ORDER — RIVAROXABAN 20 MG PO TABS
ORAL_TABLET | Freq: Every day | ORAL | 3 refills | Status: DC
  Filled 2021-09-16: qty 90, 90d supply, fill #0

## 2021-09-16 MED ORDER — RIVAROXABAN 20 MG PO TABS
20.0000 mg | ORAL_TABLET | Freq: Every day | ORAL | 3 refills | Status: DC
  Filled 2021-09-16: qty 90, fill #0
  Filled 2021-09-16 – 2021-11-18 (×2): qty 90, 90d supply, fill #0
  Filled 2022-02-18: qty 90, 90d supply, fill #1
  Filled 2022-04-22 – 2022-05-03 (×2): qty 90, 90d supply, fill #2
  Filled 2022-08-12: qty 90, 90d supply, fill #3

## 2021-09-16 NOTE — Telephone Encounter (Signed)
Prescription refill request for Xarelto received.  Indication:Afib Last office visit:1/23 Weight:110 kg Age:66 Scr:0.8 CrCl:124.99 ml/min  Prescription refilled

## 2021-09-16 NOTE — Telephone Encounter (Signed)
Done. Thank you.

## 2021-09-16 NOTE — Telephone Encounter (Signed)
Spoke with pt, questions regarding atrial fib and possible cardioversion answered to patients satisfaction.

## 2021-09-16 NOTE — Telephone Encounter (Signed)
° ° °*  STAT* If patient is at the pharmacy, call can be transferred to refill team.   1. Which medications need to be refilled? (please list name of each medication and dose if known) rivaroxaban (XARELTO) 20 MG TABS tablet  2. Which pharmacy/location (including street and city if local pharmacy) is medication to be sent to? [facility]  3. Do they need a 30 day or 90 day supply? 90 days   Pt is out of meds, needs refill today

## 2021-09-16 NOTE — Telephone Encounter (Signed)
° °  Pt would like to ask Monica [name], if in case her echo on Friday showed she is definitely in Afib, how soon can she be scheduled for cardioversion?

## 2021-09-16 NOTE — Telephone Encounter (Signed)
Forwarded xarelto refill request to anticoag clinic at [facility].

## 2021-09-18 ENCOUNTER — Ambulatory Visit (INDEPENDENT_AMBULATORY_CARE_PROVIDER_SITE_OTHER): Payer: 59

## 2021-09-18 ENCOUNTER — Other Ambulatory Visit: Payer: Self-pay

## 2021-09-18 DIAGNOSIS — I480 Paroxysmal atrial fibrillation: Secondary | ICD-10-CM | POA: Diagnosis not present

## 2021-09-18 LAB — ECHOCARDIOGRAM COMPLETE
AR max vel: 1.8 cm2
AV Area VTI: 1.82 cm2
AV Area mean vel: 1.9 cm2
AV Mean grad: 4 mmHg
AV Peak grad: 8.2 mmHg
Ao pk vel: 1.43 m/s
Area-P 1/2: 4.36 cm2
Calc EF: 54.6 %
MV M vel: 4.92 m/s
MV Peak grad: 96.8 mmHg
S' Lateral: 3.49 cm
Single Plane A2C EF: 57.4 %
Single Plane A4C EF: 50.5 %

## 2021-09-21 ENCOUNTER — Other Ambulatory Visit (HOSPITAL_BASED_OUTPATIENT_CLINIC_OR_DEPARTMENT_OTHER): Payer: Self-pay

## 2021-09-23 ENCOUNTER — Encounter: Payer: Self-pay | Admitting: *Deleted

## 2021-09-29 ENCOUNTER — Telehealth (HOSPITAL_BASED_OUTPATIENT_CLINIC_OR_DEPARTMENT_OTHER): Payer: Self-pay

## 2021-10-07 ENCOUNTER — Other Ambulatory Visit (HOSPITAL_BASED_OUTPATIENT_CLINIC_OR_DEPARTMENT_OTHER): Payer: Self-pay

## 2021-10-07 MED FILL — Flecainide Acetate Tab 50 MG: ORAL | 90 days supply | Qty: 180 | Fill #2 | Status: AC

## 2021-10-15 ENCOUNTER — Other Ambulatory Visit (HOSPITAL_BASED_OUTPATIENT_CLINIC_OR_DEPARTMENT_OTHER): Payer: Self-pay

## 2021-10-20 ENCOUNTER — Other Ambulatory Visit (HOSPITAL_BASED_OUTPATIENT_CLINIC_OR_DEPARTMENT_OTHER): Payer: Self-pay

## 2021-10-20 ENCOUNTER — Ambulatory Visit (HOSPITAL_BASED_OUTPATIENT_CLINIC_OR_DEPARTMENT_OTHER)
Admission: RE | Admit: 2021-10-20 | Discharge: 2021-10-20 | Disposition: A | Discharge status: Home / Self Care | Payer: 59 | Source: Ambulatory Visit | Attending: Nurse Practitioner | Admitting: Nurse Practitioner

## 2021-10-20 ENCOUNTER — Encounter (HOSPITAL_BASED_OUTPATIENT_CLINIC_OR_DEPARTMENT_OTHER): Payer: Self-pay

## 2021-10-20 ENCOUNTER — Other Ambulatory Visit: Payer: Self-pay

## 2021-10-20 DIAGNOSIS — Z1231 Encounter for screening mammogram for malignant neoplasm of breast: Secondary | ICD-10-CM | POA: Insufficient documentation

## 2021-10-26 ENCOUNTER — Other Ambulatory Visit (HOSPITAL_COMMUNITY): Payer: Self-pay

## 2021-11-16 DIAGNOSIS — L400 Psoriasis vulgaris: Secondary | ICD-10-CM | POA: Diagnosis not present

## 2021-11-18 ENCOUNTER — Other Ambulatory Visit (HOSPITAL_BASED_OUTPATIENT_CLINIC_OR_DEPARTMENT_OTHER): Payer: Self-pay

## 2021-11-19 ENCOUNTER — Other Ambulatory Visit (HOSPITAL_BASED_OUTPATIENT_CLINIC_OR_DEPARTMENT_OTHER): Payer: Self-pay

## 2021-11-20 ENCOUNTER — Other Ambulatory Visit (HOSPITAL_BASED_OUTPATIENT_CLINIC_OR_DEPARTMENT_OTHER): Payer: Self-pay

## 2021-11-23 ENCOUNTER — Other Ambulatory Visit (HOSPITAL_BASED_OUTPATIENT_CLINIC_OR_DEPARTMENT_OTHER): Payer: Self-pay

## 2021-11-24 ENCOUNTER — Other Ambulatory Visit (HOSPITAL_BASED_OUTPATIENT_CLINIC_OR_DEPARTMENT_OTHER): Payer: Self-pay

## 2021-11-24 DIAGNOSIS — H524 Presbyopia: Secondary | ICD-10-CM | POA: Diagnosis not present

## 2021-11-24 DIAGNOSIS — E119 Type 2 diabetes mellitus without complications: Secondary | ICD-10-CM | POA: Diagnosis not present

## 2021-11-25 ENCOUNTER — Other Ambulatory Visit (HOSPITAL_BASED_OUTPATIENT_CLINIC_OR_DEPARTMENT_OTHER): Payer: Self-pay

## 2021-12-03 NOTE — Progress Notes (Signed)
? ? ? ? ?HPI:FU atrial fibrillation. Patient underwent cardioversion on 01/09/2013 to sinus rhythm. Patient developed recurrent atrial fibrillation and had a repeat cardioversion successfully on flecainide. Follow-up exercise treadmill on flecanide did not show significant arrhythmia.  Venous Dopplers December 2022 showed no DVT.  Echocardiogram repeated February 2023 and showed normal LV function, severe left atrial enlargement, moderate right atrial enlargement, mild mitral regurgitation, mild to moderate tricuspid regurgitation.  At last office visit patient noted to be in recurrent atrial fibrillation.  Since I last saw her, she does have some increased fatigue.  She denies dyspnea, palpitations, chest pain or syncope.  No bleeding.  She has missed no doses of Xarelto. ? ?Current Outpatient Medications  ?Medication Sig Dispense Refill  ? acetaminophen (TYLENOL) 650 MG CR tablet Take 650-1,300 mg by mouth every 8 (eight) hours as needed for pain.    ? Blood Pressure Monitor MISC Use to check blood pressure once daily 1 each 0  ? BYSTOLIC 10 MG tablet Take 0.5 tablets (5 mg total) by mouth every evening. 45 tablet 3  ? calcium carbonate (TUMS EX) 750 MG chewable tablet Chew 1-3 tablets by mouth 3 (three) times daily as needed for heartburn.    ? Cholecalciferol (VITAMIN D3) 2000 units TABS Take 2,000 Units by mouth every evening.    ? clobetasol ointment (TEMOVATE) 5.68 % Apply 1 application on the skin DAILY Apply daily to thick areas 60 g 0  ? desonide (DESOWEN) 0.05 % cream Apply 1 application topically 2 (two) times daily as needed (for psoriasis).   0  ? diclofenac sodium (VOLTAREN) 1 % GEL Apply 4 g topically 4 (four) times daily. (Patient taking differently: Apply 4 g topically 4 (four) times daily as needed (pain).) 5 Tube 1  ? Dulaglutide (TRULICITY) 3 LE/7.5TZ SOPN Inject 0.5 mLs (3 mg total) into the skin every 7 days. 2 mL 5  ? flecainide (TAMBOCOR) 50 MG tablet TAKE 1 TABLET BY MOUTH TWICE DAILY  **NEEDS OFFICE VISIT FOR FURTHER REFILLS** 180 tablet 3  ? fluticasone (FLONASE) 50 MCG/ACT nasal spray PLACE 2 SPRAYS IN EACH NOSTRIL EVERY DAY 16 g 3  ? folic acid (FOLVITE) 1 MG tablet TAKE 2 TABLETS BY MOUTH DAILY 60 tablet 3  ? hydrochlorothiazide (HYDRODIURIL) 25 MG tablet Take 1 tablet (25 mg total) by mouth every morning. 90 tablet 3  ? methotrexate (RHEUMATREX) 2.5 MG tablet Take 6 tablet by mouth once a week 24 tablet 5  ? Multiple Vitamin (MULTIVITAMIN WITH MINERALS) TABS tablet Take 1 tablet by mouth daily.    ? potassium chloride (KLOR-CON) 10 MEQ tablet TAKE 2 TABLETS BY MOUTH TWICE DAILY 360 tablet 3  ? Propylene Glycol-Glycerin 1-0.3 % SOLN Place 1 drop into both eyes 3 (three) times daily as needed (for dry eyes).    ? rivaroxaban (XARELTO) 20 MG TABS tablet Take 1 tablet (20 mg total) by mouth daily. 90 tablet 3  ? sertraline (ZOLOFT) 50 MG tablet Take 1 tablet (50 mg total) by mouth at bedtime. 90 tablet 3  ? ?No current facility-administered medications for this visit.  ? ? ? ?Past Medical History:  ?Diagnosis Date  ? Atrial fibrillation ([place])   ? a. s/p DCCV in 12/2012 b. repeat DCCV in 09/2014 - started on Flecainide  ? Avascular necrosis of bone of right hip ([place])   ? Dysrhythmia   ? a fib  ? Hypertension   ? Morbid obesity ([place])   ? Osteoarthritis   ? Personal history  of colonic polyps - large hyperplastic 12/25/2013  ? Pre-diabetes   ? Psoriasis   ? active breakout left buttocks  ? Right hip pain   ? Shortness of breath   ? Sleep apnea   ? uses mouth guard only  ? Tachycardia, unspecified   ? ? ?Past Surgical History:  ?Procedure Laterality Date  ? CARDIOVERSION N/A 01/09/2013  ? Procedure: CARDIOVERSION;  Surgeon: [name], MD;  Location: [facility];  Service: Cardiovascular;  Laterality: N/A;  ? CARDIOVERSION N/A 10/07/2014  ? Procedure: CARDIOVERSION;  Surgeon: [name], MD;  Location: [facility];  Service: Cardiovascular;  Laterality: N/A;  09:00 Lido '60mg'$ ,IV followed by  Propofol  '70mg'$ /IV    synched electrocardioversion at 120 joules for Afib,repeated at 200 joules, 70 mg...successfully changed to SR  ? FRACTURE SURGERY  07/21/12  ? right hip arthroplasty  ? JOINT REPLACEMENT    ? Left hip Dr. [name] 04-07-18  ? TOTAL HIP ARTHROPLASTY  07/21/2012  ? Procedure: TOTAL HIP ARTHROPLASTY;  Surgeon: [name], MD;  Location: [facility];  Service: Orthopedics;  Laterality: Right;  ? TOTAL HIP ARTHROPLASTY Left 04/07/2018  ? Procedure: LEFT TOTAL HIP ARTHROPLASTY ANTERIOR APPROACH;  Surgeon: [name], MD;  Location: [facility];  Service: Orthopedics;  Laterality: Left;  Needs RNFA  ? ? ?Social History  ? ?Socioeconomic History  ? Marital status: Divorced  ?  Spouse name: [name]  ? Number of children: 1  ? Years of education: 60  ? Highest education level: Not on file  ?Occupational History  ? Occupation: ED ADMISSIONS   ?  Employer: [employer]  ?  Comment: [employer]   ?Tobacco Use  ? Smoking status: Former  ?  Types: Cigarettes  ?  Quit date: 09/13/1982  ?  Years since quitting: 39.2  ? Smokeless tobacco: Never  ?Vaping Use  ? Vaping Use: Never used  ?Substance and Sexual Activity  ? Alcohol use: Yes  ?  Alcohol/week: 0.0 standard drinks  ?  Comment: Occasionally  ? Drug use: No  ? Sexual activity: Yes  ?  Partners: Male  ?Other Topics Concern  ? Not on file  ?Social History Narrative  ? Marital Status: Married [name])   ? Children:  Son [place])   ? Pets: None  ? Living Situation: Lives with [name]  ? 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   ? EducationField seismologist   ? Tobacco Use/Exposure: Former Smoker.  She used to smoke 4 cigarettes per day for five years and quit 32 years ago.   ? Alcohol Use:  Occasional  ? Drug Use:  None  ? Diet:  Regular  ? Exercise:  Water Aerobics (2 x per week)    ? Hobbies: Shopping, Cycling   ?   ?   ?   ? ?Social Determinants of Health  ? ?Financial Resource Strain: Not on file  ?Food  Insecurity: Not on file  ?Transportation Needs: Not on file  ?Physical Activity: Not on file  ?Stress: Not on file  ?Social Connections: Not on file  ?Intimate Partner Violence: Not on file  ? ? ?Family History  ?Problem Relation Age of Onset  ? Hypertension Mother   ? Diabetes Mother   ? Alzheimer's disease Mother   ? Thyroid disease Mother   ? Diabetes Son   ? ? ?ROS: no fevers or chills, productive cough, hemoptysis, dysphasia, odynophagia, melena, hematochezia, dysuria, hematuria, rash,  seizure activity, orthopnea, PND, pedal edema, claudication. Remaining systems are negative. ? ?Physical Exam: ?Well-developed well-nourished in no acute distress.  ?Skin is warm and dry.  ?HEENT is normal.  ?Neck is supple.  ?Chest is clear to auscultation with normal expansion.  ?Cardiovascular exam is irregular ?Abdominal exam nontender or distended. No masses palpated. ?Extremities show no edema. ?neuro grossly intact ? ?ECG-atrial fibrillation with PVCs or aberrantly conducted beats, nonspecific ST changes.  Personally reviewed ? ?A/P ? ?1 persistent atrial fibrillation-at last office visit patient was noted to be in recurrent atrial fibrillation.  Continue Bystolic for rate control.  Continue Xarelto.  I will increase flecainide to 100 mg twice daily.  She has missed no doses of Xarelto.  We will plan to proceed with elective cardioversion as she has increased fatigue when in atrial fibrillation.  If she does not hold sinus rhythm then will refer to atrial fibrillation clinic for consideration of ablation versus a different antiarrhythmic such as Tikosyn.  Also note I will plan to proceed with exercise treadmill in approximately 4 weeks given the increased dose of flecainide (rule out exercise-induced VT). ? ?2 hypertension-patient's blood pressure is controlled today.  Continue present medications. ? ?3 obesity-continue efforts at weight loss. ? ?4 edema-continue diuretic. ? ?Kirk Ruths, MD ? ? ? ?

## 2021-12-03 NOTE — H&P (View-Only) (Signed)
? ? ? ? ?HPI:FU atrial fibrillation. Patient underwent cardioversion on 01/09/2013 to sinus rhythm. Patient developed recurrent atrial fibrillation and had a repeat cardioversion successfully on flecainide. Follow-up exercise treadmill on flecanide did not show significant arrhythmia.  Venous Dopplers December 2022 showed no DVT.  Echocardiogram repeated February 2023 and showed normal LV function, severe left atrial enlargement, moderate right atrial enlargement, mild mitral regurgitation, mild to moderate tricuspid regurgitation.  At last office visit patient noted to be in recurrent atrial fibrillation.  Since I last saw her, she does have some increased fatigue.  She denies dyspnea, palpitations, chest pain or syncope.  No bleeding.  She has missed no doses of Xarelto. ? ?Current Outpatient Medications  ?Medication Sig Dispense Refill  ? acetaminophen (TYLENOL) 650 MG CR tablet Take 650-1,300 mg by mouth every 8 (eight) hours as needed for pain.    ? Blood Pressure Monitor MISC Use to check blood pressure once daily 1 each 0  ? BYSTOLIC 10 MG tablet Take 0.5 tablets (5 mg total) by mouth every evening. 45 tablet 3  ? calcium carbonate (TUMS EX) 750 MG chewable tablet Chew 1-3 tablets by mouth 3 (three) times daily as needed for heartburn.    ? Cholecalciferol (VITAMIN D3) 2000 units TABS Take 2,000 Units by mouth every evening.    ? clobetasol ointment (TEMOVATE) 9.50 % Apply 1 application on the skin DAILY Apply daily to thick areas 60 g 0  ? desonide (DESOWEN) 0.05 % cream Apply 1 application topically 2 (two) times daily as needed (for psoriasis).   0  ? diclofenac sodium (VOLTAREN) 1 % GEL Apply 4 g topically 4 (four) times daily. (Patient taking differently: Apply 4 g topically 4 (four) times daily as needed (pain).) 5 Tube 1  ? Dulaglutide (TRULICITY) 3 DT/2.6ZT SOPN Inject 0.5 mLs (3 mg total) into the skin every 7 days. 2 mL 5  ? flecainide (TAMBOCOR) 50 MG tablet TAKE 1 TABLET BY MOUTH TWICE DAILY  **NEEDS OFFICE VISIT FOR FURTHER REFILLS** 180 tablet 3  ? fluticasone (FLONASE) 50 MCG/ACT nasal spray PLACE 2 SPRAYS IN EACH NOSTRIL EVERY DAY 16 g 3  ? folic acid (FOLVITE) 1 MG tablet TAKE 2 TABLETS BY MOUTH DAILY 60 tablet 3  ? hydrochlorothiazide (HYDRODIURIL) 25 MG tablet Take 1 tablet (25 mg total) by mouth every morning. 90 tablet 3  ? methotrexate (RHEUMATREX) 2.5 MG tablet Take 6 tablet by mouth once a week 24 tablet 5  ? Multiple Vitamin (MULTIVITAMIN WITH MINERALS) TABS tablet Take 1 tablet by mouth daily.    ? potassium chloride (KLOR-CON) 10 MEQ tablet TAKE 2 TABLETS BY MOUTH TWICE DAILY 360 tablet 3  ? Propylene Glycol-Glycerin 1-0.3 % SOLN Place 1 drop into both eyes 3 (three) times daily as needed (for dry eyes).    ? rivaroxaban (XARELTO) 20 MG TABS tablet Take 1 tablet (20 mg total) by mouth daily. 90 tablet 3  ? sertraline (ZOLOFT) 50 MG tablet Take 1 tablet (50 mg total) by mouth at bedtime. 90 tablet 3  ? ?No current facility-administered medications for this visit.  ? ? ? ?Past Medical History:  ?Diagnosis Date  ? Atrial fibrillation ([place])   ? a. s/p DCCV in 12/2012 b. repeat DCCV in 09/2014 - started on Flecainide  ? Avascular necrosis of bone of right hip ([place])   ? Dysrhythmia   ? a fib  ? Hypertension   ? Morbid obesity ([place])   ? Osteoarthritis   ? Personal history  of colonic polyps - large hyperplastic 12/25/2013  ? Pre-diabetes   ? Psoriasis   ? active breakout left buttocks  ? Right hip pain   ? Shortness of breath   ? Sleep apnea   ? uses mouth guard only  ? Tachycardia, unspecified   ? ? ?Past Surgical History:  ?Procedure Laterality Date  ? CARDIOVERSION N/A 01/09/2013  ? Procedure: CARDIOVERSION;  Surgeon: [name], MD;  Location: [facility];  Service: Cardiovascular;  Laterality: N/A;  ? CARDIOVERSION N/A 10/07/2014  ? Procedure: CARDIOVERSION;  Surgeon: [name], MD;  Location: [facility] ENDOSCOPY;  Service: Cardiovascular;  Laterality: N/A;  09:00 Lido '60mg'$ ,IV followed by  Propofol  '70mg'$ /IV    synched electrocardioversion at 120 joules for Afib,repeated at 200 joules, 70 mg...successfully changed to SR  ? FRACTURE SURGERY  07/21/12  ? right hip arthroplasty  ? JOINT REPLACEMENT    ? Left hip Dr. [name] 04-07-18  ? TOTAL HIP ARTHROPLASTY  07/21/2012  ? Procedure: TOTAL HIP ARTHROPLASTY;  Surgeon: [name], MD;  Location: [facility];  Service: Orthopedics;  Laterality: Right;  ? TOTAL HIP ARTHROPLASTY Left 04/07/2018  ? Procedure: LEFT TOTAL HIP ARTHROPLASTY ANTERIOR APPROACH;  Surgeon: [name], MD;  Location: [facility];  Service: Orthopedics;  Laterality: Left;  Needs RNFA  ? ? ?Social History  ? ?Socioeconomic History  ? Marital status: Divorced  ?  Spouse name: [name]  ? Number of children: 1  ? Years of education: 39  ? Highest education level: Not on file  ?Occupational History  ? Occupation: ED ADMISSIONS   ?  Employer: [employer]  ?  Comment: [employer]   ?Tobacco Use  ? Smoking status: Former  ?  Types: Cigarettes  ?  Quit date: 09/13/1982  ?  Years since quitting: 39.2  ? Smokeless tobacco: Never  ?Vaping Use  ? Vaping Use: Never used  ?Substance and Sexual Activity  ? Alcohol use: Yes  ?  Alcohol/week: 0.0 standard drinks  ?  Comment: Occasionally  ? Drug use: No  ? Sexual activity: Yes  ?  Partners: Male  ?Other Topics Concern  ? Not on file  ?Social History Narrative  ? Marital Status: Married [name])   ? Children:  Son [place] Liter)   ? Pets: None  ? Living Situation: Lives with [name]  ? 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   ? EducationField seismologist   ? Tobacco Use/Exposure: Former Smoker.  She used to smoke 4 cigarettes per day for five years and quit 32 years ago.   ? Alcohol Use:  Occasional  ? Drug Use:  None  ? Diet:  Regular  ? Exercise:  Water Aerobics (2 x per week)    ? Hobbies: Shopping, Cycling   ?   ?   ?   ? ?Social Determinants of Health  ? ?Financial Resource Strain: Not on file  ?Food  Insecurity: Not on file  ?Transportation Needs: Not on file  ?Physical Activity: Not on file  ?Stress: Not on file  ?Social Connections: Not on file  ?Intimate Partner Violence: Not on file  ? ? ?Family History  ?Problem Relation Age of Onset  ? Hypertension Mother   ? Diabetes Mother   ? Alzheimer's disease Mother   ? Thyroid disease Mother   ? Diabetes Son   ? ? ?ROS: no fevers or chills, productive cough, hemoptysis, dysphasia, odynophagia, melena, hematochezia, dysuria, hematuria, rash,  seizure activity, orthopnea, PND, pedal edema, claudication. Remaining systems are negative. ? ?Physical Exam: ?Well-developed well-nourished in no acute distress.  ?Skin is warm and dry.  ?HEENT is normal.  ?Neck is supple.  ?Chest is clear to auscultation with normal expansion.  ?Cardiovascular exam is irregular ?Abdominal exam nontender or distended. No masses palpated. ?Extremities show no edema. ?neuro grossly intact ? ?ECG-atrial fibrillation with PVCs or aberrantly conducted beats, nonspecific ST changes.  Personally reviewed ? ?A/P ? ?1 persistent atrial fibrillation-at last office visit patient was noted to be in recurrent atrial fibrillation.  Continue Bystolic for rate control.  Continue Xarelto.  I will increase flecainide to 100 mg twice daily.  She has missed no doses of Xarelto.  We will plan to proceed with elective cardioversion as she has increased fatigue when in atrial fibrillation.  If she does not hold sinus rhythm then will refer to atrial fibrillation clinic for consideration of ablation versus a different antiarrhythmic such as Tikosyn.  Also note I will plan to proceed with exercise treadmill in approximately 4 weeks given the increased dose of flecainide (rule out exercise-induced VT). ? ?2 hypertension-patient's blood pressure is controlled today.  Continue present medications. ? ?3 obesity-continue efforts at weight loss. ? ?4 edema-continue diuretic. ? ?Kirk Ruths, MD ? ? ? ?

## 2021-12-08 ENCOUNTER — Ambulatory Visit (INDEPENDENT_AMBULATORY_CARE_PROVIDER_SITE_OTHER): Payer: 59 | Admitting: Cardiology

## 2021-12-08 ENCOUNTER — Encounter: Payer: Self-pay | Admitting: Cardiology

## 2021-12-08 ENCOUNTER — Other Ambulatory Visit (HOSPITAL_BASED_OUTPATIENT_CLINIC_OR_DEPARTMENT_OTHER): Payer: Self-pay

## 2021-12-08 VITALS — BP 132/88 | HR 86 | Ht 65.5 in | Wt 238.0 lb

## 2021-12-08 DIAGNOSIS — I10 Essential (primary) hypertension: Secondary | ICD-10-CM

## 2021-12-08 DIAGNOSIS — I4819 Other persistent atrial fibrillation: Secondary | ICD-10-CM

## 2021-12-08 MED ORDER — FLECAINIDE ACETATE 100 MG PO TABS
100.0000 mg | ORAL_TABLET | Freq: Two times a day (BID) | ORAL | 3 refills | Status: DC
  Filled 2021-12-08: qty 180, 90d supply, fill #0

## 2021-12-08 NOTE — Patient Instructions (Signed)
Medication Instructions:  ? ?INCREASE FLECAINIDE TO 100 MG TWICE DAILY= 2 OF THE 50 MG TABLETS TWICE DAILY ? ?*If you need a refill on your cardiac medications before your next appointment, please call your pharmacy* ? ? ?Testing/Procedures: ? ?Your physician has requested that you have an exercise tolerance test. For further information please visit [email or web address]. Please also follow instruction sheet, as given. [facility] ? ? ? ?You are scheduled for a Cardioversion on Tuesday MAY 9 TH with Dr. Gardiner Rhyme.  Please arrive at the [facility] (Main Entrance A) at [facility]: [address] [place], [place] [address] at 9:30 AM (1 hour prior to procedure unless lab work is needed; if lab work is needed arrive 1.5 hours ahead) ? ?DIET: Nothing to eat or drink after midnight except a sip of water with medications (see medication instructions below) ? ?FYI: For your safety, and to allow [place] to monitor your vital signs accurately during the surgery/procedure we request that   ?if you have artificial nails, gel coating, SNS etc. Please have those removed prior to your surgery/procedure. Not having the nail coverings /polish removed may result in cancellation or delay of your surgery/procedure. ? ? ?Medication Instructions: ? ?TAKE ALL MORNING MEDICATIONS WITH SIPS OF WATER THE MORNING OF THE PROCEDURE ? ?Continue your anticoagulant: XARELTO ? ? ?You must have a responsible person to drive you home and stay in the waiting area during your procedure. Failure to do so could result in cancellation. ? ?Interior and spatial designer cards. ? ?*Special Note: Every effort is made to have your procedure done on time. Occasionally there are emergencies that occur at the hospital that may cause delays. Please be patient if a delay does occur.   ? ? ?Follow-Up: ?At [facility], you and your health needs are our priority.  As part of our continuing mission to provide you with exceptional heart care, we have created  designated Provider Care Teams.  These Care Teams include your primary Cardiologist (physician) and Advanced Practice Providers (APPs -  Physician Assistants and Nurse Practitioners) who all work together to provide you with the care you need, when you need it. ? ?We recommend signing up for the patient portal called "MyChart".  Sign up information is provided on this After Visit Summary.  MyChart is used to connect with patients for Virtual Visits (Telemedicine).  Patients are able to view lab/test results, encounter notes, upcoming appointments, etc.  Non-urgent messages can be sent to your provider as well.   ?To learn more about what you can do with MyChart, go to [email or web address].   ? ?Your next appointment:   ?3 month(s) ? ?The format for your next appointment:   ?In Person ? ?Provider:   ?Kirk Ruths, MD   ? ? ? ? ?Important Information About Sugar ? ? ? ? ?  ?

## 2021-12-09 ENCOUNTER — Other Ambulatory Visit (HOSPITAL_BASED_OUTPATIENT_CLINIC_OR_DEPARTMENT_OTHER): Payer: Self-pay

## 2021-12-10 ENCOUNTER — Other Ambulatory Visit (HOSPITAL_BASED_OUTPATIENT_CLINIC_OR_DEPARTMENT_OTHER): Payer: Self-pay

## 2021-12-10 MED ORDER — COVID-19 AT HOME ANTIGEN TEST VI KIT
PACK | 0 refills | Status: DC
  Filled 2021-12-10: qty 8, 8d supply, fill #0

## 2021-12-14 ENCOUNTER — Other Ambulatory Visit (HOSPITAL_BASED_OUTPATIENT_CLINIC_OR_DEPARTMENT_OTHER): Payer: Self-pay

## 2021-12-15 ENCOUNTER — Other Ambulatory Visit (HOSPITAL_BASED_OUTPATIENT_CLINIC_OR_DEPARTMENT_OTHER): Payer: Self-pay

## 2021-12-16 ENCOUNTER — Encounter (HOSPITAL_COMMUNITY): Payer: Self-pay | Admitting: Cardiology

## 2021-12-16 ENCOUNTER — Other Ambulatory Visit (HOSPITAL_BASED_OUTPATIENT_CLINIC_OR_DEPARTMENT_OTHER): Payer: Self-pay

## 2021-12-16 DIAGNOSIS — L400 Psoriasis vulgaris: Secondary | ICD-10-CM | POA: Diagnosis not present

## 2021-12-16 MED ORDER — METHOTREXATE 2.5 MG PO TABS
ORAL_TABLET | ORAL | 3 refills | Status: DC
  Filled 2021-12-16: qty 24, 28d supply, fill #0
  Filled 2022-01-14: qty 24, 28d supply, fill #1
  Filled 2022-02-09: qty 24, 28d supply, fill #2
  Filled 2022-03-11: qty 24, 28d supply, fill #3

## 2021-12-16 NOTE — Progress Notes (Signed)
Attempted to obtain medical history via telephone, unable to reach at this time. I left a voicemail to return pre surgical testing department's phone call.  

## 2021-12-22 ENCOUNTER — Encounter (HOSPITAL_COMMUNITY)
Admission: RE | Disposition: A | Discharge status: Home / Self Care | Payer: Self-pay | Source: Ambulatory Visit | Attending: Cardiology

## 2021-12-22 ENCOUNTER — Ambulatory Visit (HOSPITAL_BASED_OUTPATIENT_CLINIC_OR_DEPARTMENT_OTHER): Payer: 59 | Admitting: Certified Registered Nurse Anesthetist

## 2021-12-22 ENCOUNTER — Ambulatory Visit (HOSPITAL_COMMUNITY)
Admission: RE | Admit: 2021-12-22 | Discharge: 2021-12-22 | Disposition: A | Discharge status: Home / Self Care | Payer: 59 | Source: Ambulatory Visit | Attending: Cardiology | Admitting: Cardiology

## 2021-12-22 ENCOUNTER — Encounter (HOSPITAL_COMMUNITY): Payer: Self-pay | Admitting: Cardiology

## 2021-12-22 ENCOUNTER — Ambulatory Visit (HOSPITAL_COMMUNITY): Payer: 59 | Admitting: Certified Registered Nurse Anesthetist

## 2021-12-22 ENCOUNTER — Other Ambulatory Visit: Payer: Self-pay

## 2021-12-22 DIAGNOSIS — Z7901 Long term (current) use of anticoagulants: Secondary | ICD-10-CM | POA: Insufficient documentation

## 2021-12-22 DIAGNOSIS — E669 Obesity, unspecified: Secondary | ICD-10-CM | POA: Diagnosis not present

## 2021-12-22 DIAGNOSIS — E119 Type 2 diabetes mellitus without complications: Secondary | ICD-10-CM

## 2021-12-22 DIAGNOSIS — R609 Edema, unspecified: Secondary | ICD-10-CM | POA: Insufficient documentation

## 2021-12-22 DIAGNOSIS — I10 Essential (primary) hypertension: Secondary | ICD-10-CM

## 2021-12-22 DIAGNOSIS — Z87891 Personal history of nicotine dependence: Secondary | ICD-10-CM | POA: Diagnosis not present

## 2021-12-22 DIAGNOSIS — I4819 Other persistent atrial fibrillation: Secondary | ICD-10-CM | POA: Diagnosis not present

## 2021-12-22 DIAGNOSIS — Z6839 Body mass index (BMI) 39.0-39.9, adult: Secondary | ICD-10-CM | POA: Diagnosis not present

## 2021-12-22 DIAGNOSIS — G4730 Sleep apnea, unspecified: Secondary | ICD-10-CM | POA: Diagnosis not present

## 2021-12-22 DIAGNOSIS — I4891 Unspecified atrial fibrillation: Secondary | ICD-10-CM | POA: Diagnosis not present

## 2021-12-22 HISTORY — PX: CARDIOVERSION: SHX1299

## 2021-12-22 LAB — POCT I-STAT, CHEM 8
BUN: 25 mg/dL — ABNORMAL HIGH (ref 8–23)
Calcium, Ion: 1.16 mmol/L (ref 1.15–1.40)
Chloride: 103 mmol/L (ref 98–111)
Creatinine, Ser: 1.2 mg/dL — ABNORMAL HIGH (ref 0.44–1.00)
Glucose, Bld: 113 mg/dL — ABNORMAL HIGH (ref 70–99)
HCT: 39 % (ref 36.0–46.0)
Hemoglobin: 13.3 g/dL (ref 12.0–15.0)
Potassium: 5 mmol/L (ref 3.5–5.1)
Sodium: 140 mmol/L (ref 135–145)
TCO2: 33 mmol/L — ABNORMAL HIGH (ref 22–32)

## 2021-12-22 SURGERY — CARDIOVERSION
Anesthesia: General

## 2021-12-22 MED ORDER — LIDOCAINE 2% (20 MG/ML) 5 ML SYRINGE
INTRAMUSCULAR | Status: DC | PRN
  Administered 2021-12-22: 60 mg via INTRAVENOUS

## 2021-12-22 MED ORDER — SODIUM CHLORIDE 0.9 % IV SOLN: INTRAVENOUS | Status: DC

## 2021-12-22 MED ORDER — PROPOFOL 10 MG/ML IV BOLUS
INTRAVENOUS | Status: DC | PRN
  Administered 2021-12-22: 70 mg via INTRAVENOUS

## 2021-12-22 MED ORDER — SODIUM CHLORIDE 0.9 % IV SOLN
INTRAVENOUS | Status: AC | PRN
  Administered 2021-12-22: 500 mL via INTRAVENOUS

## 2021-12-22 NOTE — Anesthesia Preprocedure Evaluation (Addendum)
Anesthesia Evaluation  ?Patient identified by MRN, date of birth, ID band ?Patient awake ? ? ? ?Reviewed: ?Allergy & Precautions, NPO status , Patient's Chart, lab work & pertinent test results ? ?History of Anesthesia Complications ?Negative for: history of anesthetic complications ? ?Airway ?Mallampati: II ? ?TM Distance: >3 FB ?Neck ROM: Full ? ? ? Dental ? ?(+) Missing,  ?  ?Pulmonary ?sleep apnea , former smoker,  ?  ?Pulmonary exam normal ? ? ? ? ? ? ? Cardiovascular ?hypertension, Pt. on medications ?Normal cardiovascular exam+ dysrhythmias Atrial Fibrillation  ? ?TTE 09/2021: EF 55-60%, mild RVE, severe LAE, moderate RAE, mild MR, mild to moderate TR ?  ?Neuro/Psych ? Headaches, Depression   ? GI/Hepatic ?Neg liver ROS, GERD  Controlled,  ?Endo/Other  ?diabetes, Type 2 ? Renal/GU ?negative Renal ROS  ?negative genitourinary ?  ?Musculoskeletal ? ?(+) Arthritis ,  ? Abdominal ?  ?Peds ? Hematology ?negative hematology ROS ?(+)   ?Anesthesia Other Findings ?Day of surgery medications reviewed with patient. ? Reproductive/Obstetrics ?negative OB ROS ? ?  ? ? ? ? ? ? ? ? ? ? ? ? ? ?  ?  ? ? ? ? ? ? ? ?Anesthesia Physical ?Anesthesia Plan ? ?ASA: 3 ? ?Anesthesia Plan: General  ? ?Post-op Pain Management: Minimal or no pain anticipated  ? ?Induction: Intravenous ? ?PONV Risk Score and Plan: Treatment may vary due to age or medical condition and Propofol infusion ? ?Airway Management Planned: Mask ? ?Additional Equipment: None ? ?Intra-op Plan:  ? ?Post-operative Plan:  ? ?Informed Consent: I have reviewed the patients History and Physical, chart, labs and discussed the procedure including the risks, benefits and alternatives for the proposed anesthesia with the patient or authorized representative who has indicated his/her understanding and acceptance.  ? ? ? ? ? ?Plan Discussed with: CRNA ? ?Anesthesia Plan Comments:   ? ? ? ? ? ?Anesthesia Quick Evaluation ? ?

## 2021-12-22 NOTE — Transfer of Care (Signed)
Immediate Anesthesia Transfer of Care Note ? ?Patient: Monica [name] ? ?Procedure(s) Performed: CARDIOVERSION ? ?Patient Location: Endoscopy Unit ? ?Anesthesia Type:General ? ?Level of Consciousness: drowsy and patient cooperative ? ?Airway & Oxygen Therapy: Patient Spontanous Breathing ? ?Post-op Assessment: Report given to RN and Post -op Vital signs reviewed and stable ? ?Post vital signs: Reviewed and stable ? ?Last Vitals:  ?Vitals Value Taken Time  ?BP 147/86   ?Temp    ?Pulse 74   ?Resp 14   ?SpO2 97%   ? ? ?Last Pain:  ?Vitals:  ? 12/22/21 0931  ?TempSrc: Temporal  ?PainSc: 0-No pain  ?   ? ?  ? ?Complications: No notable events documented. ?

## 2021-12-22 NOTE — Interval H&P Note (Signed)
History and Physical Interval Note: ? ?12/22/2021 ?10:28 AM ? ?Monica [name]  has presented today for surgery, with the diagnosis of AFIB.  The various methods of treatment have been discussed with the patient and family. After consideration of risks, benefits and other options for treatment, the patient has consented to  Procedure(s): ?CARDIOVERSION (N/A) as a surgical intervention.  The patient's history has been reviewed, patient examined, no change in status, stable for surgery.  I have reviewed the patient's chart and labs.  Questions were answered to the patient's satisfaction.   ? ? ?Donato Heinz ? ? ?

## 2021-12-22 NOTE — CV Procedure (Signed)
Procedure:   DCCV ? ?Indication:  Symptomatic atrial fibrillation ? ?Procedure Note:  The patient signed informed consent.  They have had had therapeutic anticoagulation with Xarelto greater than 3 weeks.  Anesthesia was administered by Dr. Dr Daiva Huge and Juanda Chance, CRNA .  Adequate airway was maintained throughout and vital followed per protocol.  They were cardioverted x 1 with 200J of biphasic synchronized energy.  They converted to NSR with rate 70s.  There were no apparent complications.  The patient had normal neuro status and respiratory status post procedure with vitals stable as recorded elsewhere.   ? ?Follow up:  They will continue on current medical therapy and follow up with cardiology as scheduled. ? ?Oswaldo Milian, MD ?12/22/2021 ?10:36 AM  ? ?

## 2021-12-22 NOTE — Discharge Instructions (Signed)

## 2021-12-22 NOTE — Anesthesia Postprocedure Evaluation (Signed)
Anesthesia Post Note ? ?Patient: Monica [name] ? ?Procedure(s) Performed: CARDIOVERSION ? ?  ? ?Patient location during evaluation: PACU ?Anesthesia Type: General ?Level of consciousness: awake and alert ?Pain management: pain level controlled ?Vital Signs Assessment: post-procedure vital signs reviewed and stable ?Respiratory status: spontaneous breathing, nonlabored ventilation and respiratory function stable ?Cardiovascular status: blood pressure returned to baseline ?Postop Assessment: no apparent nausea or vomiting ?Anesthetic complications: no ? ? ?No notable events documented. ? ?Last Vitals:  ?Vitals:  ? 12/22/21 1050 12/22/21 1058  ?BP: 139/89 (!) 145/78  ?Pulse: 72 74  ?Resp: 14 17  ?Temp:    ?SpO2: 98% 97%  ?  ?Last Pain:  ?Vitals:  ? 12/22/21 1050  ?TempSrc:   ?PainSc: 0-No pain  ? ? ?  ?  ?  ?  ?  ?  ? ?Marthenia Rolling ? ? ? ? ?

## 2021-12-24 ENCOUNTER — Encounter (HOSPITAL_COMMUNITY): Payer: Self-pay | Admitting: Cardiology

## 2021-12-24 ENCOUNTER — Other Ambulatory Visit (HOSPITAL_BASED_OUTPATIENT_CLINIC_OR_DEPARTMENT_OTHER): Payer: Self-pay

## 2022-01-08 ENCOUNTER — Telehealth (HOSPITAL_COMMUNITY): Payer: Self-pay | Admitting: *Deleted

## 2022-01-08 NOTE — Telephone Encounter (Signed)
Close encounter 

## 2022-01-12 ENCOUNTER — Ambulatory Visit (HOSPITAL_COMMUNITY)
Admission: RE | Admit: 2022-01-12 | Discharge: 2022-01-12 | Disposition: A | Discharge status: Home / Self Care | Payer: 59 | Source: Ambulatory Visit | Attending: Cardiology | Admitting: Cardiology

## 2022-01-12 DIAGNOSIS — I4819 Other persistent atrial fibrillation: Secondary | ICD-10-CM

## 2022-01-13 ENCOUNTER — Telehealth: Payer: Self-pay | Admitting: *Deleted

## 2022-01-13 NOTE — Telephone Encounter (Signed)
Left message for pt to call, her GXT was cancelled yesterday because she was back in atrial fib. The gxt was for flecainide. Nmeed to find out how she is doing. She has a follow up appointment with dr Stanford Breed in July and he is fine with that appointment but need to she how she is doing.

## 2022-01-14 ENCOUNTER — Other Ambulatory Visit (HOSPITAL_BASED_OUTPATIENT_CLINIC_OR_DEPARTMENT_OTHER): Payer: Self-pay

## 2022-01-15 ENCOUNTER — Other Ambulatory Visit (HOSPITAL_BASED_OUTPATIENT_CLINIC_OR_DEPARTMENT_OTHER): Payer: Self-pay

## 2022-01-15 MED ORDER — FOLIC ACID 1 MG PO TABS
ORAL_TABLET | ORAL | 3 refills | Status: DC
  Filled 2022-01-15: qty 60, 30d supply, fill #0
  Filled 2022-06-05: qty 60, 30d supply, fill #1

## 2022-01-27 ENCOUNTER — Encounter: Payer: Self-pay | Admitting: Cardiology

## 2022-01-27 ENCOUNTER — Other Ambulatory Visit (HOSPITAL_BASED_OUTPATIENT_CLINIC_OR_DEPARTMENT_OTHER): Payer: Self-pay

## 2022-01-27 NOTE — Telephone Encounter (Signed)
Pt returning nurse's call. Please advise

## 2022-01-28 ENCOUNTER — Other Ambulatory Visit (HOSPITAL_BASED_OUTPATIENT_CLINIC_OR_DEPARTMENT_OTHER): Payer: Self-pay

## 2022-01-29 NOTE — Telephone Encounter (Signed)
See mychart message to patient.

## 2022-02-10 ENCOUNTER — Other Ambulatory Visit (HOSPITAL_BASED_OUTPATIENT_CLINIC_OR_DEPARTMENT_OTHER): Payer: Self-pay

## 2022-02-18 ENCOUNTER — Other Ambulatory Visit (HOSPITAL_BASED_OUTPATIENT_CLINIC_OR_DEPARTMENT_OTHER): Payer: Self-pay

## 2022-03-11 ENCOUNTER — Ambulatory Visit (HOSPITAL_BASED_OUTPATIENT_CLINIC_OR_DEPARTMENT_OTHER): Payer: 59 | Admitting: Nurse Practitioner

## 2022-03-11 ENCOUNTER — Other Ambulatory Visit (HOSPITAL_BASED_OUTPATIENT_CLINIC_OR_DEPARTMENT_OTHER): Payer: Self-pay

## 2022-03-12 ENCOUNTER — Ambulatory Visit: Payer: 59 | Admitting: Cardiology

## 2022-03-15 ENCOUNTER — Other Ambulatory Visit (HOSPITAL_BASED_OUTPATIENT_CLINIC_OR_DEPARTMENT_OTHER): Payer: Self-pay

## 2022-03-22 ENCOUNTER — Other Ambulatory Visit (HOSPITAL_BASED_OUTPATIENT_CLINIC_OR_DEPARTMENT_OTHER): Payer: Self-pay

## 2022-03-25 ENCOUNTER — Other Ambulatory Visit (HOSPITAL_BASED_OUTPATIENT_CLINIC_OR_DEPARTMENT_OTHER): Payer: Self-pay

## 2022-03-25 ENCOUNTER — Encounter (HOSPITAL_BASED_OUTPATIENT_CLINIC_OR_DEPARTMENT_OTHER): Payer: Self-pay | Admitting: Nurse Practitioner

## 2022-03-25 ENCOUNTER — Ambulatory Visit (INDEPENDENT_AMBULATORY_CARE_PROVIDER_SITE_OTHER): Payer: 59 | Admitting: Nurse Practitioner

## 2022-03-25 VITALS — BP 123/95 | HR 68 | Temp 98.7°F | Ht 65.0 in | Wt 238.0 lb

## 2022-03-25 DIAGNOSIS — H9313 Tinnitus, bilateral: Secondary | ICD-10-CM

## 2022-03-25 DIAGNOSIS — R609 Edema, unspecified: Secondary | ICD-10-CM

## 2022-03-25 DIAGNOSIS — Z0000 Encounter for general adult medical examination without abnormal findings: Secondary | ICD-10-CM

## 2022-03-25 DIAGNOSIS — Z1211 Encounter for screening for malignant neoplasm of colon: Secondary | ICD-10-CM | POA: Diagnosis not present

## 2022-03-25 DIAGNOSIS — E118 Type 2 diabetes mellitus with unspecified complications: Secondary | ICD-10-CM

## 2022-03-25 DIAGNOSIS — E1159 Type 2 diabetes mellitus with other circulatory complications: Secondary | ICD-10-CM

## 2022-03-25 DIAGNOSIS — I4811 Longstanding persistent atrial fibrillation: Secondary | ICD-10-CM

## 2022-03-25 DIAGNOSIS — I152 Hypertension secondary to endocrine disorders: Secondary | ICD-10-CM

## 2022-03-25 DIAGNOSIS — I4819 Other persistent atrial fibrillation: Secondary | ICD-10-CM

## 2022-03-25 DIAGNOSIS — D6869 Other thrombophilia: Secondary | ICD-10-CM

## 2022-03-25 DIAGNOSIS — E1169 Type 2 diabetes mellitus with other specified complication: Secondary | ICD-10-CM

## 2022-03-25 DIAGNOSIS — J309 Allergic rhinitis, unspecified: Secondary | ICD-10-CM

## 2022-03-25 MED ORDER — TRULICITY 3 MG/0.5ML ~~LOC~~ SOAJ
SUBCUTANEOUS | 5 refills | Status: DC
  Filled 2022-03-25 – 2022-04-07 (×2): qty 2, 28d supply, fill #0
  Filled 2022-05-24: qty 2, 28d supply, fill #1
  Filled 2022-06-30: qty 2, 28d supply, fill #2
  Filled 2022-07-26: qty 2, 28d supply, fill #3

## 2022-03-25 MED ORDER — HYDROCHLOROTHIAZIDE 25 MG PO TABS
25.0000 mg | ORAL_TABLET | Freq: Every morning | ORAL | 3 refills | Status: DC
  Filled 2022-03-25 – 2022-05-12 (×3): qty 90, 90d supply, fill #0
  Filled 2022-08-03: qty 90, 90d supply, fill #1

## 2022-03-25 MED ORDER — FLECAINIDE ACETATE 100 MG PO TABS
100.0000 mg | ORAL_TABLET | Freq: Two times a day (BID) | ORAL | 3 refills | Status: DC
  Filled 2022-03-25: qty 14, 7d supply, fill #0
  Filled 2022-03-26: qty 166, 83d supply, fill #0
  Filled 2022-07-05: qty 66, 33d supply, fill #1
  Filled 2022-07-06: qty 114, 57d supply, fill #1
  Filled 2022-10-04: qty 180, 90d supply, fill #2
  Filled 2023-01-20: qty 180, 90d supply, fill #3

## 2022-03-25 MED ORDER — SERTRALINE HCL 50 MG PO TABS
75.0000 mg | ORAL_TABLET | Freq: Every day | ORAL | 3 refills | Status: DC
  Filled 2022-03-25: qty 145, 96d supply, fill #0
  Filled 2022-06-14: qty 135, 90d supply, fill #0

## 2022-03-25 MED ORDER — FLUTICASONE PROPIONATE 50 MCG/ACT NA SUSP
NASAL | 3 refills | Status: DC
  Filled 2022-03-25: qty 16, 30d supply, fill #0

## 2022-03-25 NOTE — Patient Instructions (Addendum)
I want you to let me know if you have problems getting into the counselor.   Keep me posted on your heart.   I want you to start taking 1.5 tablets of your Sertraline to make it '75mg'$ .

## 2022-03-25 NOTE — Progress Notes (Signed)
BP (!) 123/95   Pulse 68   Temp 98.7 F (37.1 C)   Ht 5' 5"  (1.651 m)   Wt 238 lb (108 kg)   LMP 06/19/2013   SpO2 99%   BMI 39.61 kg/m    Subjective:    Patient ID: Monica [name], female    DOB: [birth year], 66 y.o.   MRN: [id number]  HPI: Monica [name] is a 66 y.o. female presenting on 03/25/2022 for comprehensive medical examination.   She reports regular vision exams q1-5y: no She reports regular dental exams q 21m no Her diet consists of:  diabetic  She endorses exercise and/or activity of:  none consistent She works in:  healthcare  She endorses ETOH use ( social 1-2/month ) She denies nictoine use  She denies illegal substance use   She is post menopausal with no vaginal bleeding.  She denies concerns today about STI  She denies concerns about skin changes today  She denies concerns about bowel changes today  She denies concerns about bladder changes today   Most Recent Depression Screen:     09/10/2021   11:46 AM 03/12/2015    4:35 PM  Depression screen PHQ 2/9  Decreased Interest 1   Down, Depressed, Hopeless 1   PHQ - 2 Score 2   Altered sleeping 1   Tired, decreased energy 1   Change in appetite 1   Feeling bad or failure about yourself  1   Trouble concentrating 0   Moving slowly or fidgety/restless 0   Suicidal thoughts 0   PHQ-9 Score 6   Difficult doing work/chores Somewhat difficult      Information is confidential and restricted. Go to Review Flowsheets to unlock data.   Most Recent Anxiety Screen:     09/10/2021   11:47 AM  GAD 7 : Generalized Anxiety Score  Nervous, Anxious, on Edge 1  Control/stop worrying 1  Worry too much - different things 1  Trouble relaxing 0  Restless 0  Easily annoyed or irritable 0  Afraid - awful might happen 0  Total GAD 7 Score 3  Anxiety Difficulty Somewhat difficult   Most Recent Fall Screen:    09/10/2021   11:46 AM  Fall Risk   Falls in the past year? 0  Number falls in past yr: 0  Injury  with Fall? 0  Risk for fall due to : No Fall Risks  Follow up Falls evaluation completed    All ROS negative except what is listed above and in the HPI.   Past medical history, surgical history, medications, allergies, family history and social history reviewed with patient today and changes made to appropriate areas of the chart.  Past Medical History:  Past Medical History:  Diagnosis Date   Allergic conjunctivitis 03/12/2015   Atrial fibrillation ([place]    a. s/p DCCV in 12/2012 b. repeat DCCV in 09/2014 - started on Flecainide   Avascular necrosis of bone of right hip ([facility]    Backache 06/05/2013   Candidiasis of female genitalia 10/14/2013   Chronic anticoagulation 11/19/2016   Cough 06/20/2013   Dysrhythmia    a fib   Essential hypertension, benign 11/30/2012   Headache(784.0) 11/30/2012   History of total hip replacement, right 11/17/2017   History of AVN   Hypertension    IFG (impaired fasting glucose) 10/14/2013   Itchy eyes 03/12/2015   Morbid obesity ([facility]    Osteoarthritis    Personal history of colonic polyps - large  hyperplastic 12/25/2013   Pre-diabetes    Psoriasis    active breakout left buttocks   Right hip pain    Right knee pain 07/06/2016   Shortness of breath    Skin infection 10/14/2013   Sleep apnea    uses mouth guard only   Swelling of limb 11/30/2012   Tachycardia, unspecified    Medications:  Current Outpatient Medications on File Prior to Visit  Medication Sig   acetaminophen (TYLENOL) 650 MG CR tablet Take 650-1,300 mg by mouth every 8 (eight) hours as needed for pain.   Blood Pressure Monitor MISC Use to check blood pressure once daily   BYSTOLIC 10 MG tablet Take 0.5 tablets (5 mg total) by mouth every evening.   calcium carbonate (TUMS EX) 750 MG chewable tablet Chew 1-3 tablets by mouth 3 (three) times daily as needed for heartburn.   Cholecalciferol (VITAMIN D3) 2000 units TABS Take 2,000 Units by mouth every evening.   clobetasol ointment  (TEMOVATE) 9.93 % Apply 1 application on the skin DAILY Apply daily to thick areas   COVID-19 At Home Antigen Test (CARESTART COVID-19 HOME TEST) KIT Use as directed per package instructions   desonide (DESOWEN) 0.05 % cream Apply 1 application topically 2 (two) times daily as needed (for psoriasis).    diclofenac sodium (VOLTAREN) 1 % GEL Apply 4 g topically 4 (four) times daily. (Patient taking differently: Apply 4 g topically 4 (four) times daily as needed (pain).)   folic acid (FOLVITE) 1 MG tablet TAKE 2 TABLETS BY MOUTH DAILY   methotrexate (RHEUMATREX) 2.5 MG tablet Take 6 tablet by mouth once a week   methotrexate (RHEUMATREX) 2.5 MG tablet Take 6 tablets by mouth once a week   Multiple Vitamin (MULTIVITAMIN WITH MINERALS) TABS tablet Take 1 tablet by mouth daily.   potassium chloride (KLOR-CON) 10 MEQ tablet TAKE 2 TABLETS BY MOUTH TWICE DAILY   Propylene Glycol-Glycerin 1-0.3 % SOLN Place 1 drop into both eyes 3 (three) times daily as needed (for dry eyes).   rivaroxaban (XARELTO) 20 MG TABS tablet Take 1 tablet (20 mg total) by mouth daily.   No current facility-administered medications on file prior to visit.   Surgical History:  Past Surgical History:  Procedure Laterality Date   CARDIOVERSION N/A 01/09/2013   Procedure: CARDIOVERSION;  Surgeon: [name], MD;  Location[facility] ENDOSCOPY;  Service: Cardiovascular;  Laterality: N/A;   CARDIOVERSION N/A 10/07/2014   Procedure: CARDIOVERSION;  Surgeon: [name], MD;  Location: [facility] ENDOSCOPY;  Service: Cardiovascular;  Laterality: N/A;  09:00 Lido 62m,IV followed by Propofol  753mIV    synched electrocardioversion at 120 joules for Afib,repeated at 200 joules, 70 mg...[place]fully changed to [place]/A 12/22/2021   Procedure: CARDIOVERSION;  Surgeon: ScDonato HeinzMD;  [facility];  Service: Cardiovascular;  Laterality: N/A;   FRACTURE SURGERY  07/21/12   right hip arthroplasty   JOINT  REPLACEMENT     Left hip Dr. BlNinfa Linden-23-19   TOTAL HIP ARTHROPLASTY  07/21/2012   Procedure: TOTAL HIP ARTHROPLASTY;  Surgeon: FrGearlean AlfMD;  Location: [facility] ORS;  Service: Orthopedics;  Laterality: Right;   TOTAL HIP ARTHROPLASTY Left 04/07/2018   Procedure: LEFT TOTAL HIP ARTHROPLASTY ANTERIOR APPROACH;  Surgeon: BlMcarthur RossettiMD;  Location: [facility] ORS;  Service: Orthopedics;  Laterality: Left;  Needs RNFA   Allergies:  No Known Allergies Social History:  Social History   Socioeconomic History   Marital status: Married  Spouse name: [name]   Number of children: 1   Years of education: 12   Highest education level: Not on file  Occupational History   Occupation: ED ADMISSIONS     Employer: [employer]    Comment: [employer]   Tobacco Use   Smoking status: Former    Types: Cigarettes    Quit date: 09/13/1982    Years since quitting: 39.5   Smokeless tobacco: Never  Vaping Use   Vaping Use: Never used  Substance and Sexual Activity   Alcohol use: Yes    Alcohol/week: 0.0 standard drinks of alcohol    Comment: Occasionally   Drug use: No   Sexual activity: Yes    Partners: Male  Other Topics Concern   Not on file  Social History Narrative   Marital Status: Married ([name])    Children:  Son [name])    Pets: None   Living Situation: Lives with [name]   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    EducationField seismologist    Tobacco Use/Exposure: Former Smoker.  She used to smoke 4 cigarettes per day for five years and quit 32 years ago.    Alcohol Use:  Occasional   Drug Use:  None   Diet:  Regular   Exercise:  Water Aerobics (2 x per week)     Hobbies: Shopping, Cycling             Social Determinants of Health   Financial Resource Strain: Low Risk  (03/30/2018)   Overall Financial Resource Strain (CARDIA)    Difficulty of Paying Living Expenses: Not hard at all  Food Insecurity: Unknown  (03/30/2018)   Hunger Vital Sign    Worried About Running Out of Food in the Last Year: Patient refused    [place] in the Last Year: Patient refused  Transportation Needs: Unknown (03/30/2018)   PRAPARE - Transportation    Lack of Transportation (Medical): Patient refused    Lack of Transportation (Non-Medical): Patient refused  Physical Activity: Unknown (03/30/2018)   Exercise Vital Sign    Days of Exercise per Week: Patient refused    Minutes of Exercise per Session: Patient refused  Stress: Not on file  Social Connections: Unknown (03/30/2018)   Social Connection and Isolation Panel [NHANES]    Frequency of Communication with Friends and Family: Patient refused    Frequency of Social Gatherings with Friends and Family: Patient refused    Attends Religious Services: Patient refused    Active Member of Clubs or Organizations: Patient refused    Attends Archivist Meetings: Patient refused    Marital Status: Patient refused  Intimate Partner Violence: Unknown (03/30/2018)   Humiliation, Afraid, Rape, and Kick questionnaire    Fear of Current or Ex-Partner: Patient refused    Emotionally Abused: Patient refused    Physically Abused: Patient refused    Sexually Abused: Patient refused   Social History   Tobacco Use  Smoking Status Former   Types: Cigarettes   Quit date: 09/13/1982   Years since quitting: 39.5  Smokeless Tobacco Never   Social History   Substance and Sexual Activity  Alcohol Use Yes   Alcohol/week: 0.0 standard drinks of alcohol   Comment: Occasionally   Family History:  Family History  Problem Relation Age of Onset   Hypertension Mother    Diabetes Mother    Alzheimer's disease Mother    Thyroid disease Mother  Diabetes Son        Objective:    BP (!) 123/95   Pulse 68   Temp 98.7 F (37.1 C)   Ht 5' 5"  (1.651 m)   Wt 238 lb (108 kg)   LMP 06/19/2013   SpO2 99%   BMI 39.61 kg/m   Wt Readings from Last 3 Encounters:   03/25/22 238 lb (108 kg)  12/22/21 238 lb 1.6 oz (108 kg)  12/08/21 238 lb (108 kg)    Physical Exam Vitals and nursing note reviewed.  Constitutional:      General: She is not in acute distress.    Appearance: Normal appearance.  HENT:     Head: Normocephalic and atraumatic.     Right Ear: Hearing, tympanic membrane, ear canal and external ear normal.     Left Ear: Hearing, tympanic membrane, ear canal and external ear normal.     Nose: Nose normal.     Right Sinus: No maxillary sinus tenderness or frontal sinus tenderness.     Left Sinus: No maxillary sinus tenderness or frontal sinus tenderness.     Mouth/Throat:     Lips: Pink.     Mouth: Mucous membranes are moist.     Pharynx: Oropharynx is clear.  Eyes:     General: Lids are normal. Vision grossly intact.     Extraocular Movements: Extraocular movements intact.     Conjunctiva/sclera: Conjunctivae normal.     Pupils: Pupils are equal, round, and reactive to light.     Funduscopic exam:    Right eye: Red reflex present.        Left eye: Red reflex present.    Visual Fields: Right eye visual fields normal and left eye visual fields normal.  Neck:     Thyroid: No thyromegaly.     Vascular: No carotid bruit.  Cardiovascular:     Rate and Rhythm: Normal rate. Rhythm irregular.     Chest Wall: PMI is not displaced.     Pulses: Normal pulses.          Dorsalis pedis pulses are 2+ on the right side and 2+ on the left side.       Posterior tibial pulses are 2+ on the right side and 2+ on the left side.     Heart sounds: Normal heart sounds. No murmur heard. Pulmonary:     Effort: Pulmonary effort is normal. No respiratory distress.     Breath sounds: Normal breath sounds.  Abdominal:     General: Abdomen is flat. Bowel sounds are normal. There is no distension.     Palpations: Abdomen is soft. There is no hepatomegaly, splenomegaly or mass.     Tenderness: There is no abdominal tenderness. There is no right CVA  tenderness, left CVA tenderness, guarding or rebound.  Musculoskeletal:        General: Normal range of motion.     Cervical back: Full passive range of motion without pain, normal range of motion and neck supple. No tenderness.     Right lower leg: Edema present.     Left lower leg: Edema present.  Feet:     Left foot:     Toenail Condition: Left toenails are normal.  Lymphadenopathy:     Cervical: No cervical adenopathy.     Upper Body:     Right upper body: No supraclavicular adenopathy.     Left upper body: No supraclavicular adenopathy.  Skin:    General: Skin is warm and  dry.     Capillary Refill: Capillary refill takes less than 2 seconds.     Nails: There is no clubbing.  Neurological:     General: No focal deficit present.     Mental Status: She is alert and oriented to person, place, and time.     GCS: GCS eye subscore is 4. GCS verbal subscore is 5. GCS motor subscore is 6.     Sensory: Sensation is intact.     Motor: Motor function is intact.     Coordination: Coordination is intact.     Gait: Gait is intact.     Deep Tendon Reflexes: Reflexes are normal and symmetric.  Psychiatric:        Attention and Perception: Attention normal.        Mood and Affect: Mood normal.        Speech: Speech normal.        Behavior: Behavior normal. Behavior is cooperative.        Thought Content: Thought content normal.        Cognition and Memory: Cognition and memory normal.        Judgment: Judgment normal.     Results for orders placed or performed in visit on 03/25/22  CBC With Diff/Platelet  Result Value Ref Range   WBC 5.6 3.4 - 10.8 x10E3/uL   RBC 3.60 (L) 3.77 - 5.28 x10E6/uL   Hemoglobin 11.3 11.1 - 15.9 g/dL   Hematocrit 34.3 34.0 - 46.6 %   MCV 95 79 - 97 fL   MCH 31.4 26.6 - 33.0 pg   MCHC 32.9 31.5 - 35.7 g/dL   RDW 14.6 11.7 - 15.4 %   Platelets 332 150 - 450 x10E3/uL   Neutrophils 53 Not Estab. %   Lymphs 34 Not Estab. %   Monocytes 9 Not Estab. %   Eos  3 Not Estab. %   Basos 0 Not Estab. %   Neutrophils Absolute 2.9 1.4 - 7.0 x10E3/uL   Lymphocytes Absolute 1.9 0.7 - 3.1 x10E3/uL   Monocytes Absolute 0.5 0.1 - 0.9 x10E3/uL   EOS (ABSOLUTE) 0.2 0.0 - 0.4 x10E3/uL   Basophils Absolute 0.0 0.0 - 0.2 x10E3/uL   Immature Granulocytes 1 Not Estab. %   Immature Grans (Abs) 0.0 0.0 - 0.1 x10E3/uL  Comprehensive metabolic panel  Result Value Ref Range   Glucose 66 (L) 70 - 99 mg/dL   BUN 8 8 - 27 mg/dL   Creatinine, Ser 0.84 0.57 - 1.00 mg/dL   eGFR 78 >59 mL/min/1.73   BUN/Creatinine Ratio 10 (L) 12 - 28   Sodium 136 134 - 144 mmol/L   Potassium 6.2 (H) 3.5 - 5.2 mmol/L   Chloride 98 96 - 106 mmol/L   CO2 23 20 - 29 mmol/L   Calcium 9.2 8.7 - 10.3 mg/dL   Total Protein 7.5 6.0 - 8.5 g/dL   Albumin 4.6 3.9 - 4.9 g/dL   Globulin, Total 2.9 1.5 - 4.5 g/dL   Albumin/Globulin Ratio 1.6 1.2 - 2.2   Bilirubin Total 0.5 0.0 - 1.2 mg/dL   Alkaline Phosphatase 137 (H) 44 - 121 IU/L   AST 18 0 - 40 IU/L   ALT 23 0 - 32 IU/L  LP+LDL Direct  Result Value Ref Range   Cholesterol, Total 146 100 - 199 mg/dL   Triglycerides 86 0 - 149 mg/dL   HDL 48 >39 mg/dL   VLDL Cholesterol Cal 16 5 - 40 mg/dL   LDL Chol  Calc (NIH) 82 0 - 99 mg/dL   LDL Direct 84 0 - 99 mg/dL  VITAMIN D 25 Hydroxy (Vit-D Deficiency, Fractures)  Result Value Ref Range   Vit D, 25-Hydroxy 37.2 30.0 - 100.0 ng/mL  Iron, TIBC and Ferritin Panel  Result Value Ref Range   Total Iron Binding Capacity 327 250 - 450 ug/dL   UIBC 258 118 - 369 ug/dL   Iron 69 27 - 139 ug/dL   Iron Saturation 21 15 - 55 %   Ferritin 112 15 - 150 ng/mL  Hemoglobin A1c  Result Value Ref Range   Hgb A1c MFr Bld 5.1 4.8 - 5.6 %   Est. average glucose Bld gHb Est-mCnc 100 mg/dL  POCT UA - Microalbumin  Result Value Ref Range   Microalbumin Ur, POC 10 mg/L   Creatinine, POC 100 mg/dL   Albumin/Creatinine Ratio, Urine, POC <30       Assessment & Plan:   Problem List Items Addressed This Visit      Hypertension associated with diabetes ([place])    Chronic. BP elevated slightly at visit today- encourage daily use of medications. Will monitor closely. If BP remains elevated when medication is taken, please notify the office for further evaluation and recommendations. No alarm sx present at this time.       Relevant Medications   flecainide (TAMBOCOR) 100 MG tablet   Dulaglutide (TRULICITY) 3 VF/6.4PP SOPN   hydrochlorothiazide (HYDRODIURIL) 25 MG tablet   Atrial fibrillation (HCC)    Chronic. Stable. No alarm sx present at this time. Chronic anticoagulation in place. Will monitor closely.       Relevant Medications   flecainide (TAMBOCOR) 100 MG tablet   hydrochlorothiazide (HYDRODIURIL) 25 MG tablet   Edema    Chronic. Will check labs today. Continue current medications. Increase walking and utilize compression stockings for management with elevation of LE while seated.       Relevant Medications   hydrochlorothiazide (HYDRODIURIL) 25 MG tablet   Allergic rhinitis    Chronic. Refills provided.       Relevant Medications   fluticasone (FLONASE) 50 MCG/ACT nasal spray   Tinnitus    Chronic. Recommend fluticasone nasal spray for presence of clear effusion.       Relevant Medications   sertraline (ZOLOFT) 50 MG tablet   DM2 (diabetes mellitus, type 2) (HCC)    Chronic. Will obtain labs today for monitoring. No alarm sx present at this time. Continue Trulicity for management.       Relevant Medications   Dulaglutide (TRULICITY) 3 IR/5.1OA SOPN   Encounter for annual physical exam - Primary    CPE today with discussion of chronic disease management. Labs today. HM recommendations discussed. Orders placed for activities due. Will need vision examination for DM.       Hypercoagulable state due to longstanding persistent atrial fibrillation (HCC)    Chronic. Stable. Labs today. No alarm sx present at this time.       Relevant Medications   flecainide (TAMBOCOR) 100 MG  tablet   hydrochlorothiazide (HYDRODIURIL) 25 MG tablet   Other Visit Diagnoses     Healthcare maintenance       Relevant Orders   CBC With Diff/Platelet (Completed)   Comprehensive metabolic panel (Completed)   LP+LDL Direct (Completed)   VITAMIN D 25 Hydroxy (Vit-D Deficiency, Fractures) (Completed)   Iron, TIBC and Ferritin Panel (Completed)   Hemoglobin A1c (Completed)   POCT UA - Microalbumin (Completed)   Screening for colon  cancer       Relevant Orders   Cologuard         IMMUNIZATIONS:   - Tdap: Tetanus vaccination status reviewed: last tetanus booster within 10 years. - Influenza: Postponed to flu season - Pneumovax: Up to date - Prevnar: Up to date - HPV: Not applicable - Zostavax vaccine: Up to date  SCREENING: - Pap smear: done elsewhere- need records for update HM - STI testing: deferred -Mammogram: Up to date  - Colonoscopy:  cologuard ordered today   - Bone Density: Not applicable  -Hearing Test: Not applicable  -Spirometry: Not applicable   Follow up plan: Return in about 6 months (around 09/25/2022) for DM, HTN.  NEXT PREVENTATIVE PHYSICAL DUE IN 1 YEAR.  PATIENT COUNSELING PROVIDED:   For all adult patients, I recommend   A well balanced diet low in saturated fats, cholesterol, and moderation in carbohydrates.   This can be as simple as monitoring portion sizes and cutting back on sugary beverages such as soda and juice to start with.    Daily water consumption of at least 64 ounces.  Physical activity at least 180 minutes per week, if just starting out.   This can be as simple as taking the stairs instead of the elevator and walking 2-3 laps around the office  purposefully every day.   STD protection, partner selection, and regular testing if high risk.  Limited consumption of alcoholic beverages if alcohol is consumed.  For women, I recommend no more than 7 alcoholic beverages per week, spread out throughout the week.  Avoid "binge"  drinking or consuming large quantities of alcohol in one setting.   Please let me know if you feel you may need help with reduction or quitting alcohol consumption.   Avoidance of nicotine, if used.  Please let me know if you feel you may need help with reduction or quitting nicotine use.   Daily mental health attention.  This can be in the form of 5 minute daily meditation, prayer, journaling, yoga, reflection, etc.   Purposeful attention to your emotions and mental state can significantly improve your overall wellbeing and Health.  Please know that I am here to help you with all of your health care goals and am happy to work with you to find a solution that works best for you.  The greatest advice I have received with any changes in life are to take it one step at a time, that even means if all you can focus on is the next 60 seconds, then do that and celebrate your victories.  With any changes in life, you will have set backs, and that is OK. The important thing to remember is, if you have a set back, it is not a failure, it is an opportunity to try again!  Health Maintenance Recommendations Screening Testing Mammogram Every 1 -2 years based on history and risk factors Starting at age 63 Pap Smear Ages 21-39 every 3 years Ages 59-65 every 5 years with HPV testing More frequent testing may be required based on results and history Colon Cancer Screening Every 1-10 years based on test performed, risk factors, and history Starting at age 68 Bone Density Screening Every 2-10 years based on history Starting at age 46 for women Recommendations for men differ based on medication usage, history, and risk factors AAA Screening One time ultrasound Men 11-35 years old who have every smoked Lung Cancer Screening Low Dose Lung CT every 12 months Age 4-80 years  with a 30 pack-year smoking history who still smoke or who have quit within the last 15 years  Screening Labs Routine  Labs:  Complete Blood Count (CBC), Complete Metabolic Panel (CMP), Cholesterol (Lipid Panel) Every 6-12 months based on history and medications May be recommended more frequently based on current conditions or previous results Hemoglobin A1c Lab Every 3-12 months based on history and previous results Starting at age 72 or earlier with diagnosis of diabetes, high cholesterol, BMI >26, and/or risk factors Frequent monitoring for patients with diabetes to ensure blood sugar control Thyroid Panel (TSH w/ T3 & T4) Every 6 months based on history, symptoms, and risk factors May be repeated more often if on medication HIV One time testing for all patients 23 and older May be repeated more frequently for patients with increased risk factors or exposure Hepatitis C One time testing for all patients 52 and older May be repeated more frequently for patients with increased risk factors or exposure Gonorrhea, Chlamydia Every 12 months for all sexually active persons 13-24 years Additional monitoring may be recommended for those who are considered high risk or who have symptoms PSA Men 55-87 years old with risk factors Additional screening may be recommended from age 23-69 based on risk factors, symptoms, and history  Vaccine Recommendations Tetanus Booster All adults every 10 years Flu Vaccine All patients 6 months and older every year COVID Vaccine All patients 12 years and older Initial dosing with booster May recommend additional booster based on age and health history HPV Vaccine 2 doses all patients age 58-26 Dosing may be considered for patients over 26 Shingles Vaccine (Shingrix) 2 doses all adults 68 years and older Pneumonia (Pneumovax 23) All adults 80 years and older May recommend earlier dosing based on health history Pneumonia (Prevnar 31) All adults 11 years and older Dosed 1 year after Pneumovax 23  Additional Screening, Testing, and Vaccinations may be recommended on an  individualized basis based on family history, health history, risk factors, and/or exposure.

## 2022-03-26 ENCOUNTER — Other Ambulatory Visit (HOSPITAL_BASED_OUTPATIENT_CLINIC_OR_DEPARTMENT_OTHER): Payer: Self-pay

## 2022-03-26 DIAGNOSIS — Z0000 Encounter for general adult medical examination without abnormal findings: Secondary | ICD-10-CM | POA: Diagnosis not present

## 2022-03-26 LAB — POCT UA - MICROALBUMIN
Albumin/Creatinine Ratio, Urine, POC: <30
Creatinine, POC: 100 mg/dL
Microalbumin Ur, POC: 10 mg/L

## 2022-03-27 LAB — CBC WITH DIFF/PLATELET
Basophils Absolute: 0 10*3/uL (ref 0.0–0.2)
Basos: 0 %
EOS (ABSOLUTE): 0.2 10*3/uL (ref 0.0–0.4)
Eos: 3 %
Hematocrit: 34.3 % (ref 34.0–46.6)
Hemoglobin: 11.3 g/dL (ref 11.1–15.9)
Immature Grans (Abs): 0 10*3/uL (ref 0.0–0.1)
Immature Granulocytes: 1 %
Lymphocytes Absolute: 1.9 10*3/uL (ref 0.7–3.1)
Lymphs: 34 %
MCH: 31.4 pg (ref 26.6–33.0)
MCHC: 32.9 g/dL (ref 31.5–35.7)
MCV: 95 fL (ref 79–97)
Monocytes Absolute: 0.5 10*3/uL (ref 0.1–0.9)
Monocytes: 9 %
Neutrophils Absolute: 2.9 10*3/uL (ref 1.4–7.0)
Neutrophils: 53 %
Platelets: 332 10*3/uL (ref 150–450)
RBC: 3.6 x10E6/uL — ABNORMAL LOW (ref 3.77–5.28)
RDW: 14.6 % (ref 11.7–15.4)
WBC: 5.6 10*3/uL (ref 3.4–10.8)

## 2022-03-27 LAB — LP+LDL DIRECT
Cholesterol, Total: 146 mg/dL (ref 100–199)
HDL: 48 mg/dL (ref >39)
LDL Chol Calc (NIH): 82 mg/dL (ref 0–99)
LDL Direct: 84 mg/dL (ref 0–99)
Triglycerides: 86 mg/dL (ref 0–149)
VLDL Cholesterol Cal: 16 mg/dL (ref 5–40)

## 2022-03-27 LAB — COMPREHENSIVE METABOLIC PANEL
ALT: 23 IU/L (ref 0–32)
AST: 18 IU/L (ref 0–40)
Albumin/Globulin Ratio: 1.6 (ref 1.2–2.2)
Albumin: 4.6 g/dL (ref 3.9–4.9)
Alkaline Phosphatase: 137 IU/L — ABNORMAL HIGH (ref 44–121)
BUN/Creatinine Ratio: 10 — ABNORMAL LOW (ref 12–28)
BUN: 8 mg/dL (ref 8–27)
Bilirubin Total: 0.5 mg/dL (ref 0.0–1.2)
CO2: 23 mmol/L (ref 20–29)
Calcium: 9.2 mg/dL (ref 8.7–10.3)
Chloride: 98 mmol/L (ref 96–106)
Creatinine, Ser: 0.84 mg/dL (ref 0.57–1.00)
Globulin, Total: 2.9 g/dL (ref 1.5–4.5)
Glucose: 66 mg/dL — ABNORMAL LOW (ref 70–99)
Potassium: 6.2 mmol/L — ABNORMAL HIGH (ref 3.5–5.2)
Sodium: 136 mmol/L (ref 134–144)
Total Protein: 7.5 g/dL (ref 6.0–8.5)
eGFR: 78 mL/min/{1.73_m2} (ref >59)

## 2022-03-27 LAB — IRON,TIBC AND FERRITIN PANEL
Ferritin: 112 ng/mL (ref 15–150)
Iron Saturation: 21 % (ref 15–55)
Iron: 69 ug/dL (ref 27–139)
Total Iron Binding Capacity: 327 ug/dL (ref 250–450)
UIBC: 258 ug/dL (ref 118–369)

## 2022-03-27 LAB — HEMOGLOBIN A1C
Est. average glucose Bld gHb Est-mCnc: 100 mg/dL
Hgb A1c MFr Bld: 5.1 % (ref 4.8–5.6)

## 2022-03-27 LAB — VITAMIN D 25 HYDROXY (VIT D DEFICIENCY, FRACTURES): Vit D, 25-Hydroxy: 37.2 ng/mL (ref 30.0–100.0)

## 2022-03-29 ENCOUNTER — Other Ambulatory Visit: Payer: Self-pay

## 2022-03-29 ENCOUNTER — Other Ambulatory Visit (HOSPITAL_BASED_OUTPATIENT_CLINIC_OR_DEPARTMENT_OTHER): Payer: Self-pay

## 2022-04-01 ENCOUNTER — Encounter (HOSPITAL_BASED_OUTPATIENT_CLINIC_OR_DEPARTMENT_OTHER): Payer: Self-pay

## 2022-04-01 NOTE — Progress Notes (Signed)
Tried calling pt but VM is full sending pt MyChart message for her to call the office to scheduled these labs.

## 2022-04-04 ENCOUNTER — Encounter (HOSPITAL_BASED_OUTPATIENT_CLINIC_OR_DEPARTMENT_OTHER): Payer: Self-pay | Admitting: Nurse Practitioner

## 2022-04-04 DIAGNOSIS — D6869 Other thrombophilia: Secondary | ICD-10-CM | POA: Insufficient documentation

## 2022-04-04 DIAGNOSIS — Z0000 Encounter for general adult medical examination without abnormal findings: Secondary | ICD-10-CM | POA: Insufficient documentation

## 2022-04-04 NOTE — Assessment & Plan Note (Signed)
Chronic. Stable. No alarm sx present at this time. Chronic anticoagulation in place. Will monitor closely.

## 2022-04-04 NOTE — Assessment & Plan Note (Signed)
Chronic. Refills provided.

## 2022-04-04 NOTE — Assessment & Plan Note (Signed)
Chronic. BP elevated slightly at visit today- encourage daily use of medications. Will monitor closely. If BP remains elevated when medication is taken, please notify the office for further evaluation and recommendations. No alarm sx present at this time.

## 2022-04-04 NOTE — Assessment & Plan Note (Signed)
Chronic. Will check labs today. Continue current medications. Increase walking and utilize compression stockings for management with elevation of LE while seated.

## 2022-04-04 NOTE — Assessment & Plan Note (Addendum)
Chronic. Will obtain labs today for monitoring. No alarm sx present at this time. Continue Trulicity for management.

## 2022-04-04 NOTE — Assessment & Plan Note (Signed)
CPE today with discussion of chronic disease management. Labs today. HM recommendations discussed. Orders placed for activities due. Will need vision examination for DM.

## 2022-04-04 NOTE — Assessment & Plan Note (Signed)
Chronic. Stable. Labs today. No alarm sx present at this time.

## 2022-04-04 NOTE — Assessment & Plan Note (Signed)
Chronic. Recommend fluticasone nasal spray for presence of clear effusion.

## 2022-04-05 ENCOUNTER — Other Ambulatory Visit (HOSPITAL_BASED_OUTPATIENT_CLINIC_OR_DEPARTMENT_OTHER): Payer: Self-pay

## 2022-04-05 DIAGNOSIS — L400 Psoriasis vulgaris: Secondary | ICD-10-CM | POA: Diagnosis not present

## 2022-04-05 MED ORDER — METHOTREXATE 2.5 MG PO TABS
ORAL_TABLET | ORAL | 5 refills | Status: DC
  Filled 2022-04-05: qty 24, 28d supply, fill #0
  Filled 2022-05-12: qty 21, 25d supply, fill #1
  Filled 2022-05-13: qty 3, 3d supply, fill #1

## 2022-04-06 ENCOUNTER — Other Ambulatory Visit (HOSPITAL_BASED_OUTPATIENT_CLINIC_OR_DEPARTMENT_OTHER): Payer: Self-pay

## 2022-04-07 ENCOUNTER — Other Ambulatory Visit (HOSPITAL_BASED_OUTPATIENT_CLINIC_OR_DEPARTMENT_OTHER): Payer: Self-pay

## 2022-04-09 ENCOUNTER — Other Ambulatory Visit (HOSPITAL_BASED_OUTPATIENT_CLINIC_OR_DEPARTMENT_OTHER): Payer: Self-pay

## 2022-04-09 ENCOUNTER — Telehealth (HOSPITAL_BASED_OUTPATIENT_CLINIC_OR_DEPARTMENT_OTHER): Payer: Self-pay | Admitting: Nurse Practitioner

## 2022-04-09 NOTE — Telephone Encounter (Signed)
Received a PA from covermymeds for Bystolic '10mg'$  tablets. Key: [id number] Paperwork in yellow tray

## 2022-04-12 ENCOUNTER — Encounter (HOSPITAL_BASED_OUTPATIENT_CLINIC_OR_DEPARTMENT_OTHER): Payer: Self-pay | Admitting: Nurse Practitioner

## 2022-04-12 ENCOUNTER — Encounter: Payer: Self-pay | Admitting: Nurse Practitioner

## 2022-04-12 ENCOUNTER — Other Ambulatory Visit (HOSPITAL_BASED_OUTPATIENT_CLINIC_OR_DEPARTMENT_OTHER): Payer: Self-pay

## 2022-04-12 ENCOUNTER — Ambulatory Visit: Payer: 59 | Attending: Cardiology | Admitting: Nurse Practitioner

## 2022-04-12 VITALS — BP 128/76 | HR 64 | Ht 65.0 in | Wt 234.8 lb

## 2022-04-12 DIAGNOSIS — I10 Essential (primary) hypertension: Secondary | ICD-10-CM

## 2022-04-12 DIAGNOSIS — G4733 Obstructive sleep apnea (adult) (pediatric): Secondary | ICD-10-CM

## 2022-04-12 DIAGNOSIS — R609 Edema, unspecified: Secondary | ICD-10-CM | POA: Diagnosis not present

## 2022-04-12 DIAGNOSIS — I4819 Other persistent atrial fibrillation: Secondary | ICD-10-CM

## 2022-04-12 DIAGNOSIS — Z5181 Encounter for therapeutic drug level monitoring: Secondary | ICD-10-CM

## 2022-04-12 DIAGNOSIS — Z79899 Other long term (current) drug therapy: Secondary | ICD-10-CM | POA: Diagnosis not present

## 2022-04-12 DIAGNOSIS — I480 Paroxysmal atrial fibrillation: Secondary | ICD-10-CM | POA: Diagnosis not present

## 2022-04-12 MED ORDER — BYSTOLIC 10 MG PO TABS
5.0000 mg | ORAL_TABLET | Freq: Every evening | ORAL | 3 refills | Status: DC
  Filled 2022-04-12 – 2022-04-22 (×3): qty 45, 90d supply, fill #0

## 2022-04-12 NOTE — Patient Instructions (Signed)
Medication Instructions:  Your physician recommends that you continue on your current medications as directed. Please refer to the Current Medication list given to you today.   *If you need a refill on your cardiac medications before your next appointment, please call your pharmacy*   Lab Work: NONE ordered at this time of appointment   If you have labs (blood work) drawn today and your tests are completely normal, you will receive your results only by: [place] (if you have MyChart) OR A paper copy in the mail If you have any lab test that is abnormal or we need to change your treatment, we will call you to review the results.   Testing/Procedures: Schedule Cardiac Stress Test (order in system)   Follow-Up: At [facility], you and your health needs are our priority.  As part of our continuing mission to provide you with exceptional heart care, we have created designated Provider Care Teams.  These Care Teams include your primary Cardiologist (physician) and Advanced Practice Providers (APPs -  Physician Assistants and Nurse Practitioners) who all work together to provide you with the care you need, when you need it.  We recommend signing up for the patient portal called "MyChart".  Sign up information is provided on this After Visit Summary.  MyChart is used to connect with patients for Virtual Visits (Telemedicine).  Patients are able to view lab/test results, encounter notes, upcoming appointments, etc.  Non-urgent messages can be sent to your provider as well.   To learn more about what you can do with MyChart, go to [email or web address].    Your next appointment:   3 month(s)  The format for your next appointment:   In Person  Provider:   Diona Browner, NP        Other Instructions   Important Information About Sugar

## 2022-04-12 NOTE — Progress Notes (Signed)
Office Visit    Patient Name: Monica [name] Date of Encounter: 04/12/2022  Primary Care Provider:  Orma Render, NP Primary Cardiologist:  Kirk Ruths, MD  Chief Complaint    66 year old female with a history of persistent atrial fibrillation, hypertension, lower extremity edema, and obesity who presents for follow-up related to atrial fibrillation s/p DCCV.  Past Medical History    Past Medical History:  Diagnosis Date   Allergic conjunctivitis 03/12/2015   Atrial fibrillation ([place])    a. s/p DCCV in 12/2012 b. repeat DCCV in 09/2014 - started on Flecainide   Avascular necrosis of bone of right hip ([place])    Backache 06/05/2013   Candidiasis of female genitalia 10/14/2013   Chronic anticoagulation 11/19/2016   Cough 06/20/2013   Dysrhythmia    a fib   Essential hypertension, benign 11/30/2012   Headache(784.0) 11/30/2012   History of total hip replacement, right 11/17/2017   History of AVN   Hypertension    IFG (impaired fasting glucose) 10/14/2013   Itchy eyes 03/12/2015   Morbid obesity ([place])    Osteoarthritis    Personal history of colonic polyps - large hyperplastic 12/25/2013   Pre-diabetes    Psoriasis    active breakout left buttocks   Right hip pain    Right knee pain 07/06/2016   Shortness of breath    Skin infection 10/14/2013   Sleep apnea    uses mouth guard only   Swelling of limb 11/30/2012   Tachycardia, unspecified    Past Surgical History:  Procedure Laterality Date   CARDIOVERSION N/A 01/09/2013   Procedure: CARDIOVERSION;  Surgeon: [name], MD;  Location: [facility];  Service: Cardiovascular;  Laterality: N/A;   CARDIOVERSION N/A 10/07/2014   Procedure: CARDIOVERSION;  Surgeon: [name], MD;  [facility] END[facility]COPY;  Service: Cardiovascular;  Laterality: N/A;  09:00 Lido 61m,IV followed by Propofol  753mIV    synched electrocardioversion at 120 joules for Afib,repeated at 200 joules, 70 mg...successfully changed to SRTeagueN/A 12/22/2021   Procedure: CARDIOVERSION;  Surgeon: ScDonato HeinzMD;  [facility];  Service: Cardiovascular;  Laterality: N/A;   FRACTURE SURGERY  07/21/12   right hip arthroplasty   JOINT REPLACEMENT     Left hip Dr. BlNinfa Linden-23-19   TOTAL HIP ARTHROPLASTY  07/21/2012   Procedure: TOTAL HIP ARTHROPLASTY;  Surgeon: FrGearlean AlfMD;  Location: [facility] ORS;  Service: Orthopedics;  Laterality: Right;   TOTAL HIP ARTHROPLASTY Left 04/07/2018   Procedure: LEFT TOTAL HIP ARTHROPLASTY ANTERIOR APPROACH;  Surgeon: BlMcarthur RossettiMD;  Location: [facility] ORS;  Service: Orthopedics;  Laterality: Left;  Needs RNFA    Allergies  No Known Allergies  History of Present Illness    89ear old female with the above past medical history including persistent atrial fibrillation, hypertension, lower extremity edema, and obesity.  She has a history of persistent atrial fibrillation s/p DCCV in May 2014 with restoration of NSR.  She developed recurrent atrial fibrillation and underwent repeat cardioversion successfully on flecainide.  Follow-up exercise treadmill and flecainide did not show any significant arrhythmia.  He has remained on flecainide and Xarelto.  At her office visit in January 2023 she was noted to be back in atrial fibrillation.  Echocardiogram in February 2023 showed normal LV function, severe left atrial enlargement, right atrial enlargement, mild mitral valve regurgitation, mild to moderate tricuspid valve regurgitation.  She was last seen in the office on 12/08/2021 and reported  increased fatigue.  She remained in atrial fibrillation. Flecainide was increased to 100 mg bid. She underwent repeat DCCV on 12/22/2021 with restoration of NSR.  ETT was recommended in the setting of increased flecainide dosing.  Unfortunately, when she presented for GXT for flecainide monitoring on 01/12/2022 she was noted to be back in atrial fibrillation. GXT was cancelled.   She presents today  for follow-up accompanied by her husband.  Since her last visit and since her procedure he has been stable from a cardiac standpoint.  She denies any chest pain, dyspnea, palpitations, dizziness, presyncope, syncope.  She is back in NSR today.  Overall, she reports feeling well and denies any new concerns today.  Home Medications    Current Outpatient Medications  Medication Sig Dispense Refill   acetaminophen (TYLENOL) 650 MG CR tablet Take 650-1,300 mg by mouth every 8 (eight) hours as needed for pain.     Blood Pressure Monitor MISC Use to check blood pressure once daily 1 each 0   calcium carbonate (TUMS EX) 750 MG chewable tablet Chew 1-3 tablets by mouth 3 (three) times daily as needed for heartburn.     Cholecalciferol (VITAMIN D3) 2000 units TABS Take 2,000 Units by mouth every evening.     clobetasol ointment (TEMOVATE) 7.25 % Apply 1 application on the skin DAILY Apply daily to thick areas 60 g 0   COVID-19 At Home Antigen Test (CARESTART COVID-19 HOME TEST) KIT Use as directed per package instructions 8 each 0   desonide (DESOWEN) 0.05 % cream Apply 1 application topically 2 (two) times daily as needed (for psoriasis).   0   diclofenac sodium (VOLTAREN) 1 % GEL Apply 4 g topically 4 (four) times daily. (Patient taking differently: Apply 4 g topically 4 (four) times daily as needed (pain).) 5 Tube 1   Dulaglutide (TRULICITY) 3 DG/6.4QI SOPN Inject 0.5 mLs (3 mg total) into the skin every 7 days. 2 mL 5   flecainide (TAMBOCOR) 100 MG tablet Take 1 tablet (100 mg total) by mouth 2 (two) times daily. 180 tablet 3   fluticasone (FLONASE) 50 MCG/ACT nasal spray PLACE 2 SPRAYS IN EACH NOSTRIL EVERY DAY 16 g 3   folic acid (FOLVITE) 1 MG tablet TAKE 2 TABLETS BY MOUTH DAILY 60 tablet 3   hydrochlorothiazide (HYDRODIURIL) 25 MG tablet Take 1 tablet (25 mg total) by mouth every morning. 90 tablet 3   methotrexate (RHEUMATREX) 2.5 MG tablet Take 6 tablet by mouth once a week 24 tablet 5    methotrexate (RHEUMATREX) 2.5 MG tablet Take 6 tablets by mouth once a week 24 tablet 5   Multiple Vitamin (MULTIVITAMIN WITH MINERALS) TABS tablet Take 1 tablet by mouth daily.     potassium chloride (KLOR-CON) 10 MEQ tablet TAKE 2 TABLETS BY MOUTH TWICE DAILY 360 tablet 3   Propylene Glycol-Glycerin 1-0.3 % SOLN Place 1 drop into both eyes 3 (three) times daily as needed (for dry eyes).     rivaroxaban (XARELTO) 20 MG TABS tablet Take 1 tablet (20 mg total) by mouth daily. 90 tablet 3   sertraline (ZOLOFT) 50 MG tablet Take 1.5 tablets (75 mg total) by mouth at bedtime. 145 tablet 3   BYSTOLIC 10 MG tablet Take 1/2 tablet (5 mg total) by mouth every evening. 45 tablet 3   No current facility-administered medications for this visit.     Review of Systems    She denies chest pain, palpitations, dyspnea, pnd, orthopnea, n, v, dizziness, syncope, edema, weight  gain, or early satiety. All other systems reviewed and are otherwise negative except as noted above.   Physical Exam    VS:  BP 128/76   Pulse 64   Ht 5' 5"  (1.651 m)   Wt 234 lb 12.8 oz (106.5 kg)   LMP 06/19/2013   SpO2 97%   BMI 39.07 kg/m  GEN: Well nourished, well developed, in no acute distress. HEENT: normal. Neck: Supple, no JVD, carotid bruits, or masses. Cardiac: RRR, no murmurs, rubs, or gallops. No clubbing, cyanosis, edema.  Radials/DP/PT 2+ and equal bilaterally.  Respiratory:  Respirations regular and unlabored, clear to auscultation bilaterally. GI: Soft, nontender, nondistended, BS + x 4. MS: no deformity or atrophy. Skin: warm and dry, no rash. Neuro:  Strength and sensation are intact. Psych: Normal affect.  Accessory Clinical Findings    ECG personally reviewed by me today -sinus rhythm, 64 bpm, first-degree AV block- no acute changes.   Lab Results  Component Value Date   WBC 5.6 03/26/2022   HGB 11.3 03/26/2022   HCT 34.3 03/26/2022   MCV 95 03/26/2022   PLT 332 03/26/2022   Lab Results   Component Value Date   CREATININE 0.84 03/26/2022   BUN 8 03/26/2022   NA 136 03/26/2022   K 6.2 (H) 03/26/2022   CL 98 03/26/2022   CO2 23 03/26/2022   Lab Results  Component Value Date   ALT 23 03/26/2022   AST 18 03/26/2022   ALKPHOS 137 (H) 03/26/2022   BILITOT 0.5 03/26/2022   Lab Results  Component Value Date   CHOL 146 03/26/2022   HDL 48 03/26/2022   LDLCALC 82 03/26/2022   LDLDIRECT 84 03/26/2022   TRIG 86 03/26/2022   CHOLHDL 2.9 09/10/2021    Lab Results  Component Value Date   HGBA1C 5.1 03/26/2022    Assessment & Plan    1. Persistent atrial fibrillation: Flecainide was increased in 11/2021 to 100 mg bid.  S/p DCCV in 12/2021.  Unable to complete ETT as she was back in atrial fibrillation.  EKG today shows sinus rhythm, 64 bpm.  Will reattempt ETT given increased dose of flecainide.  Discussed possible referral to A-fib clinic, however, patient declines at this time.  If she does have recurrent atrial fibrillation, would recommend referral to A-fib clinic for further consideration of antiarrhythmic therapy, possible ablation.  Continue flecainide, Bystolic, Xarelto.  Shared Decision Making/Informed Consent The risks [chest pain, shortness of breath, cardiac arrhythmias, dizziness, blood pressure fluctuations, myocardial infarction, stroke/transient ischemic attack, and life-threatening complications (estimated to be 1 in 10,000)], benefits (risk stratification, diagnosing coronary artery disease, treatment guidance) and alternatives of an exercise tolerance test were discussed in detail with Ms. Guzek and she agrees to proceed.  2. Hypertension: BP well controlled. Continue current antihypertensive regimen.   3. Lower extremity edema: Echo in February 2023 showed normal LV function, severe left atrial enlargement, right atrial enlargement, mild mitral valve regurgitation, mild to moderate tricuspid valve regurgitation. Euvolemic and well compensated on exam, edema  has resolved. Continue hydrochlorothiazide.  4. Obesity: Continue to encourage increased activity as tolerated.  5. Disposition: Follow-up in 3 months.      Lenna Sciara, NP 04/12/2022, 5:42 PM

## 2022-04-13 ENCOUNTER — Other Ambulatory Visit (INDEPENDENT_AMBULATORY_CARE_PROVIDER_SITE_OTHER): Payer: 59

## 2022-04-13 DIAGNOSIS — I4819 Other persistent atrial fibrillation: Secondary | ICD-10-CM

## 2022-04-15 ENCOUNTER — Other Ambulatory Visit (HOSPITAL_BASED_OUTPATIENT_CLINIC_OR_DEPARTMENT_OTHER): Payer: Self-pay

## 2022-04-20 ENCOUNTER — Encounter (HOSPITAL_BASED_OUTPATIENT_CLINIC_OR_DEPARTMENT_OTHER): Payer: Self-pay | Admitting: Nurse Practitioner

## 2022-04-20 ENCOUNTER — Telehealth (HOSPITAL_COMMUNITY): Payer: Self-pay | Admitting: *Deleted

## 2022-04-20 NOTE — Telephone Encounter (Signed)
Close encounter 

## 2022-04-21 ENCOUNTER — Ambulatory Visit (HOSPITAL_COMMUNITY)
Admission: RE | Admit: 2022-04-21 | Payer: 59 | Source: Ambulatory Visit | Attending: Nurse Practitioner | Admitting: Nurse Practitioner

## 2022-04-21 ENCOUNTER — Other Ambulatory Visit (HOSPITAL_BASED_OUTPATIENT_CLINIC_OR_DEPARTMENT_OTHER): Payer: Self-pay

## 2022-04-21 ENCOUNTER — Encounter: Payer: Self-pay | Admitting: Cardiology

## 2022-04-22 ENCOUNTER — Other Ambulatory Visit (HOSPITAL_BASED_OUTPATIENT_CLINIC_OR_DEPARTMENT_OTHER): Payer: Self-pay

## 2022-04-22 NOTE — Telephone Encounter (Signed)
Prior Monica [name] was sent through cover my meds

## 2022-04-25 ENCOUNTER — Telehealth: Payer: 59 | Admitting: Physician Assistant

## 2022-04-25 DIAGNOSIS — U071 COVID-19: Secondary | ICD-10-CM

## 2022-04-26 ENCOUNTER — Other Ambulatory Visit (HOSPITAL_BASED_OUTPATIENT_CLINIC_OR_DEPARTMENT_OTHER): Payer: Self-pay

## 2022-04-26 ENCOUNTER — Telehealth: Payer: 59 | Admitting: Physician Assistant

## 2022-04-26 DIAGNOSIS — U071 COVID-19: Secondary | ICD-10-CM

## 2022-04-26 MED ORDER — MOLNUPIRAVIR EUA 200MG CAPSULE
4.0000 | ORAL_CAPSULE | Freq: Two times a day (BID) | ORAL | 0 refills | Status: AC
  Filled 2022-04-26: qty 40, 5d supply, fill #0

## 2022-04-26 MED ORDER — BENZONATATE 100 MG PO CAPS
100.0000 mg | ORAL_CAPSULE | Freq: Three times a day (TID) | ORAL | 0 refills | Status: DC | PRN
  Filled 2022-04-26: qty 30, 10d supply, fill #0

## 2022-04-26 MED ORDER — PROMETHAZINE-DM 6.25-15 MG/5ML PO SYRP
5.0000 mL | ORAL_SOLUTION | Freq: Four times a day (QID) | ORAL | 0 refills | Status: DC | PRN
  Filled 2022-04-26: qty 118, 6d supply, fill #0

## 2022-04-26 NOTE — Progress Notes (Signed)
Virtual Visit Consent   Monica [name], you are scheduled for a virtual visit with a [facility] provider today. Just as with appointments in the office, your consent must be obtained to participate. Your consent will be active for this visit and any virtual visit you may have with one of our providers in the next 365 days. If you have a MyChart account, a copy of this consent can be sent to you electronically.  As this is a virtual visit, video technology does not allow for your provider to perform a traditional examination. This may limit your provider's ability to fully assess your condition. If your provider identifies any concerns that need to be evaluated in person or the need to arrange testing (such as labs, EKG, etc.), we will make arrangements to do so. Although advances in technology are sophisticated, we cannot ensure that it will always work on either your end or our end. If the connection with a video visit is poor, the visit may have to be switched to a telephone visit. With either a video or telephone visit, we are not always able to ensure that we have a secure connection.  By engaging in this virtual visit, you consent to the provision of healthcare and authorize for your insurance to be billed (if applicable) for the services provided during this visit. Depending on your insurance coverage, you may receive a charge related to this service.  I need to obtain your verbal consent now. Are you willing to proceed with your visit today? Monica [name] has provided verbal consent on 04/26/2022 for a virtual visit (video or telephone). Monica Daring, PA-C  Date: 04/26/2022 9:40 AM  Virtual Visit via Video Note   I, Monica [name], connected with  Monica [name]  ([id number], 02/03/1956) on 04/26/22 at  9:30 AM EDT by a video-enabled telemedicine application and verified that I am speaking with the correct person using two identifiers.  Location: Patient: Virtual Visit Location  Patient: Home Provider: Virtual Visit Location Provider: Home Office   I discussed the limitations of evaluation and management by telemedicine and the availability of in person appointments. The patient expressed understanding and agreed to proceed.    History of Present Illness: Monica [name] is a 66 y.o. who identifies as a female who was assigned female at birth, and is being seen today for Covid 32.  HPI: URI  This is a new problem. The current episode started yesterday (04/25/22 tested positive for Covid 19 on at home test; Symptoms started Saturday morning early (works third shift )). The problem has been gradually worsening. Maximum temperature: subjective fever. Associated symptoms include congestion, coughing, headaches and a sore throat. Pertinent negatives include no diarrhea, ear pain, nausea, plugged ear sensation, rhinorrhea, sinus pain or vomiting. She has tried acetaminophen (elderberry syrup) for the symptoms. The treatment provided mild relief.     Problems:  Patient Active Problem List   Diagnosis Date Noted   Encounter for annual physical exam 04/04/2022   Hypercoagulable state due to longstanding persistent atrial fibrillation ([place]) 04/04/2022   Depression 09/10/2021   Vitamin D deficiency 09/10/2021   DM2 (diabetes mellitus, type 2) ([place]) 09/10/2021   Status post left hip replacement 04/07/2018   Unilateral primary osteoarthritis, left hip 02/23/2018   Psoriatic arthropathy ([place]) 11/17/2017   Tinnitus 11/19/2016   Left hip pain 10/21/2015   Gastroesophageal reflux disease 09/19/2015   OSA (obstructive sleep apnea) 02/20/2015   Morbid obesity ([place]) 01/15/2015  Lymphedema 06/19/2014   Personal history of colonic polyps - large hyperplastic 12/25/2013   Carpal tunnel syndrome 10/14/2013   Allergic rhinitis 06/20/2013   Edema 12/20/2012   Avascular necrosis of hip ([place]) 07/21/2012   Atrial fibrillation ([place]) 07/06/2012   Hypertension associated with diabetes  ([place]) 09/14/2011   Psoriasis 08/16/2005    Allergies: No Known Allergies Medications:  Current Outpatient Medications:    benzonatate (TESSALON) 100 MG capsule, Take 1 capsule (100 mg total) by mouth 3 (three) times daily as needed., Disp: 30 capsule, Rfl: 0   molnupiravir EUA (LAGEVRIO) 200 mg CAPS capsule, Take 4 capsules (800 mg total) by mouth 2 (two) times daily for 5 days., Disp: 40 capsule, Rfl: 0   promethazine-dextromethorphan (PROMETHAZINE-DM) 6.25-15 MG/5ML syrup, Take 5 mLs by mouth 4 (four) times daily as needed., Disp: 118 mL, Rfl: 0   acetaminophen (TYLENOL) 650 MG CR tablet, Take 650-1,300 mg by mouth every 8 (eight) hours as needed for pain., Disp: , Rfl:    Blood Pressure Monitor MISC, Use to check blood pressure once daily, Disp: 1 each, Rfl: 0   BYSTOLIC 10 MG tablet, Take 1/2 tablet (5 mg total) by mouth every evening., Disp: 45 tablet, Rfl: 3   calcium carbonate (TUMS EX) 750 MG chewable tablet, Chew 1-3 tablets by mouth 3 (three) times daily as needed for heartburn., Disp: , Rfl:    Cholecalciferol (VITAMIN D3) 2000 units TABS, Take 2,000 Units by mouth every evening., Disp: , Rfl:    clobetasol ointment (TEMOVATE) 8.31 %, Apply 1 application on the skin DAILY Apply daily to thick areas, Disp: 60 g, Rfl: 0   COVID-19 At Home Antigen Test (CARESTART COVID-19 HOME TEST) KIT, Use as directed per package instructions, Disp: 8 each, Rfl: 0   desonide (DESOWEN) 0.05 % cream, Apply 1 application topically 2 (two) times daily as needed (for psoriasis). , Disp: , Rfl: 0   diclofenac sodium (VOLTAREN) 1 % GEL, Apply 4 g topically 4 (four) times daily. (Patient taking differently: Apply 4 g topically 4 (four) times daily as needed (pain).), Disp: 5 Tube, Rfl: 1   Dulaglutide (TRULICITY) 3 DV/7.6HY SOPN, Inject 0.5 mLs (3 mg total) into the skin every 7 days., Disp: 2 mL, Rfl: 5   flecainide (TAMBOCOR) 100 MG tablet, Take 1 tablet (100 mg total) by mouth 2 (two) times daily., Disp: 180  tablet, Rfl: 3   fluticasone (FLONASE) 50 MCG/ACT nasal spray, PLACE 2 SPRAYS IN EACH NOSTRIL EVERY DAY, Disp: 16 g, Rfl: 3   folic acid (FOLVITE) 1 MG tablet, TAKE 2 TABLETS BY MOUTH DAILY, Disp: 60 tablet, Rfl: 3   hydrochlorothiazide (HYDRODIURIL) 25 MG tablet, Take 1 tablet (25 mg total) by mouth every morning., Disp: 90 tablet, Rfl: 3   methotrexate (RHEUMATREX) 2.5 MG tablet, Take 6 tablet by mouth once a week, Disp: 24 tablet, Rfl: 5   methotrexate (RHEUMATREX) 2.5 MG tablet, Take 6 tablets by mouth once a week, Disp: 24 tablet, Rfl: 5   Multiple Vitamin (MULTIVITAMIN WITH MINERALS) TABS tablet, Take 1 tablet by mouth daily., Disp: , Rfl:    potassium chloride (KLOR-CON) 10 MEQ tablet, TAKE 2 TABLETS BY MOUTH TWICE DAILY, Disp: 360 tablet, Rfl: 3   Propylene Glycol-Glycerin 1-0.3 % SOLN, Place 1 drop into both eyes 3 (three) times daily as needed (for dry eyes)., Disp: , Rfl:    rivaroxaban (XARELTO) 20 MG TABS tablet, Take 1 tablet (20 mg total) by mouth daily., Disp: 90 tablet, Rfl: 3  sertraline (ZOLOFT) 50 MG tablet, Take 1.5 tablets (75 mg total) by mouth at bedtime., Disp: 145 tablet, Rfl: 3  Observations/Objective: Patient is well-developed, well-nourished in no acute distress.  Resting comfortably at home.  Head is normocephalic, atraumatic.  No labored breathing.  Speech is clear and coherent with logical content.  Patient is alert and oriented at baseline.    Assessment and Plan: 1. COVID-19 - MyChart COVID-19 home monitoring program; Future - molnupiravir EUA (LAGEVRIO) 200 mg CAPS capsule; Take 4 capsules (800 mg total) by mouth 2 (two) times daily for 5 days.  Dispense: 40 capsule; Refill: 0 - promethazine-dextromethorphan (PROMETHAZINE-DM) 6.25-15 MG/5ML syrup; Take 5 mLs by mouth 4 (four) times daily as needed.  Dispense: 118 mL; Refill: 0 - benzonatate (TESSALON) 100 MG capsule; Take 1 capsule (100 mg total) by mouth 3 (three) times daily as needed.  Dispense: 30  capsule; Refill: 0  - Continue OTC symptomatic management of choice - Will send OTC vitamins and supplement information through AVS - Molnupiravir, Promethazine DM and Tessalon perles prescribed - Patient enrolled in MyChart symptom monitoring - Push fluids - Rest as needed - Discussed return precautions and when to seek in-person evaluation, sent via [facility] as well   Follow Up Instructions: I discussed the assessment and treatment plan with the patient. The patient was provided an opportunity to ask questions and all were answered. The patient agreed with the plan and demonstrated an understanding of the instructions.  A copy of instructions were sent to the patient via MyChart unless otherwise noted below.   The patient was advised to call back or seek an in-person evaluation if the symptoms worsen or if the condition fails to improve as anticipated.  Time:  I spent 12 minutes with the patient via telehealth technology discussing the above problems/concerns.    Monica Daring, PA-C

## 2022-04-26 NOTE — Patient Instructions (Signed)
Ted Mcalpine, thank you for joining Mar Daring, PA-C for today's virtual visit.  While this provider is not your primary care provider (PCP), if your PCP is located in our provider database this encounter information will be shared with them immediately following your visit.  Consent: (Patient) Ted Mcalpine provided verbal consent for this virtual visit at the beginning of the encounter.  Current Medications:  Current Outpatient Medications:    benzonatate (TESSALON) 100 MG capsule, Take 1 capsule (100 mg total) by mouth 3 (three) times daily as needed., Disp: 30 capsule, Rfl: 0   molnupiravir EUA (LAGEVRIO) 200 mg CAPS capsule, Take 4 capsules (800 mg total) by mouth 2 (two) times daily for 5 days., Disp: 40 capsule, Rfl: 0   promethazine-dextromethorphan (PROMETHAZINE-DM) 6.25-15 MG/5ML syrup, Take 5 mLs by mouth 4 (four) times daily as needed., Disp: 118 mL, Rfl: 0   acetaminophen (TYLENOL) 650 MG CR tablet, Take 650-1,300 mg by mouth every 8 (eight) hours as needed for pain., Disp: , Rfl:    Blood Pressure Monitor MISC, Use to check blood pressure once daily, Disp: 1 each, Rfl: 0   BYSTOLIC 10 MG tablet, Take 1/2 tablet (5 mg total) by mouth every evening., Disp: 45 tablet, Rfl: 3   calcium carbonate (TUMS EX) 750 MG chewable tablet, Chew 1-3 tablets by mouth 3 (three) times daily as needed for heartburn., Disp: , Rfl:    Cholecalciferol (VITAMIN D3) 2000 units TABS, Take 2,000 Units by mouth every evening., Disp: , Rfl:    clobetasol ointment (TEMOVATE) 2.42 %, Apply 1 application on the skin DAILY Apply daily to thick areas, Disp: 60 g, Rfl: 0   COVID-19 At Home Antigen Test (CARESTART COVID-19 HOME TEST) KIT, Use as directed per package instructions, Disp: 8 each, Rfl: 0   desonide (DESOWEN) 0.05 % cream, Apply 1 application topically 2 (two) times daily as needed (for psoriasis). , Disp: , Rfl: 0   diclofenac sodium (VOLTAREN) 1 % GEL, Apply 4 g topically 4 (four) times  daily. (Patient taking differently: Apply 4 g topically 4 (four) times daily as needed (pain).), Disp: 5 Tube, Rfl: 1   Dulaglutide (TRULICITY) 3 PN/3.6RW SOPN, Inject 0.5 mLs (3 mg total) into the skin every 7 days., Disp: 2 mL, Rfl: 5   flecainide (TAMBOCOR) 100 MG tablet, Take 1 tablet (100 mg total) by mouth 2 (two) times daily., Disp: 180 tablet, Rfl: 3   fluticasone (FLONASE) 50 MCG/ACT nasal spray, PLACE 2 SPRAYS IN EACH NOSTRIL EVERY DAY, Disp: 16 g, Rfl: 3   folic acid (FOLVITE) 1 MG tablet, TAKE 2 TABLETS BY MOUTH DAILY, Disp: 60 tablet, Rfl: 3   hydrochlorothiazide (HYDRODIURIL) 25 MG tablet, Take 1 tablet (25 mg total) by mouth every morning., Disp: 90 tablet, Rfl: 3   methotrexate (RHEUMATREX) 2.5 MG tablet, Take 6 tablet by mouth once a week, Disp: 24 tablet, Rfl: 5   methotrexate (RHEUMATREX) 2.5 MG tablet, Take 6 tablets by mouth once a week, Disp: 24 tablet, Rfl: 5   Multiple Vitamin (MULTIVITAMIN WITH MINERALS) TABS tablet, Take 1 tablet by mouth daily., Disp: , Rfl:    potassium chloride (KLOR-CON) 10 MEQ tablet, TAKE 2 TABLETS BY MOUTH TWICE DAILY, Disp: 360 tablet, Rfl: 3   Propylene Glycol-Glycerin 1-0.3 % SOLN, Place 1 drop into both eyes 3 (three) times daily as needed (for dry eyes)., Disp: , Rfl:    rivaroxaban (XARELTO) 20 MG TABS tablet, Take 1 tablet (20 mg total) by mouth  daily., Disp: 90 tablet, Rfl: 3   sertraline (ZOLOFT) 50 MG tablet, Take 1.5 tablets (75 mg total) by mouth at bedtime., Disp: 145 tablet, Rfl: 3   Medications ordered in this encounter:  Meds ordered this encounter  Medications   molnupiravir EUA (LAGEVRIO) 200 mg CAPS capsule    Sig: Take 4 capsules (800 mg total) by mouth 2 (two) times daily for 5 days.    Dispense:  40 capsule    Refill:  0    Order Specific Question:   Supervising Provider    Answer:   Chase Picket [id number]   promethazine-dextromethorphan (PROMETHAZINE-DM) 6.25-15 MG/5ML syrup    Sig: Take 5 mLs by mouth 4 (four)  times daily as needed.    Dispense:  118 mL    Refill:  0    Order Specific Question:   Supervising Provider    Answer:   Chase Picket [[id number]]   benzonatate (TESSALON) 100 MG capsule    Sig: Take 1 capsule (100 mg total) by mouth 3 (three) times daily as needed.    Dispense:  30 capsule    Refill:  0    Order Specific Question:   Supervising Provider    Answer:   Chase Picket [id number]     *If you need refills on other medications prior to your next appointment, please contact your pharmacy*  Follow-Up: Call back or seek an in-person evaluation if the symptoms worsen or if the condition fails to improve as anticipated.  Other Instructions  [place] at [phone]. The call center hours are Monday through Friday, 8:30 a.m.-5 p.m., and Saturday and Sunday, 8:30 a.m.-noon. Team members can leave a message after hours and their call will be returned on the next business day  Quarantine and Audiological scientist and isolation refer to local and travel restrictions to protect the public and travelers from contagious diseases that constitute a public health threat. Contagious diseases are diseases that can spread from one person to another. Quarantine and isolation help to protect the public by preventing exposure to people who have or may have a contagious disease. Isolation separates people who are sick with a contagious disease from people who are not sick. Quarantine separates and restricts the movement of people who were exposed to a contagious disease to see if they become sick. You may be put in quarantine or isolation if you have been exposed to or diagnosed with any of the following diseases: Severe acute respiratory syndromes, such as COVID-19. Cholera. Diphtheria. Tuberculosis. Plague. Smallpox. Yellow fever. Viral hemorrhagic fevers, such as [place], Ebola, and [place]. When to quarantine or isolate Follow these rules, whether you have been  vaccinated or not: Stay home and isolate from others when you are sick with a contagious disease. Isolate when you test positive for a contagious disease, even if you do not have symptoms. Isolate if you are sick and suspect that you may have a contagious disease. If you suspect that you have a contagious disease, get tested. If your test results are negative, you can end your isolation. If your test results are positive, follow the full isolation recommendations as told by your health care provider or local health authorities. Quarantine and stay away from others when you have been in close contact with someone who has tested positive for a contagious disease. Close contact is defined as being less than 6 ft (1.8 m) away from an infected person for a total of 15 minutes or more  over a 24-hour period. Do not go to places where you are unable to wear a mask, such as restaurants and some gyms. Stay home and separate from others as much as possible. Avoid being around people who may get very sick from the contagious disease that you have. Use a separate bathroom, if possible. Do not travel. For travel guidance, visit the CDC's travel webpage at SwankBlog.no Follow these instructions at home: Medicines  Take over-the-counter and prescription medicines as told by your health care provider. Finish all antibiotic medicine even when you start to feel better. Stay up to date with all your vaccines. Get scheduled vaccines and boosters as recommended by your health care provider. Lifestyle Wear a high-quality mask if you must be around others at home and in public, if recommended. Improve air flow (ventilation) at home to help prevent the disease from spreading to other people, if possible. Do not share personal household items, like cups, towels, and utensils. Practice everyday hygiene and cleaning. General instructions Talk to your health care provider if you have a weakened body defense  system (immune system). People with a weakened immune system may have a reduced immune response to vaccines. You may need to follow current prevention measures, including wearing a well-fitting mask, avoiding crowds, and avoiding poorly ventilated indoor places. Monitor symptoms and follow health care provider instructions, which may include resting, drinking fluids, and taking medicines. Follow specific isolation and quarantine recommendations if you are in places that can lead to disease outbreaks, such as correctional and detention facilities, homeless shelters, and cruise ships. Return to your normal activities as told by your health care provider. Ask your health care provider what activities are safe for you. Keep all follow-up visits. This is important. Where to find more information CDC: SodaWaters.hu Contact a health care provider if: You have a fever. You have signs and symptoms that return or get worse after isolation. Get help right away if: You have difficulty breathing. You have chest pain. These symptoms may be an emergency. Get help right away. Call 911. Do not wait to see if the symptoms will go away. Do not drive yourself to the hospital. Summary Isolation and quarantine help protect the public by preventing exposure to people who have or may have a contagious disease. Isolate when you are sick or when you test positive, even if you do not have symptoms. Quarantine and stay away from others when you have been in close contact with someone who has tested positive for a contagious disease. This information is not intended to replace advice given to you by your health care provider. Make sure you discuss any questions you have with your health care provider. Document Revised: 08/13/2021 Document Reviewed: 07/23/2021 Elsevier Patient Education  [employer].    If you have been instructed to have an in-person evaluation today at a local Urgent Care  facility, please use the link below. It will take you to a list of all of our available [facility], including address, phone number and hours of operation. Please do not delay care.  [facility]  If you or a family member do not have a primary care provider, use the link below to schedule a visit and establish care. When you choose a [facility] primary care physician or advanced practice provider, you gain a long-term partner in health. Find a Primary Care Provider  Learn more about [facility]'s in-office and virtual care options: [facility]  Now

## 2022-04-26 NOTE — Progress Notes (Signed)
   Thank you for the details you included in the comment boxes. Those details are very helpful in determining the best course of treatment for you and help [place] to provide the best care.Because yo are covid positive and a candidate for anti-viral medications, we recommend that you convert this visit to a video visit in order for the provider to better assess what is going on.  The provider will be able to give you a more accurate diagnosis and treatment plan if we can more freely discuss your symptoms and with the addition of a virtual examination.   If you convert to a video visit, we will bill your insurance (similar to an office visit) and you will not be charged for this e-Visit. You will be able to stay at home and speak with the first available [facility] advanced practice provider. The link to do a video visit is in the drop down Menu tab of your Welcome screen in [place].  I provided 5 minutes of non face-to-face time during this encounter for chart review and documentation.

## 2022-05-03 ENCOUNTER — Other Ambulatory Visit (HOSPITAL_BASED_OUTPATIENT_CLINIC_OR_DEPARTMENT_OTHER): Payer: Self-pay

## 2022-05-04 ENCOUNTER — Other Ambulatory Visit (HOSPITAL_BASED_OUTPATIENT_CLINIC_OR_DEPARTMENT_OTHER): Payer: Self-pay

## 2022-05-05 ENCOUNTER — Telehealth (HOSPITAL_COMMUNITY): Payer: Self-pay | Admitting: *Deleted

## 2022-05-05 NOTE — Telephone Encounter (Signed)
Close encounter 

## 2022-05-06 ENCOUNTER — Ambulatory Visit (HOSPITAL_COMMUNITY)
Admission: RE | Admit: 2022-05-06 | Payer: 59 | Source: Ambulatory Visit | Attending: Nurse Practitioner | Admitting: Nurse Practitioner

## 2022-05-07 ENCOUNTER — Other Ambulatory Visit (HOSPITAL_BASED_OUTPATIENT_CLINIC_OR_DEPARTMENT_OTHER): Payer: Self-pay

## 2022-05-07 ENCOUNTER — Other Ambulatory Visit (HOSPITAL_BASED_OUTPATIENT_CLINIC_OR_DEPARTMENT_OTHER): Payer: Self-pay | Admitting: Nurse Practitioner

## 2022-05-07 ENCOUNTER — Encounter (HOSPITAL_BASED_OUTPATIENT_CLINIC_OR_DEPARTMENT_OTHER): Payer: Self-pay | Admitting: Nurse Practitioner

## 2022-05-07 DIAGNOSIS — I152 Hypertension secondary to endocrine disorders: Secondary | ICD-10-CM

## 2022-05-07 DIAGNOSIS — I4819 Other persistent atrial fibrillation: Secondary | ICD-10-CM

## 2022-05-07 MED ORDER — NEBIVOLOL HCL 10 MG PO TABS
5.0000 mg | ORAL_TABLET | Freq: Every day | ORAL | 3 refills | Status: DC
  Filled 2022-05-07: qty 45, 90d supply, fill #0

## 2022-05-12 ENCOUNTER — Other Ambulatory Visit (HOSPITAL_BASED_OUTPATIENT_CLINIC_OR_DEPARTMENT_OTHER): Payer: Self-pay | Admitting: Nurse Practitioner

## 2022-05-12 ENCOUNTER — Other Ambulatory Visit (HOSPITAL_BASED_OUTPATIENT_CLINIC_OR_DEPARTMENT_OTHER): Payer: Self-pay

## 2022-05-12 DIAGNOSIS — U071 COVID-19: Secondary | ICD-10-CM

## 2022-05-13 ENCOUNTER — Other Ambulatory Visit (HOSPITAL_BASED_OUTPATIENT_CLINIC_OR_DEPARTMENT_OTHER): Payer: Self-pay

## 2022-05-17 ENCOUNTER — Other Ambulatory Visit (HOSPITAL_BASED_OUTPATIENT_CLINIC_OR_DEPARTMENT_OTHER): Payer: Self-pay

## 2022-05-17 DIAGNOSIS — L400 Psoriasis vulgaris: Secondary | ICD-10-CM | POA: Diagnosis not present

## 2022-05-19 ENCOUNTER — Other Ambulatory Visit (HOSPITAL_BASED_OUTPATIENT_CLINIC_OR_DEPARTMENT_OTHER): Payer: Self-pay

## 2022-05-19 MED ORDER — BENZONATATE 100 MG PO CAPS
100.0000 mg | ORAL_CAPSULE | Freq: Three times a day (TID) | ORAL | 0 refills | Status: DC | PRN
  Filled 2022-05-19: qty 30, 10d supply, fill #0

## 2022-05-24 ENCOUNTER — Other Ambulatory Visit (HOSPITAL_BASED_OUTPATIENT_CLINIC_OR_DEPARTMENT_OTHER): Payer: Self-pay

## 2022-06-01 ENCOUNTER — Telehealth (HOSPITAL_COMMUNITY): Payer: Self-pay | Admitting: *Deleted

## 2022-06-01 NOTE — Telephone Encounter (Signed)
Close encounter 

## 2022-06-02 ENCOUNTER — Ambulatory Visit (HOSPITAL_COMMUNITY)
Admission: RE | Admit: 2022-06-02 | Payer: 59 | Source: Ambulatory Visit | Attending: Nurse Practitioner | Admitting: Nurse Practitioner

## 2022-06-07 ENCOUNTER — Other Ambulatory Visit (HOSPITAL_BASED_OUTPATIENT_CLINIC_OR_DEPARTMENT_OTHER): Payer: Self-pay

## 2022-06-10 ENCOUNTER — Telehealth (HOSPITAL_COMMUNITY): Payer: Self-pay | Admitting: *Deleted

## 2022-06-10 ENCOUNTER — Encounter (HOSPITAL_COMMUNITY): Payer: Self-pay | Admitting: *Deleted

## 2022-06-10 ENCOUNTER — Ambulatory Visit (HOSPITAL_COMMUNITY): Admission: RE | Admit: 2022-06-10 | Payer: 59 | Source: Ambulatory Visit

## 2022-06-10 NOTE — Telephone Encounter (Signed)
Close encounter 

## 2022-06-10 NOTE — Telephone Encounter (Signed)
Letter sent via My Chart outlining instructions for upcoming stress test on 06/14/22 at 3:30.

## 2022-06-11 ENCOUNTER — Other Ambulatory Visit (HOSPITAL_BASED_OUTPATIENT_CLINIC_OR_DEPARTMENT_OTHER): Payer: Self-pay

## 2022-06-11 MED ORDER — COMIRNATY 30 MCG/0.3ML IM SUSY
PREFILLED_SYRINGE | INTRAMUSCULAR | 0 refills | Status: DC
  Filled 2022-06-11: qty 0.3, 1d supply, fill #0

## 2022-06-11 MED ORDER — METHOTREXATE SODIUM 2.5 MG PO TABS
15.0000 mg | ORAL_TABLET | ORAL | 5 refills | Status: DC
  Filled 2022-06-11: qty 24, 28d supply, fill #0
  Filled 2022-07-14 – 2022-07-15 (×2): qty 24, 28d supply, fill #1
  Filled 2022-08-12: qty 24, 28d supply, fill #2
  Filled 2022-09-21: qty 24, 28d supply, fill #3

## 2022-06-11 MED ORDER — FLUARIX QUADRIVALENT 0.5 ML IM SUSY
PREFILLED_SYRINGE | INTRAMUSCULAR | 0 refills | Status: DC
Start: 2022-06-11 — End: 2022-08-10
  Filled 2022-06-11: qty 0.5, 1d supply, fill #0

## 2022-06-14 ENCOUNTER — Encounter (HOSPITAL_COMMUNITY): Payer: 59

## 2022-06-15 ENCOUNTER — Other Ambulatory Visit (HOSPITAL_BASED_OUTPATIENT_CLINIC_OR_DEPARTMENT_OTHER): Payer: Self-pay

## 2022-06-29 ENCOUNTER — Ambulatory Visit: Payer: 59 | Attending: Nurse Practitioner | Admitting: Nurse Practitioner

## 2022-06-29 ENCOUNTER — Encounter: Payer: Self-pay | Admitting: Physician Assistant

## 2022-06-29 ENCOUNTER — Encounter: Payer: Self-pay | Admitting: Nurse Practitioner

## 2022-06-29 VITALS — BP 102/68 | HR 70 | Ht 65.0 in | Wt 235.0 lb

## 2022-06-29 DIAGNOSIS — R600 Localized edema: Secondary | ICD-10-CM

## 2022-06-29 DIAGNOSIS — I4819 Other persistent atrial fibrillation: Secondary | ICD-10-CM

## 2022-06-29 DIAGNOSIS — I10 Essential (primary) hypertension: Secondary | ICD-10-CM | POA: Diagnosis not present

## 2022-06-29 NOTE — Progress Notes (Signed)
Office Visit    Patient Name: Monica [name] Date of Encounter: 06/29/2022  Primary Care Provider:  Orma Render, NP Primary Cardiologist:  Kirk Ruths, MD  Chief Complaint    66 year old female with a history of persistent atrial fibrillation, hypertension, lower extremity edema, and obesity who presents for follow-up related to atrial fibrillation.  Past Medical History    Past Medical History:  Diagnosis Date   Allergic conjunctivitis 03/12/2015   Atrial fibrillation ([place])    a. s/p DCCV in 12/2012 b. 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   History of AVN   Hypertension    IFG (impaired fasting glucose) 10/14/2013   Itchy eyes 03/12/2015   Morbid obesity ([place])    Osteoarthritis    Personal history of colonic polyps - large hyperplastic 12/25/2013   Pre-diabetes    Psoriasis    active breakout left buttocks   Right hip pain    Right knee pain 07/06/2016   Shortness of breath    Skin infection 10/14/2013   Sleep apnea    uses mouth guard only   Swelling of limb 11/30/2012   Tachycardia, unspecified    Past Surgical History:  Procedure Laterality Date   CARDIOVERSION N/A 01/09/2013   Procedure: CARDIOVERSION;  Surgeon: [name], MD;  Location: [facility];  Service: Cardiovascular;  Laterality: N/A;   CARDIOVERSION N/A 10/07/2014   Procedure: CARDIOVERSION;  Surgeon: [name], MD;  Location: [facility] ENDOSCOPY;  Service: Cardiovascular;  Laterality: N/A;  09:00 Lido 26m,IV followed by Propofol  753mIV    synched electrocardioversion at 120 joules for Afib,repeated at 200 joules, 70 mg...successfully changed to [place]/A  12/22/2021   Procedure: CARDIOVERSION;  Surgeon: ScDonato HeinzMD;  [facility];  Service: Cardiovascular;  Laterality: N/A;   FRACTURE SURGERY  07/21/12   right hip arthroplasty   JOINT REPLACEMENT     Left hip Dr. BlNinfa Linden-23-19   TOTAL HIP ARTHROPLASTY  07/21/2012   Procedure: TOTAL HIP ARTHROPLASTY;  Surgeon: FrGearlean AlfMD;  Location: [facility] ORS;  Service: Orthopedics;  Laterality: Right;   TOTAL HIP ARTHROPLASTY Left 04/07/2018   Procedure: LEFT TOTAL HIP ARTHROPLASTY ANTERIOR APPROACH;  Surgeon: BlMcarthur RossettiMD;  Location: [facility];  Service: Orthopedics;  Laterality: Left;  Needs RNFA    Allergies  No Known Allergies  History of Present Illness    [age]ear old female with the above past medical history including persistent atrial fibrillation, hypertension, lower extremity edema, and obesity.   She has a history of persistent atrial fibrillation s/p DCCV in May 2014 with restoration of NSR.  She developed recurrent atrial fibrillation and underwent repeat cardioversion successfully on flecainide.  Follow-up exercise treadmill and flecainide did not show any significant arrhythmia.  She has remained on flecainide and Xarelto.  At her office visit in January 2023 she was noted to be back in atrial fibrillation.  Echocardiogram in February 2023 showed normal LV function, severe left atrial enlargement, right atrial enlargement, mild mitral valve regurgitation, mild to moderate tricuspid valve regurgitation.  Follow-up visit in April 2023 she reported increased fatigue.  She remained  in atrial fibrillation. Flecainide was increased to 100 mg bid. She underwent repeat DCCV on 12/22/2021 with restoration of NSR. ETT was recommended in the setting of increased flecainide dosing. Unfortunately, when she presented for GXT for flecainide monitoring on 01/12/2022 she was noted to be back in atrial fibrillation. GXT was cancelled.    She was last seen in the office on  04/12/2022 and was stable from a cardiac standpoint.  Maintaining sinus rhythm.  Follow-up ETT was scheduled but was not completed.  She presents today for follow-up.  Since her last visit and since her procedure he has been stable from a cardiac standpoint. She denies any palpitations, dyspnea, edema, PND, orthopnea, weight gain.  Her ETT is scheduled for 07/13/2022.  Overall, she reports feeling well.  Home Medications    Current Outpatient Medications  Medication Sig Dispense Refill   acetaminophen (TYLENOL) 650 MG CR tablet Take 650-1,300 mg by mouth every 8 (eight) hours as needed for pain.     benzonatate (TESSALON) 100 MG capsule Take 1 capsule (100 mg total) by mouth 3 (three) times daily as needed. 30 capsule 0   Blood Pressure Monitor MISC Use to check blood pressure once daily 1 each 0   calcium carbonate (TUMS EX) 750 MG chewable tablet Chew 1-3 tablets by mouth 3 (three) times daily as needed for heartburn.     Cholecalciferol (VITAMIN D3) 2000 units TABS Take 2,000 Units by mouth every evening.     clobetasol ointment (TEMOVATE) 4.03 % Apply 1 application on the skin DAILY Apply daily to thick areas 60 g 0   COVID-19 At Home Antigen Test (CARESTART COVID-19 HOME TEST) KIT Use as directed per package instructions 8 each 0   COVID-19 mRNA vaccine 2023-2024 (COMIRNATY) syringe Inject into the muscle. 0.3 mL 0   desonide (DESOWEN) 0.05 % cream Apply 1 application topically 2 (two) times daily as needed (for psoriasis).   0   diclofenac sodium (VOLTAREN) 1 % GEL Apply 4 g topically 4 (four) times daily. (Patient taking differently: Apply 4 g topically 4 (four) times daily as needed (pain).) 5 Tube 1   Dulaglutide (TRULICITY) 3 KV/4.2VZ SOPN Inject 0.5 mLs (3 mg total) into the skin every 7 days. 2 mL 5   flecainide (TAMBOCOR) 100 MG tablet Take 1 tablet (100 mg total) by mouth 2 (two) times daily. 180 tablet 3   fluticasone (FLONASE) 50 MCG/ACT nasal spray PLACE 2 SPRAYS IN EACH NOSTRIL  EVERY DAY 16 g 3   folic acid (FOLVITE) 1 MG tablet TAKE 2 TABLETS BY MOUTH DAILY 60 tablet 3   hydrochlorothiazide (HYDRODIURIL) 25 MG tablet Take 1 tablet (25 mg total) by mouth every morning. 90 tablet 3   influenza vac split quadrivalent PF (FLUARIX QUADRIVALENT) 0.5 ML injection Inject into the muscle. 0.5 mL 0   methotrexate (RHEUMATREX) 2.5 MG tablet Take 6 tablet by mouth once a week 24 tablet 5   Multiple Vitamin (MULTIVITAMIN WITH MINERALS) TABS tablet Take 1 tablet by mouth daily.     nebivolol (BYSTOLIC) 10 MG tablet Take 0.5 tablets (5 mg total) by mouth daily. 45 tablet 3   potassium chloride (KLOR-CON) 10 MEQ tablet TAKE 2 TABLETS BY MOUTH TWICE DAILY 360 tablet 3   promethazine-dextromethorphan (PROMETHAZINE-DM) 6.25-15 MG/5ML syrup Take 5 mLs by mouth 4 (four) times daily as needed. 118 mL 0   Propylene Glycol-Glycerin 1-0.3 % SOLN Place 1 drop into both eyes 3 (three) times daily as needed (for dry eyes).  rivaroxaban (XARELTO) 20 MG TABS tablet Take 1 tablet (20 mg total) by mouth daily. 90 tablet 3   sertraline (ZOLOFT) 50 MG tablet Take 1.5 tablets (75 mg total) by mouth at bedtime. 145 tablet 3   No current facility-administered medications for this visit.     Review of Systems    She denies chest pain, palpitations, dyspnea, pnd, orthopnea, n, v, dizziness, syncope, edema, weight gain, or early satiety. All other systems reviewed and are otherwise negative except as noted above.   Physical Exam    VS:  BP 102/68 (BP Location: Left Arm, Patient Position: Sitting, Cuff Size: Large)   Pulse 70   Ht _0  (1.651 m)   Wt 235 lb (106.6 kg)   LMP 06/19/2013   BMI 39.11 kg/m  GEN: Well nourished, well developed, in no acute distress. HEENT: normal. Neck: Supple, no JVD, carotid bruits, or masses. Cardiac: RRR, no murmurs, rubs, or gallops. No clubbing, cyanosis, edema.  Radials/DP/PT 2+ and equal bilaterally.  Respiratory:  Respirations regular and unlabored, clear  to auscultation bilaterally. GI: Soft, nontender, nondistended, BS + x 4. MS: no deformity or atrophy. Skin: warm and dry, no rash. Neuro:  Strength and sensation are intact. Psych: Normal affect.  Accessory Clinical Findings    ECG personally reviewed by me today -no EKG in office today.   Lab Results  Component Value Date   WBC 5.6 03/26/2022   HGB 11.3 03/26/2022   HCT 34.3 03/26/2022   MCV 95 03/26/2022   PLT 332 03/26/2022   Lab Results  Component Value Date   CREATININE 0.84 03/26/2022   BUN 8 03/26/2022   NA 136 03/26/2022   K 6.2 (H) 03/26/2022   CL 98 03/26/2022   CO2 23 03/26/2022   Lab Results  Component Value Date   ALT 23 03/26/2022   AST 18 03/26/2022   ALKPHOS 137 (H) 03/26/2022   BILITOT 0.5 03/26/2022   Lab Results  Component Value Date   CHOL 146 03/26/2022   HDL 48 03/26/2022   LDLCALC 82 03/26/2022   LDLDIRECT 84 03/26/2022   TRIG 86 03/26/2022   CHOLHDL 2.9 09/10/2021    Lab Results  Component Value Date   HGBA1C 5.1 03/26/2022    Assessment & Plan   1. Persistent atrial fibrillation: Flecainide was increased in 11/2021 to 100 mg bid.  S/p DCCV in 12/2021.  Unable to complete ETT at the time as she was back in atrial fibrillation.  Maintaining NSR on exam.  Will reattempt ETT given increased dose of flecainide (this has been scheduled for 07/13/2022). Discussed possible referral to A-fib clinic, however, patient declines at this time.  If she does have recurrent atrial fibrillation, would recommend referral to A-fib clinic for further consideration of alternative antiarrhythmic therapy, possible ablation.  For now, continue flecainide, Bystolic, Xarelto.   2. Hypertension: BP well controlled. Continue current antihypertensive regimen.    3. Lower extremity edema: Echo in February 2023 showed normal LV function, severe left atrial enlargement, right atrial enlargement, mild mitral valve regurgitation, mild to moderate tricuspid valve  regurgitation. Euvolemic and well compensated on exam, edema has resolved. Continue hydrochlorothiazide.   4. Obesity: Encouraged increased activity as tolerated.   5. Disposition: Follow-up in 3 to 4 months.      Lenna Sciara, NP 06/29/2022, 5:35 PM

## 2022-06-29 NOTE — Patient Instructions (Signed)
Medication Instructions:  Your physician recommends that you continue on your current medications as directed. Please refer to the Current Medication list given to you today.   *If you need a refill on your cardiac medications before your next appointment, please call your pharmacy*   Lab Work: NONE ordered at this time of appointment   If you have labs (blood work) drawn today and your tests are completely normal, you will receive your results only by: [place] (if you have MyChart) OR A paper copy in the mail If you have any lab test that is abnormal or we need to change your treatment, we will call you to review the results.   Testing/Procedures: NONE ordered at this time of appointment    Follow-Up: At [facility], you and your health needs are our priority.  As part of our continuing mission to provide you with exceptional heart care, we have created designated Provider Care Teams.  These Care Teams include your primary Cardiologist (physician) and Advanced Practice Providers (APPs -  Physician Assistants and Nurse Practitioners) who all work together to provide you with the care you need, when you need it.  We recommend signing up for the patient portal called "MyChart".  Sign up information is provided on this After Visit Summary.  MyChart is used to connect with patients for Virtual Visits (Telemedicine).  Patients are able to view lab/test results, encounter notes, upcoming appointments, etc.  Non-urgent messages can be sent to your provider as well.   To learn more about what you can do with MyChart, go to [email or web address].    Your next appointment:   4 month(s)  The format for your next appointment:   In Person  Provider:   Kirk Ruths, MD     Other Instructions   Important Information About Sugar

## 2022-06-30 ENCOUNTER — Other Ambulatory Visit (HOSPITAL_BASED_OUTPATIENT_CLINIC_OR_DEPARTMENT_OTHER): Payer: Self-pay

## 2022-07-01 NOTE — Progress Notes (Deleted)
07/01/2022 Monica [name] [id number] 02/15/1956  Referring provider: Orma Render, NP Primary GI doctor: {acdocs:[id number]}  ASSESSMENT AND PLAN:   There are no diagnoses linked to this encounter.   Patient Care Team: Early, Monica Pesa, NP as PCP - General (Nurse Practitioner) [name], MD as PCP - Cardiology (Cardiology)  HISTORY OF PRESENT ILLNESS: 66 y.o. female with a past medical history of diabetes type 2 with hypertension on Trulicity, A-fib on Xarelto, psoriatic arthritis, morbid obesity, GERD and others listed below presents for evaluation of colonoscopy.  12/25/2013 colonoscopy Dr. Carlean Purl 2 sessile polyps 10 to 12 mm in size sigmoid colon excellent prep, hyperplastic polyps repeat 5 years.  She  reports that she quit smoking about 39 years ago. Her smoking use included cigarettes. She has never used smokeless tobacco. She reports current alcohol use. She reports that she does not use drugs.  Current Medications:   Current Outpatient Medications (Endocrine & Metabolic):    Dulaglutide (TRULICITY) 3 DJ/4.9FW SOPN, Inject 0.5 mLs (3 mg total) into the skin every 7 days.  Current Outpatient Medications (Cardiovascular):    flecainide (TAMBOCOR) 100 MG tablet, Take 1 tablet (100 mg total) by mouth 2 (two) times daily.   hydrochlorothiazide (HYDRODIURIL) 25 MG tablet, Take 1 tablet (25 mg total) by mouth every morning.   nebivolol (BYSTOLIC) 10 MG tablet, Take 0.5 tablets (5 mg total) by mouth daily.  Current Outpatient Medications (Respiratory):    benzonatate (TESSALON) 100 MG capsule, Take 1 capsule (100 mg total) by mouth 3 (three) times daily as needed.   fluticasone (FLONASE) 50 MCG/ACT nasal spray, PLACE 2 SPRAYS IN EACH NOSTRIL EVERY DAY   promethazine-dextromethorphan (PROMETHAZINE-DM) 6.25-15 MG/5ML syrup, Take 5 mLs by mouth 4 (four) times daily as needed.  Current Outpatient Medications (Analgesics):    acetaminophen (TYLENOL) 650 MG CR tablet, Take  650-1,300 mg by mouth every 8 (eight) hours as needed for pain.  Current Outpatient Medications (Hematological):    folic acid (FOLVITE) 1 MG tablet, TAKE 2 TABLETS BY MOUTH DAILY   rivaroxaban (XARELTO) 20 MG TABS tablet, Take 1 tablet (20 mg total) by mouth daily.  Current Outpatient Medications (Other):    Blood Pressure Monitor MISC, Use to check blood pressure once daily   calcium carbonate (TUMS EX) 750 MG chewable tablet, Chew 1-3 tablets by mouth 3 (three) times daily as needed for heartburn.   Cholecalciferol (VITAMIN D3) 2000 units TABS, Take 2,000 Units by mouth every evening.   clobetasol ointment (TEMOVATE) 2.63 %, Apply 1 application on the skin DAILY Apply daily to thick areas   COVID-19 At Home Antigen Test (CARESTART COVID-19 HOME TEST) KIT, Use as directed per package instructions   COVID-19 mRNA vaccine 2023-2024 (COMIRNATY) syringe, Inject into the muscle.   desonide (DESOWEN) 0.05 % cream, Apply 1 application topically 2 (two) times daily as needed (for psoriasis).    diclofenac sodium (VOLTAREN) 1 % GEL, Apply 4 g topically 4 (four) times daily. (Patient taking differently: Apply 4 g topically 4 (four) times daily as needed (pain).)   influenza vac split quadrivalent PF (FLUARIX QUADRIVALENT) 0.5 ML injection, Inject into the muscle.   methotrexate (RHEUMATREX) 2.5 MG tablet, Take 6 tablet by mouth once a week   Multiple Vitamin (MULTIVITAMIN WITH MINERALS) TABS tablet, Take 1 tablet by mouth daily.   potassium chloride (KLOR-CON) 10 MEQ tablet, TAKE 2 TABLETS BY MOUTH TWICE DAILY   Propylene Glycol-Glycerin 1-0.3 % SOLN, Place 1 drop into both eyes  3 (three) times daily as needed (for dry eyes).   sertraline (ZOLOFT) 50 MG tablet, Take 1.5 tablets (75 mg total) by mouth at bedtime.  Medical History:  Past Medical History:  Diagnosis Date   Allergic conjunctivitis 03/12/2015   Atrial fibrillation ([place])    a. s/p DCCV in 12/2012 b. 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   History of AVN   Hypertension    IFG (impaired fasting glucose) 10/14/2013   Itchy eyes 03/12/2015   Morbid obesity ([place])    Osteoarthritis    Personal history of colonic polyps - large hyperplastic 12/25/2013   Pre-diabetes    Psoriasis    active breakout left buttocks   Right hip pain    Right knee pain 07/06/2016   Shortness of breath    Skin infection 10/14/2013   Sleep apnea    uses mouth guard only   Swelling of limb 11/30/2012   Tachycardia, unspecified    Allergies: No Known Allergies   Surgical History:  She  has a past surgical history that includes Total hip arthroplasty (07/21/2012); Fracture surgery (07/21/12); Cardioversion (N/A, 01/09/2013); Cardioversion (N/A, 10/07/2014); Joint replacement; Total hip arthroplasty (Left, 04/07/2018); and Cardioversion (N/A, 12/22/2021). Family History:  Her family history includes Alzheimer's disease in her mother; Diabetes in her mother and son; Hypertension in her mother; Thyroid disease in her mother.  REVIEW OF SYSTEMS  : All other systems reviewed and negative except where noted in the History of Present Illness.  PHYSICAL EXAM: LMP 06/19/2013  General:   Pleasant, well developed female in no acute distress Head:   Normocephalic and atraumatic. Eyes:  sclerae anicteric,conjunctive pink  Heart:   {HEART EXAM HEM/ONC:21750} Pulm:  Clear anteriorly; no wheezing Abdomen:   {BlankSingle:19197::"Distended","Ridged","Soft"}, {BlankSingle:19197::"Flat","Obese","Non-distended"} AB, {BlankSingle:19197::"Absent","Hyperactive, tinkling","Hypoactive","Sluggish","Active"} bowel sounds. {actendernessAB:27319} tenderness  {anatomy; site abdomen:5010}. {BlankMultiple:19196::"Without guarding","With guarding","Without rebound","With rebound"}, No organomegaly appreciated. Rectal: {acrectalexam:27461} Extremities:  {With/Without:[id number]} edema. Msk: Symmetrical without gross deformities. Peripheral pulses intact.  Neurologic:  Alert and  oriented x4;  No focal deficits.  Skin:   Dry and intact without significant lesions or rashes. Psychiatric:  Cooperative. Normal mood and affect.    Vladimir Crofts, PA-C 12:41 PM

## 2022-07-02 ENCOUNTER — Ambulatory Visit: Payer: 59 | Admitting: Physician Assistant

## 2022-07-06 ENCOUNTER — Other Ambulatory Visit (HOSPITAL_BASED_OUTPATIENT_CLINIC_OR_DEPARTMENT_OTHER): Payer: Self-pay

## 2022-07-07 ENCOUNTER — Telehealth (HOSPITAL_COMMUNITY): Payer: Self-pay | Admitting: *Deleted

## 2022-07-07 NOTE — Telephone Encounter (Signed)
Left message on voicemail in reference to upcoming appointment scheduled for 07/13/22 Phone number given for a call back so details instructions can be given.  Monica [name]

## 2022-07-13 ENCOUNTER — Telehealth: Payer: Self-pay | Admitting: Cardiology

## 2022-07-13 ENCOUNTER — Encounter (HOSPITAL_COMMUNITY): Payer: 59

## 2022-07-13 NOTE — Telephone Encounter (Signed)
Pt is requesting call back to get Cup ETT rescheduled.

## 2022-07-14 ENCOUNTER — Other Ambulatory Visit: Payer: Self-pay

## 2022-07-15 ENCOUNTER — Other Ambulatory Visit (HOSPITAL_BASED_OUTPATIENT_CLINIC_OR_DEPARTMENT_OTHER): Payer: Self-pay

## 2022-07-16 ENCOUNTER — Other Ambulatory Visit: Payer: Self-pay

## 2022-07-20 ENCOUNTER — Telehealth: Payer: Self-pay | Admitting: Cardiology

## 2022-07-20 NOTE — Telephone Encounter (Signed)
Patient's primary care provider is leaving and she needs a new pcp and has heard a lot of good things about Dr. Nani Ravens and wants to know if he can take her on as a new patient. 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 Please advise.

## 2022-07-20 NOTE — Telephone Encounter (Signed)
OK 

## 2022-07-26 ENCOUNTER — Other Ambulatory Visit (HOSPITAL_BASED_OUTPATIENT_CLINIC_OR_DEPARTMENT_OTHER): Payer: Self-pay

## 2022-08-05 ENCOUNTER — Telehealth (HOSPITAL_BASED_OUTPATIENT_CLINIC_OR_DEPARTMENT_OTHER): Payer: Self-pay

## 2022-08-10 ENCOUNTER — Encounter: Payer: Self-pay | Admitting: Family Medicine

## 2022-08-10 ENCOUNTER — Ambulatory Visit: Payer: 59 | Admitting: Family Medicine

## 2022-08-10 ENCOUNTER — Other Ambulatory Visit (HOSPITAL_BASED_OUTPATIENT_CLINIC_OR_DEPARTMENT_OTHER): Payer: Self-pay

## 2022-08-10 VITALS — BP 118/70 | HR 60 | Temp 98.0°F | Resp 18 | Ht 65.0 in | Wt 240.2 lb

## 2022-08-10 DIAGNOSIS — G4733 Obstructive sleep apnea (adult) (pediatric): Secondary | ICD-10-CM | POA: Diagnosis not present

## 2022-08-10 DIAGNOSIS — E1159 Type 2 diabetes mellitus with other circulatory complications: Secondary | ICD-10-CM | POA: Diagnosis not present

## 2022-08-10 DIAGNOSIS — H9313 Tinnitus, bilateral: Secondary | ICD-10-CM | POA: Diagnosis not present

## 2022-08-10 DIAGNOSIS — F411 Generalized anxiety disorder: Secondary | ICD-10-CM | POA: Diagnosis not present

## 2022-08-10 DIAGNOSIS — I4819 Other persistent atrial fibrillation: Secondary | ICD-10-CM

## 2022-08-10 DIAGNOSIS — R609 Edema, unspecified: Secondary | ICD-10-CM

## 2022-08-10 DIAGNOSIS — E1169 Type 2 diabetes mellitus with other specified complication: Secondary | ICD-10-CM

## 2022-08-10 DIAGNOSIS — I152 Hypertension secondary to endocrine disorders: Secondary | ICD-10-CM | POA: Diagnosis not present

## 2022-08-10 MED ORDER — NEBIVOLOL HCL 10 MG PO TABS
5.0000 mg | ORAL_TABLET | Freq: Every day | ORAL | 3 refills | Status: DC
  Filled 2022-08-10: qty 45, 90d supply, fill #0

## 2022-08-10 MED ORDER — TRULICITY 3 MG/0.5ML ~~LOC~~ SOAJ
SUBCUTANEOUS | 3 refills | Status: DC
  Filled 2022-08-10 – 2022-09-21 (×2): qty 2, 28d supply, fill #0
  Filled 2022-10-28: qty 2, 28d supply, fill #1
  Filled 2022-12-14: qty 2, 28d supply, fill #2
  Filled 2023-01-21: qty 2, 28d supply, fill #3
  Filled 2023-02-16: qty 2, 28d supply, fill #4
  Filled 2023-04-26: qty 2, 28d supply, fill #5
  Filled 2023-06-01: qty 2, 28d supply, fill #6
  Filled 2023-07-07 (×2): qty 2, 28d supply, fill #7
  Filled 2023-08-03: qty 2, 28d supply, fill #8

## 2022-08-10 MED ORDER — HYDROCHLOROTHIAZIDE 25 MG PO TABS
25.0000 mg | ORAL_TABLET | Freq: Every morning | ORAL | 3 refills | Status: DC
  Filled 2022-08-10 – 2022-12-14 (×2): qty 90, 90d supply, fill #0
  Filled 2023-03-23: qty 90, 90d supply, fill #1
  Filled 2023-07-07: qty 90, 90d supply, fill #2

## 2022-08-10 MED ORDER — SERTRALINE HCL 50 MG PO TABS
75.0000 mg | ORAL_TABLET | Freq: Every day | ORAL | 3 refills | Status: DC
  Filled 2022-08-10 – 2022-10-28 (×2): qty 135, 90d supply, fill #0
  Filled 2023-04-07: qty 135, 90d supply, fill #1

## 2022-08-10 NOTE — Progress Notes (Signed)
Chief Complaint  Patient presents with   New Patient (Initial Visit)    Pt states wanting to establish care and states wanting to talk about sleep apnea and tinnitus        New Patient Visit SUBJECTIVE: HPI: Monica [name] is an 66 y.o.female who is being seen for establishing care.  The patient was previously seen at [facility].  Hypertension Patient presents for hypertension follow up. She does not monitor home blood pressures. She is compliant with medication-Bystolic 5 mg daily. Patient has these side effects of medication: none She is usually adhering to a healthy diet overall. Exercise: Walking  GAD-patient has a history of tinnitus for which she was put on Zoloft for.  She is currently taking 75 mg daily.  She reports compliance and no adverse effects.  She is not following with a therapist.  No homicidal or suicidal ideation.  No self-medication.  Zoloft did not do anything for the tinnitus but did help with her other stress levels.  She wishes to stay on it.  She has a history of sleep apnea, last sleep study done reportedly 3 years ago.  Her previous PCP referred her to the sleep team but she never received a call.  Per records, her voicemail box was full.  She was given the oral appliance but was not always compliant with it.  She did not want to use a CPAP machine.  Past Medical History:  Diagnosis Date   Allergic conjunctivitis 03/12/2015   Atrial fibrillation ([place])    a. s/p DCCV in 12/2012 b. 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   History of AVN   Hypertension    IFG (impaired fasting glucose) 10/14/2013   Itchy eyes 03/12/2015   Morbid obesity ([place])     Osteoarthritis    Personal history of colonic polyps - large hyperplastic 12/25/2013   Pre-diabetes    Psoriasis    active breakout left buttocks   Right hip pain    Right knee pain 07/06/2016   Shortness of breath    Skin infection 10/14/2013   Sleep apnea    uses mouth guard only   Swelling of limb 11/30/2012   Tachycardia, unspecified    Past Surgical History:  Procedure Laterality Date   CARDIOVERSION N/A 01/09/2013   Procedure: CARDIOVERSION;  Surgeon: [name], MD;  Location: [place];  Service: Cardiovascular;  Laterality: N/A;   CARDIOVERSION N/A 10/07/2014   Procedure: CARDIOVERSION;  Surgeon: [name], MD;  Location: [facility] ENDOSCOPY;  Service: Cardiovascular;  Laterality: N/A;  09:00 Lido '60mg'$ ,IV followed by Propofol  '70mg'$ /IV    synched electrocardioversion at 120 joules for Afib,repeated at 200 joules, 70 mg...successfully changed to [place] N/A 12/22/2021   Procedure: CARDIOVERSION;  Surgeon: Donato Heinz, MD;  Location: [facility] ENDOSCOPY;  Service: Cardiovascular;  Laterality: N/A;   FRACTURE SURGERY  07/21/12   right hip arthroplasty   JOINT REPLACEMENT     Left hip Dr. [name] 04-07-18   TOTAL HIP ARTHROPLASTY  07/21/2012   Procedure: TOTAL HIP ARTHROPLASTY;  Surgeon: [name], MD;  Location: [facility] ORS;  Service: Orthopedics;  Laterality:  Right;   TOTAL HIP ARTHROPLASTY Left 04/07/2018   Procedure: LEFT TOTAL HIP ARTHROPLASTY ANTERIOR APPROACH;  Surgeon: [name], MD;  Location: [facility] ORS;  Service: Orthopedics;  Laterality: Left;  Needs RNFA   Family History  Problem Relation Age of Onset   Hypertension Mother    Diabetes Mother    Alzheimer's disease Mother    Thyroid disease Mother    Diabetes Son    No Known Allergies  Current Outpatient Medications:    acetaminophen (TYLENOL) 650 MG CR tablet, Take 650-1,300 mg by mouth every 8 (eight) hours as needed for pain., Disp: , Rfl:    Blood Pressure Monitor MISC, Use to  check blood pressure once daily, Disp: 1 each, Rfl: 0   calcium carbonate (TUMS EX) 750 MG chewable tablet, Chew 1-3 tablets by mouth 3 (three) times daily as needed for heartburn., Disp: , Rfl:    Cholecalciferol (VITAMIN D3) 2000 units TABS, Take 2,000 Units by mouth every evening., Disp: , Rfl:    clobetasol ointment (TEMOVATE) 3.14 %, Apply 1 application on the skin DAILY Apply daily to thick areas, Disp: 60 g, Rfl: 0   desonide (DESOWEN) 0.05 % cream, Apply 1 application topically 2 (two) times daily as needed (for psoriasis). , Disp: , Rfl: 0   diclofenac sodium (VOLTAREN) 1 % GEL, Apply 4 g topically 4 (four) times daily. (Patient taking differently: Apply 4 g topically 4 (four) times daily as needed (pain).), Disp: 5 Tube, Rfl: 1   flecainide (TAMBOCOR) 100 MG tablet, Take 1 tablet (100 mg total) by mouth 2 (two) times daily., Disp: 180 tablet, Rfl: 3   fluticasone (FLONASE) 50 MCG/ACT nasal spray, PLACE 2 SPRAYS IN EACH NOSTRIL EVERY DAY, Disp: 16 g, Rfl: 3   folic acid (FOLVITE) 1 MG tablet, TAKE 2 TABLETS BY MOUTH DAILY, Disp: 60 tablet, Rfl: 3   methotrexate (RHEUMATREX) 2.5 MG tablet, Take 6 tablet by mouth once a week, Disp: 24 tablet, Rfl: 5   methotrexate (RHEUMATREX) 2.5 MG tablet, Take 6 tablets (15 mg total) by mouth once a week., Disp: 24 tablet, Rfl: 5   Multiple Vitamin (MULTIVITAMIN WITH MINERALS) TABS tablet, Take 1 tablet by mouth daily., Disp: , Rfl:    potassium chloride (KLOR-CON) 10 MEQ tablet, TAKE 2 TABLETS BY MOUTH TWICE DAILY, Disp: 360 tablet, Rfl: 3   Propylene Glycol-Glycerin 1-0.3 % SOLN, Place 1 drop into both eyes 3 (three) times daily as needed (for dry eyes)., Disp: , Rfl:    rivaroxaban (XARELTO) 20 MG TABS tablet, Take 1 tablet (20 mg total) by mouth daily., Disp: 90 tablet, Rfl: 3   Dulaglutide (TRULICITY) 3 HF/0.2OV SOPN, Inject 0.5 mLs (3 mg total) into the skin every 7 days., Disp: 6 mL, Rfl: 3   hydrochlorothiazide (HYDRODIURIL) 25 MG tablet, Take 1  tablet (25 mg total) by mouth every morning., Disp: 90 tablet, Rfl: 3   nebivolol (BYSTOLIC) 10 MG tablet, Take 0.5 tablets (5 mg total) by mouth daily., Disp: 45 tablet, Rfl: 3   sertraline (ZOLOFT) 50 MG tablet, Take 1.5 tablets (75 mg total) by mouth at bedtime., Disp: 135 tablet, Rfl: 3  OBJECTIVE: BP 118/70 (BP Location: Right Arm, Patient Position: Sitting, Cuff Size: Large)   Pulse 60   Temp 98 F (36.7 C) (Oral)   Resp 18   Ht '5\' 5"'$  (1.651 m)   Wt 240 lb 3.2 oz (109 kg)   LMP 06/19/2013   SpO2 98%   BMI 39.97 kg/m  General:  well developed, well nourished, in no apparent distress Skin:  no significant moles, warts, or growths Nose:  nares patent, septum midline, mucosa normal Throat/Pharynx:  lips and gingiva without lesion; tongue and uvula midline; non-inflamed pharynx; no exudates or postnasal drainage, Mallampati 2 Lungs:  clear to auscultation, breath sounds equal bilaterally, no respiratory distress Cardio:  regular rate and rhythm, no LE edema or bruits Musculoskeletal:  symmetrical muscle groups noted without atrophy or deformity Neuro:  gait normal Psych: well oriented with normal range of affect and appropriate judgment/insight  ASSESSMENT/PLAN: Hypertension associated with diabetes ([place]) - Plan: nebivolol (BYSTOLIC) 10 MG tablet  GAD (generalized anxiety disorder) - Plan: sertraline (ZOLOFT) 50 MG tablet  OSA (obstructive sleep apnea)  Edema, unspecified type - Plan: hydrochlorothiazide (HYDRODIURIL) 25 MG tablet  Tinnitus of both ears  Persistent atrial fibrillation (H[facility]) - Plan: nebivolol (BYSTOLIC) 10 MG tablet  Type 2 diabetes mellitus with other specified complication, without long-term current use of insulin (H[facility]) - Plan: Dulaglutide (TRULICITY) 3 LK/9.5FM SOPN  Chronic, currently stable.  Continue Bystolic 5 mg daily.  Patient really likes her Bystolic.  She does not want do anything that could cause tongue swelling as she does work in the emergency  department.  With a history of diabetes, would offer more kidney protection with an ARB but she would like to see what her insurance will cover and centimeters only the Bystolic if possible. Chronic, stable.  Continue Zoloft 75 mg daily. Encouraged her to reach out to the sleep team. Okay to continue hydrochlorothiazide as needed for this. Reassurance. Patient should return in 3 mo for DM visit. The patient voiced understanding and agreement to the plan.   [employer], DO 08/10/22  4:44 PM

## 2022-08-10 NOTE — Patient Instructions (Addendum)
Keep the diet clean and stay active.  Aim to do some physical exertion for 150 minutes per week. This is typically divided into 5 days per week, 30 minutes per day. The activity should be enough to get your heart rate up. Anything is better than nothing if you have time constraints.  Please complete the Cologard.   Let us know if you need anything.

## 2022-08-11 ENCOUNTER — Telehealth: Payer: Self-pay | Admitting: Nurse Practitioner

## 2022-08-11 ENCOUNTER — Telehealth (HOSPITAL_BASED_OUTPATIENT_CLINIC_OR_DEPARTMENT_OTHER): Payer: Self-pay

## 2022-08-11 DIAGNOSIS — I4819 Other persistent atrial fibrillation: Secondary | ICD-10-CM

## 2022-08-11 DIAGNOSIS — Z5181 Encounter for therapeutic drug level monitoring: Secondary | ICD-10-CM

## 2022-08-11 NOTE — Telephone Encounter (Signed)
Order re-entered.  Sent to scheduling to arrange

## 2022-08-11 NOTE — Telephone Encounter (Signed)
Pt would like to r/s her stress test however her orders expires tomorrow so I cannot sch after that.

## 2022-08-12 ENCOUNTER — Other Ambulatory Visit (HOSPITAL_BASED_OUTPATIENT_CLINIC_OR_DEPARTMENT_OTHER): Payer: Self-pay

## 2022-08-13 ENCOUNTER — Other Ambulatory Visit (HOSPITAL_BASED_OUTPATIENT_CLINIC_OR_DEPARTMENT_OTHER): Payer: Self-pay

## 2022-08-13 ENCOUNTER — Telehealth: Payer: Self-pay | Admitting: Family Medicine

## 2022-08-13 NOTE — Telephone Encounter (Signed)
Pt called stating that she is unable to get her trulicity refilled due to her not being completely out per insurance. Pt stated she has one injection left and would like to get it before the year rolls over. Pt stated if she has a paper script written out for a different dosage, she would be able to get it. Advised a message would be sent back to see about getting this sorted for her. Pt acknowledged understanding.

## 2022-08-17 NOTE — Telephone Encounter (Signed)
Called left detailed message if still in need of paper script to call us back to request it.

## 2022-09-09 ENCOUNTER — Telehealth: Payer: Self-pay | Admitting: Nurse Practitioner

## 2022-09-09 LAB — COLOGUARD

## 2022-09-09 NOTE — Telephone Encounter (Signed)
[facility] medical records request received and forwarded to [facility]

## 2022-09-21 ENCOUNTER — Other Ambulatory Visit: Payer: Self-pay | Admitting: Cardiology

## 2022-09-21 ENCOUNTER — Other Ambulatory Visit: Payer: Self-pay | Admitting: Family Medicine

## 2022-09-21 ENCOUNTER — Other Ambulatory Visit (HOSPITAL_BASED_OUTPATIENT_CLINIC_OR_DEPARTMENT_OTHER): Payer: Self-pay

## 2022-09-21 DIAGNOSIS — I480 Paroxysmal atrial fibrillation: Secondary | ICD-10-CM

## 2022-09-21 MED ORDER — RIVAROXABAN 20 MG PO TABS
20.0000 mg | ORAL_TABLET | Freq: Every day | ORAL | 3 refills | Status: DC
  Filled 2022-09-21: qty 90, 90d supply, fill #0
  Filled 2023-01-20: qty 90, 90d supply, fill #1
  Filled 2023-03-23 – 2023-04-07 (×2): qty 90, 90d supply, fill #2

## 2022-09-22 ENCOUNTER — Other Ambulatory Visit (HOSPITAL_BASED_OUTPATIENT_CLINIC_OR_DEPARTMENT_OTHER): Payer: Self-pay

## 2022-09-22 ENCOUNTER — Telehealth: Payer: Self-pay

## 2022-09-22 NOTE — Telephone Encounter (Signed)
PA initiated via Covermymeds; KEY: [id number]. Awaiting determination

## 2022-09-22 NOTE — Telephone Encounter (Signed)
PA approved.   [id number];Review Type:Prior Auth;Coverage Start Date:09/22/2022;Coverage End Date:09/22/2023;

## 2022-09-22 NOTE — Telephone Encounter (Signed)
Called the patient left a detailed message prescription Xarelto has been approved,

## 2022-09-22 NOTE — Telephone Encounter (Signed)
PA initiated via Covermymeds; KEY: [id number]. PA approved.   [id number]:[id number];Review Type:Prior Auth;Coverage Start Date:09/22/2022;Coverage End Date:09/22/2023;

## 2022-09-27 ENCOUNTER — Ambulatory Visit (HOSPITAL_BASED_OUTPATIENT_CLINIC_OR_DEPARTMENT_OTHER): Payer: 59 | Admitting: Nurse Practitioner

## 2022-10-05 ENCOUNTER — Other Ambulatory Visit (HOSPITAL_BASED_OUTPATIENT_CLINIC_OR_DEPARTMENT_OTHER): Payer: Self-pay

## 2022-10-06 ENCOUNTER — Other Ambulatory Visit (HOSPITAL_COMMUNITY): Payer: Self-pay

## 2022-10-11 ENCOUNTER — Encounter (HOSPITAL_COMMUNITY): Payer: Commercial Managed Care - PPO

## 2022-10-20 ENCOUNTER — Ambulatory Visit: Payer: Commercial Managed Care - PPO | Attending: Cardiology

## 2022-10-20 DIAGNOSIS — Z79899 Other long term (current) drug therapy: Secondary | ICD-10-CM

## 2022-10-20 DIAGNOSIS — I4819 Other persistent atrial fibrillation: Secondary | ICD-10-CM

## 2022-10-20 DIAGNOSIS — Z5181 Encounter for therapeutic drug level monitoring: Secondary | ICD-10-CM

## 2022-10-20 LAB — EXERCISE TOLERANCE TEST
Angina Index: 0
Estimated workload: 5.8
Exercise duration (min): 4 min
Exercise duration (sec): 0 s
MPHR: 156 {beats}/min
Peak HR: 103 {beats}/min
Percent HR: 66 %
RPE: 15
Rest HR: 57 {beats}/min

## 2022-10-25 ENCOUNTER — Ambulatory Visit: Payer: Commercial Managed Care - PPO | Attending: Nurse Practitioner | Admitting: Nurse Practitioner

## 2022-10-25 ENCOUNTER — Telehealth: Payer: Self-pay

## 2022-10-25 ENCOUNTER — Other Ambulatory Visit (HOSPITAL_BASED_OUTPATIENT_CLINIC_OR_DEPARTMENT_OTHER): Payer: Self-pay

## 2022-10-25 ENCOUNTER — Encounter: Payer: Self-pay | Admitting: Nurse Practitioner

## 2022-10-25 VITALS — BP 112/82 | HR 65 | Ht 65.5 in | Wt 236.6 lb

## 2022-10-25 DIAGNOSIS — I4819 Other persistent atrial fibrillation: Secondary | ICD-10-CM

## 2022-10-25 DIAGNOSIS — R600 Localized edema: Secondary | ICD-10-CM

## 2022-10-25 DIAGNOSIS — E1159 Type 2 diabetes mellitus with other circulatory complications: Secondary | ICD-10-CM

## 2022-10-25 DIAGNOSIS — I10 Essential (primary) hypertension: Secondary | ICD-10-CM

## 2022-10-25 DIAGNOSIS — I152 Hypertension secondary to endocrine disorders: Secondary | ICD-10-CM

## 2022-10-25 MED ORDER — NEBIVOLOL HCL 10 MG PO TABS
5.0000 mg | ORAL_TABLET | Freq: Every day | ORAL | 3 refills | Status: DC
  Filled 2022-10-25: qty 45, 90d supply, fill #0
  Filled 2023-01-20: qty 45, 90d supply, fill #1

## 2022-10-25 NOTE — Progress Notes (Signed)
Office Visit    Patient Name: Monica [name] Date of Encounter: 10/25/2022  Primary Care Provider:  Shelda Pal, DO Primary Cardiologist:  Kirk Ruths, MD  Chief Complaint    67 year old female with a history of persistent atrial fibrillation, hypertension, lower extremity edema, and obesity who presents for follow-up related to atrial fibrillation.   Past Medical History    Past Medical History:  Diagnosis Date   Allergic conjunctivitis 03/12/2015   Atrial fibrillation ([place])    a. s/p DCCV in 12/2012 b. repeat DCCV in 09/2014 - started on Flecainide   Avascular necrosis of bone of right hip ([place])    Backache 06/05/2013   Candidiasis of female genitalia 10/14/2013   Chronic anticoagulation 11/19/2016   Cough 06/20/2013   Dysrhythmia    a fib   Essential hypertension, benign 11/30/2012   Headache(784.0) 11/30/2012   History of total hip replacement, right 11/17/2017   History of AVN   Hypertension    IFG (impaired fasting glucose) 10/14/2013   Itchy eyes 03/12/2015   Morbid obesity ([place])    Osteoarthritis    Personal history of colonic polyps - large hyperplastic 12/25/2013   Pre-diabetes    Psoriasis    active breakout left buttocks   Right hip pain    Right knee pain 07/06/2016   Shortness of breath    Skin infection 10/14/2013   Sleep apnea    uses mouth guard only   Swelling of limb 11/30/2012   Tachycardia, unspecified    Past Surgical History:  Procedure Laterality Date   CARDIOVERSION N/A 01/09/2013   Procedure: CARDIOVERSION;  Surgeon: [name], MD;  Location: [facility];  Service: Cardiovascular;  Laterality: N/A;   CARDIOVERSION N/A 10/07/2014   Procedure: CARDIOVERSION;  Surgeon: [name], MD;  Location: [facility] ENDOSCOPY;  Service: Cardiovascular;  Laterality: N/A;  09:00 Lido '60mg'$ ,IV followed by Propofol  '70mg'$ /IV    synched electrocardioversion at 120 joules for Afib,repeated at 200 joules, 70 mg...successfully changed to [place] N/A 12/22/2021   Procedure: CARDIOVERSION;  Surgeon: Donato Heinz, MD;  Location: [facility] ENDOSCOPY;  Service: Cardiovascular;  Laterality: N/A;   FRACTURE SURGERY  07/21/12   right hip arthroplasty   JOINT REPLACEMENT     Left hip Dr. [name] 04-07-18   TOTAL HIP ARTHROPLASTY  07/21/2012   Procedure: TOTAL HIP ARTHROPLASTY;  Surgeon: [name], MD;  Location: [facility] ORS;  Service: Orthopedics;  Laterality: Right;   TOTAL HIP ARTHROPLASTY Left 04/07/2018   Procedure: LEFT TOTAL HIP ARTHROPLASTY ANTERIOR APPROACH;  Surgeon: [name], MD;  Location: [facility] ORS;  Service: Orthopedics;  Laterality: Left;  Needs RNFA    Allergies  No Known Allergies   Labs/Other Studies Reviewed    The following studies were reviewed today: ETT 11/12/22:    The patient exercised according to the [facility] protocol for 4:71mn achieving 5.8 METs   Target HR was not achieved (max HR 103bpm; 66% MPHR)   Baseline ECG with sinus bradycardia with episodes of AVB and baseline T-wave inversions   Study nondiagnostic due to resting T-wave inversions on baseline ECG abnormalities and target HR not achieved.   No exercise induced arrhythmias or QRS widening noted during stress testing   Echo 09/2019: IMPRESSIONS    1. Normal LV systolic function; mild LVH and LVE; mild MR; severe LAE;  mild TR; mild pulmonary hypertension.   2. Left ventricular ejection fraction, by estimation, is 55 to 60%. The  left  ventricle has normal function. The left ventrical has no regional  wall motion abnormalities. The left ventricular internal cavity size was  mildly dilated. There is mildly  increased left ventricular hypertrophy. Left ventricular diastolic  parameters are indeterminate.   3. Right ventricular systolic function is normal. The right ventricular  size is normal. There is mildly elevated pulmonary artery systolic  pressure.   4. Left atrial size was severely dilated.   5. The mitral valve  is normal in structure and function. Mild mitral  valve regurgitation. No evidence of mitral stenosis.   6. The aortic valve is tricuspid. Aortic valve regurgitation is not  visualized. No aortic stenosis is present.   7. The inferior vena cava is normal in size with greater than 50%  respiratory variability, suggesting right atrial pressure of 3 mmHg.   Recent Labs: 03/26/2022: ALT 23; BUN 8; Creatinine, Ser 0.84; Hemoglobin 11.3; Platelets 332; Potassium 6.2; Sodium 136  Recent Lipid Panel    Component Value Date/Time   CHOL 146 03/26/2022 0811   TRIG 86 03/26/2022 0811   HDL 48 03/26/2022 0811   CHOLHDL 2.9 09/10/2021 1257   CHOLHDL 3.5 07/24/2013 0922   VLDL 20 07/24/2013 0922   LDLCALC 82 03/26/2022 0811   LDLDIRECT 84 03/26/2022 0811    History of Present Illness    67 year old female with the above past medical history including persistent atrial fibrillation, hypertension, lower extremity edema, and obesity.   She has a history of persistent atrial fibrillation s/p DCCV in May 2014 with restoration of NSR.  She developed recurrent atrial fibrillation and underwent repeat cardioversion successfully on flecainide.  Follow-up exercise treadmill and flecainide did not show any significant arrhythmia.  She has remained on flecainide and Xarelto.  At her office visit in January 2023 she was noted to be back in atrial fibrillation.  Echocardiogram in February 2023 showed normal LV function, severe left atrial enlargement, right atrial enlargement, mild mitral valve regurgitation, mild to moderate tricuspid valve regurgitation.  At her follow-up visit in April 2023 she reported increased fatigue.  She remained in atrial fibrillation. Flecainide was increased to 100 mg bid. She underwent repeat DCCV on 12/22/2021 with restoration of NSR. ETT was recommended in the setting of increased flecainide dosing. Unfortunately, when she presented for GXT for flecainide monitoring on 01/12/2022 she was  noted to be back in atrial fibrillation. GXT was cancelled. She was last seen in the office on 06/29/2022 and was stable from a cardiac standpoint.  She was maintaining sinus rhythm. Follow-up ETT showed no exercise induced arrhythmia though target heart rate was not achieved, considered nondiagnostic due to baseline EKG was sinus bradycardia, AVB, resting T wave inversions.  She presents today for follow-up.  Since her last visit she has well from a cardiac standpoint.  She denies any palpitations, dizziness, chest pain, dyspnea, edema, PND, orthopnea, weight gain.  BP has been well-controlled.  She was notified that her insurance would no longer be covering Bystolic.  She was told she may need prior authorization.  Overall, she reports feeling well.  Home Medications    Current Outpatient Medications  Medication Sig Dispense Refill   acetaminophen (TYLENOL) 650 MG CR tablet Take 650-1,300 mg by mouth every 8 (eight) hours as needed for pain.     Blood Pressure Monitor MISC Use to check blood pressure once daily 1 each 0   calcium carbonate (TUMS EX) 750 MG chewable tablet Chew 1-3 tablets by mouth 3 (three) times daily as needed  for heartburn.     Cholecalciferol (VITAMIN D3) 2000 units TABS Take 2,000 Units by mouth every evening.     clobetasol ointment (TEMOVATE) [id number] % Apply 1 application on the skin DAILY Apply daily to thick areas 60 g 0   desonide (DESOWEN) 0.05 % cream Apply 1 application topically 2 (two) times daily as needed (for psoriasis).   0   diclofenac sodium (VOLTAREN) 1 % GEL Apply 4 g topically 4 (four) times daily. (Patient taking differently: Apply 4 g topically 4 (four) times daily as needed (pain).) 5 Tube 1   Dulaglutide (TRULICITY) 3 0000000 SOPN Inject 0.5 mLs (3 mg total) into the skin every 7 days. 6 mL 3   flecainide (TAMBOCOR) 100 MG tablet Take 1 tablet (100 mg total) by mouth 2 (two) times daily. 180 tablet 3   fluticasone (FLONASE) 50 MCG/ACT nasal spray PLACE 2  SPRAYS IN EACH NOSTRIL EVERY DAY 16 g 3   folic acid (FOLVITE) 1 MG tablet TAKE 2 TABLETS BY MOUTH DAILY 60 tablet 3   hydrochlorothiazide (HYDRODIURIL) 25 MG tablet Take 1 tablet (25 mg total) by mouth every morning. 90 tablet 3   methotrexate (RHEUMATREX) 2.5 MG tablet Take 6 tablet by mouth once a week (Patient taking differently: Take 2.5 mg by mouth once a week. Pt. Takes 5 tablets once a week.) 24 tablet 5   Multiple Vitamin (MULTIVITAMIN WITH MINERALS) TABS tablet Take 1 tablet by mouth daily.     potassium chloride (KLOR-CON) 10 MEQ tablet TAKE 2 TABLETS BY MOUTH TWICE DAILY 360 tablet 3   Propylene Glycol-Glycerin 1-0.3 % SOLN Place 1 drop into both eyes 3 (three) times daily as needed (for dry eyes).     rivaroxaban (XARELTO) 20 MG TABS tablet Take 1 tablet (20 mg total) by mouth daily. 90 tablet 3   sertraline (ZOLOFT) 50 MG tablet Take 1.5 tablets (75 mg total) by mouth at bedtime. 135 tablet 3   nebivolol (BYSTOLIC) 10 MG tablet Take 0.5 tablets (5 mg total) by mouth daily. 45 tablet 3   No current facility-administered medications for this visit.     Review of Systems    She denies chest pain, palpitations, dyspnea, pnd, orthopnea, n, v, dizziness, syncope, edema, weight gain, or early satiety. All other systems reviewed and are otherwise negative except as noted above.   Physical Exam    VS:  BP 112/82   Pulse 65   Ht 5' 5.5" (1.664 m)   Wt 236 lb 9.6 oz (107.3 kg)   LMP 06/19/2013   SpO2 94%   BMI 38.77 kg/m   GEN: Well nourished, well developed, in no acute distress. HEENT: normal. Neck: Supple, no JVD, carotid bruits, or masses. Cardiac: RRR, no murmurs, rubs, or gallops. No clubbing, cyanosis, edema.  Radials/DP/PT 2+ and equal bilaterally.  Respiratory:  Respirations regular and unlabored, clear to auscultation bilaterally. GI: Soft, nontender, nondistended, BS + x 4. MS: no deformity or atrophy. Skin: warm and dry, no rash. Neuro:  Strength and sensation are  intact. Psych: Normal affect.  Accessory Clinical Findings    ECG personally reviewed by me today -sinus rhythm, 65 bpm, first-degree AV block- no acute changes.   Lab Results  Component Value Date   WBC 5.6 03/26/2022   HGB 11.3 03/26/2022   HCT 34.3 03/26/2022   MCV 95 03/26/2022   PLT 332 03/26/2022   Lab Results  Component Value Date   CREATININE 0.84 03/26/2022   BUN 8 03/26/2022  NA 136 03/26/2022   K 6.2 (H) 03/26/2022   CL 98 03/26/2022   CO2 23 03/26/2022   Lab Results  Component Value Date   ALT 23 03/26/2022   AST 18 03/26/2022   ALKPHOS 137 (H) 03/26/2022   BILITOT 0.5 03/26/2022   Lab Results  Component Value Date   CHOL 146 03/26/2022   HDL 48 03/26/2022   LDLCALC 82 03/26/2022   LDLDIRECT 84 03/26/2022   TRIG 86 03/26/2022   CHOLHDL 2.9 09/10/2021    Lab Results  Component Value Date   HGBA1C 5.1 03/26/2022    Assessment & Plan    1. Persistent atrial fibrillation: Flecainide was increased in 11/2021 to 100 mg bid.  S/p DCCV in 12/2021.  Follow-up ETT in 09/2022 showed no exercise induced arrhythmia though target heart rate was not achieved, considered nondiagnostic due to baseline EKG was sinus bradycardia, AVB, resting T wave inversions. Maintaining NSR. If she does have recurrent atrial fibrillation, would likely consider referral to A-fib clinic for further consideration of alternative antiarrhythmic therapy, possible ablation.  For now, continue flecainide, Bystolic, Xarelto.   2. Hypertension: BP well controlled. Continue current antihypertensive regimen.  She was recently notified that her insurance would no longer be covering Bystolic.  However, we did find a good Rx price of $15-$18 for 30-day supply.  She will let us know if she has difficulty obtaining her medication or if she requires prior authorization in the future.  3. Lower extremity edema: Echo in February 2023 showed normal LV function, severe left atrial enlargement, right atrial  enlargement, mild mitral valve regurgitation, mild to moderate tricuspid valve regurgitation. Euvolemic and well compensated on exam, no significant edema. Continue hydrochlorothiazide.   4. Obesity: Encouraged increased activity as tolerated.   5. Disposition: Follow-up in 6 months with Dr. Stanford Breed.     Lenna Sciara, NP 10/25/2022, 4:19 PM

## 2022-10-25 NOTE — Telephone Encounter (Signed)
Lmom to discuss stress test results. Waiting on a return call.  

## 2022-10-25 NOTE — Telephone Encounter (Signed)
Results discussed at today's appointment with Diona Browner NP.

## 2022-10-25 NOTE — Patient Instructions (Signed)
Medication Instructions:  Your physician recommends that you continue on your current medications as directed. Please refer to the Current Medication list given to you today.  *If you need a refill on your cardiac medications before your next appointment, please call your pharmacy*   Lab Work: NONE If you have labs (blood work) drawn today and your tests are completely normal, you will receive your results only by: [place] (if you have MyChart) OR A paper copy in the mail If you have any lab test that is abnormal or we need to change your treatment, we will call you to review the results.   Testing/Procedures: NONE   Follow-Up: At [facility], you and your health needs are our priority.  As part of our continuing mission to provide you with exceptional heart care, we have created designated Provider Care Teams.  These Care Teams include your primary Cardiologist (physician) and Advanced Practice Providers (APPs -  Physician Assistants and Nurse Practitioners) who all work together to provide you with the care you need, when you need it.  We recommend signing up for the patient portal called "MyChart".  Sign up information is provided on this After Visit Summary.  MyChart is used to connect with patients for Virtual Visits (Telemedicine).  Patients are able to view lab/test results, encounter notes, upcoming appointments, etc.  Non-urgent messages can be sent to your provider as well.   To learn more about what you can do with MyChart, go to [email or web address].    Your next appointment:   5-6 month(s) or NEXT AVAILABLE around this time frame.   Provider:   Kirk Ruths, MD

## 2022-10-28 ENCOUNTER — Other Ambulatory Visit: Payer: Self-pay

## 2022-10-28 ENCOUNTER — Other Ambulatory Visit (HOSPITAL_BASED_OUTPATIENT_CLINIC_OR_DEPARTMENT_OTHER): Payer: Self-pay

## 2022-10-28 MED ORDER — METHOTREXATE SODIUM 2.5 MG PO TABS
12.5000 mg | ORAL_TABLET | ORAL | 0 refills | Status: DC
  Filled 2022-10-28: qty 20, 28d supply, fill #0

## 2022-11-01 ENCOUNTER — Other Ambulatory Visit (HOSPITAL_BASED_OUTPATIENT_CLINIC_OR_DEPARTMENT_OTHER): Payer: Self-pay

## 2022-11-02 ENCOUNTER — Other Ambulatory Visit (HOSPITAL_BASED_OUTPATIENT_CLINIC_OR_DEPARTMENT_OTHER): Payer: Self-pay

## 2022-11-03 ENCOUNTER — Other Ambulatory Visit (HOSPITAL_BASED_OUTPATIENT_CLINIC_OR_DEPARTMENT_OTHER): Payer: Self-pay

## 2022-11-04 ENCOUNTER — Other Ambulatory Visit (HOSPITAL_BASED_OUTPATIENT_CLINIC_OR_DEPARTMENT_OTHER): Payer: Self-pay

## 2022-11-08 ENCOUNTER — Other Ambulatory Visit (HOSPITAL_BASED_OUTPATIENT_CLINIC_OR_DEPARTMENT_OTHER): Payer: Self-pay

## 2022-11-09 ENCOUNTER — Other Ambulatory Visit (HOSPITAL_BASED_OUTPATIENT_CLINIC_OR_DEPARTMENT_OTHER): Payer: Self-pay

## 2022-11-11 ENCOUNTER — Other Ambulatory Visit (HOSPITAL_BASED_OUTPATIENT_CLINIC_OR_DEPARTMENT_OTHER): Payer: Self-pay

## 2022-11-12 ENCOUNTER — Other Ambulatory Visit (HOSPITAL_BASED_OUTPATIENT_CLINIC_OR_DEPARTMENT_OTHER): Payer: Self-pay

## 2022-11-15 ENCOUNTER — Other Ambulatory Visit (HOSPITAL_BASED_OUTPATIENT_CLINIC_OR_DEPARTMENT_OTHER): Payer: Self-pay

## 2022-11-16 ENCOUNTER — Other Ambulatory Visit (HOSPITAL_BASED_OUTPATIENT_CLINIC_OR_DEPARTMENT_OTHER): Payer: Self-pay

## 2022-11-25 ENCOUNTER — Other Ambulatory Visit (HOSPITAL_BASED_OUTPATIENT_CLINIC_OR_DEPARTMENT_OTHER): Payer: Self-pay

## 2022-11-29 ENCOUNTER — Other Ambulatory Visit (HOSPITAL_BASED_OUTPATIENT_CLINIC_OR_DEPARTMENT_OTHER): Payer: Self-pay

## 2022-11-29 MED ORDER — METHOTREXATE SODIUM 2.5 MG PO TABS
12.5000 mg | ORAL_TABLET | ORAL | 0 refills | Status: DC
  Filled 2022-11-29: qty 20, 28d supply, fill #0

## 2022-11-30 ENCOUNTER — Other Ambulatory Visit (HOSPITAL_BASED_OUTPATIENT_CLINIC_OR_DEPARTMENT_OTHER): Payer: Self-pay

## 2022-11-30 MED ORDER — SKYRIZI PEN 150 MG/ML ~~LOC~~ SOAJ
SUBCUTANEOUS | 3 refills | Status: DC
  Filled 2023-04-14: qty 1, 84d supply, fill #0
  Filled 2023-07-19: qty 1, 84d supply, fill #1
  Filled 2023-10-17: qty 1, 84d supply, fill #2

## 2022-11-30 MED ORDER — SKYRIZI PEN 150 MG/ML ~~LOC~~ SOAJ
SUBCUTANEOUS | 0 refills | Status: AC
  Filled 2022-11-30 – 2023-01-06 (×3): qty 1, 28d supply, fill #0
  Filled 2023-01-26 – 2023-04-13 (×7): qty 1, 28d supply, fill #1

## 2022-12-02 ENCOUNTER — Other Ambulatory Visit (HOSPITAL_BASED_OUTPATIENT_CLINIC_OR_DEPARTMENT_OTHER): Payer: Self-pay

## 2022-12-03 ENCOUNTER — Other Ambulatory Visit (HOSPITAL_BASED_OUTPATIENT_CLINIC_OR_DEPARTMENT_OTHER): Payer: Self-pay

## 2022-12-14 ENCOUNTER — Other Ambulatory Visit (HOSPITAL_BASED_OUTPATIENT_CLINIC_OR_DEPARTMENT_OTHER): Payer: Self-pay

## 2022-12-17 ENCOUNTER — Other Ambulatory Visit: Payer: Self-pay

## 2022-12-17 ENCOUNTER — Other Ambulatory Visit (HOSPITAL_COMMUNITY): Payer: Self-pay

## 2022-12-22 ENCOUNTER — Other Ambulatory Visit (HOSPITAL_COMMUNITY): Payer: Self-pay

## 2022-12-22 ENCOUNTER — Other Ambulatory Visit (HOSPITAL_BASED_OUTPATIENT_CLINIC_OR_DEPARTMENT_OTHER): Payer: Self-pay

## 2022-12-22 ENCOUNTER — Encounter (HOSPITAL_COMMUNITY): Payer: Self-pay

## 2022-12-23 ENCOUNTER — Other Ambulatory Visit (HOSPITAL_COMMUNITY): Payer: Self-pay

## 2022-12-23 ENCOUNTER — Other Ambulatory Visit (HOSPITAL_BASED_OUTPATIENT_CLINIC_OR_DEPARTMENT_OTHER): Payer: Self-pay

## 2022-12-28 ENCOUNTER — Other Ambulatory Visit (HOSPITAL_COMMUNITY): Payer: Self-pay

## 2022-12-31 ENCOUNTER — Other Ambulatory Visit (HOSPITAL_COMMUNITY): Payer: Self-pay

## 2022-12-31 ENCOUNTER — Other Ambulatory Visit (HOSPITAL_BASED_OUTPATIENT_CLINIC_OR_DEPARTMENT_OTHER): Payer: Self-pay

## 2023-01-03 ENCOUNTER — Other Ambulatory Visit (HOSPITAL_BASED_OUTPATIENT_CLINIC_OR_DEPARTMENT_OTHER): Payer: Self-pay

## 2023-01-03 MED ORDER — METHOTREXATE SODIUM 2.5 MG PO TABS
12.5000 mg | ORAL_TABLET | ORAL | 1 refills | Status: DC
  Filled 2023-01-03 – 2023-02-15 (×2): qty 20, 28d supply, fill #0
  Filled 2023-03-24: qty 20, 28d supply, fill #1

## 2023-01-05 ENCOUNTER — Other Ambulatory Visit: Payer: Self-pay

## 2023-01-06 ENCOUNTER — Other Ambulatory Visit: Payer: Self-pay

## 2023-01-06 ENCOUNTER — Other Ambulatory Visit (HOSPITAL_COMMUNITY): Payer: Self-pay

## 2023-01-14 ENCOUNTER — Other Ambulatory Visit (HOSPITAL_COMMUNITY): Payer: Self-pay

## 2023-01-17 ENCOUNTER — Other Ambulatory Visit (HOSPITAL_BASED_OUTPATIENT_CLINIC_OR_DEPARTMENT_OTHER): Payer: Self-pay

## 2023-01-20 ENCOUNTER — Other Ambulatory Visit (HOSPITAL_BASED_OUTPATIENT_CLINIC_OR_DEPARTMENT_OTHER): Payer: Self-pay

## 2023-01-21 ENCOUNTER — Other Ambulatory Visit (HOSPITAL_BASED_OUTPATIENT_CLINIC_OR_DEPARTMENT_OTHER): Payer: Self-pay

## 2023-01-25 ENCOUNTER — Other Ambulatory Visit (HOSPITAL_COMMUNITY): Payer: Self-pay

## 2023-01-26 ENCOUNTER — Other Ambulatory Visit: Payer: Self-pay

## 2023-01-27 ENCOUNTER — Other Ambulatory Visit: Payer: Self-pay

## 2023-01-27 ENCOUNTER — Other Ambulatory Visit (HOSPITAL_BASED_OUTPATIENT_CLINIC_OR_DEPARTMENT_OTHER): Payer: Self-pay

## 2023-01-27 MED ORDER — CLOBETASOL PROPIONATE 0.05 % EX OINT
TOPICAL_OINTMENT | Freq: Every day | CUTANEOUS | 0 refills | Status: DC
  Filled 2023-01-27: qty 60, 30d supply, fill #0
  Filled 2023-02-07: qty 60, 28d supply, fill #0

## 2023-02-02 ENCOUNTER — Other Ambulatory Visit (HOSPITAL_COMMUNITY): Payer: Self-pay

## 2023-02-04 ENCOUNTER — Other Ambulatory Visit (HOSPITAL_COMMUNITY): Payer: Self-pay

## 2023-02-04 ENCOUNTER — Other Ambulatory Visit (HOSPITAL_BASED_OUTPATIENT_CLINIC_OR_DEPARTMENT_OTHER): Payer: Self-pay

## 2023-02-07 ENCOUNTER — Other Ambulatory Visit (HOSPITAL_COMMUNITY): Payer: Self-pay

## 2023-02-07 ENCOUNTER — Other Ambulatory Visit (HOSPITAL_BASED_OUTPATIENT_CLINIC_OR_DEPARTMENT_OTHER): Payer: Self-pay

## 2023-02-08 ENCOUNTER — Other Ambulatory Visit (HOSPITAL_COMMUNITY): Payer: Self-pay

## 2023-02-10 ENCOUNTER — Other Ambulatory Visit (HOSPITAL_COMMUNITY): Payer: Self-pay

## 2023-02-10 ENCOUNTER — Encounter (HOSPITAL_COMMUNITY): Payer: Self-pay

## 2023-02-15 ENCOUNTER — Other Ambulatory Visit (HOSPITAL_BASED_OUTPATIENT_CLINIC_OR_DEPARTMENT_OTHER): Payer: Self-pay

## 2023-02-16 ENCOUNTER — Other Ambulatory Visit (HOSPITAL_BASED_OUTPATIENT_CLINIC_OR_DEPARTMENT_OTHER): Payer: Self-pay

## 2023-02-18 ENCOUNTER — Other Ambulatory Visit: Payer: Self-pay

## 2023-02-23 ENCOUNTER — Other Ambulatory Visit (HOSPITAL_BASED_OUTPATIENT_CLINIC_OR_DEPARTMENT_OTHER): Payer: Self-pay

## 2023-03-16 ENCOUNTER — Emergency Department (HOSPITAL_COMMUNITY): Payer: Commercial Managed Care - HMO

## 2023-03-16 ENCOUNTER — Encounter (HOSPITAL_COMMUNITY): Payer: Self-pay

## 2023-03-16 ENCOUNTER — Emergency Department (HOSPITAL_COMMUNITY)
Admission: EM | Admit: 2023-03-16 | Discharge: 2023-03-16 | Disposition: A | Discharge status: Home / Self Care | Payer: Commercial Managed Care - HMO | Attending: Emergency Medicine | Admitting: Emergency Medicine

## 2023-03-16 ENCOUNTER — Other Ambulatory Visit: Payer: Self-pay

## 2023-03-16 DIAGNOSIS — S80211A Abrasion, right knee, initial encounter: Secondary | ICD-10-CM | POA: Diagnosis not present

## 2023-03-16 DIAGNOSIS — S92424A Nondisplaced fracture of distal phalanx of right great toe, initial encounter for closed fracture: Secondary | ICD-10-CM | POA: Diagnosis not present

## 2023-03-16 DIAGNOSIS — Y9301 Activity, walking, marching and hiking: Secondary | ICD-10-CM | POA: Diagnosis not present

## 2023-03-16 DIAGNOSIS — Z96641 Presence of right artificial hip joint: Secondary | ICD-10-CM | POA: Diagnosis not present

## 2023-03-16 DIAGNOSIS — S82032A Displaced transverse fracture of left patella, initial encounter for closed fracture: Secondary | ICD-10-CM | POA: Insufficient documentation

## 2023-03-16 DIAGNOSIS — S8992XA Unspecified injury of left lower leg, initial encounter: Secondary | ICD-10-CM | POA: Diagnosis present

## 2023-03-16 DIAGNOSIS — W010XXA Fall on same level from slipping, tripping and stumbling without subsequent striking against object, initial encounter: Secondary | ICD-10-CM | POA: Insufficient documentation

## 2023-03-16 DIAGNOSIS — S82033A Displaced transverse fracture of unspecified patella, initial encounter for closed fracture: Secondary | ICD-10-CM

## 2023-03-16 DIAGNOSIS — Z7901 Long term (current) use of anticoagulants: Secondary | ICD-10-CM | POA: Insufficient documentation

## 2023-03-16 MED ORDER — IBUPROFEN 800 MG PO TABS
800.0000 mg | ORAL_TABLET | Freq: Once | ORAL | Status: DC
  Filled 2023-03-16: qty 1

## 2023-03-16 MED ORDER — OXYCODONE HCL ER 10 MG PO T12A
10.0000 mg | EXTENDED_RELEASE_TABLET | Freq: Once | ORAL | Status: AC
  Administered 2023-03-16: 10 mg via ORAL
  Filled 2023-03-16: qty 1

## 2023-03-16 MED ORDER — ACETAMINOPHEN 500 MG PO TABS
1000.0000 mg | ORAL_TABLET | Freq: Once | ORAL | Status: AC
  Administered 2023-03-16: 1000 mg via ORAL
  Filled 2023-03-16: qty 2

## 2023-03-16 MED ORDER — OXYCODONE-ACETAMINOPHEN 5-325 MG PO TABS
1.0000 | ORAL_TABLET | Freq: Four times a day (QID) | ORAL | 0 refills | Status: DC | PRN
  Filled 2023-03-16: qty 8, 2d supply, fill #0

## 2023-03-16 NOTE — ED Provider Notes (Signed)
[place] EMERGENCY DEPARTMENT AT [facility] Provider Note   CSN: [id number] Arrival date & time: 03/16/23  1436     History  No chief complaint on file.  HPI Monica [name] is a 67 y.o. female with A-fib on Xarelto, diabetes and history of right hip replacement presenting for fall.  Occurred earlier this afternoon.  Patient was walking to the parking lot when someone opened a car door in front of her she tripped and fell and landed on both her knees.  Denies head injury or loss of consciousness.  Now has pain in both knees. Left knee is more painful and swollen.  Having difficulty ambulating or bearing weight.  Normally walks without assistance. Also endorsing pain in the right great toe. Denies hip pain.  HPI     Home Medications Prior to Admission medications   Medication Sig Start Date End Date Taking? Authorizing Provider  oxyCODONE-acetaminophen (PERCOCET/ROXICET) 5-325 MG tablet Take 1 tablet by mouth every 6 (six) hours as needed for severe pain. 03/16/23  Yes Gareth Eagle, PA-C  acetaminophen (TYLENOL) 650 MG CR tablet Take 650-1,300 mg by mouth every 8 (eight) hours as needed for pain.    [provider]  Blood Pressure Monitor MISC Use to check blood pressure once daily 05/08/21   Alver Sorrow, NP  calcium carbonate (TUMS EX) 750 MG chewable tablet Chew 1-3 tablets by mouth 3 (three) times daily as needed for heartburn.    [provider]  Cholecalciferol (VITAMIN D3) 2000 units TABS Take 2,000 Units by mouth every evening.    [provider]  clobetasol ointment (TEMOVATE) 0.05 % Apply 1 application on the skin DAILY Apply daily to thick areas 08/13/21     clobetasol ointment (TEMOVATE) 0.05 % Apply 1 application on the affected areas daily. 01/27/23     desonide (DESOWEN) 0.05 % cream Apply 1 application topically 2 (two) times daily as needed (for psoriasis).  11/04/17   [provider]  diclofenac sodium (VOLTAREN) 1 %  GEL Apply 4 g topically 4 (four) times daily. Patient taking differently: Apply 4 g topically 4 (four) times daily as needed (pain). 07/01/16   Hudnall, Azucena Fallen, MD  Dulaglutide (TRULICITY) 3 MG/0.5ML SOPN Inject 0.5 mLs (3 mg total) into the skin every 7 days. 08/10/22   Sharlene Dory, DO  flecainide (TAMBOCOR) 100 MG tablet Take 1 tablet (100 mg total) by mouth 2 (two) times daily. 03/25/22 04/21/23  Tollie Eth, NP  fluticasone (FLONASE) 50 MCG/ACT nasal spray PLACE 2 SPRAYS IN EACH NOSTRIL EVERY DAY 03/25/22   Early, Sung Amabile, NP  folic acid (FOLVITE) 1 MG tablet TAKE 2 TABLETS BY MOUTH DAILY 01/15/22     hydrochlorothiazide (HYDRODIURIL) 25 MG tablet Take 1 tablet (25 mg total) by mouth every morning. 08/10/22 08/10/23  Sharlene Dory, DO  methotrexate (RHEUMATREX) 2.5 MG tablet Take 6 tablet by mouth once a week Patient taking differently: Take 2.5 mg by mouth once a week. Pt. Takes 5 tablets once a week. 06/19/21     methotrexate (RHEUMATREX) 2.5 MG tablet Take 5 tablets (12.5 mg total) by mouth once a week. 10/28/22     methotrexate (RHEUMATREX) 2.5 MG tablet Take 5 tablets (12.5 mg total) by mouth once a week. 01/03/23     Multiple Vitamin (MULTIVITAMIN WITH MINERALS) TABS tablet Take 1 tablet by mouth daily.    [provider]  nebivolol (BYSTOLIC) 10 MG tablet Take 0.5 tablets (5 mg  total) by mouth daily. 10/25/22   Joylene Grapes, NP  potassium chloride (KLOR-CON) 10 MEQ tablet TAKE 2 TABLETS BY MOUTH TWICE DAILY 02/11/21     Propylene Glycol-Glycerin 1-0.3 % SOLN Place 1 drop into both eyes 3 (three) times daily as needed (for dry eyes).    [provider]  risankizumab-rzaa (SKYRIZI PEN) 150 MG/ML pen Inject 1 pen injector subcutaneously every four weeks Inject at week 0 and week 4, LOADING DOSE. 11/30/22     Risankizumab-rzaa (SKYRIZI PEN) 150 MG/ML SOAJ Inject 1 pen injector subcutaneously every three months Start on week 16, MAINTENANCE DOSE. 11/30/22      rivaroxaban (XARELTO) 20 MG TABS tablet Take 1 tablet (20 mg total) by mouth daily. 09/21/22 09/21/23  Sharlene Dory, DO  sertraline (ZOLOFT) 50 MG tablet Take 1.5 tablets (75 mg total) by mouth at bedtime. 08/10/22   Sharlene Dory, DO      Allergies    Patient has no known allergies.    Review of Systems   See HPI for pertinent positives  Physical Exam Updated Vital Signs BP 135/77   Pulse 77   Temp 98.6 F (37 C)   Resp 16   Ht 5' 5.5" (1.664 m)   Wt 107.3 kg   LMP 06/19/2013   SpO2 98%   BMI 38.77 kg/m  Physical Exam Constitutional:      Appearance: Normal appearance.  HENT:     Head: Normocephalic.     Nose: Nose normal.  Eyes:     Conjunctiva/sclera: Conjunctivae normal.  Cardiovascular:     Rate and Rhythm: Normal rate and regular rhythm.     Pulses: Normal pulses.          Dorsalis pedis pulses are 2+ on the right side and 2+ on the left side.     Heart sounds: Normal heart sounds.  Pulmonary:     Effort: Pulmonary effort is normal.     Breath sounds: Normal breath sounds.  Musculoskeletal:     Comments: Right knee: Appears grossly normal, small superficial abrasion.  No swelling or erythema.  Not tender to touch.  Range of motion of the knee appears normal as well.  Pedal pulses are 2+ bilaterally.    Left knee: Appears swollen, painful to touch.  Pain primarily about the anterior aspect of the knee joint.  Range of motion is limited due to pain but able to flex and extend the knee.  Right great toe appears edematous and is tender to touch.  Not able to bear weight on the left leg.  Neurological:     Mental Status: She is alert.  Psychiatric:        Mood and Affect: Mood normal.     ED Results / Procedures / Treatments   Labs (all labs ordered are listed, but only abnormal results are displayed) Labs Reviewed - No data to display  EKG None  Radiology DG Foot Complete Right  Result Date: 03/16/2023 CLINICAL DATA:  Fall with  great toe pain EXAM: RIGHT FOOT COMPLETE - 3+ VIEW COMPARISON:  None Available. FINDINGS: Acute nondisplaced intra-articular fracture base of first distal phalanx both on medial and lateral side. No subluxation. Moderate degenerative change at the first MTP joint. Small plantar calcaneal spur IMPRESSION: Acute nondisplaced intra-articular fracture base of first distal phalanx. Electronically Signed   By: Jasmine Pang M.D.   On: 03/16/2023 17:47   DG Knee Complete 4 Views Right  Result Date: 03/16/2023 CLINICAL DATA:  Fall.  Pain. EXAM: RIGHT KNEE - COMPLETE 4+ VIEW COMPARISON:  Right knee radiographs 09/16/2017 FINDINGS: Moderate medial coronoid joint space narrowing and peripheral osteophytosis. Minimal peripheral lateral chronic degenerative osteophytosis. Mild chronic enthesopathic change at the quadriceps and patellar insertions on the patella. There is increased elongation of the superior patellar pole likely from chronic traction changes/osteophytosis. There appears to be a chronic separation/fragmentation of the superior patellar osteophyte measuring up to 1.5 cm. No knee joint effusion.  No acute fracture or dislocation. IMPRESSION: 1. No acute fracture or dislocation. 2. Moderate medial compartment osteoarthritis. 3. Chronic traction changes/fragmented osteophytosis of the superior patellar pole. Electronically Signed   By: Neita Garnet M.D.   On: 03/16/2023 16:05   DG Knee Complete 4 Views Left  Result Date: 03/16/2023 CLINICAL DATA:  Fall.  Left knee pain. EXAM: LEFT KNEE - COMPLETE 4+ VIEW COMPARISON:  None available FINDINGS: There is a transverse fracture of the inferior aspect of the patella. There is a 2.0 x 1.1 cm (craniocaudal by AP) bone fragment anterior to the distal femur, in the expected region of the inferior aspect of the patella. The patellar tendon appears to attach to this bone. There is approximately 1.7 cm superior migration of the dominant more superior aspect of the patella.  Mild chronic enthesopathic change at the quadriceps insertion on the patella. Small joint effusion. Mild-to-moderate medial coronoid joint space narrowing and peripheral osteophytosis. There are multiple small cortical fracture fragments overlying the patellar fracture. IMPRESSION: 1. Transverse fracture of the inferior aspect of the patella. There is approximately 1.7 cm superior migration of the dominant more superior aspect of the patella. 2. Small joint effusion. 3. Mild-to-moderate medial compartment osteoarthritis. Electronically Signed   By: Neita Garnet M.D.   On: 03/16/2023 16:01    Procedures Procedures    Medications Ordered in ED Medications  acetaminophen (TYLENOL) tablet 1,000 mg (1,000 mg Oral Given 03/16/23 1451)  oxyCODONE (OXYCONTIN) 12 hr tablet 10 mg (10 mg Oral Given 03/16/23 1949)    ED Course/ Medical Decision Making/ A&P                                 Medical Decision Making Amount and/or Complexity of Data Reviewed Radiology: ordered.  Risk Prescription drug management.   67 year old well-appearing female presenting for fall.  Exam notable for swelling and limited range of motion in the left knee, tenderness and swelling in the right great toe.  X-rays reveal transverse patella fracture in the left knee and an intra-articular fracture nondisplaced in the great toe of the right foot.  Placed left knee immobilizer and buddy tape right toes.  Treated pain with oxycodone and Tylenol.  On reassessment patient felt better.  Able to ambulate to and from the bathroom. Requested ortho follow up with Dr. Lequita Halt.  Vital stable.  Discharged home.        Final Clinical Impression(s) / ED Diagnoses Final diagnoses:  Closed nondisplaced fracture of distal phalanx of right great toe, initial encounter  Closed displaced transverse fracture of patella, unspecified laterality, initial encounter    Rx / DC Orders ED Discharge Orders          Ordered     oxyCODONE-acetaminophen (PERCOCET/ROXICET) 5-325 MG tablet  Every 6 hours PRN        03/16/23 2040              Gareth Eagle, PA-C 03/16/23 2043  Jacalyn Lefevre, MD 03/16/23 2256

## 2023-03-16 NOTE — Progress Notes (Signed)
Orthopedic Tech Progress Note Patient Details:  Monica [name] [birth year] [id number]  Ortho Devices Type of Ortho Device: Knee Immobilizer Ortho Device/Splint Location: LLE Ortho Device/Splint Interventions: Ordered, Application, Adjustment   Post Interventions Patient Tolerated: Well Instructions Provided: Care of device, Adjustment of device  Sherilyn Banker 03/16/2023, 8:01 PM

## 2023-03-16 NOTE — ED Notes (Signed)
Pt ambulated to bathroom with walker.

## 2023-03-16 NOTE — Discharge Instructions (Addendum)
Evaluation today revealed you have a fracture of your patella in the left knee.  And a fracture in the right great toe.  Recommend the use the walker to ambulate.  Please keep the knee immobilized until you are evaluated by Ortho.  Sent Norco to your pharmacy.
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
## 2023-03-17 ENCOUNTER — Other Ambulatory Visit (HOSPITAL_BASED_OUTPATIENT_CLINIC_OR_DEPARTMENT_OTHER): Payer: Self-pay

## 2023-03-22 ENCOUNTER — Encounter: Payer: Self-pay | Admitting: Family Medicine

## 2023-03-23 ENCOUNTER — Other Ambulatory Visit (HOSPITAL_BASED_OUTPATIENT_CLINIC_OR_DEPARTMENT_OTHER): Payer: Self-pay

## 2023-03-23 MED ORDER — OXYCODONE HCL 5 MG PO TABS
5.0000 mg | ORAL_TABLET | Freq: Two times a day (BID) | ORAL | 0 refills | Status: DC | PRN
  Filled 2023-03-23: qty 20, 10d supply, fill #0

## 2023-03-24 ENCOUNTER — Other Ambulatory Visit (HOSPITAL_BASED_OUTPATIENT_CLINIC_OR_DEPARTMENT_OTHER): Payer: Self-pay
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
## 2023-03-25 ENCOUNTER — Encounter (HOSPITAL_COMMUNITY): Payer: Self-pay | Admitting: Orthopedic Surgery

## 2023-03-25 ENCOUNTER — Other Ambulatory Visit: Payer: Self-pay

## 2023-03-25 NOTE — Progress Notes (Addendum)
PCP - Ave Filter , DO Cardiologist -  Dr. Paulene Floor, NP 10-25-22 epic  PPM/ICD -  Device Orders -  Rep Notified -   Chest x-ray -  EKG - 10-25-22 Stress Test - ETT 10-20-22 epic ECHO - 09-18-21 epic Cardiac Cath -   Sleep Study -  CPAP -   Fasting Blood Sugar -  Checks Blood Sugar _____ times a day  Blood Thinner Instructions:  Xarelto last dose 03-25-23 Aspirin Instructions:  ERAS Protcol - PRE-SURGERY    COVID vaccine -yes  Activity--Able to climb steps in home before knee injury without SOB or CP Anesthesia review: A-fib, mild pulm. HTN, OSA  uses mouth guard. Pre-DM  ON Trulicity  last dose- 03-17-23  Patient denies shortness of breath, fever, cough and chest pain at PAT appointment   All instructions explained to the patient, with a verbal understanding of the material. Patient agrees to go over the instructions while at home for a better understanding. Patient also instructed to self quarantine after being tested for COVID-19. The opportunity to ask questions was provided.

## 2023-03-25 NOTE — Progress Notes (Signed)
Second attempt to do preop phone call LVM.

## 2023-03-25 NOTE — Progress Notes (Signed)
LVM for pt. To call regarding preop call for surgery.

## 2023-03-25 NOTE — Progress Notes (Addendum)
Instructions for surgery left on pt. VM.pt. returned call preop complete by phone. Pt. VU

## 2023-03-28 ENCOUNTER — Ambulatory Visit (HOSPITAL_BASED_OUTPATIENT_CLINIC_OR_DEPARTMENT_OTHER): Payer: Commercial Managed Care - HMO | Admitting: Registered Nurse

## 2023-03-28 ENCOUNTER — Other Ambulatory Visit: Payer: Self-pay

## 2023-03-28 ENCOUNTER — Observation Stay (HOSPITAL_COMMUNITY)
Admission: RE | Admit: 2023-03-28 | Discharge: 2023-03-29 | Disposition: A | Discharge status: Home / Self Care | Payer: Commercial Managed Care - HMO | Attending: Orthopedic Surgery | Admitting: Orthopedic Surgery

## 2023-03-28 ENCOUNTER — Ambulatory Visit (HOSPITAL_COMMUNITY): Payer: Commercial Managed Care - HMO | Admitting: Registered Nurse

## 2023-03-28 ENCOUNTER — Encounter (HOSPITAL_COMMUNITY)
Admission: RE | Disposition: A | Discharge status: Home / Self Care | Payer: Self-pay | Source: Home / Self Care | Attending: Orthopedic Surgery

## 2023-03-28 ENCOUNTER — Encounter (HOSPITAL_COMMUNITY): Payer: Self-pay | Admitting: Orthopedic Surgery

## 2023-03-28 DIAGNOSIS — Z79899 Other long term (current) drug therapy: Secondary | ICD-10-CM | POA: Insufficient documentation

## 2023-03-28 DIAGNOSIS — Z7901 Long term (current) use of anticoagulants: Secondary | ICD-10-CM | POA: Diagnosis not present

## 2023-03-28 DIAGNOSIS — S82002A Unspecified fracture of left patella, initial encounter for closed fracture: Secondary | ICD-10-CM

## 2023-03-28 DIAGNOSIS — W010XXA Fall on same level from slipping, tripping and stumbling without subsequent striking against object, initial encounter: Secondary | ICD-10-CM | POA: Diagnosis not present

## 2023-03-28 DIAGNOSIS — I152 Hypertension secondary to endocrine disorders: Principal | ICD-10-CM

## 2023-03-28 DIAGNOSIS — I4891 Unspecified atrial fibrillation: Secondary | ICD-10-CM | POA: Insufficient documentation

## 2023-03-28 DIAGNOSIS — Z96643 Presence of artificial hip joint, bilateral: Secondary | ICD-10-CM | POA: Diagnosis not present

## 2023-03-28 DIAGNOSIS — I10 Essential (primary) hypertension: Secondary | ICD-10-CM | POA: Diagnosis not present

## 2023-03-28 DIAGNOSIS — E1159 Type 2 diabetes mellitus with other circulatory complications: Principal | ICD-10-CM

## 2023-03-28 HISTORY — PX: ORIF PATELLA: SHX5033

## 2023-03-28 HISTORY — DX: Anxiety disorder, unspecified: F41.9

## 2023-03-28 LAB — CBC
HCT: 33 % — ABNORMAL LOW (ref 36.0–46.0)
Hemoglobin: 10.6 g/dL — ABNORMAL LOW (ref 12.0–15.0)
MCH: 29.9 pg (ref 26.0–34.0)
MCHC: 32.1 g/dL (ref 30.0–36.0)
MCV: 93.2 fL (ref 80.0–100.0)
Platelets: 299 10*3/uL (ref 150–400)
RBC: 3.54 MIL/uL — ABNORMAL LOW (ref 3.87–5.11)
RDW: 14.1 % (ref 11.5–15.5)
WBC: 5.1 10*3/uL (ref 4.0–10.5)
nRBC: 0 % (ref 0.0–0.2)

## 2023-03-28 LAB — BASIC METABOLIC PANEL
Anion gap: 8 (ref 5–15)
BUN: 13 mg/dL (ref 8–23)
CO2: 26 mmol/L (ref 22–32)
Calcium: 8.9 mg/dL (ref 8.9–10.3)
Chloride: 105 mmol/L (ref 98–111)
Creatinine, Ser: 0.84 mg/dL (ref 0.44–1.00)
GFR, Estimated: >60 mL/min (ref >60)
Glucose, Bld: 90 mg/dL (ref 70–99)
Potassium: 3.8 mmol/L (ref 3.5–5.1)
Sodium: 139 mmol/L (ref 135–145)

## 2023-03-28 LAB — GLUCOSE, CAPILLARY
Glucose-Capillary: 93 mg/dL (ref 70–99)
Glucose-Capillary: 73 mg/dL (ref 70–99)
Glucose-Capillary: 107 mg/dL — ABNORMAL HIGH (ref 70–99)

## 2023-03-28 SURGERY — OPEN REDUCTION INTERNAL FIXATION (ORIF) PATELLA
Anesthesia: General | Site: Knee | Laterality: Left

## 2023-03-28 MED ORDER — ACETAMINOPHEN 10 MG/ML IV SOLN
1000.0000 mg | Freq: Four times a day (QID) | INTRAVENOUS | Status: DC
  Administered 2023-03-28: 1000 mg via INTRAVENOUS
  Filled 2023-03-28: qty 100

## 2023-03-28 MED ORDER — ONDANSETRON HCL 4 MG PO TABS: 4.0000 mg | ORAL_TABLET | Freq: Four times a day (QID) | ORAL | Status: DC | PRN

## 2023-03-28 MED ORDER — SODIUM CHLORIDE 0.9 % IV SOLN: INTRAVENOUS | Status: DC

## 2023-03-28 MED ORDER — FENTANYL CITRATE (PF) 100 MCG/2ML IJ SOLN
INTRAMUSCULAR | Status: AC
  Filled 2023-03-28: qty 2

## 2023-03-28 MED ORDER — ORAL CARE MOUTH RINSE: 15.0000 mL | Freq: Once | OROMUCOSAL | Status: AC

## 2023-03-28 MED ORDER — ONDANSETRON HCL 4 MG/2ML IJ SOLN
INTRAMUSCULAR | Status: DC | PRN
  Administered 2023-03-28: 4 mg via INTRAVENOUS

## 2023-03-28 MED ORDER — ONDANSETRON HCL 4 MG/2ML IJ SOLN
INTRAMUSCULAR | Status: AC
  Filled 2023-03-28: qty 2

## 2023-03-28 MED ORDER — MORPHINE SULFATE (PF) 2 MG/ML IV SOLN: 1.0000 mg | INTRAVENOUS | Status: DC | PRN

## 2023-03-28 MED ORDER — DEXAMETHASONE SODIUM PHOSPHATE 10 MG/ML IJ SOLN
INTRAMUSCULAR | Status: AC
  Filled 2023-03-28: qty 1

## 2023-03-28 MED ORDER — BISACODYL 10 MG RE SUPP: 10.0000 mg | Freq: Every day | RECTAL | Status: DC | PRN

## 2023-03-28 MED ORDER — FENTANYL CITRATE PF 50 MCG/ML IJ SOSY
PREFILLED_SYRINGE | INTRAMUSCULAR | Status: AC
  Administered 2023-03-28: 50 ug via INTRAVENOUS
  Filled 2023-03-28: qty 1

## 2023-03-28 MED ORDER — FLUTICASONE PROPIONATE 50 MCG/ACT NA SUSP
2.0000 | Freq: Every day | NASAL | Status: DC
  Filled 2023-03-28: qty 16

## 2023-03-28 MED ORDER — BUPIVACAINE-EPINEPHRINE 0.25% -1:200000 IJ SOLN: INTRAMUSCULAR | Status: DC | PRN

## 2023-03-28 MED ORDER — PROPOFOL 10 MG/ML IV BOLUS
INTRAVENOUS | Status: AC
  Filled 2023-03-28: qty 20

## 2023-03-28 MED ORDER — ACETAMINOPHEN 10 MG/ML IV SOLN: 1000.0000 mg | Freq: Once | INTRAVENOUS | Status: DC | PRN

## 2023-03-28 MED ORDER — EPINEPHRINE PF 1 MG/ML IJ SOLN
INTRAMUSCULAR | Status: AC
  Filled 2023-03-28: qty 1

## 2023-03-28 MED ORDER — DULAGLUTIDE 3 MG/0.5ML ~~LOC~~ SOAJ: 3.0000 mg | Freq: Once | SUBCUTANEOUS | Status: DC

## 2023-03-28 MED ORDER — DIPHENHYDRAMINE HCL 12.5 MG/5ML PO ELIX: 12.5000 mg | ORAL_SOLUTION | ORAL | Status: DC | PRN

## 2023-03-28 MED ORDER — ORAL CARE MOUTH RINSE: 15.0000 mL | OROMUCOSAL | Status: DC | PRN

## 2023-03-28 MED ORDER — PHENOL 1.4 % MT LIQD: 1.0000 | OROMUCOSAL | Status: DC | PRN

## 2023-03-28 MED ORDER — INSULIN ASPART 100 UNIT/ML IJ SOLN: 0.0000 [IU] | Freq: Three times a day (TID) | INTRAMUSCULAR | Status: DC

## 2023-03-28 MED ORDER — MENTHOL 3 MG MT LOZG: 1.0000 | LOZENGE | OROMUCOSAL | Status: DC | PRN

## 2023-03-28 MED ORDER — EPHEDRINE 5 MG/ML INJ
INTRAVENOUS | Status: AC
  Filled 2023-03-28: qty 5

## 2023-03-28 MED ORDER — METOCLOPRAMIDE HCL 5 MG PO TABS: 5.0000 mg | ORAL_TABLET | Freq: Three times a day (TID) | ORAL | Status: DC | PRN

## 2023-03-28 MED ORDER — CLOBETASOL PROPIONATE 0.05 % EX OINT
TOPICAL_OINTMENT | Freq: Every day | CUTANEOUS | Status: DC
  Filled 2023-03-28: qty 15

## 2023-03-28 MED ORDER — NEBIVOLOL HCL 5 MG PO TABS
5.0000 mg | ORAL_TABLET | Freq: Every day | ORAL | Status: DC
  Administered 2023-03-29: 5 mg via ORAL
  Filled 2023-03-28: qty 1

## 2023-03-28 MED ORDER — STERILE WATER FOR IRRIGATION IR SOLN
Status: DC | PRN
  Administered 2023-03-28: 2000 mL

## 2023-03-28 MED ORDER — TRAMADOL HCL 50 MG PO TABS: 50.0000 mg | ORAL_TABLET | Freq: Four times a day (QID) | ORAL | Status: DC | PRN

## 2023-03-28 MED ORDER — MIDAZOLAM HCL 2 MG/2ML IJ SOLN
0.5000 mg | INTRAMUSCULAR | Status: AC
  Administered 2023-03-28: 1 mg via INTRAVENOUS
  Filled 2023-03-28: qty 2

## 2023-03-28 MED ORDER — RIVAROXABAN 10 MG PO TABS
10.0000 mg | ORAL_TABLET | Freq: Every day | ORAL | Status: DC
  Administered 2023-03-29: 10 mg via ORAL
  Filled 2023-03-28: qty 1

## 2023-03-28 MED ORDER — FENTANYL CITRATE PF 50 MCG/ML IJ SOSY
25.0000 ug | PREFILLED_SYRINGE | INTRAMUSCULAR | Status: DC | PRN
  Administered 2023-03-28: 50 ug via INTRAVENOUS

## 2023-03-28 MED ORDER — PHENYLEPHRINE 80 MCG/ML (10ML) SYRINGE FOR IV PUSH (FOR BLOOD PRESSURE SUPPORT)
PREFILLED_SYRINGE | INTRAVENOUS | Status: DC | PRN
  Administered 2023-03-28 (×3): 120 ug via INTRAVENOUS

## 2023-03-28 MED ORDER — CEFAZOLIN SODIUM-DEXTROSE 2-4 GM/100ML-% IV SOLN
2.0000 g | INTRAVENOUS | Status: AC
  Administered 2023-03-28: 2 g via INTRAVENOUS
  Filled 2023-03-28: qty 100

## 2023-03-28 MED ORDER — OXYCODONE HCL 5 MG PO TABS: 5.0000 mg | ORAL_TABLET | Freq: Once | ORAL | Status: DC | PRN

## 2023-03-28 MED ORDER — BUPIVACAINE HCL (PF) 0.25 % IJ SOLN
INTRAMUSCULAR | Status: AC
  Filled 2023-03-28: qty 30

## 2023-03-28 MED ORDER — POLYETHYLENE GLYCOL 3350 17 G PO PACK: 17.0000 g | PACK | Freq: Every day | ORAL | Status: DC | PRN

## 2023-03-28 MED ORDER — CHLORHEXIDINE GLUCONATE 0.12 % MT SOLN
15.0000 mL | Freq: Once | OROMUCOSAL | Status: AC
  Administered 2023-03-28: 15 mL via OROMUCOSAL

## 2023-03-28 MED ORDER — LIDOCAINE HCL (PF) 2 % IJ SOLN
INTRAMUSCULAR | Status: AC
  Filled 2023-03-28: qty 5

## 2023-03-28 MED ORDER — HYDROCHLOROTHIAZIDE 25 MG PO TABS
25.0000 mg | ORAL_TABLET | Freq: Every morning | ORAL | Status: DC
  Administered 2023-03-29: 25 mg via ORAL
  Filled 2023-03-28: qty 1

## 2023-03-28 MED ORDER — OXYCODONE HCL 5 MG PO TABS
10.0000 mg | ORAL_TABLET | ORAL | Status: DC | PRN
  Administered 2023-03-28 – 2023-03-29 (×4): 10 mg via ORAL
  Filled 2023-03-28: qty 2

## 2023-03-28 MED ORDER — FLEET ENEMA RE ENEM: 1.0000 | ENEMA | Freq: Once | RECTAL | Status: DC | PRN

## 2023-03-28 MED ORDER — BUPIVACAINE LIPOSOME 1.3 % IJ SUSP
INTRAMUSCULAR | Status: DC | PRN
  Administered 2023-03-28: 10 mL

## 2023-03-28 MED ORDER — FENTANYL CITRATE (PF) 100 MCG/2ML IJ SOLN
INTRAMUSCULAR | Status: DC | PRN
  Administered 2023-03-28: 50 ug via INTRAVENOUS
  Administered 2023-03-28 (×2): 25 ug via INTRAVENOUS

## 2023-03-28 MED ORDER — METHOCARBAMOL 1000 MG/10ML IJ SOLN: 500.0000 mg | Freq: Four times a day (QID) | INTRAVENOUS | Status: DC | PRN

## 2023-03-28 MED ORDER — HYDROCORTISONE 1 % EX CREA
TOPICAL_CREAM | Freq: Two times a day (BID) | CUTANEOUS | Status: DC
  Filled 2023-03-28: qty 28

## 2023-03-28 MED ORDER — DEXAMETHASONE SODIUM PHOSPHATE 4 MG/ML IJ SOLN
INTRAMUSCULAR | Status: DC | PRN
  Administered 2023-03-28: 5 mg via INTRAVENOUS

## 2023-03-28 MED ORDER — OXYCODONE HCL 5 MG PO TABS
5.0000 mg | ORAL_TABLET | ORAL | Status: DC | PRN
  Administered 2023-03-28: 10 mg via ORAL
  Filled 2023-03-28 (×4): qty 2

## 2023-03-28 MED ORDER — METHOCARBAMOL 500 MG PO TABS: 500.0000 mg | ORAL_TABLET | Freq: Four times a day (QID) | ORAL | Status: DC | PRN

## 2023-03-28 MED ORDER — CEFAZOLIN SODIUM-DEXTROSE 2-4 GM/100ML-% IV SOLN
2.0000 g | Freq: Four times a day (QID) | INTRAVENOUS | Status: AC
  Administered 2023-03-28 – 2023-03-29 (×2): 2 g via INTRAVENOUS
  Filled 2023-03-28 (×2): qty 100

## 2023-03-28 MED ORDER — ONDANSETRON HCL 4 MG/2ML IJ SOLN: 4.0000 mg | Freq: Once | INTRAMUSCULAR | Status: DC | PRN

## 2023-03-28 MED ORDER — PROPOFOL 10 MG/ML IV BOLUS
INTRAVENOUS | Status: DC | PRN
Start: 2023-03-28 — End: 2023-03-28
  Administered 2023-03-28: 150 mg via INTRAVENOUS

## 2023-03-28 MED ORDER — DOCUSATE SODIUM 100 MG PO CAPS
100.0000 mg | ORAL_CAPSULE | Freq: Two times a day (BID) | ORAL | Status: DC
  Administered 2023-03-28 – 2023-03-29 (×2): 100 mg via ORAL
  Filled 2023-03-28 (×2): qty 1

## 2023-03-28 MED ORDER — 0.9 % SODIUM CHLORIDE (POUR BTL) OPTIME
TOPICAL | Status: DC | PRN
  Administered 2023-03-28: 1000 mL

## 2023-03-28 MED ORDER — SERTRALINE HCL 50 MG PO TABS
75.0000 mg | ORAL_TABLET | Freq: Every day | ORAL | Status: DC
  Administered 2023-03-28: 75 mg via ORAL
  Filled 2023-03-28: qty 1

## 2023-03-28 MED ORDER — FLECAINIDE ACETATE 100 MG PO TABS
100.0000 mg | ORAL_TABLET | Freq: Two times a day (BID) | ORAL | Status: DC
  Administered 2023-03-28 – 2023-03-29 (×2): 100 mg via ORAL
  Filled 2023-03-28 (×2): qty 1

## 2023-03-28 MED ORDER — ONDANSETRON HCL 4 MG/2ML IJ SOLN: 4.0000 mg | Freq: Four times a day (QID) | INTRAMUSCULAR | Status: DC | PRN

## 2023-03-28 MED ORDER — LACTATED RINGERS IV SOLN: INTRAVENOUS | Status: DC

## 2023-03-28 MED ORDER — ACETAMINOPHEN 500 MG PO TABS
1000.0000 mg | ORAL_TABLET | Freq: Four times a day (QID) | ORAL | Status: DC
  Administered 2023-03-29 (×2): 1000 mg via ORAL
  Filled 2023-03-28 (×3): qty 2

## 2023-03-28 MED ORDER — FENTANYL CITRATE PF 50 MCG/ML IJ SOSY
25.0000 ug | PREFILLED_SYRINGE | INTRAMUSCULAR | Status: AC
  Administered 2023-03-28: 50 ug via INTRAVENOUS
  Filled 2023-03-28: qty 2

## 2023-03-28 MED ORDER — POLYVINYL ALCOHOL 1.4 % OP SOLN: 1.0000 [drp] | Freq: Three times a day (TID) | OPHTHALMIC | Status: DC | PRN

## 2023-03-28 MED ORDER — METOCLOPRAMIDE HCL 5 MG/ML IJ SOLN: 5.0000 mg | Freq: Three times a day (TID) | INTRAMUSCULAR | Status: DC | PRN

## 2023-03-28 MED ORDER — DEXAMETHASONE SODIUM PHOSPHATE 10 MG/ML IJ SOLN: 8.0000 mg | Freq: Once | INTRAMUSCULAR | Status: DC

## 2023-03-28 MED ORDER — MIDAZOLAM HCL 2 MG/2ML IJ SOLN
INTRAMUSCULAR | Status: AC
  Filled 2023-03-28: qty 2

## 2023-03-28 MED ORDER — BUPIVACAINE HCL (PF) 0.5 % IJ SOLN
INTRAMUSCULAR | Status: DC | PRN
  Administered 2023-03-28: 10 mL

## 2023-03-28 MED ORDER — MIDAZOLAM HCL 2 MG/2ML IJ SOLN
INTRAMUSCULAR | Status: DC | PRN
  Administered 2023-03-28: 2 mg via INTRAVENOUS

## 2023-03-28 MED ORDER — EPHEDRINE SULFATE-NACL 50-0.9 MG/10ML-% IV SOSY
PREFILLED_SYRINGE | INTRAVENOUS | Status: DC | PRN
  Administered 2023-03-28 (×3): 5 mg via INTRAVENOUS

## 2023-03-28 MED ORDER — PROPYLENE GLYCOL-GLYCERIN 1-0.3 % OP SOLN: 1.0000 [drp] | Freq: Three times a day (TID) | OPHTHALMIC | Status: DC | PRN

## 2023-03-28 MED ORDER — OXYCODONE HCL 5 MG/5ML PO SOLN: 5.0000 mg | Freq: Once | ORAL | Status: DC | PRN

## 2023-03-28 SURGICAL SUPPLY — 61 items
ADH SKN CLS APL DERMABOND .7 (GAUZE/BANDAGES/DRESSINGS) ×1
BAG COUNTER SPONGE SURGICOUNT (BAG) IMPLANT
BAG SPEC THK2 15X12 ZIP CLS (MISCELLANEOUS) ×1
BAG SPNG CNTER NS LX DISP (BAG) ×1
BAG ZIPLOCK 12X15 (MISCELLANEOUS) ×1 IMPLANT
BANDAGE ESMARK 6X9 LF (GAUZE/BANDAGES/DRESSINGS) ×1 IMPLANT
BNDG CMPR 5X6 CHSV STRCH STRL (GAUZE/BANDAGES/DRESSINGS)
BNDG CMPR 9X6 STRL LF SNTH (GAUZE/BANDAGES/DRESSINGS) ×1
BNDG CMPR MED 10X6 ELC LF (GAUZE/BANDAGES/DRESSINGS) ×1
BNDG COHESIVE 6X5 TAN ST LF (GAUZE/BANDAGES/DRESSINGS) ×1 IMPLANT
BNDG ELASTIC 6X10 VLCR STRL LF (GAUZE/BANDAGES/DRESSINGS) IMPLANT
BNDG ESMARK 6X9 LF (GAUZE/BANDAGES/DRESSINGS) ×1
BNDG GAUZE DERMACEA FLUFF 4 (GAUZE/BANDAGES/DRESSINGS) ×1 IMPLANT
BNDG GZE DERMACEA 4 6PLY (GAUZE/BANDAGES/DRESSINGS)
COVER SURGICAL LIGHT HANDLE (MISCELLANEOUS) ×1 IMPLANT
CUFF TOURN SGL QUICK 34 (TOURNIQUET CUFF)
CUFF TRNQT CYL 34X4.125X (TOURNIQUET CUFF) ×1 IMPLANT
DERMABOND ADVANCED .7 DNX12 (GAUZE/BANDAGES/DRESSINGS) IMPLANT
DRAPE EXTREMITY T 121X128X90 (DISPOSABLE) IMPLANT
DRAPE U-SHAPE 47X51 STRL (DRAPES) ×1 IMPLANT
DRSG AQUACEL AG ADV 3.5X10 (GAUZE/BANDAGES/DRESSINGS) IMPLANT
DRSG EMULSION OIL 3X16 NADH (GAUZE/BANDAGES/DRESSINGS) ×1 IMPLANT
DURAPREP 26ML APPLICATOR (WOUND CARE) ×1 IMPLANT
ELECT REM PT RETURN 15FT ADLT (MISCELLANEOUS) ×1 IMPLANT
GAUZE PAD ABD 8X10 STRL (GAUZE/BANDAGES/DRESSINGS) ×1 IMPLANT
GAUZE SPONGE 4X4 12PLY STRL (GAUZE/BANDAGES/DRESSINGS) ×1 IMPLANT
GLOVE BIO SURGEON STRL SZ 6.5 (GLOVE) IMPLANT
GLOVE BIO SURGEON STRL SZ7.5 (GLOVE) ×1 IMPLANT
GLOVE BIO SURGEON STRL SZ8 (GLOVE) ×2 IMPLANT
GLOVE BIOGEL PI IND STRL 7.0 (GLOVE) ×2 IMPLANT
GLOVE BIOGEL PI IND STRL 8 (GLOVE) ×3 IMPLANT
GLOVE SURG SS PI 7.0 STRL IVOR (GLOVE) ×1 IMPLANT
GOWN STRL REUS W/ TWL LRG LVL3 (GOWN DISPOSABLE) ×2 IMPLANT
GOWN STRL REUS W/TWL LRG LVL3 (GOWN DISPOSABLE) ×2
IMMOBILIZER KNEE 20 (SOFTGOODS) ×1
IMMOBILIZER KNEE 20 THIGH 36 (SOFTGOODS) IMPLANT
KIT BASIN OR (CUSTOM PROCEDURE TRAY) ×1 IMPLANT
KIT TURNOVER KIT A (KITS) IMPLANT
MANIFOLD NEPTUNE II (INSTRUMENTS) ×1 IMPLANT
NDL 1/2 CIR CATGUT .05X1.09 (NEEDLE) IMPLANT
NEEDLE 1/2 CIR CATGUT .05X1.09 (NEEDLE) ×1 IMPLANT
PACK TOTAL JOINT (CUSTOM PROCEDURE TRAY) ×1 IMPLANT
PADDING CAST COTTON 6X4 STRL (CAST SUPPLIES) ×2 IMPLANT
PASSER SUT SWANSON 36MM LOOP (INSTRUMENTS) ×1 IMPLANT
PROTECTOR NERVE ULNAR (MISCELLANEOUS) ×1 IMPLANT
SPIKE FLUID TRANSFER (MISCELLANEOUS) ×1 IMPLANT
STRIP CLOSURE SKIN 1/2X4 (GAUZE/BANDAGES/DRESSINGS) ×1 IMPLANT
SUT ETHIBOND NAB CT1 #1 30IN (SUTURE) ×2 IMPLANT
SUT MNCRL AB 4-0 PS2 18 (SUTURE) ×1 IMPLANT
SUT STEEL 7 (SUTURE) ×2 IMPLANT
SUT STRATAFIX 0 PDS 27 VIOLET (SUTURE) ×1
SUT VIC AB 0 CT1 27 (SUTURE)
SUT VIC AB 0 CT1 27XBRD ANTBC (SUTURE) ×2 IMPLANT
SUT VIC AB 1 CT1 27 (SUTURE)
SUT VIC AB 1 CT1 27XBRD ANTBC (SUTURE) ×1 IMPLANT
SUT VIC AB 2-0 CT1 27 (SUTURE) ×1
SUT VIC AB 2-0 CT1 TAPERPNT 27 (SUTURE) ×1 IMPLANT
SUTURE STRATFX 0 PDS 27 VIOLET (SUTURE) ×1 IMPLANT
TOWEL OR 17X26 10 PK STRL BLUE (TOWEL DISPOSABLE) ×2 IMPLANT
WATER STERILE IRR 1000ML POUR (IV SOLUTION) ×1 IMPLANT
WRAP KNEE MAXI GEL POST OP (GAUZE/BANDAGES/DRESSINGS) IMPLANT

## 2023-03-28 NOTE — Brief Op Note (Signed)
03/28/2023  2:43 PM  PATIENT:  Presley Raddle  67 y.o. female  PRE-OPERATIVE DIAGNOSIS:  Left patella fracture  POST-OPERATIVE DIAGNOSIS:  Left patella fracture  PROCEDURE:  Procedure(s): OPEN REDUCTION INTERNAL FIXATION (ORIF) PATELLA (Left)  SURGEON:  Surgeons and Role:    Ollen Gross, MD - Primary  PHYSICIAN ASSISTANT:   ASSISTANTS: Weston Brass, PA-C   ANESTHESIA:   regional and general  EBL:  50 mL   BLOOD ADMINISTERED:none  DRAINS: none   LOCAL MEDICATIONS USED:  NONE  COUNTS:  YES  TOURNIQUET:   Total Tourniquet Time Documented: Thigh (Left) - 33 minutes Total: Thigh (Left) - 33 minutes   DICTATION: .Other Dictation: Dictation Number [id number]  PLAN OF CARE: Admit for overnight observation  PATIENT DISPOSITION:  PACU - hemodynamically stable.

## 2023-03-28 NOTE — Anesthesia Preprocedure Evaluation (Signed)
Anesthesia Evaluation  Patient identified by MRN, date of birth, ID band Patient awake    Reviewed: Allergy & Precautions, NPO status , Patient's Chart, lab work & pertinent test results, reviewed documented beta blocker date and time   History of Anesthesia Complications Negative for: history of anesthetic complications  Airway Mallampati: III  TM Distance: >3 FB     Dental no notable dental hx. (+) Partial Upper, Partial Lower   Pulmonary sleep apnea and Continuous Positive Airway Pressure Ventilation , former smoker   breath sounds clear to auscultation       Cardiovascular hypertension, (-) angina (-) CAD, (-) Past MI, (-) Cardiac Stents and (-) CABG + dysrhythmias  Rhythm:Regular Rate:Normal     Neuro/Psych  Headaches PSYCHIATRIC DISORDERS Anxiety Depression     Neuromuscular disease    GI/Hepatic ,GERD  ,,  Endo/Other  diabetes    Renal/GU      Musculoskeletal  (+) Arthritis ,    Abdominal   Peds  Hematology   Anesthesia Other Findings   Reproductive/Obstetrics                              Anesthesia Physical Anesthesia Plan  ASA: 2  Anesthesia Plan: General   Post-op Pain Management:    Induction:   PONV Risk Score and Plan:   Airway Management Planned: LMA  Additional Equipment:   Intra-op Plan:   Post-operative Plan: Extubation in OR  Informed Consent:      Dental advisory given  Plan Discussed with: CRNA  Anesthesia Plan Comments:          Anesthesia Quick Evaluation

## 2023-03-28 NOTE — Discharge Instructions (Addendum)
Monica Gross, MD Total Joint Specialist [facility] [address], [address] [place], [place] [address] [phone]  TOTAL KNEE REPLACEMENT POSTOPERATIVE DIRECTIONS    Knee Rehabilitation, Guidelines Following Surgery  Results after knee surgery are often greatly improved when you follow the exercise, range of motion and muscle strengthening exercises prescribed by your doctor. Safety measures are also important to protect the knee from further injury. If any of these exercises cause you to have increased pain or swelling in your knee joint, decrease the amount until you are comfortable again and slowly increase them. If you have problems or questions, call your caregiver or physical therapist for advice.   HOME CARE INSTRUCTIONS  Remove items at home which could result in a fall. This includes throw rugs or furniture in walking pathways.  ICE to the affected knee as much as tolerated. Icing helps control swelling. If the swelling is well controlled you will be more comfortable and rehab easier. Continue to use ice on the knee for pain and swelling from surgery. You may notice swelling that will progress down to the foot and ankle. This is normal after surgery. Elevate the leg when you are not up walking on it.    Continue to use the breathing machine which will help keep your temperature down. It is common for your temperature to cycle up and down following surgery, especially at night when you are not up moving around and exerting yourself. The breathing machine keeps your lungs expanded and your temperature down. Do not place pillow under the operative knee, focus on keeping the knee straight while resting  DIET You may resume your previous home diet once you are discharged from the hospital.  DRESSING / WOUND CARE / SHOWERING Keep your bulky bandage on for 2 days. On the third post-operative day you may remove the Ace bandage and gauze. There is a waterproof  adhesive bandage on your skin which will stay in place until your first follow-up appointment. Once you remove this you will not need to place another bandage You may begin showering 3 days following surgery, but do not submerge the incision under water.  ACTIVITY For the first 5 days, the key is rest and control of pain and swelling You should rest, ice and elevate the leg for 50 minutes out of every hour. Get up and walk with immobilizer in place for 10 minutes per hour. After 5 days you can increase your activity slowly as tolerated. Walk with your walker as instructed. Avoid periods of inactivity such as sitting longer than an hour when not asleep. This helps prevent blood clots.  You may return to work once you are cleared by your doctor.  Do not drive a car for 6 weeks or until released by your surgeon.  Do not drive while taking narcotics.  TED HOSE STOCKINGS Wear the elastic stockings on both legs for three weeks following surgery during the day. You may remove them at night for sleeping.  WEIGHT BEARING Weight bearing as tolerated with assist device (walker, cane, etc) as directed, use it as long as suggested by your surgeon or therapist, typically at least 4-6 weeks. Knee immobilizer brace should be in place at all times.  POSTOPERATIVE CONSTIPATION PROTOCOL Constipation - defined medically as fewer than three stools per week and severe constipation as less than one stool per week.  One of the most common issues patients have following surgery is constipation.  Even if you have a regular bowel  pattern at home, your normal regimen is likely to be disrupted due to multiple reasons following surgery.  Combination of anesthesia, postoperative narcotics, change in appetite and fluid intake all can affect your bowels.  In order to avoid complications following surgery, here are some recommendations in order to help you during your recovery period.  Colace (docusate) - Pick up an  over-the-counter form of Colace or another stool softener and take twice a day as long as you are requiring postoperative pain medications.  Take with a full glass of water daily.  If you experience loose stools or diarrhea, hold the colace until you stool forms back up. If your symptoms do not get better within 1 week or if they get worse, check with your doctor. Dulcolax (bisacodyl) - Pick up over-the-counter and take as directed by the product packaging as needed to assist with the movement of your bowels.  Take with a full glass of water.  Use this product as needed if not relieved by Colace only.  MiraLax (polyethylene glycol) - Pick up over-the-counter to have on hand. MiraLax is a solution that will increase the amount of water in your bowels to assist with bowel movements.  Take as directed and can mix with a glass of water, juice, soda, coffee, or tea. Take if you go more than two days without a movement. Do not use MiraLax more than once per day. Call your doctor if you are still constipated or irregular after using this medication for 7 days in a row.  If you continue to have problems with postoperative constipation, please contact the office for further assistance and recommendations.  If you experience "the worst abdominal pain ever" or develop nausea or vomiting, please contact the office immediatly for further recommendations for treatment.  ITCHING If you experience itching with your medications, try taking only a single pain pill, or even half a pain pill at a time.  You can also use Benadryl over the counter for itching or also to help with sleep.   MEDICATIONS See your medication summary on the "After Visit Summary" that the nursing staff will review with you prior to discharge.  You may have some home medications which will be placed on hold until you complete the course of blood thinner medication.  It is important for you to complete the blood thinner medication as prescribed by your  surgeon.  Continue your approved medications as instructed at time of discharge.  PRECAUTIONS If you experience chest pain or shortness of breath - call 911 immediately for transfer to the hospital emergency department.  If you develop a fever greater that 101 F, purulent drainage from wound, increased redness or drainage from wound, foul odor from the wound/dressing, or calf pain - CONTACT YOUR SURGEON.                                                   FOLLOW-UP APPOINTMENTS Make sure you keep all of your appointments after your operation with your surgeon and caregivers. You should call the office at the above phone number and make an appointment for approximately two weeks after the date of your surgery or on the date instructed by your surgeon outlined in the "After Visit Summary".  RANGE OF MOTION AND STRENGTHENING EXERCISES  Rehabilitation of the knee is important following a knee injury or  an operation. After just a few days of immobilization, the muscles of the thigh which control the knee become weakened and shrink (atrophy). Knee exercises are designed to build up the tone and strength of the thigh muscles and to improve knee motion. Often times heat used for twenty to thirty minutes before working out will loosen up your tissues and help with improving the range of motion but do not use heat for the first two weeks following surgery. These exercises can be done on a training (exercise) mat, on the floor, on a table or on a bed. Use what ever works the best and is most comfortable for you Knee exercises include:  Leg Lifts - While your knee is still immobilized in a splint or cast, you can do straight leg raises. Lift the leg to 60 degrees, hold for 3 sec, and slowly lower the leg. Repeat 10-20 times 2-3 times daily. Perform this exercise against resistance later as your knee gets better.  Quad and Hamstring Sets - Tighten up the muscle on the front of the thigh (Quad) and hold for 5-10 sec.  Repeat this 10-20 times hourly. Hamstring sets are done by pushing the foot backward against an object and holding for 5-10 sec. Repeat as with quad sets.  Leg Slides: Lying on your back, slowly slide your foot toward your buttocks, bending your knee up off the floor (only go as far as is comfortable). Then slowly slide your foot back down until your leg is flat on the floor again. Angel Wings: Lying on your back spread your legs to the side as far apart as you can without causing discomfort.  A rehabilitation program following serious knee injuries can speed recovery and prevent re-injury in the future due to weakened muscles. Contact your doctor or a physical therapist for more information on knee rehabilitation.   POST-OPERATIVE OPIOID TAPER INSTRUCTIONS: It is important to wean off of your opioid medication as soon as possible. If you do not need pain medication after your surgery it is ok to stop day one. Opioids include: Codeine, Hydrocodone(Norco, Vicodin), Oxycodone(Percocet, oxycontin) and hydromorphone amongst others.  Long term and even short term use of opiods can cause: Increased pain response Dependence Constipation Depression Respiratory depression And more.  Withdrawal symptoms can include Flu like symptoms Nausea, vomiting And more Techniques to manage these symptoms Hydrate well Eat regular healthy meals Stay active Use relaxation techniques(deep breathing, meditating, yoga) Do Not substitute Alcohol to help with tapering If you have been on opioids for less than two weeks and do not have pain than it is ok to stop all together.  Plan to wean off of opioids This plan should start within one week post op of your joint replacement. Maintain the same interval or time between taking each dose and first decrease the dose.  Cut the total daily intake of opioids by one tablet each day Next start to increase the time between doses. The last dose that should be eliminated is  the evening dose.   IF YOU ARE TRANSFERRED TO A SKILLED REHAB FACILITY If the patient is transferred to a skilled rehab facility following release from the hospital, a list of the current medications will be sent to the facility for the patient to continue.  When discharged from the skilled rehab facility, please have the facility set up the patient's Home Health Physical Therapy prior to being released. Also, the skilled facility will be responsible for providing the patient with their medications at  time of release from the facility to include their pain medication, the muscle relaxants, and their blood thinner medication. If the patient is still at the rehab facility at time of the two week follow up appointment, the skilled rehab facility will also need to assist the patient in arranging follow up appointment in our office and any transportation needs.  MAKE SURE YOU:  Understand these instructions.  Get help right away if you are not doing well or get worse.   DENTAL ANTIBIOTICS:  In most cases prophylactic antibiotics for Dental procdeures after total joint surgery are not necessary.  Exceptions are as follows:  1. History of prior total joint infection  2. Severely immunocompromised (Organ Transplant, cancer chemotherapy, Rheumatoid biologic meds such as Humera)  3. Poorly controlled diabetes (A1C &gt; 8.0, blood glucose over 200)  If you have one of these conditions, contact your surgeon for an antibiotic prescription, prior to your dental procedure.    Pick up stool softner and laxative for home use following surgery while on pain medications. Do not submerge incision under water. Please use good hand washing techniques while changing dressing each day. May shower starting three days after surgery. Please use a clean towel to pat the incision dry following showers. Continue to use ice for pain and swelling after surgery. Do not use any lotions or creams on the incision until  instructed by your surgeon. +    Information on my medicine - XARELTO (Rivaroxaban)  Why was Xarelto prescribed for you? Xarelto was prescribed for you to reduce the risk of blood clots forming after orthopedic surgery. The medical term for these abnormal blood clots is venous thromboembolism (VTE).  What do you need to know about xarelto ? Take your Xarelto ONCE DAILY at the same time every day. You may take it either with or without food.  If you have difficulty swallowing the tablet whole, you may crush it and mix in applesauce just prior to taking your dose.  Take Xarelto exactly as prescribed by your doctor and DO NOT stop taking Xarelto without talking to the doctor who prescribed the medication.  Stopping without other VTE prevention medication to take the place of Xarelto may increase your risk of developing a clot.  After discharge, you should have regular check-up appointments with your healthcare provider that is prescribing your Xarelto.    What do you do if you miss a dose? If you miss a dose, take it as soon as you remember on the same day then continue your regularly scheduled once daily regimen the next day. Do not take two doses of Xarelto on the same day.   Important Safety Information A possible side effect of Xarelto is bleeding. You should call your healthcare provider right away if you experience any of the following: Bleeding from an injury or your nose that does not stop. Unusual colored urine (red or dark brown) or unusual colored stools (red or black). Unusual bruising for unknown reasons. A serious fall or if you hit your head (even if there is no bleeding).  Some medicines may interact with Xarelto and might increase your risk of bleeding while on Xarelto. To help avoid this, consult your healthcare provider or pharmacist prior to using any new prescription or non-prescription medications, including herbals, vitamins, non-steroidal  anti-inflammatory drugs (NSAIDs) and supplements.  This website has more information on Xarelto: VisitDestination.com.br.

## 2023-03-28 NOTE — Op Note (Unsigned)
NAME: Monica [name], CITIZEN MEDICAL RECORD NO: [id number] ACCOUNT NO: [id number] DATE OF BIRTH: November 13, 1955 FACILITY: [facility] LOCATION: [facility] PHYSICIAN: Gus Rankin. , MD  Operative Report   DATE OF PROCEDURE: 03/28/2023  PREOPERATIVE DIAGNOSIS:  Left patellar fracture.  POSTOPERATIVE DIAGNOSIS:  Left patellar fracture.  PROCEDURE:  Open reduction internal fixation, left patellar fracture.  SURGEON:  Gus Rankin. , MD  ASSISTANT:  Weston Brass, PA-C.  ANESTHESIA:  General and adductor canal block.  ESTIMATED BLOOD LOSS:  50 mL.  DRAINS:  None.  COMPLICATIONS:  None.  TOURNIQUET TIME:  33 minutes at 300 mmHg.  CONDITION:  Stable to recovery.  BRIEF CLINICAL NOTE:  The patient is a 67 year old female who had a fall approximately 2 weeks ago sustaining a left patellar fracture.  It was equivalent of a tendon avulsion as it was a small fragment of the inferior pole of the patella.  Extensor  mechanism was disrupted and she could not extend her knee.  She presents now for open reduction internal fixation of the fracture.  DESCRIPTION OF PROCEDURE:  After successful administration of adductor canal block and general anesthetic, a tourniquet was placed on her left thigh and left lower extremity, prepped and draped in the usual sterile fashion.  Extremity was wrapped in  Esmarch, tourniquet inflated to 300 mmHg.  Midline incision was made with a 10 blade through subcutaneous tissue to the level of the extensor mechanism.  There was an obvious disruption in the retinaculum and a transverse fracture of the patella.  This  is a very small fragment off the inferior pole.  Essentially, this making this consistent with a patellar tendon avulsion.  The fragment was too small for any kind of hardware fixation, so I took a two #1 FiberWire sutures, one in the medial part of the  tendon, one in the lateral part of the tendon and did a Bunnell stitch starting distally on the tibial tubercle  and then coursing proximally to the superior part of the tendon.  I did incorporate that bony fragment in the stitch.  We then drilled 3 holes  through the patella.  We passed the sutures through those holes.  The sutures were tied off to each other and this to be very stable repair.  We oversewed this superficially with Ethibond suture.  I flexed down to 60 and it was absolutely no gapping at  all in the repair.  The wound had been copiously irrigated with saline solution prior to suturing this down.  We irrigated again.  Tourniquet was released, total time of 33 minutes.  Subcutaneous tissue was closed with interrupted 2-0 Vicryl and  subcuticular running 4-0 Monocryl.  Incisions cleaned and dried and a sterile dressing applied.  She was then awakened and transported to recovery in stable condition.  She was in a knee immobilizer and will not be bending the knee for the next 4-6  weeks.  Note that a surgical assistant was of medical necessity for this procedure to do it in a safe and expeditious manner.  Surgical assistant was necessary for aiding in reduction of this as well as aiding in the repair of the tendon.   PUS D: 03/28/2023 2:48:46 pm T: 03/28/2023 3:34:00 pm  JOB: [id number]/ [id number]

## 2023-03-28 NOTE — Transfer of Care (Signed)
Immediate Anesthesia Transfer of Care Note  Patient: Monica [name]  Procedure(s) Performed: OPEN REDUCTION INTERNAL FIXATION (ORIF) PATELLA (Left: Knee)  Patient Location: PACU  Anesthesia Type:General and GA combined with regional for post-op pain  Level of Consciousness: drowsy  Airway & Oxygen Therapy: Patient Spontanous Breathing and Patient connected to face mask  Post-op Assessment: Report given to RN and Post -op Vital signs reviewed and stable  Post vital signs: Reviewed and stable  Last Vitals:  Vitals Value Taken Time  BP    Temp    Pulse 67 03/28/23 1520  Resp 16 03/28/23 1520  SpO2 100 % 03/28/23 1520  Vitals shown include unfiled device data.  Last Pain:  Vitals:   03/28/23 1209  TempSrc: Oral  PainSc: 1          Complications: No notable events documented.

## 2023-03-28 NOTE — Interval H&P Note (Signed)
History and Physical Interval Note:  03/28/2023 12:45 PM  Monica [name]  has presented today for surgery, with the diagnosis of Left patella fracture.  The various methods of treatment have been discussed with the patient and family. After consideration of risks, benefits and other options for treatment, the patient has consented to  Procedure(s): OPEN REDUCTION INTERNAL FIXATION (ORIF) PATELLA (Left) as a surgical intervention.  The patient's history has been reviewed, patient examined, no change in status, stable for surgery.  I have reviewed the patient's chart and labs.  Questions were answered to the patient's satisfaction.     Homero Fellers 

## 2023-03-28 NOTE — Anesthesia Procedure Notes (Signed)
Procedure Name: LMA Insertion Date/Time: 03/28/2023 1:42 PM  Performed by: Vanessa Bloxom, CRNAPre-anesthesia Checklist: Emergency Drugs available, Patient identified, Suction available and Patient being monitored Patient Re-evaluated:Patient Re-evaluated prior to induction Oxygen Delivery Method: Circle system utilized Preoxygenation: Pre-oxygenation with 100% oxygen Induction Type: IV induction Ventilation: Mask ventilation without difficulty LMA: LMA inserted LMA Size: 4.0 Number of attempts: 1 Placement Confirmation: positive ETCO2 and breath sounds checked- equal and bilateral Tube secured with: Tape Dental Injury: Teeth and Oropharynx as per pre-operative assessment

## 2023-03-28 NOTE — Interval H&P Note (Signed)
History and Physical Interval Note:  03/28/2023 12:49 PM  Monica [name]  has presented today for surgery, with the diagnosis of Left patella fracture.  The various methods of treatment have been discussed with the patient and family. After consideration of risks, benefits and other options for treatment, the patient has consented to  Procedure(s): OPEN REDUCTION INTERNAL FIXATION (ORIF) PATELLA (Left) as a surgical intervention.  The patient's history has been reviewed, patient examined, no change in status, stable for surgery.  I have reviewed the patient's chart and labs.  Questions were answered to the patient's satisfaction.     Homero Fellers 

## 2023-03-28 NOTE — Anesthesia Postprocedure Evaluation (Signed)
Anesthesia Post Note  Patient: Monica [name]  Procedure(s) Performed: OPEN REDUCTION INTERNAL FIXATION (ORIF) PATELLA (Left: Knee)     Patient location during evaluation: PACU Anesthesia Type: General Level of consciousness: awake and alert Pain management: pain level controlled Vital Signs Assessment: post-procedure vital signs reviewed and stable Respiratory status: spontaneous breathing, nonlabored ventilation, respiratory function stable and patient connected to nasal cannula oxygen Cardiovascular status: blood pressure returned to baseline and stable Postop Assessment: no apparent nausea or vomiting Anesthetic complications: no   No notable events documented.  Last Vitals:  Vitals:   03/28/23 1615 03/28/23 1630  BP: (!) 143/79 (!) 145/79  Pulse: 70 71  Resp: 17 15  Temp:    SpO2: 99% 100%    Last Pain:  Vitals:   03/28/23 1530  TempSrc:   PainSc: 0-No pain                 Mariann Barter

## 2023-03-28 NOTE — Anesthesia Procedure Notes (Addendum)
Anesthesia Regional Block: Adductor canal block   Pre-Anesthetic Checklist: , timeout performed,  Correct Patient, Correct Site, Correct Laterality,  Correct Procedure, Correct Position, site marked,  Risks and benefits discussed,  Surgical consent,  Pre-op evaluation,  At surgeon's request and post-op pain management  Laterality: Left  Prep: Maximum Sterile Barrier Precautions used, chloraprep       Needles:  Injection technique: Single-shot  Needle Type: Echogenic Needle      Needle Gauge: 20     Additional Needles:   Procedures:,,,, ultrasound used (permanent image in chart),,    Narrative:  Start time: 03/28/2023 12:48 PM End time: 03/28/2023 12:52 PM Injection made incrementally with aspirations every 5 mL.  Performed by: Personally  Anesthesiologist: Mariann Barter, MD

## 2023-03-28 NOTE — H&P (Signed)
CC- Monica [name] is a 67 y.o. female who presents with left knee pain.  HPI- . Knee Pain: Patient presents with a knee injury involving the  left knee. Onset of the symptoms was several days ago. Inciting event:  She had a fall and landed on her left knee with immediate pain and deformity and inability to straighten the knee . Current symptoms include giving out, pain located anteriorly, and swelling.  Evaluation to date: plain films: abnormal Left patella fracture . Treatment to date:  Knee immobilizer .  Past Medical History:  Diagnosis Date   Allergic conjunctivitis 03/12/2015   Anxiety    Atrial fibrillation (HCC)    a. s/p DCCV in 12/2012 b. 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   History of AVN   Hypertension    IFG (impaired fasting glucose) 10/14/2013   Itchy eyes 03/12/2015   Morbid obesity (HCC)    Osteoarthritis    Personal history of colonic polyps - large hyperplastic 12/25/2013   Pre-diabetes    Psoriasis    active breakout left buttocks   Right hip pain    Right knee pain 07/06/2016   Skin infection 10/14/2013   Sleep apnea    uses mouth guard only   Swelling of limb 11/30/2012   Tachycardia, unspecified     Past Surgical History:  Procedure Laterality Date   CARDIOVERSION N/A 01/09/2013   Procedure: CARDIOVERSION;  Surgeon: Lewayne Bunting, MD;  Location: [facility] ENDOSCOPY;  Service: Cardiovascular;  Laterality: N/A;   CARDIOVERSION N/A 10/07/2014   Procedure: CARDIOVERSION;  Surgeon: Lewayne Bunting, MD;  Location: [facility] ENDOSCOPY;  Service: Cardiovascular;  Laterality: N/A;  09:00 Lido 60mg ,IV followed by Propofol  70mg /IV    synched  electrocardioversion at 120 joules for Afib,repeated at 200 joules, 70 mg...successfully changed to SR   CARDIOVERSION N/A 12/22/2021   Procedure: CARDIOVERSION;  Surgeon: Little Ishikawa, MD;  Location: [facility] ENDOSCOPY;  Service: Cardiovascular;  Laterality: N/A;   FRACTURE SURGERY  07/21/12   right hip arthroplasty   JOINT REPLACEMENT     Left hip Dr. Magnus Ivan 04-07-18   TOTAL HIP ARTHROPLASTY  07/21/2012   Procedure: TOTAL HIP ARTHROPLASTY;  Surgeon: Loanne Drilling, MD;  Location: [facility] ORS;  Service: Orthopedics;  Laterality: Right;   TOTAL HIP ARTHROPLASTY Left 04/07/2018   Procedure: LEFT TOTAL HIP ARTHROPLASTY ANTERIOR APPROACH;  Surgeon: Kathryne Hitch, MD;  Location: [facility] ORS;  Service: Orthopedics;  Laterality: Left;  Needs RNFA    Prior to Admission medications   Medication Sig Start Date End Date Taking? Authorizing Provider  acetaminophen (TYLENOL) 650 MG CR tablet Take 650-1,300 mg by mouth every 8 (eight) hours as needed for pain.    [provider]  Blood Pressure Monitor MISC Use to check blood pressure once daily 05/08/21   Alver Sorrow, NP  calcium carbonate (TUMS EX) 750 MG chewable tablet Chew 1-3 tablets by mouth 3 (three) times daily as needed for heartburn.    [provider]  Cholecalciferol (VITAMIN D3) 2000 units TABS Take 2,000 Units by mouth every evening.    [provider]  clobetasol ointment (TEMOVATE) 0.05 %  Apply 1 application on the skin DAILY Apply daily to thick areas 08/13/21     clobetasol ointment (TEMOVATE) 0.05 % Apply 1 application on the affected areas daily. 01/27/23     desonide (DESOWEN) 0.05 % cream Apply 1 application topically 2 (two) times daily as needed (for psoriasis).  11/04/17   [provider]  diclofenac sodium (VOLTAREN) 1 % GEL Apply 4 g topically 4 (four) times daily. Patient taking differently: Apply 4 g topically 4 (four) times daily as needed (pain). 07/01/16   Hudnall, Azucena Fallen, MD   Dulaglutide (TRULICITY) 3 MG/0.5ML SOPN Inject 0.5 mLs (3 mg total) into the skin every 7 days. 08/10/22   Sharlene Dory, DO  flecainide (TAMBOCOR) 100 MG tablet Take 1 tablet (100 mg total) by mouth 2 (two) times daily. 03/25/22 04/21/23  Tollie Eth, NP  fluticasone (FLONASE) 50 MCG/ACT nasal spray PLACE 2 SPRAYS IN EACH NOSTRIL EVERY DAY 03/25/22   Early, Sung Amabile, NP  folic acid (FOLVITE) 1 MG tablet TAKE 2 TABLETS BY MOUTH DAILY 01/15/22     hydrochlorothiazide (HYDRODIURIL) 25 MG tablet Take 1 tablet (25 mg total) by mouth every morning. 08/10/22 08/10/23  Sharlene Dory, DO  methotrexate (RHEUMATREX) 2.5 MG tablet Take 6 tablet by mouth once a week Patient taking differently: Take 2.5 mg by mouth once a week. Pt. Takes 5 tablets once a week. 06/19/21     methotrexate (RHEUMATREX) 2.5 MG tablet Take 5 tablets (12.5 mg total) by mouth once a week. 10/28/22     methotrexate (RHEUMATREX) 2.5 MG tablet Take 5 tablets (12.5 mg total) by mouth once a week. 01/03/23     Multiple Vitamin (MULTIVITAMIN WITH MINERALS) TABS tablet Take 1 tablet by mouth daily.    [provider]  nebivolol (BYSTOLIC) 10 MG tablet Take 0.5 tablets (5 mg total) by mouth daily. 10/25/22   Joylene Grapes, NP  oxyCODONE (OXY IR/ROXICODONE) 5 MG immediate release tablet Take 1 tablet (5 mg total) by mouth 2 (two) times daily as needed for pain 03/22/23     oxyCODONE-acetaminophen (PERCOCET/ROXICET) 5-325 MG tablet Take 1 tablet by mouth every 6 (six) hours as needed for severe pain. 03/16/23   Gareth Eagle, PA-C  potassium chloride (KLOR-CON) 10 MEQ tablet TAKE 2 TABLETS BY MOUTH TWICE DAILY 02/11/21     Propylene Glycol-Glycerin 1-0.3 % SOLN Place 1 drop into both eyes 3 (three) times daily as needed (for dry eyes).    [provider]  risankizumab-rzaa (SKYRIZI PEN) 150 MG/ML pen Inject 1 pen injector subcutaneously every four weeks Inject at week 0 and week 4, LOADING DOSE. 11/30/22      Risankizumab-rzaa (SKYRIZI PEN) 150 MG/ML SOAJ Inject 1 pen injector subcutaneously every three months Start on week 16, MAINTENANCE DOSE. 11/30/22     rivaroxaban (XARELTO) 20 MG TABS tablet Take 1 tablet (20 mg total) by mouth daily. 09/21/22 09/21/23  Sharlene Dory, DO  sertraline (ZOLOFT) 50 MG tablet Take 1.5 tablets (75 mg total) by mouth at bedtime. 08/10/22   Sharlene Dory, DO   Left knee exam antalgic gait, soft tissue tenderness over anterior knee with palpable defect, ecchymosis of anterior knee, effusion, inability to actively extend knee  Physical Examination: General appearance - alert, well appearing, and in no distress Mental status - alert, oriented to person, place, and time Chest - clear to auscultation, no wheezes, rales or rhonchi, symmetric air entry Heart - normal rate, regular rhythm, normal S1, S2,  no murmurs, rubs, clicks or gallops Abdomen - soft, nontender, nondistended, no masses or organomegaly Neurological - alert, oriented, normal speech, no focal findings or movement disorder noted   Asessment/Plan--- Left patella fracture- - Plan ORIF left patella. Procedure risks and potential comps discussed with patient who elects to proceed. Goals are decreased pain and increased function with a high likelihood of achieving both

## 2023-03-29 ENCOUNTER — Encounter (HOSPITAL_COMMUNITY): Payer: Self-pay | Admitting: Orthopedic Surgery

## 2023-03-29 ENCOUNTER — Other Ambulatory Visit (HOSPITAL_BASED_OUTPATIENT_CLINIC_OR_DEPARTMENT_OTHER): Payer: Self-pay

## 2023-03-29 DIAGNOSIS — S82002A Unspecified fracture of left patella, initial encounter for closed fracture: Secondary | ICD-10-CM | POA: Diagnosis not present

## 2023-03-29 LAB — GLUCOSE, CAPILLARY: Glucose-Capillary: 106 mg/dL — ABNORMAL HIGH (ref 70–99)

## 2023-03-29 MED ORDER — OXYCODONE HCL 5 MG PO TABS
5.0000 mg | ORAL_TABLET | Freq: Four times a day (QID) | ORAL | 0 refills | Status: DC | PRN
Start: 2023-03-29 — End: 2023-06-21
  Filled 2023-03-29: qty 30, 4d supply, fill #0

## 2023-03-29 MED ORDER — METHOCARBAMOL 500 MG PO TABS
500.0000 mg | ORAL_TABLET | Freq: Four times a day (QID) | ORAL | 0 refills | Status: DC | PRN
  Filled 2023-03-29: qty 40, 10d supply, fill #0

## 2023-03-29 NOTE — Progress Notes (Signed)
Subjective: 1 Day Post-Op Procedure(s) (LRB): OPEN REDUCTION INTERNAL FIXATION (ORIF) PATELLA (Left) Patient reports pain as mild.   Patient seen in rounds by Dr. Lequita Halt. Patient is doing well this AM, states she is ready to go home. No issues overnight, voiding without difficulty.  We will begin therapy today.   Objective: Vital signs in last 24 hours: Temp:  [97.2 F (36.2 C)-100.1 F (37.8 C)] 98.7 F (37.1 C) (08/13 0632) Pulse Rate:  [54-78] 69 (08/13 0632) Resp:  [14-20] 17 (08/13 1610) BP: (105-154)/(52-98) 110/66 (08/13 9604) SpO2:  [86 %-100 %] 95 % (08/13 5409) Weight:  [104.3 kg] 104.3 kg (08/12 1209)  Intake/Output from previous day:  Intake/Output Summary (Last 24 hours) at 03/29/2023 0837 Last data filed at 03/29/2023 0600 Gross per 24 hour  Intake 2454 ml  Output 50 ml  Net 2404 ml     Intake/Output this shift: No intake/output data recorded.  Labs: Recent Labs    03/28/23 1200 03/29/23 0416  HGB 10.6* 10.3*   Recent Labs    03/28/23 1200 03/29/23 0416  WBC 5.1 8.0  RBC 3.54* 3.39*  HCT 33.0* 32.1*  PLT 299 269   Recent Labs    03/28/23 1200 03/29/23 0416  NA 139 137  K 3.8 3.6  CL 105 102  CO2 26 25  BUN 13 11  CREATININE 0.84 0.68  GLUCOSE 90 125*  CALCIUM 8.9 8.4*   No results for input(s): "LABPT", "INR" in the last 72 hours.  Exam: General - Patient is Alert and Oriented Extremity - Neurologically intact Neurovascular intact Sensation intact distally Dorsiflexion/Plantar flexion intact Dressing - dressing C/D/I. Knee immobilizer in place. Motor Function - intact, moving foot and toes well on exam.   Past Medical History:  Diagnosis Date   Allergic conjunctivitis 03/12/2015   Anxiety    Atrial fibrillation (HCC)    a. s/p DCCV in 12/2012 b. 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   History of AVN   Hypertension    IFG (impaired fasting glucose) 10/14/2013   Itchy eyes 03/12/2015   Morbid obesity (HCC)    Osteoarthritis    Personal history of colonic polyps - large hyperplastic 12/25/2013   Pre-diabetes    Psoriasis    active breakout left buttocks   Right hip pain    Right knee pain 07/06/2016   Skin infection 10/14/2013   Sleep apnea    uses mouth guard only   Swelling of limb 11/30/2012   Tachycardia, unspecified     Assessment/Plan: 1 Day Post-Op Procedure(s) (LRB): OPEN REDUCTION INTERNAL FIXATION (ORIF) PATELLA (Left) Principal Problem:   Left patella fracture  Estimated body mass index is 37.69 kg/m as calculated from the following:   Height as of this encounter: 5' 5.5" (1.664 m).   Weight as of this encounter: 104.3 kg. Up with therapy  DVT Prophylaxis - Xarelto Weight bearing as tolerated. Begin therapy.  NO ROM to left knee Maintain knee immobilizer at all times.   Plan is to go Home after hospital stay. Discharge to home today, will not need physical therapy at home until she is able to d/c the  knee immobilizer.  Will follow-up in our office in 2 weeks  The PDMP database was reviewed today prior to any opioid medications being prescribed to this patient.  Arther Abbott, PA-C Orthopedic Surgery [phone] 03/29/2023, 8:37 AM

## 2023-03-29 NOTE — Plan of Care (Signed)
  Problem: Education: Goal: Knowledge of the prescribed therapeutic regimen will improve Outcome: Progressing Goal: Individualized Educational Video(s) Outcome: Completed/Met   Problem: Activity: Goal: Ability to avoid complications of mobility impairment will improve Outcome: Progressing Goal: Range of joint motion will improve Outcome: Not Applicable   Problem: Clinical Measurements: Goal: Postoperative complications will be avoided or minimized Outcome: Progressing   Problem: Pain Management: Goal: Pain level will decrease with appropriate interventions Outcome: Adequate for Discharge   Problem: Skin Integrity: Goal: Will show signs of wound healing Outcome: Progressing   Problem: Education: Goal: Knowledge of General Education information will improve Description: Including pain rating scale, medication(s)/side effects and non-pharmacologic comfort measures Outcome: Progressing   Problem: Health Behavior/Discharge Planning: Goal: Ability to manage health-related needs will improve Outcome: Progressing   Problem: Clinical Measurements: Goal: Ability to maintain clinical measurements within normal limits will improve Outcome: Progressing Goal: Will remain free from infection Outcome: Progressing Goal: Diagnostic test results will improve Outcome: Progressing Goal: Respiratory complications will improve Outcome: Progressing Goal: Cardiovascular complication will be avoided Outcome: Progressing   Problem: Activity: Goal: Risk for activity intolerance will decrease Outcome: Progressing   Problem: Nutrition: Goal: Adequate nutrition will be maintained Outcome: Completed/Met   Problem: Coping: Goal: Level of anxiety will decrease Outcome: Progressing   Problem: Elimination: Goal: Will not experience complications related to bowel motility Outcome: Progressing Goal: Will not experience complications related to urinary retention Outcome: Completed/Met    Problem: Pain Managment: Goal: General experience of comfort will improve Outcome: Progressing   Problem: Safety: Goal: Ability to remain free from injury will improve Outcome: Progressing   Problem: Skin Integrity: Goal: Risk for impaired skin integrity will decrease Outcome: Adequate for Discharge

## 2023-03-29 NOTE — TOC Transition Note (Signed)
Transition of Care [facility]) - CM/SW Discharge Note   Patient Details  Name: Monica [name] MRN: [id number] Date of Birth: 08/01/1956  [facility] CM/SW Contact:  Amada Jupiter, LCSW Phone Number: 03/29/2023, 12:44 PM   Clinical Narrative:     Met with pt and confirming she has needed DME in the home.  No follow therapy until she has appointment with Dr. Lequita Halt.  No further TOC needs.  Final next level of care: Home/Self Care Barriers to Discharge: No Barriers Identified   Patient Goals and CMS Choice      Discharge Placement                         Discharge Plan and Services Additional resources added to the After Visit Summary for                  DME Arranged: N/A DME Agency: NA                  Social Determinants of Health (SDOH) Interventions SDOH Screenings   Food Insecurity: No Food Insecurity (03/28/2023)  Housing: Low Risk  (03/28/2023)  Transportation Needs: No Transportation Needs (03/28/2023)  Utilities: Not At Risk (03/28/2023)  Depression (PHQ2-9): Medium Risk (09/10/2021)  Financial Resource Strain: Low Risk  (03/30/2018)  Physical Activity: Unknown (03/30/2018)  Social Connections: Unknown (03/30/2018)  Tobacco Use: Medium Risk (03/28/2023)     Readmission Risk Interventions     No data to display

## 2023-03-29 NOTE — Progress Notes (Signed)
Physical Therapy Treatment Patient Details Name: Monica [name] MRN: [id number] DOB: [birth year] Today's Date: 03/29/2023   History of Present Illness 67 yo female S/P ORIF L patella fracture 03/28/23., PMH: afib, L/ R THA    PT Comments  The patient practiced 1 step and performed safely. Patient reports that she has a rollator that  she has been using as well as a SPC. Instructed to use rollator. Instructed patient in proper position of the KI, that she needs to adjust as needed when it slides down.Patient has met PT goals.    If plan is discharge home, recommend the following: A little help with walking and/or transfers;A little help with bathing/dressing/bathroom;Assistance with cooking/housework;Assist for transportation;Help with stairs or ramp for entrance   Can travel by private vehicle        Equipment Recommendations  BSC/3in1    Recommendations for Other Services       Precautions / Restrictions Precautions Precautions: Knee Required Braces or Orthoses: Knee Immobilizer - Left Knee Immobilizer - Left: On at all times Restrictions Weight Bearing Restrictions: No LLE Weight Bearing: Weight bearing as tolerated     Mobility  Bed Mobility Overal bed mobility: Needs Assistance Bed Mobility: Supine to Sit     Supine to sit: Min assist     General bed mobility comments: in recliner    Transfers Overall transfer level: Needs assistance Equipment used: Rolling walker (2 wheels) Transfers: Sit to/from Stand Sit to Stand: Contact guard assist           General transfer comment: pt, using belt on the left foot but was able to advance the leg forward    Ambulation/Gait Ambulation/Gait assistance: Contact guard assist Gait Distance (Feet): 30 Feet Assistive device: Rolling walker (2 wheels) Gait Pattern/deviations: Step-to pattern Gait velocity: decr     General Gait Details: able to step the  left leg forward   Stairs Stairs: Yes Stairs assistance:  Contact guard assist Stair Management: No rails, Forwards Number of Stairs: 1 General stair comments: practiced x 2  cues for sequence   Wheelchair Mobility     Tilt Bed    Modified Rankin (Stroke Patients Only)       Balance Overall balance assessment: Mild deficits observed, not formally tested                                          Cognition Arousal: Alert Behavior During Therapy: WFL for tasks assessed/performed Overall Cognitive Status: Within Functional Limits for tasks assessed                                          Exercises      General Comments        Pertinent Vitals/Pain Pain Assessment Pain Assessment: 0-10 Pain Score: 5  Pain Location: left knee Pain Descriptors / Indicators: Aching Pain Intervention(s): Patient requesting pain meds-RN notified, Repositioned    Home Living Family/patient expects to be discharged to:: Private residence Living Arrangements: Spouse/significant other Available Help at Discharge: Family;Available 24 hours/day Type of Home: House Home Access: Stairs to enter Entrance Stairs-Rails: None Entrance Stairs-Number of Steps: 1   Home Layout: Two level;1/2 bath on main level Home Equipment: Rollator (4 wheels);Cane - single point Additional Comments: has made bedroom down stairs,  Prior Function            PT Goals (current goals can now be found in the care plan section) Acute Rehab PT Goals Patient Stated Goal: go home PT Goal Formulation: With patient Time For Goal Achievement: 04/05/23 Potential to Achieve Goals: Good Progress towards PT goals: Progressing toward goals    Frequency    Min 6X/week      PT Plan      Co-evaluation              AM-PAC PT "6 Clicks" Mobility   Outcome Measure  Help needed turning from your back to your side while in a flat bed without using bedrails?: A Little Help needed moving from lying on your back to sitting on the  side of a flat bed without using bedrails?: A Little Help needed moving to and from a bed to a chair (including a wheelchair)?: A Little Help needed standing up from a chair using your arms (e.g., wheelchair or bedside chair)?: A Little Help needed to walk in hospital room?: A Little Help needed climbing 3-5 steps with a railing? : A Little 6 Click Score: 18    End of Session Equipment Utilized During Treatment: Left knee immobilizer;Gait belt Activity Tolerance: Patient tolerated treatment well Patient left: in chair;with call bell/phone within reach Nurse Communication: Mobility status PT Visit Diagnosis: Unsteadiness on feet (R26.81);Difficulty in walking, not elsewhere classified (R26.2);History of falling (Z91.81)     Time: 0981-1914 PT Time Calculation (min) (ACUTE ONLY): 21 min  Charges:    $Gait Training: 8-22 mins PT General Charges $$ ACUTE PT VISIT: 1 Visit                       Rada Hay 03/29/2023, 12:33 PM

## 2023-03-29 NOTE — Plan of Care (Signed)
Problem: Education: Goal: Ability to describe self-care measures that may prevent or decrease complications (Diabetes Survival Skills Education) will improve Outcome: Progressing   Problem: Coping: Goal: Ability to adjust to condition or change in health will improve Outcome: Progressing   Problem: Activity: Goal: Ability to avoid complications of mobility impairment will improve Outcome: Progressing   Problem: Clinical Measurements: Goal: Postoperative complications will be avoided or minimized Outcome: Progressing   Problem: Pain Management: Goal: Pain level will decrease with appropriate interventions Outcome: Progressing   Haydee Salter, RN 03/29/23 11:38 AM

## 2023-03-29 NOTE — Evaluation (Signed)
Physical Therapy Evaluation Patient Details Name: Monica [name] MRN: [id number] DOB: [birth year] Today's Date: 03/29/2023  History of Present Illness  67 yo female S/P ORIF L patella fracture 03/28/23., PMH: afib, L/ R THA  Clinical Impression   The patient is ambulating well. Patient  demonstrates use of gait belt on the Left foot to self assist  with lifting and lowering the leg   during transfers, " Like I did after  my hip surgery".  Patient progressing well, will practice 1 step , then should be ready for DC. Pt admitted with above diagnosis.  Pt currently with functional limitations due to the deficits listed below (see PT Problem List). Pt will benefit from acute skilled PT to increase their independence and safety with mobility to allow discharge.         If plan is discharge home, recommend the following: A little help with walking and/or transfers;A little help with bathing/dressing/bathroom;Assistance with cooking/housework;Assist for transportation;Help with stairs or ramp for entrance   Can travel by private vehicle        Equipment Recommendations BSC/3in1  Recommendations for Other Services       Functional Status Assessment Patient has had a recent decline in their functional status and demonstrates the ability to make significant improvements in function in a reasonable and predictable amount of time.     Precautions / Restrictions Precautions Precautions: Knee Required Braces or Orthoses: Knee Immobilizer - Left Knee Immobilizer - Left: On at all times Restrictions Weight Bearing Restrictions: No LLE Weight Bearing: Weight bearing as tolerated      Mobility  Bed Mobility Overal bed mobility: Needs Assistance Bed Mobility: Supine to Sit     Supine to sit: Min assist     General bed mobility comments: assist to sit upright, using belt around left foot to  support LLE    Transfers Overall transfer level: Needs assistance Equipment used: Rolling walker  (2 wheels) Transfers: Sit to/from Stand Sit to Stand: Contact guard assist           General transfer comment: pt, using belt on the left foot but was able to advance the leg forward    Ambulation/Gait Ambulation/Gait assistance: Contact guard assist Gait Distance (Feet): 20 Feet (then 50) Assistive device: Rolling walker (2 wheels) Gait Pattern/deviations: Step-to pattern Gait velocity: decr     General Gait Details: able to step the  left leg forward  Stairs            Wheelchair Mobility     Tilt Bed    Modified Rankin (Stroke Patients Only)       Balance Overall balance assessment: Mild deficits observed, not formally tested                                           Pertinent Vitals/Pain Pain Assessment Pain Assessment: No/denies pain    Home Living Family/patient expects to be discharged to:: Private residence Living Arrangements: Spouse/significant other Available Help at Discharge: Family;Available 24 hours/day Type of Home: House Home Access: Stairs to enter Entrance Stairs-Rails: None Entrance Stairs-Number of Steps: 1   Home Layout: Two level;1/2 bath on main level Home Equipment: Rollator (4 wheels);Cane - single point Additional Comments: has made bedroom down stairs,    Prior Function Prior Level of Function : Needs assist       Physical Assist : Mobility (physical) Mobility (  physical): Gait   Mobility Comments: using SPC, or rollator ADLs Comments: sponge bath since injury     Extremity/Trunk Assessment   Upper Extremity Assessment Upper Extremity Assessment: Overall WFL for tasks assessed    Lower Extremity Assessment Lower Extremity Assessment: LLE deficits/detail RLE Sensation: WNL RLE Coordination: WNL LLE Deficits / Details: in KI    Cervical / Trunk Assessment Cervical / Trunk Assessment: Normal  Communication      Cognition Arousal: Alert Behavior During Therapy: WFL for tasks  assessed/performed Overall Cognitive Status: Within Functional Limits for tasks assessed                                          General Comments      Exercises     Assessment/Plan    PT Assessment Patient needs continued PT services  PT Problem List Decreased strength;Decreased activity tolerance;Decreased mobility;Decreased safety awareness;Decreased range of motion;Decreased knowledge of precautions       PT Treatment Interventions DME instruction;Stair training;Therapeutic activities;Gait training;Functional mobility training;Therapeutic exercise;Patient/family education    PT Goals (Current goals can be found in the Care Plan section)  Acute Rehab PT Goals Patient Stated Goal: go home PT Goal Formulation: With patient Time For Goal Achievement: 04/05/23 Potential to Achieve Goals: Good    Frequency Min 6X/week     Co-evaluation               AM-PAC PT "6 Clicks" Mobility  Outcome Measure Help needed turning from your back to your side while in a flat bed without using bedrails?: A Little Help needed moving from lying on your back to sitting on the side of a flat bed without using bedrails?: A Little Help needed moving to and from a bed to a chair (including a wheelchair)?: A Little Help needed standing up from a chair using your arms (e.g., wheelchair or bedside chair)?: A Little Help needed to walk in hospital room?: A Little Help needed climbing 3-5 steps with a railing? : A Lot 6 Click Score: 17    End of Session Equipment Utilized During Treatment: Gait belt;Left knee immobilizer Activity Tolerance: Patient tolerated treatment well Patient left: in chair;with call bell/phone within reach Nurse Communication: Mobility status PT Visit Diagnosis: Unsteadiness on feet (R26.81);Difficulty in walking, not elsewhere classified (R26.2);History of falling (Z91.81)    Time: 1610-9604 PT Time Calculation (min) (ACUTE ONLY): 31  min   Charges:   PT Evaluation $PT Eval Low Complexity: 1 Low PT Treatments $Gait Training: 8-22 mins PT General Charges $$ ACUTE PT VISIT: 1 Visit         Blanchard Kelch PT Acute Rehabilitation Services Office [phone] Weekend [phone]   Rada Hay 03/29/2023, 12:25 PM

## 2023-03-29 NOTE — Plan of Care (Signed)
Patient discharging home via private vehicle with daughter. Haydee Salter, RN 03/29/23 1:36 PM

## 2023-03-30 ENCOUNTER — Telehealth: Payer: Self-pay

## 2023-03-30 NOTE — Transitions of Care (Post Inpatient/ED Visit) (Signed)
   03/30/2023  Name: Monica [name] MRN: [id number] DOB: 03-12-1956  Today's TOC FU Call Status: Today's TOC FU Call Status:: Unsuccessful Call (1st Attempt) Unsuccessful Call (1st Attempt) Date: 03/30/23  Attempted to reach the patient regarding the most recent Inpatient/ED visit.  Follow Up Plan: Additional outreach attempts will be made to reach the patient to complete the Transitions of Care (Post Inpatient/ED visit) call.   Signature   Woodfin Ganja LPN [facility] Nurse Health Advisor Direct Dial [phone]

## 2023-04-01 ENCOUNTER — Ambulatory Visit: Payer: Commercial Managed Care - PPO | Admitting: Cardiology

## 2023-04-04 NOTE — Discharge Summary (Signed)
Patient ID: Monica [name] MRN: [id number] DOB/AGE: Jun 29, 1956 67 y.o.  Admit date: 03/28/2023 Discharge date: 03/29/2023  Admission Diagnoses:  Principal Problem:   Left patella fracture   Discharge Diagnoses:  Same  Past Medical History:  Diagnosis Date   Allergic conjunctivitis 03/12/2015   Anxiety    Atrial fibrillation (HCC)    a. s/p DCCV in 12/2012 b. 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   History of AVN   Hypertension    IFG (impaired fasting glucose) 10/14/2013   Itchy eyes 03/12/2015   Morbid obesity (HCC)    Osteoarthritis    Personal history of colonic polyps - large hyperplastic 12/25/2013   Pre-diabetes    Psoriasis    active breakout left buttocks   Right hip pain    Right knee pain 07/06/2016   Skin infection 10/14/2013   Sleep apnea    uses mouth guard only   Swelling of limb 11/30/2012   Tachycardia, unspecified     Surgeries: Procedure(s): OPEN REDUCTION INTERNAL FIXATION (ORIF) PATELLA on 03/28/2023   Consultants:   Discharged Condition: Improved  Hospital Course: Monica [name] is an 67 y.o. female who was admitted 03/28/2023 for operative treatment ofLeft patella fracture. Patient has severe unremitting pain that affects sleep, daily activities, and work/hobbies. After pre-op clearance the patient was taken to the operating room on 03/28/2023 and underwent  Procedure(s): OPEN REDUCTION INTERNAL FIXATION (ORIF) PATELLA.    Patient was given perioperative antibiotics:  Anti-infectives (From admission, onward)    Start     Dose/Rate Route Frequency Ordered Stop   03/28/23 1945  ceFAZolin (ANCEF) IVPB 2g/100 mL  premix        2 g 200 mL/hr over 30 Minutes Intravenous Every 6 hours 03/28/23 1732 03/29/23 0815   03/28/23 1145  ceFAZolin (ANCEF) IVPB 2g/100 mL premix        2 g 200 mL/hr over 30 Minutes Intravenous On call to O.R. 03/28/23 1144 03/28/23 1351        Patient was given sequential compression devices, early ambulation, and chemoprophylaxis to prevent DVT.  Patient benefited maximally from hospital stay and there were no complications.    Recent vital signs: No data found.   Recent laboratory studies: No results for input(s): "WBC", "HGB", "HCT", "PLT", "NA", "K", "CL", "CO2", "BUN", "CREATININE", "GLUCOSE", "INR", "CALCIUM" in the last 72 hours.  Invalid input(s): "PT", "2"   Discharge Medications:   Allergies as of 03/29/2023   No Known Allergies      Medication List     STOP taking these medications    diclofenac sodium 1 % Gel Commonly known as: VOLTAREN   oxyCODONE-acetaminophen 5-325 MG tablet Commonly known as: PERCOCET/ROXICET   Propylene Glycol-Glycerin 1-0.3 % Soln       TAKE these medications    acetaminophen 650 MG CR tablet Commonly known as: TYLENOL Take 650-1,300 mg by mouth every 8 (eight) hours as needed for pain.   calcium carbonate 750 MG chewable tablet Commonly known as: TUMS EX Chew 1-3 tablets by mouth 3 (three) times daily as needed for heartburn.   clobetasol ointment 0.05 % Commonly known as: TEMOVATE Apply  1 application on the skin DAILY Apply daily to thick areas   desonide 0.05 % cream Commonly known as: DESOWEN Apply 1 application topically 2 (two) times daily as needed (for psoriasis).   flecainide 100 MG tablet Commonly known as: TAMBOCOR Take 1 tablet (100 mg total) by mouth 2 (two) times daily.   fluticasone 50 MCG/ACT nasal spray Commonly known as: FLONASE PLACE 2 SPRAYS IN EACH NOSTRIL EVERY DAY   folic acid 1 MG tablet Commonly known as: FOLVITE TAKE 2 TABLETS BY MOUTH DAILY   hydrochlorothiazide 25 MG  tablet Commonly known as: HYDRODIURIL Take 1 tablet (25 mg total) by mouth every morning.   methocarbamol 500 MG tablet Commonly known as: ROBAXIN Take 1 tablet (500 mg total) by mouth every 6 (six) hours as needed for muscle spasms.   methotrexate 2.5 MG tablet Commonly known as: RHEUMATREX Take 6 tablet by mouth once a week What changed:  how much to take how to take this when to take this additional instructions   methotrexate 2.5 MG tablet Commonly known as: RHEUMATREX Take 5 tablets (12.5 mg total) by mouth once a week. What changed: Another medication with the same name was changed. Make sure you understand how and when to take each.   multivitamin with minerals Tabs tablet Take 1 tablet by mouth daily.   nebivolol 10 MG tablet Commonly known as: BYSTOLIC Take 0.5 tablets (5 mg total) by mouth daily.   oxyCODONE 5 MG immediate release tablet Commonly known as: Oxy IR/ROXICODONE Take 1-2 tablets (5-10 mg total) by mouth every 6 (six) hours as needed for severe pain. What changed:  how much to take when to take this reasons to take this   potassium chloride 10 MEQ tablet Commonly known as: KLOR-CON TAKE 2 TABLETS BY MOUTH TWICE DAILY   sertraline 50 MG tablet Commonly known as: ZOLOFT Take 1.5 tablets (75 mg total) by mouth at bedtime.   Skyrizi Pen 150 MG/ML pen Generic drug: risankizumab-rzaa Inject 1 pen injector subcutaneously every four weeks Inject at week 0 and week 4, LOADING DOSE.   Skyrizi Pen 150 MG/ML pen Generic drug: risankizumab-rzaa Inject 1 pen injector subcutaneously every three months Start on week 16, MAINTENANCE DOSE.   Trulicity 3 MG/0.5ML Sopn Generic drug: Dulaglutide Inject 0.5 mLs (3 mg total) into the skin every 7 days.   Vitamin D3 50 MCG (2000 UT) Tabs Take 2,000 Units by mouth every evening.   Xarelto 20 MG Tabs tablet Generic drug: rivaroxaban Take 1 tablet (20 mg total) by mouth daily.               Discharge  Care Instructions  (From admission, onward)           Start     Ordered   03/29/23 0000  Weight bearing as tolerated        03/29/23 0842   03/29/23 0000  Change dressing       Comments: You may remove the bulky bandage (ACE wrap and gauze) two days after surgery. You will have an adhesive waterproof bandage underneath. Leave this in place until your first follow-up appointment.   03/29/23 0842            Diagnostic Studies: DG Foot Complete Right  Result Date: 03/16/2023 CLINICAL DATA:  Fall with great toe pain EXAM: RIGHT FOOT COMPLETE - 3+ VIEW COMPARISON:  None Available. FINDINGS: Acute nondisplaced intra-articular fracture base of first distal phalanx both on medial and lateral side. No subluxation.  Moderate degenerative change at the first MTP joint. Small plantar calcaneal spur IMPRESSION: Acute nondisplaced intra-articular fracture base of first distal phalanx. Electronically Signed   By: Jasmine Pang M.D.   On: 03/16/2023 17:47   DG Knee Complete 4 Views Right  Result Date: 03/16/2023 CLINICAL DATA:  Fall.  Pain. EXAM: RIGHT KNEE - COMPLETE 4+ VIEW COMPARISON:  Right knee radiographs 09/16/2017 FINDINGS: Moderate medial coronoid joint space narrowing and peripheral osteophytosis. Minimal peripheral lateral chronic degenerative osteophytosis. Mild chronic enthesopathic change at the quadriceps and patellar insertions on the patella. There is increased elongation of the superior patellar pole likely from chronic traction changes/osteophytosis. There appears to be a chronic separation/fragmentation of the superior patellar osteophyte measuring up to 1.5 cm. No knee joint effusion.  No acute fracture or dislocation. IMPRESSION: 1. No acute fracture or dislocation. 2. Moderate medial compartment osteoarthritis. 3. Chronic traction changes/fragmented osteophytosis of the superior patellar pole. Electronically Signed   By: Neita Garnet M.D.   On: 03/16/2023 16:05   DG Knee Complete 4  Views Left  Result Date: 03/16/2023 CLINICAL DATA:  Fall.  Left knee pain. EXAM: LEFT KNEE - COMPLETE 4+ VIEW COMPARISON:  None available FINDINGS: There is a transverse fracture of the inferior aspect of the patella. There is a 2.0 x 1.1 cm (craniocaudal by AP) bone fragment anterior to the distal femur, in the expected region of the inferior aspect of the patella. The patellar tendon appears to attach to this bone. There is approximately 1.7 cm superior migration of the dominant more superior aspect of the patella. Mild chronic enthesopathic change at the quadriceps insertion on the patella. Small joint effusion. Mild-to-moderate medial coronoid joint space narrowing and peripheral osteophytosis. There are multiple small cortical fracture fragments overlying the patellar fracture. IMPRESSION: 1. Transverse fracture of the inferior aspect of the patella. There is approximately 1.7 cm superior migration of the dominant more superior aspect of the patella. 2. Small joint effusion. 3. Mild-to-moderate medial compartment osteoarthritis. Electronically Signed   By: Neita Garnet M.D.   On: 03/16/2023 16:01    Disposition: Discharge disposition: 01-Home or Self Care       Discharge Instructions     Call MD / Call 911   Complete by: As directed    If you experience chest pain or shortness of breath, CALL 911 and be transported to the hospital emergency room.  If you develope a fever above 101 F, pus (white drainage) or increased drainage or redness at the wound, or calf pain, call your surgeon's office.   Change dressing   Complete by: As directed    You may remove the bulky bandage (ACE wrap and gauze) two days after surgery. You will have an adhesive waterproof bandage underneath. Leave this in place until your first follow-up appointment.   Constipation Prevention   Complete by: As directed    Drink plenty of fluids.  Prune juice may be helpful.  You may use a stool softener, such as Colace (over  the counter) 100 mg twice a day.  Use MiraLax (over the counter) for constipation as needed.   Diet - low sodium heart healthy   Complete by: As directed    Do not put a pillow under the knee. Place it under the heel.   Complete by: As directed    Driving restrictions   Complete by: As directed    No driving for two weeks   Post-operative opioid taper instructions:   Complete by: As directed  POST-OPERATIVE OPIOID TAPER INSTRUCTIONS: It is important to wean off of your opioid medication as soon as possible. If you do not need pain medication after your surgery it is ok to stop day one. Opioids include: Codeine, Hydrocodone(Norco, Vicodin), Oxycodone(Percocet, oxycontin) and hydromorphone amongst others.  Long term and even short term use of opiods can cause: Increased pain response Dependence Constipation Depression Respiratory depression And more.  Withdrawal symptoms can include Flu like symptoms Nausea, vomiting And more Techniques to manage these symptoms Hydrate well Eat regular healthy meals Stay active Use relaxation techniques(deep breathing, meditating, yoga) Do Not substitute Alcohol to help with tapering If you have been on opioids for less than two weeks and do not have pain than it is ok to stop all together.  Plan to wean off of opioids This plan should start within one week post op of your joint replacement. Maintain the same interval or time between taking each dose and first decrease the dose.  Cut the total daily intake of opioids by one tablet each day Next start to increase the time between doses. The last dose that should be eliminated is the evening dose.      TED hose   Complete by: As directed    Use stockings (TED hose) for three weeks on both leg(s).  You may remove them at night for sleeping.   Weight bearing as tolerated   Complete by: As directed         Follow-up Information     Aluisio, Homero Fellers, MD Follow up in 2 week(s).   Specialty:  Orthopedic Surgery Contact information: [address] [place] [place] [address] [phone]                  Signed: Arther Abbott 04/04/2023, 11:34 AM

## 2023-04-07 ENCOUNTER — Other Ambulatory Visit (HOSPITAL_BASED_OUTPATIENT_CLINIC_OR_DEPARTMENT_OTHER): Payer: Self-pay | Admitting: Cardiology

## 2023-04-07 ENCOUNTER — Other Ambulatory Visit (HOSPITAL_BASED_OUTPATIENT_CLINIC_OR_DEPARTMENT_OTHER): Payer: Self-pay

## 2023-04-07 DIAGNOSIS — I4819 Other persistent atrial fibrillation: Secondary | ICD-10-CM

## 2023-04-07 MED ORDER — FLECAINIDE ACETATE 100 MG PO TABS
100.0000 mg | ORAL_TABLET | Freq: Two times a day (BID) | ORAL | 0 refills | Status: DC
Start: 2023-04-07 — End: 2023-04-11
  Filled 2023-04-07: qty 180, 90d supply, fill #0

## 2023-04-07 NOTE — Telephone Encounter (Signed)
Rx(s) sent to pharmacy electronically. Appointment 04/11/23

## 2023-04-08 ENCOUNTER — Other Ambulatory Visit (HOSPITAL_BASED_OUTPATIENT_CLINIC_OR_DEPARTMENT_OTHER): Payer: Self-pay

## 2023-04-08 MED ORDER — FOLIC ACID 1 MG PO TABS
2.0000 mg | ORAL_TABLET | Freq: Every day | ORAL | 3 refills | Status: DC
  Filled 2023-04-08: qty 60, 30d supply, fill #0

## 2023-04-10 NOTE — Progress Notes (Unsigned)
Cardiology Clinic Note   Patient Name: Monica [name] Date of Encounter: 04/11/2023  Primary Care Provider:  Sharlene Dory, DO Primary Cardiologist:  Olga Millers, MD  Patient Profile    67 year old female with a history of persistent atrial fibrillation, hypertension, lower extremity edema, and obesit. At her follow-up visit in April 2023 she reported increased fatigue.  She remained in atrial fibrillation. Flecainide was increased to 100 mg bid. She underwent repeat DCCV on 12/22/2021 with restoration of NSR. ETT was recommended in the setting of increased flecainide dosing. Unfortunately, when she presented for GXT for flecainide monitoring on 01/12/2022 she was noted to be back in atrial fibrillation. GXT was cancelled.  However on follow-up visit on 10/25/2022 she was maintaining normal sinus rhythm.  She is on Xarelto for CHA2DS2-VASc score of 3 (Gender, hypertension).   Past Medical History    Past Medical History:  Diagnosis Date   Allergic conjunctivitis 03/12/2015   Anxiety    Atrial fibrillation (HCC)    a. s/p DCCV in 12/2012 b. 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   History of AVN   Hypertension    IFG (impaired fasting glucose) 10/14/2013   Itchy eyes 03/12/2015   Morbid obesity (HCC)    Osteoarthritis    Personal history of colonic polyps - large hyperplastic 12/25/2013   Pre-diabetes    Psoriasis    active breakout left buttocks   Right hip pain    Right knee pain 07/06/2016   Skin infection 10/14/2013   Sleep apnea    uses mouth guard only   Swelling of limb 11/30/2012   Tachycardia, unspecified    Past Surgical  History:  Procedure Laterality Date   CARDIOVERSION N/A 01/09/2013   Procedure: CARDIOVERSION;  Surgeon: Lewayne Bunting, MD;  Location: [facility] ENDOSCOPY;  Service: Cardiovascular;  Laterality: N/A;   CARDIOVERSION N/A 10/07/2014   Procedure: CARDIOVERSION;  Surgeon: Lewayne Bunting, MD;  Location: [facility] ENDOSCOPY;  Service: Cardiovascular;  Laterality: N/A;  09:00 Lido 60mg ,IV followed by Propofol  70mg /IV    synched electrocardioversion at 120 joules for Afib,repeated at 200 joules, 70 mg...successfully changed to SR   CARDIOVERSION N/A 12/22/2021   Procedure: CARDIOVERSION;  Surgeon: Little Ishikawa, MD;  Location: [facility] ENDOSCOPY;  Service: Cardiovascular;  Laterality: N/A;   FRACTURE SURGERY  07/21/12   right hip arthroplasty   JOINT REPLACEMENT     Left hip Dr. Magnus Ivan 04-07-18   ORIF PATELLA Left 03/28/2023   Procedure: OPEN REDUCTION INTERNAL FIXATION (ORIF) PATELLA;  Surgeon: Ollen Gross, MD;  Location: [facility] ORS;  Service: Orthopedics;  Laterality: Left;   TOTAL HIP ARTHROPLASTY  07/21/2012   Procedure: TOTAL HIP ARTHROPLASTY;  Surgeon: Loanne Drilling, MD;  Location: [facility] ORS;  Service: Orthopedics;  Laterality: Right;   TOTAL HIP ARTHROPLASTY Left 04/07/2018   Procedure: LEFT TOTAL HIP ARTHROPLASTY ANTERIOR APPROACH;  Surgeon: Kathryne Hitch, MD;  Location: [facility] ORS;  Service: Orthopedics;  Laterality: Left;  Needs RNFA    Allergies  No Known Allergies  History of Present Illness    [name] presents today for ongoing assessment and management of persistent atrial fibrillation, hypertension,  chronic lower extremity edema, with history of obesity.  Since being seen last the patient has fallen at the beginning of August 2024 and fractured her patella while getting out of her car at [employer] parking, the following day her son who is a paraplegic died of longstanding complications with his health.  She is continuing to grieve over this loss and recover from patella  fracture.  She has a follow-up appointment with Dr.Alusio at [facility] after our office visit.  She denies any bleeding, hemoptysis for epistaxis .  She uses cane for ambulation.   Home Medications    Current Outpatient Medications  Medication Sig Dispense Refill   acetaminophen (TYLENOL) 650 MG CR tablet Take 650-1,300 mg by mouth every 8 (eight) hours as needed for pain.     calcium carbonate (TUMS EX) 750 MG chewable tablet Chew 1-3 tablets by mouth 3 (three) times daily as needed for heartburn.     Cholecalciferol (VITAMIN D3) 2000 units TABS Take 2,000 Units by mouth every evening.     clobetasol ointment (TEMOVATE) 0.05 % Apply 1 application on the skin DAILY Apply daily to thick areas 60 g 0   desonide (DESOWEN) 0.05 % cream Apply 1 application topically 2 (two) times daily as needed (for psoriasis).   0   Dulaglutide (TRULICITY) 3 MG/0.5ML SOPN Inject 0.5 mLs (3 mg total) into the skin every 7 days. 6 mL 3   fluticasone (FLONASE) 50 MCG/ACT nasal spray PLACE 2 SPRAYS IN EACH NOSTRIL EVERY DAY 16 g 3   folic acid (FOLVITE) 1 MG tablet Take 2 tablets (2 mg total) by mouth daily. 60 tablet 3   hydrochlorothiazide (HYDRODIURIL) 25 MG tablet Take 1 tablet (25 mg total) by mouth every morning. 90 tablet 3   methocarbamol (ROBAXIN) 500 MG tablet Take 1 tablet (500 mg total) by mouth every 6 (six) hours as needed for muscle spasms. 40 tablet 0   methotrexate (RHEUMATREX) 2.5 MG tablet Take 5 tablets (12.5 mg total) by mouth once a week. 20 tablet 1   Multiple Vitamin (MULTIVITAMIN WITH MINERALS) TABS tablet Take 1 tablet by mouth daily.     oxyCODONE (OXY IR/ROXICODONE) 5 MG immediate release tablet Take 1-2 tablets (5-10 mg total) by mouth every 6 (six) hours as needed for severe pain. 30 tablet 0   potassium chloride (KLOR-CON) 10 MEQ tablet TAKE 2 TABLETS BY MOUTH TWICE DAILY 360 tablet 3   sertraline (ZOLOFT) 50 MG tablet Take 1.5 tablets (75 mg total) by mouth at bedtime. 135 tablet 3    flecainide (TAMBOCOR) 100 MG tablet Take 1 tablet (100 mg total) by mouth 2 (two) times daily. 180 tablet 0   methotrexate (RHEUMATREX) 2.5 MG tablet Take 6 tablet by mouth once a week (Patient not taking: Reported on 04/11/2023) 24 tablet 5   nebivolol (BYSTOLIC) 10 MG tablet Take 0.5 tablets (5 mg total) by mouth daily. 45 tablet 3   risankizumab-rzaa (SKYRIZI PEN) 150 MG/ML pen Inject 1 pen injector subcutaneously every four weeks Inject at week 0 and week 4, LOADING DOSE. 2 mL 0   Risankizumab-rzaa (SKYRIZI PEN) 150 MG/ML SOAJ Inject 1 pen injector subcutaneously every three months Start on week 16, MAINTENANCE DOSE. 1 mL 3   rivaroxaban (XARELTO) 20 MG TABS tablet Take 1 tablet (20 mg total) by mouth daily. 90 tablet 3   No current facility-administered medications for this visit.     Family History    Family History  Problem Relation Age of  Onset   Hypertension Mother    Diabetes Mother    Alzheimer's disease Mother    Thyroid disease Mother    Diabetes Son    She indicated that her mother is deceased. She indicated that her father is deceased. She indicated that her sister is alive. She indicated that her brother is alive. She indicated that the status of her son is unknown.  Social History    Social History   Socioeconomic History   Marital status: Married    Spouse name: [name]   Number of children: 1   Years of education: 12   Highest education level: Not on file  Occupational History   Occupation: ED ADMISSIONS     Employer: [employer]    Comment: [employer]   Tobacco Use   Smoking status: Former    Current packs/day: 0.00    Types: Cigarettes    Quit date: 09/13/1982    Years since quitting: 40.6   Smokeless tobacco: Never  Vaping Use   Vaping status: Never Used  Substance and Sexual Activity   Alcohol use: Not Currently    Comment: Occasionally   Drug use: No   Sexual activity: Yes    Partners: Male  Other Topics Concern   Not on file  Social  History Narrative   Marital Status: Married ([name])    Children:  Son Metallurgist)    Pets: None   Living Situation: Lives with Jonny Ruiz   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    EducationEngineer, petroleum    Tobacco Use/Exposure: Former Smoker.  She used to smoke 4 cigarettes per day for five years and quit 32 years ago.    Alcohol Use:  Occasional   Drug Use:  None   Diet:  Regular   Exercise:  Water Aerobics (2 x per week)     Hobbies: Shopping, Cycling             Social Determinants of Health   Financial Resource Strain: Low Risk  (03/30/2018)   Overall Financial Resource Strain (CARDIA)    Difficulty of Paying Living Expenses: Not hard at all  Food Insecurity: No Food Insecurity (03/28/2023)   Hunger Vital Sign    Worried About Running Out of Food in the Last Year: Never true    Ran Out of Food in the Last Year: Never true  Transportation Needs: No Transportation Needs (03/28/2023)   PRAPARE - Administrator, Civil Service (Medical): No    Lack of Transportation (Non-Medical): No  Physical Activity: Unknown (03/30/2018)   Exercise Vital Sign    Days of Exercise per Week: Patient declined    Minutes of Exercise per Session: Patient declined  Stress: Not on file  Social Connections: Unknown (03/30/2018)   Social Connection and Isolation Panel [NHANES]    Frequency of Communication with Friends and Family: Patient declined    Frequency of Social Gatherings with Friends and Family: Patient declined    Attends Religious Services: Patient declined    Database administrator or Organizations: Patient declined    Attends Banker Meetings: Patient declined    Marital Status: Patient declined  Intimate Partner Violence: Not At Risk (03/28/2023)   Humiliation, Afraid, Rape, and Kick questionnaire    Fear of Current or Ex-Partner: No    Emotionally Abused: No    Physically Abused: No    Sexually Abused: No      Review  of Systems    General:  No chills, fever, night sweats or weight changes.  Cardiovascular:  No chest pain, dyspnea on exertion, edema, orthopnea, palpitations, paroxysmal nocturnal dyspnea. Dermatological: No rash, lesions/masses Respiratory: No cough, dyspnea Urologic: No hematuria, dysuria Abdominal:   No nausea, vomiting, diarrhea, bright red blood per rectum, melena, or hematemesis Neurologic:  No visual changes, wkns, changes in mental status. All other systems reviewed and are otherwise negative except as noted above.       Physical Exam    VS:  BP 104/72   Pulse 60   Ht 5' 5.5" (1.664 m)   Wt 247 lb 3.2 oz (112.1 kg)   LMP 06/19/2013   SpO2 95%   BMI 40.51 kg/m  , BMI Body mass index is 40.51 kg/m.     GEN: Well nourished, well developed, in no acute distress. HEENT: normal. Neck: Supple, no JVD, carotid bruits, or masses. Cardiac: IRRR, 2/6 soft systolic murmurs, no rubs, or gallops. No clubbing, cyanosis, edema.  Radials/DP/PT 2+ and equal bilaterally.  Respiratory:  Respirations regular and unlabored, clear to auscultation bilaterally. GI: Soft, nontender, nondistended, BS + x 4. MS: no deformity or atrophy.  Left leg knee area is held with the brace in a straight unable to bend currently, no significant edema bilaterally Skin: warm and dry, no rash. Neuro:  Strength and sensation are intact. Psych: Normal affect.      Lab Results  Component Value Date   WBC 8.0 03/29/2023   HGB 10.3 (L) 03/29/2023   HCT 32.1 (L) 03/29/2023   MCV 94.7 03/29/2023   PLT 269 03/29/2023   Lab Results  Component Value Date   CREATININE 0.68 03/29/2023   BUN 11 03/29/2023   NA 137 03/29/2023   K 3.6 03/29/2023   CL 102 03/29/2023   CO2 25 03/29/2023   Lab Results  Component Value Date   ALT 23 03/26/2022   AST 18 03/26/2022   ALKPHOS 137 (H) 03/26/2022   BILITOT 0.5 03/26/2022   Lab Results  Component Value Date   CHOL 146 03/26/2022   HDL 48 03/26/2022    LDLCALC 82 03/26/2022   LDLDIRECT 84 03/26/2022   TRIG 86 03/26/2022   CHOLHDL 2.9 09/10/2021    Lab Results  Component Value Date   HGBA1C 5.2 03/28/2023     Review of Prior Studies    GXT 10/20/2022  The patient exercised according to the BRUCE protocol for 4:49min achieving 5.8 METs   Target HR was not achieved (max HR 103bpm; 66% MPHR)   Baseline ECG with sinus bradycardia with episodes of AVB and baseline T-wave inversions   Study nondiagnostic due to resting T-wave inversions on baseline ECG abnormalities and target HR not achieved.   No exercise induced arrhythmias or QRS widening noted during stress testing   Echocardiogram 09/18/2021 1. Left ventricular ejection fraction, by estimation, is 55 to 60%. The  left ventricle has normal function. The left ventricle has no regional  wall motion abnormalities. Left ventricular diastolic function could not  be evaluated.   2. Right ventricular systolic function is normal. The right ventricular  size is mildly enlarged. There is normal pulmonary artery systolic  pressure.   3. Left atrial size was severely dilated.   4. Right atrial size was moderately dilated.   5. The mitral valve is grossly normal. Mild mitral valve regurgitation.   6. Tricuspid valve regurgitation is mild to moderate.   7. The aortic valve is grossly normal. Aortic  valve regurgitation is not  visualized. No aortic stenosis is present.   8. The inferior vena cava is normal in size with greater than 50%  respiratory variability, suggesting right atrial pressure of 3 mmHg.   Assessment & Plan   1.  Paroxysmal atrial fibrillation: He reveals sinus rhythm with frequent PVCs.  She has not yet taken her medications today for heart rhythm control.  Continue Xarelto and flecainide as directed.  She is given refills for same.  2.  Hypertension: Remains on HCTZ and Bystolic.  Tolerating this well.  No dizziness or lightheadedness or evidence of hypotension.  Refills  are provided for Bystolic.  3.  Chronic lower extremity edema: No significant lower extremity edema seen currently.  No changes in her medication regimen at this time.         Signed, Bettey Mare. Liborio Nixon, ANP, AACC   04/11/2023 4:37 PM      Office [phone] Fax [phone]  Notice: This dictation was prepared with Dragon dictation along with smaller phrase technology. Any transcriptional errors that result from this process are unintentional and may not be corrected upon review.

## 2023-04-11 ENCOUNTER — Other Ambulatory Visit (HOSPITAL_BASED_OUTPATIENT_CLINIC_OR_DEPARTMENT_OTHER): Payer: Self-pay

## 2023-04-11 ENCOUNTER — Other Ambulatory Visit: Payer: Self-pay

## 2023-04-11 ENCOUNTER — Ambulatory Visit: Payer: Commercial Managed Care - HMO | Attending: Cardiology | Admitting: Adult Health

## 2023-04-11 ENCOUNTER — Encounter: Payer: Self-pay | Admitting: Adult Health

## 2023-04-11 VITALS — BP 104/72 | HR 60 | Ht 65.5 in | Wt 247.2 lb

## 2023-04-11 DIAGNOSIS — E1159 Type 2 diabetes mellitus with other circulatory complications: Secondary | ICD-10-CM | POA: Diagnosis not present

## 2023-04-11 DIAGNOSIS — I480 Paroxysmal atrial fibrillation: Secondary | ICD-10-CM | POA: Diagnosis not present

## 2023-04-11 DIAGNOSIS — I152 Hypertension secondary to endocrine disorders: Secondary | ICD-10-CM

## 2023-04-11 DIAGNOSIS — I4819 Other persistent atrial fibrillation: Secondary | ICD-10-CM | POA: Diagnosis not present

## 2023-04-11 MED ORDER — FLECAINIDE ACETATE 100 MG PO TABS
100.0000 mg | ORAL_TABLET | Freq: Two times a day (BID) | ORAL | 0 refills | Status: DC
Start: 2023-04-11 — End: 2023-08-19
  Filled 2023-04-11: qty 180, 90d supply, fill #0

## 2023-04-11 MED ORDER — OXYCODONE HCL 5 MG PO TABS
5.0000 mg | ORAL_TABLET | Freq: Two times a day (BID) | ORAL | 0 refills | Status: DC | PRN
  Filled 2023-04-11: qty 20, 10d supply, fill #0

## 2023-04-11 MED ORDER — NEBIVOLOL HCL 10 MG PO TABS
5.0000 mg | ORAL_TABLET | Freq: Every day | ORAL | 3 refills | Status: DC
Start: 2023-04-11 — End: 2024-04-26
  Filled 2023-04-11: qty 45, 90d supply, fill #0
  Filled 2023-11-07 (×2): qty 45, 90d supply, fill #1
  Filled 2024-01-30: qty 45, 90d supply, fill #2
  Filled 2024-01-30: qty 45, 90d supply, fill #0

## 2023-04-11 MED ORDER — RIVAROXABAN 20 MG PO TABS
20.0000 mg | ORAL_TABLET | Freq: Every day | ORAL | 3 refills | Status: DC
Start: 2023-04-11 — End: 2024-04-26
  Filled 2023-04-11: qty 90, 90d supply, fill #0
  Filled 2023-08-03: qty 90, 90d supply, fill #1
  Filled 2023-10-28: qty 90, 90d supply, fill #2
  Filled 2024-01-30: qty 90, 90d supply, fill #3

## 2023-04-11 NOTE — Patient Instructions (Signed)
Medication Instructions:  No changes *If you need a refill on your cardiac medications before your next appointment, please call your pharmacy*   Lab Work: No Labs If you have labs (blood work) drawn today and your tests are completely normal, you will receive your results only by: MyChart Message (if you have MyChart) OR A paper copy in the mail If you have any lab test that is abnormal or we need to change your treatment, we will call you to review the results.   Testing/Procedures: No Testing   Follow-Up: At [facility], you and your health needs are our priority.  As part of our continuing mission to provide you with exceptional heart care, we have created designated Provider Care Teams.  These Care Teams include your primary Cardiologist (physician) and Advanced Practice Providers (APPs -  Physician Assistants and Nurse Practitioners) who all work together to provide you with the care you need, when you need it.  We recommend signing up for the patient portal called "MyChart".  Sign up information is provided on this After Visit Summary.  MyChart is used to connect with patients for Virtual Visits (Telemedicine).  Patients are able to view lab/test results, encounter notes, upcoming appointments, etc.  Non-urgent messages can be sent to your provider as well.   To learn more about what you can do with MyChart, go to [email or web address].    Your next appointment:   6 month(s)  Provider:   Olga Millers, MD

## 2023-04-12 ENCOUNTER — Other Ambulatory Visit (HOSPITAL_BASED_OUTPATIENT_CLINIC_OR_DEPARTMENT_OTHER): Payer: Self-pay

## 2023-04-12 ENCOUNTER — Other Ambulatory Visit (HOSPITAL_COMMUNITY): Payer: Self-pay

## 2023-04-13 ENCOUNTER — Other Ambulatory Visit (HOSPITAL_BASED_OUTPATIENT_CLINIC_OR_DEPARTMENT_OTHER): Payer: Self-pay

## 2023-04-13 ENCOUNTER — Other Ambulatory Visit: Payer: Self-pay

## 2023-04-14 ENCOUNTER — Other Ambulatory Visit: Payer: Self-pay

## 2023-04-14 ENCOUNTER — Other Ambulatory Visit (HOSPITAL_COMMUNITY): Payer: Self-pay

## 2023-04-15 ENCOUNTER — Other Ambulatory Visit: Payer: Self-pay

## 2023-04-15 ENCOUNTER — Other Ambulatory Visit (HOSPITAL_COMMUNITY): Payer: Self-pay

## 2023-04-26 ENCOUNTER — Other Ambulatory Visit (HOSPITAL_BASED_OUTPATIENT_CLINIC_OR_DEPARTMENT_OTHER): Payer: Self-pay

## 2023-05-07 ENCOUNTER — Encounter (HOSPITAL_COMMUNITY): Payer: Self-pay

## 2023-05-31 ENCOUNTER — Ambulatory Visit: Payer: PPO | Attending: Student

## 2023-05-31 ENCOUNTER — Other Ambulatory Visit: Payer: Self-pay

## 2023-05-31 DIAGNOSIS — M6281 Muscle weakness (generalized): Secondary | ICD-10-CM | POA: Diagnosis not present

## 2023-05-31 DIAGNOSIS — M25662 Stiffness of left knee, not elsewhere classified: Secondary | ICD-10-CM | POA: Diagnosis not present

## 2023-05-31 DIAGNOSIS — R2689 Other abnormalities of gait and mobility: Secondary | ICD-10-CM | POA: Diagnosis not present

## 2023-05-31 NOTE — Therapy (Unsigned)
OUTPATIENT PHYSICAL THERAPY LOWER EXTREMITY EVALUATION   Patient Name: Monica [name] MRN: [id number] DOB:19-Apr-1956, 67 y.o., female Today's Date: 06/01/2023  END OF SESSION:  PT End of Session - 06/01/23 0908     Visit Number 1    Number of Visits 12    Date for PT Re-Evaluation 07/31/23    Authorization Type HTA    PT Start Time 1745    PT Stop Time 1830    PT Time Calculation (min) 45 min    Activity Tolerance Patient tolerated treatment well    Behavior During Therapy [facility] for tasks assessed/performed             Past Medical History:  Diagnosis Date   Allergic conjunctivitis 03/12/2015   Anxiety    Atrial fibrillation (HCC)    a. s/p DCCV in 12/2012 b. 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   History of AVN   Hypertension    IFG (impaired fasting glucose) 10/14/2013   Itchy eyes 03/12/2015   Morbid obesity (HCC)    Osteoarthritis    Personal history of colonic polyps - large hyperplastic 12/25/2013   Pre-diabetes    Psoriasis    active breakout left buttocks   Right hip pain    Right knee pain 07/06/2016   Skin infection 10/14/2013   Sleep apnea    uses mouth guard only   Swelling of limb 11/30/2012   Tachycardia, unspecified    Past Surgical History:  Procedure Laterality Date   CARDIOVERSION N/A 01/09/2013   Procedure: CARDIOVERSION;  Surgeon: Lewayne Bunting, MD;  Location: [facility] ENDOSCOPY;  Service: Cardiovascular;  Laterality: N/A;   CARDIOVERSION N/A 10/07/2014   Procedure: CARDIOVERSION;  Surgeon: Lewayne Bunting, MD;  Location: [facility] ENDOSCOPY;  Service: Cardiovascular;  Laterality: N/A;  09:00 Lido 60mg ,IV followed by  Propofol  70mg /IV    synched electrocardioversion at 120 joules for Afib,repeated at 200 joules, 70 mg...successfully changed to SR   CARDIOVERSION N/A 12/22/2021   Procedure: CARDIOVERSION;  Surgeon: Little Ishikawa, MD;  Location: [facility] ENDOSCOPY;  Service: Cardiovascular;  Laterality: N/A;   FRACTURE SURGERY  07/21/12   right hip arthroplasty   JOINT REPLACEMENT     Left hip Dr. Magnus Ivan 04-07-18   ORIF PATELLA Left 03/28/2023   Procedure: OPEN REDUCTION INTERNAL FIXATION (ORIF) PATELLA;  Surgeon: Ollen Gross, MD;  Location: [facility] ORS;  Service: Orthopedics;  Laterality: Left;   TOTAL HIP ARTHROPLASTY  07/21/2012   Procedure: TOTAL HIP ARTHROPLASTY;  Surgeon: Loanne Drilling, MD;  Location: [facility] ORS;  Service: Orthopedics;  Laterality: Right;   TOTAL HIP ARTHROPLASTY Left 04/07/2018   Procedure: LEFT TOTAL HIP ARTHROPLASTY ANTERIOR APPROACH;  Surgeon: Kathryne Hitch, MD;  Location: [facility] ORS;  Service: Orthopedics;  Laterality: Left;  Needs RNFA   Patient Active Problem List   Diagnosis Date Noted   Left patella fracture 03/28/2023   Encounter for annual physical exam 04/04/2022   Hypercoagulable state due to longstanding persistent atrial fibrillation (HCC) 04/04/2022   Depression 09/10/2021   Vitamin D deficiency 09/10/2021   DM2 (diabetes mellitus, type 2) (HCC) 09/10/2021   Status post left hip replacement 04/07/2018  Unilateral primary osteoarthritis, left hip 02/23/2018   Psoriatic arthropathy (HCC) 11/17/2017   Tinnitus 11/19/2016   Left hip pain 10/21/2015   Gastroesophageal reflux disease 09/19/2015   OSA (obstructive sleep apnea) 02/20/2015   Morbid obesity (HCC) 01/15/2015   Lymphedema 06/19/2014   History of colonic polyps 12/25/2013   Carpal tunnel syndrome 10/14/2013   Allergic rhinitis 06/20/2013   Edema 12/20/2012   Avascular necrosis of hip (HCC) 07/21/2012   Atrial fibrillation (HCC) 07/06/2012   Hypertension associated with diabetes (HCC) 09/14/2011    Psoriasis 08/16/2005    PCP: Sharlene Dory, DO   REFERRING PROVIDER: Eartha Inch, PA   REFERRING DIAG: Z51.89 (ICD-10-CM) - Encounter for other specified aftercare  THERAPY DIAG:  Other abnormalities of gait and mobility  Muscle weakness (generalized)  Decreased range of motion (ROM) of left knee  Rationale for Evaluation and Treatment: Rehabilitation  ONSET DATE: 03/16/23, surgery 03/28/23  SUBJECTIVE:   SUBJECTIVE STATEMENT: L patellar fracture on 03/16/23, surgery 03/28/23  PERTINENT HISTORY: None available PAIN:  Are you having pain? Yes: NPRS scale: (soreness vs pain) 4/10 Pain location: L knee Pain description: ache, stiffness Aggravating factors: movement Relieving factors: rest  PRECAUTIONS: None  RED FLAGS: None   WEIGHT BEARING RESTRICTIONS: No  FALLS:  Has patient fallen in last 6 months? Yes. Number of falls 1  OCCUPATION: retired  PLOF: Independent  PATIENT GOALS: to walk and go up/down stairs again  NEXT MD VISIT: 11/24  OBJECTIVE:  Note: Objective measures were completed at Evaluation unless otherwise noted.  DIAGNOSTIC FINDINGS: IMPRESSION: 1. Transverse fracture of the inferior aspect of the patella. There is approximately 1.7 cm superior migration of the dominant more superior aspect of the patella. 2. Small joint effusion. 3. Mild-to-moderate medial compartment osteoarthritis.     Electronically Signed   By: Neita Garnet M.D.   On: 03/16/2023 16:01  PATIENT SURVEYS:  FOTO 38(63 predicted)  MUSCLE LENGTH: Hamstrings: not tested   POSTURE: No Significant postural limitations  LOWER EXTREMITY ROM:  A/PROM Right eval Left eval  Hip flexion  90d  Hip extension    Hip abduction    Hip adduction    Hip internal rotation    Hip external rotation    Knee flexion  50/60d  Knee extension  -2d  Ankle dorsiflexion  5d  Ankle plantarflexion    Ankle inversion    Ankle eversion     (Blank rows = not  tested)  LOWER EXTREMITY MMT:  MMT Right eval Left eval  Hip flexion  3+  Hip extension  3+  Hip abduction  3+  Hip adduction    Hip internal rotation    Hip external rotation    Knee flexion  3-  Knee extension  3-  Ankle dorsiflexion    Ankle plantarflexion  3+  Ankle inversion    Ankle eversion     (Blank rows = not tested)  LOWER EXTREMITY SPECIAL TESTS:  deferred  FUNCTIONAL TESTS:  30 seconds chair stand test 1(limited due to apprehension)  GAIT: Distance walked: 26ft x2 Assistive device utilized: Single point cane Level of assistance: Complete Independence Comments: antalgic gait    TODAY'S TREATMENT:  DATE: 05/31/23 Eval and HEP   PATIENT EDUCATION:  Education details: Discussed eval findings, rehab rationale and POC and patient is in agreement  Person educated: Patient Education method: Explanation Education comprehension: verbalized understanding and needs further education  HOME EXERCISE PROGRAM: Access Code: [email or web address] URL: [email or web address] Date: 05/31/2023 Prepared by: Gustavus Bryant  Exercises - Supine Quad Set  - 5 x daily - 5 x weekly - 1 sets - 10 reps - 3s hold - Supine Heel Slide with Strap  - 5 x daily - 5 x weekly - 1 sets - 10 reps - Seated Heel Slide  - 5 x daily - 5 x weekly - 1 sets - 10 reps - Seated Long Arc Quad  - 5 x daily - 5 x weekly - 1 sets - 10 reps  ASSESSMENT:  CLINICAL IMPRESSION: Patient is a 67 y.o. female who was seen today for physical therapy evaluation and treatment for L patellar fracture and ORIF resulting in pain, weakness, loss of ROM and decreased functional mobility. Patient able to generate a fair quad set and perform heel slides w/o exacerbation of symptoms.  Extension ROM is good but flexion limited to 60d PROM due to stiffness and apprehension. Patient fearful of  activity due to possibility of injury.  OBJECTIVE IMPAIRMENTS: Abnormal gait, decreased activity tolerance, decreased balance, decreased endurance, decreased knowledge of condition, decreased mobility, difficulty walking, decreased ROM, decreased strength, obesity, and pain.   ACTIVITY LIMITATIONS: carrying, lifting, bending, sitting, standing, squatting, stairs, and locomotion level  PERSONAL FACTORS: Age, Fitness, and 1 comorbidity: DM  are also affecting patient's functional outcome.   REHAB POTENTIAL: Good  CLINICAL DECISION MAKING: Evolving/moderate complexity  EVALUATION COMPLEXITY: Low   GOALS: Goals reviewed with patient? No  SHORT TERM GOALS: Target date: 06/21/2023   Patient to demonstrate independence in HEP  Baseline: [id number] Goal status: INITIAL  2.  25ft ambulation with LRAD Baseline: 72ft with cane Goal status: INITIAL  3.  90d PROM L knee flexion Baseline: 60d Goal status: INITIAL   LONG TERM GOALS: Target date: 07/12/2023    Patient will score at least 63% on FOTO to signify clinically meaningful improvement in functional abilities.   Baseline: 38% Goal status: INITIAL  2.  Able to negotiate 16 steps with most appropriate pattern Baseline: TBD Goal status: INITIAL  3.  110d L knee flexion Baseline: 60d Goal status: INITIAL  4.  Increase LLE strength to 4/5 Baseline:  MMT Right eval Left eval  Hip flexion  3+  Hip extension  3+  Hip abduction  3+  Hip adduction    Hip internal rotation    Hip external rotation    Knee flexion  3-  Knee extension  3-  Ankle dorsiflexion    Ankle plantarflexion  3+   Goal status: INITIAL  5.  Decrease level of discomfort/soreness to 2/10 Baseline: 4/10 Goal status: INITIAL   PLAN:  PT FREQUENCY: 2x/week  PT DURATION: 6 weeks  PLANNED INTERVENTIONS: 97164- PT Re-evaluation, 97110-Therapeutic exercises, 97530- Therapeutic activity, 97112- Neuromuscular re-education, 97535- Self Care, 13086-  Manual therapy, [id number]- Gait training, [phone]- Aquatic Therapy, 97014- Electrical stimulation (unattended), Stair training, Dry Needling, Joint mobilization, DME instructions, Cryotherapy, and Moist heat  PLAN FOR NEXT SESSION: HEP review and update, manual techniques as appropriate, aerobic tasks, ROM and flexibility activities, strengthening and PREs, TPDN, gait and balance training as needed     Hildred Laser, PT 06/01/2023, 9:17 AM

## 2023-06-01 ENCOUNTER — Other Ambulatory Visit: Payer: Self-pay

## 2023-06-01 ENCOUNTER — Other Ambulatory Visit (HOSPITAL_BASED_OUTPATIENT_CLINIC_OR_DEPARTMENT_OTHER): Payer: Self-pay

## 2023-06-07 NOTE — Therapy (Unsigned)
OUTPATIENT PHYSICAL THERAPY LOWER EXTREMITY EVALUATION   Patient Name: Monica [name] MRN: [id number] DOB:1955/09/12, 67 y.o., female Today's Date: 06/08/2023  END OF SESSION:  PT End of Session - 06/08/23 1458     Visit Number 2    Number of Visits 12    Date for PT Re-Evaluation 07/31/23    Authorization Type HTA    PT Start Time 1455    PT Stop Time 1525    PT Time Calculation (min) 30 min    Activity Tolerance Patient tolerated treatment well    Behavior During Therapy [facility] for tasks assessed/performed              Past Medical History:  Diagnosis Date   Allergic conjunctivitis 03/12/2015   Anxiety    Atrial fibrillation (HCC)    a. s/p DCCV in 12/2012 b. 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   History of AVN   Hypertension    IFG (impaired fasting glucose) 10/14/2013   Itchy eyes 03/12/2015   Morbid obesity (HCC)    Osteoarthritis    Personal history of colonic polyps - large hyperplastic 12/25/2013   Pre-diabetes    Psoriasis    active breakout left buttocks   Right hip pain    Right knee pain 07/06/2016   Skin infection 10/14/2013   Sleep apnea    uses mouth guard only   Swelling of limb 11/30/2012   Tachycardia, unspecified    Past Surgical History:  Procedure Laterality Date   CARDIOVERSION N/A 01/09/2013   Procedure: CARDIOVERSION;  Surgeon: Lewayne Bunting, MD;  Location: [facility] ENDOSCOPY;  Service: Cardiovascular;  Laterality: N/A;   CARDIOVERSION N/A 10/07/2014   Procedure: CARDIOVERSION;  Surgeon: Lewayne Bunting, MD;  Location: [facility] ENDOSCOPY;  Service: Cardiovascular;  Laterality: N/A;  09:00 Lido 60mg ,IV followed by  Propofol  70mg /IV    synched electrocardioversion at 120 joules for Afib,repeated at 200 joules, 70 mg...successfully changed to SR   CARDIOVERSION N/A 12/22/2021   Procedure: CARDIOVERSION;  Surgeon: Little Ishikawa, MD;  Location: [facility] ENDOSCOPY;  Service: Cardiovascular;  Laterality: N/A;   FRACTURE SURGERY  07/21/12   right hip arthroplasty   JOINT REPLACEMENT     Left hip Dr. Magnus Ivan 04-07-18   ORIF PATELLA Left 03/28/2023   Procedure: OPEN REDUCTION INTERNAL FIXATION (ORIF) PATELLA;  Surgeon: Ollen Gross, MD;  Location: [facility] ORS;  Service: Orthopedics;  Laterality: Left;   TOTAL HIP ARTHROPLASTY  07/21/2012   Procedure: TOTAL HIP ARTHROPLASTY;  Surgeon: Loanne Drilling, MD;  Location: [facility] ORS;  Service: Orthopedics;  Laterality: Right;   TOTAL HIP ARTHROPLASTY Left 04/07/2018   Procedure: LEFT TOTAL HIP ARTHROPLASTY ANTERIOR APPROACH;  Surgeon: Kathryne Hitch, MD;  Location: [facility] ORS;  Service: Orthopedics;  Laterality: Left;  Needs RNFA   Patient Active Problem List   Diagnosis Date Noted   Left patella fracture 03/28/2023   Encounter for annual physical exam 04/04/2022   Hypercoagulable state due to longstanding persistent atrial fibrillation (HCC) 04/04/2022   Depression 09/10/2021   Vitamin D deficiency 09/10/2021   DM2 (diabetes mellitus, type 2) (HCC) 09/10/2021   Status post left hip replacement  04/07/2018   Unilateral primary osteoarthritis, left hip 02/23/2018   Psoriatic arthropathy (HCC) 11/17/2017   Tinnitus 11/19/2016   Left hip pain 10/21/2015   Gastroesophageal reflux disease 09/19/2015   OSA (obstructive sleep apnea) 02/20/2015   Morbid obesity (HCC) 01/15/2015   Lymphedema 06/19/2014   History of colonic polyps 12/25/2013   Carpal tunnel syndrome 10/14/2013   Allergic rhinitis 06/20/2013   Edema 12/20/2012   Avascular necrosis of hip (HCC) 07/21/2012   Atrial fibrillation (HCC) 07/06/2012   Hypertension associated with diabetes (HCC) 09/14/2011    Psoriasis 08/16/2005    PCP: Sharlene Dory, DO   REFERRING PROVIDER: Eartha Inch, PA   REFERRING DIAG: Z51.89 (ICD-10-CM) - Encounter for other specified aftercare; L patellar fracture on 03/16/23, surgery 03/28/23  THERAPY DIAG:  Other abnormalities of gait and mobility  Muscle weakness (generalized)  Rationale for Evaluation and Treatment: Rehabilitation  ONSET DATE: 03/16/23, surgery 03/28/23  SUBJECTIVE:   SUBJECTIVE STATEMENT: L patellar fracture on 03/16/23, surgery 03/28/23  PERTINENT HISTORY: None available PAIN:  Are you having pain? Yes: NPRS scale: (soreness vs pain) 4/10 Pain location: L knee Pain description: ache, stiffness Aggravating factors: movement Relieving factors: rest  PRECAUTIONS: None  RED FLAGS: None   WEIGHT BEARING RESTRICTIONS: No  FALLS:  Has patient fallen in last 6 months? Yes. Number of falls 1  OCCUPATION: retired  PLOF: Independent  PATIENT GOALS: to walk and go up/down stairs again  NEXT MD VISIT: 11/24  OBJECTIVE:  Note: Objective measures were completed at Evaluation unless otherwise noted.  DIAGNOSTIC FINDINGS: IMPRESSION: 1. Transverse fracture of the inferior aspect of the patella. There is approximately 1.7 cm superior migration of the dominant more superior aspect of the patella. 2. Small joint effusion. 3. Mild-to-moderate medial compartment osteoarthritis.     Electronically Signed   By: Neita Garnet M.D.   On: 03/16/2023 16:01  PATIENT SURVEYS:  FOTO 38(63 predicted)  MUSCLE LENGTH: Hamstrings: not tested   POSTURE: No Significant postural limitations  LOWER EXTREMITY ROM:  A/PROM Right eval Left eval L 06/08/23  Hip flexion  90d   Hip extension     Hip abduction     Hip adduction     Hip internal rotation     Hip external rotation     Knee flexion  50/60d 92/95d  Knee extension  -2d   Ankle dorsiflexion  5d   Ankle plantarflexion     Ankle inversion     Ankle eversion       (Blank rows = not tested)  LOWER EXTREMITY MMT:  MMT Right eval Left eval  Hip flexion  3+  Hip extension  3+  Hip abduction  3+  Hip adduction    Hip internal rotation    Hip external rotation    Knee flexion  3-  Knee extension  3-  Ankle dorsiflexion    Ankle plantarflexion  3+  Ankle inversion    Ankle eversion     (Blank rows = not tested)  LOWER EXTREMITY SPECIAL TESTS:  deferred  FUNCTIONAL TESTS:  30 seconds chair stand test 1(limited due to apprehension)  GAIT: Distance walked: 96ft x2 Assistive device utilized: Single point cane Level of assistance: Complete Independence Comments: antalgic gait    TODAY'S TREATMENT:         [facility] Adult PT Treatment:  DATE: 06/08/23 Therapeutic Exercise: FAQs L 15x 2s hold Heel slides over towel in sitting with strap 15x 2s hold SAQs 15x  Heel slides with strap 15x 95d 5x STS Heel raises with UE support 15x Toe raises 15x with UE support 15x Manual Therapy: L patellar/scar mobilization                                                                                                                        DATE: 05/31/23 Eval and HEP   PATIENT EDUCATION:  Education details: Discussed eval findings, rehab rationale and POC and patient is in agreement  Person educated: Patient Education method: Explanation Education comprehension: verbalized understanding and needs further education  HOME EXERCISE PROGRAM: Access Code: [email or web address] URL: [email or web address] Date: 05/31/2023 Prepared by: Gustavus Bryant  Exercises - Supine Quad Set  - 5 x daily - 5 x weekly - 1 sets - 10 reps - 3s hold - Supine Heel Slide with Strap  - 5 x daily - 5 x weekly - 1 sets - 10 reps - Seated Heel Slide  - 5 x daily - 5 x weekly - 1 sets - 10 reps - Seated Long Arc Quad  - 5 x daily - 5 x weekly - 1 sets - 10 reps  ASSESSMENT:  CLINICAL IMPRESSION: Patient returns for first  f/u session and reports compliance with HEP.  Has ben mobilizing w/o brace at home and MD office aware.  Focus of today was quad activation and regaining knee flexion.  STM mobs to scar and patella. 95d flexion following heel slides.  (Eval)Patient is a 67 y.o. female who was seen today for physical therapy evaluation and treatment for L patellar fracture and ORIF resulting in pain, weakness, loss of ROM and decreased functional mobility. Patient able to generate a fair quad set and perform heel slides w/o exacerbation of symptoms.  Extension ROM is good but flexion limited to 60d PROM due to stiffness and apprehension. Patient fearful of activity due to possibility of injury.  OBJECTIVE IMPAIRMENTS: Abnormal gait, decreased activity tolerance, decreased balance, decreased endurance, decreased knowledge of condition, decreased mobility, difficulty walking, decreased ROM, decreased strength, obesity, and pain.   ACTIVITY LIMITATIONS: carrying, lifting, bending, sitting, standing, squatting, stairs, and locomotion level  PERSONAL FACTORS: Age, Fitness, and 1 comorbidity: DM  are also affecting patient's functional outcome.   REHAB POTENTIAL: Good  CLINICAL DECISION MAKING: Evolving/moderate complexity  EVALUATION COMPLEXITY: Low   GOALS: Goals reviewed with patient? No  SHORT TERM GOALS: Target date: 06/21/2023   Patient to demonstrate independence in HEP  Baseline: [id number] Goal status: INITIAL  2.  232ft ambulation with LRAD Baseline: 29ft with cane Goal status: INITIAL  3.  90d PROM L knee flexion Baseline: 60d Goal status: INITIAL   LONG TERM GOALS: Target date: 07/12/2023    Patient will score at least 63% on FOTO to signify clinically meaningful improvement in functional abilities.   Baseline: 38% Goal status: INITIAL  2.  Able to  negotiate 16 steps with most appropriate pattern Baseline: TBD Goal status: INITIAL  3.  110d L knee flexion Baseline: 60d Goal status:  INITIAL  4.  Increase LLE strength to 4/5 Baseline:  MMT Right eval Left eval  Hip flexion  3+  Hip extension  3+  Hip abduction  3+  Hip adduction    Hip internal rotation    Hip external rotation    Knee flexion  3-  Knee extension  3-  Ankle dorsiflexion    Ankle plantarflexion  3+   Goal status: INITIAL  5.  Decrease level of discomfort/soreness to 2/10 Baseline: 4/10 Goal status: INITIAL   PLAN:  PT FREQUENCY: 2x/week  PT DURATION: 6 weeks  PLANNED INTERVENTIONS: 97164- PT Re-evaluation, 97110-Therapeutic exercises, 97530- Therapeutic activity, 97112- Neuromuscular re-education, 97535- Self Care, 64332- Manual therapy, [id number]- Gait training, [phone]- Aquatic Therapy, 97014- Electrical stimulation (unattended), Stair training, Dry Needling, Joint mobilization, DME instructions, Cryotherapy, and Moist heat  PLAN FOR NEXT SESSION: HEP review and update, manual techniques as appropriate, aerobic tasks, ROM and flexibility activities, strengthening and PREs, TPDN, gait and balance training as needed     Hildred Laser, PT 06/08/2023, 3:53 PM

## 2023-06-08 ENCOUNTER — Ambulatory Visit: Payer: PPO

## 2023-06-08 DIAGNOSIS — M6281 Muscle weakness (generalized): Secondary | ICD-10-CM

## 2023-06-08 DIAGNOSIS — R2689 Other abnormalities of gait and mobility: Secondary | ICD-10-CM

## 2023-06-09 NOTE — Therapy (Deleted)
OUTPATIENT PHYSICAL THERAPY LOWER EXTREMITY EVALUATION   Patient Name: SIMRUN LAROQUE MRN: [id number] DOB:May 14, 1956, 67 y.o., female Today's Date: 06/09/2023  END OF SESSION:     Past Medical History:  Diagnosis Date   Allergic conjunctivitis 03/12/2015   Anxiety    Atrial fibrillation (HCC)    a. s/p DCCV in 12/2012 b. 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   History of AVN   Hypertension    IFG (impaired fasting glucose) 10/14/2013   Itchy eyes 03/12/2015   Morbid obesity (HCC)    Osteoarthritis    Personal history of colonic polyps - large hyperplastic 12/25/2013   Pre-diabetes    Psoriasis    active breakout left buttocks   Right hip pain    Right knee pain 07/06/2016   Skin infection 10/14/2013   Sleep apnea    uses mouth guard only   Swelling of limb 11/30/2012   Tachycardia, unspecified    Past Surgical History:  Procedure Laterality Date   CARDIOVERSION N/A 01/09/2013   Procedure: CARDIOVERSION;  Surgeon: Lewayne Bunting, MD;  Location: [facility] ENDOSCOPY;  Service: Cardiovascular;  Laterality: N/A;   CARDIOVERSION N/A 10/07/2014   Procedure: CARDIOVERSION;  Surgeon: Lewayne Bunting, MD;  Location: [facility] ENDOSCOPY;  Service: Cardiovascular;  Laterality: N/A;  09:00 Lido 60mg ,IV followed by Propofol  70mg /IV    synched electrocardioversion at 120 joules for Afib,repeated at 200 joules, 70 mg...successfully changed to SR   CARDIOVERSION N/A 12/22/2021   Procedure: CARDIOVERSION;  Surgeon: Little Ishikawa, MD;  Location: [facility] ENDOSCOPY;  Service: Cardiovascular;  Laterality: N/A;   FRACTURE SURGERY  07/21/12   right hip arthroplasty    JOINT REPLACEMENT     Left hip Dr. Magnus Ivan 04-07-18   ORIF PATELLA Left 03/28/2023   Procedure: OPEN REDUCTION INTERNAL FIXATION (ORIF) PATELLA;  Surgeon: Ollen Gross, MD;  Location: [facility] ORS;  Service: Orthopedics;  Laterality: Left;   TOTAL HIP ARTHROPLASTY  07/21/2012   Procedure: TOTAL HIP ARTHROPLASTY;  Surgeon: Loanne Drilling, MD;  Location: [facility] ORS;  Service: Orthopedics;  Laterality: Right;   TOTAL HIP ARTHROPLASTY Left 04/07/2018   Procedure: LEFT TOTAL HIP ARTHROPLASTY ANTERIOR APPROACH;  Surgeon: Kathryne Hitch, MD;  Location: [facility] ORS;  Service: Orthopedics;  Laterality: Left;  Needs RNFA   Patient Active Problem List   Diagnosis Date Noted   Left patella fracture 03/28/2023   Encounter for annual physical exam 04/04/2022   Hypercoagulable state due to longstanding persistent atrial fibrillation (HCC) 04/04/2022   Depression 09/10/2021   Vitamin D deficiency 09/10/2021   DM2 (diabetes mellitus, type 2) (HCC) 09/10/2021   Status post left hip replacement 04/07/2018   Unilateral primary osteoarthritis, left hip 02/23/2018   Psoriatic arthropathy (HCC) 11/17/2017   Tinnitus 11/19/2016   Left hip pain 10/21/2015   Gastroesophageal reflux disease 09/19/2015   OSA (obstructive sleep apnea) 02/20/2015   Morbid obesity (HCC) 01/15/2015   Lymphedema 06/19/2014   History of colonic polyps 12/25/2013   Carpal tunnel syndrome 10/14/2013   Allergic rhinitis 06/20/2013   Edema 12/20/2012   Avascular necrosis of hip (HCC) 07/21/2012   Atrial fibrillation (HCC) 07/06/2012   Hypertension  associated with diabetes (HCC) 09/14/2011   Psoriasis 08/16/2005    PCP: Sharlene Dory, DO   REFERRING PROVIDER: Eartha Inch, PA   REFERRING DIAG: [id number] (ICD-10-CM) - Encounter for other specified aftercare; L patellar fracture on 03/16/23, surgery 03/28/23  THERAPY DIAG:  No diagnosis found.  Rationale for Evaluation and Treatment: Rehabilitation  ONSET DATE: 03/16/23,  surgery 03/28/23  SUBJECTIVE:   SUBJECTIVE STATEMENT: L patellar fracture on 03/16/23, surgery 03/28/23  PERTINENT HISTORY: None available PAIN:  Are you having pain? Yes: NPRS scale: (soreness vs pain) 4/10 Pain location: L knee Pain description: ache, stiffness Aggravating factors: movement Relieving factors: rest  PRECAUTIONS: None  RED FLAGS: None   WEIGHT BEARING RESTRICTIONS: No  FALLS:  Has patient fallen in last 6 months? Yes. Number of falls 1  OCCUPATION: retired  PLOF: Independent  PATIENT GOALS: to walk and go up/down stairs again  NEXT MD VISIT: 11/24  OBJECTIVE:  Note: Objective measures were completed at Evaluation unless otherwise noted.  DIAGNOSTIC FINDINGS: IMPRESSION: 1. Transverse fracture of the inferior aspect of the patella. There is approximately 1.7 cm superior migration of the dominant more superior aspect of the patella. 2. Small joint effusion. 3. Mild-to-moderate medial compartment osteoarthritis.     Electronically Signed   By: Neita Garnet M.D.   On: 03/16/2023 16:01  PATIENT SURVEYS:  FOTO 38(63 predicted)  MUSCLE LENGTH: Hamstrings: not tested   POSTURE: No Significant postural limitations  LOWER EXTREMITY ROM:  A/PROM Right eval Left eval L 06/08/23  Hip flexion  90d   Hip extension     Hip abduction     Hip adduction     Hip internal rotation     Hip external rotation     Knee flexion  50/60d 92/95d  Knee extension  -2d   Ankle dorsiflexion  5d   Ankle plantarflexion     Ankle inversion     Ankle eversion      (Blank rows = not tested)  LOWER EXTREMITY MMT:  MMT Right eval Left eval  Hip flexion  3+  Hip extension  3+  Hip abduction  3+  Hip adduction    Hip internal rotation    Hip external rotation    Knee flexion  3-  Knee extension  3-  Ankle dorsiflexion    Ankle plantarflexion  3+  Ankle inversion    Ankle eversion     (Blank rows = not tested)  LOWER EXTREMITY SPECIAL TESTS:   deferred  FUNCTIONAL TESTS:  30 seconds chair stand test 1(limited due to apprehension)  GAIT: Distance walked: 70ft x2 Assistive device utilized: Single point cane Level of assistance: Complete Independence Comments: antalgic gait    TODAY'S TREATMENT:         [facility] Adult PT Treatment:                                                DATE: 06/08/23 Therapeutic Exercise: FAQs L 15x 2s hold Heel slides over towel in sitting with strap 15x 2s hold SAQs 15x  Heel slides with strap 15x 95d 5x STS Heel raises with UE support 15x Toe raises 15x with UE support 15x Manual Therapy: L patellar/scar mobilization  DATE: 05/31/23 Eval and HEP   PATIENT EDUCATION:  Education details: Discussed eval findings, rehab rationale and POC and patient is in agreement  Person educated: Patient Education method: Explanation Education comprehension: verbalized understanding and needs further education  HOME EXERCISE PROGRAM: Access Code: [email or web address] URL: [email or web address] Date: 05/31/2023 Prepared by: Gustavus Bryant  Exercises - Supine Quad Set  - 5 x daily - 5 x weekly - 1 sets - 10 reps - 3s hold - Supine Heel Slide with Strap  - 5 x daily - 5 x weekly - 1 sets - 10 reps - Seated Heel Slide  - 5 x daily - 5 x weekly - 1 sets - 10 reps - Seated Long Arc Quad  - 5 x daily - 5 x weekly - 1 sets - 10 reps  ASSESSMENT:  CLINICAL IMPRESSION: Patient returns for first f/u session and reports compliance with HEP.  Has ben mobilizing w/o brace at home and MD office aware.  Focus of today was quad activation and regaining knee flexion.  STM mobs to scar and patella. 95d flexion following heel slides.  (Eval)Patient is a 67 y.o. female who was seen today for physical therapy evaluation and treatment for L patellar fracture and ORIF resulting in pain, weakness, loss of  ROM and decreased functional mobility. Patient able to generate a fair quad set and perform heel slides w/o exacerbation of symptoms.  Extension ROM is good but flexion limited to 60d PROM due to stiffness and apprehension. Patient fearful of activity due to possibility of injury.  OBJECTIVE IMPAIRMENTS: Abnormal gait, decreased activity tolerance, decreased balance, decreased endurance, decreased knowledge of condition, decreased mobility, difficulty walking, decreased ROM, decreased strength, obesity, and pain.   ACTIVITY LIMITATIONS: carrying, lifting, bending, sitting, standing, squatting, stairs, and locomotion level  PERSONAL FACTORS: Age, Fitness, and 1 comorbidity: DM  are also affecting patient's functional outcome.   REHAB POTENTIAL: Good  CLINICAL DECISION MAKING: Evolving/moderate complexity  EVALUATION COMPLEXITY: Low   GOALS: Goals reviewed with patient? No  SHORT TERM GOALS: Target date: 06/21/2023   Patient to demonstrate independence in HEP  Baseline: [id number] Goal status: INITIAL  2.  231ft ambulation with LRAD Baseline: 76ft with cane Goal status: INITIAL  3.  90d PROM L knee flexion Baseline: 60d Goal status: INITIAL   LONG TERM GOALS: Target date: 07/12/2023    Patient will score at least 63% on FOTO to signify clinically meaningful improvement in functional abilities.   Baseline: 38% Goal status: INITIAL  2.  Able to negotiate 16 steps with most appropriate pattern Baseline: TBD Goal status: INITIAL  3.  110d L knee flexion Baseline: 60d Goal status: INITIAL  4.  Increase LLE strength to 4/5 Baseline:  MMT Right eval Left eval  Hip flexion  3+  Hip extension  3+  Hip abduction  3+  Hip adduction    Hip internal rotation    Hip external rotation    Knee flexion  3-  Knee extension  3-  Ankle dorsiflexion    Ankle plantarflexion  3+   Goal status: INITIAL  5.  Decrease level of discomfort/soreness to 2/10 Baseline: 4/10 Goal  status: INITIAL   PLAN:  PT FREQUENCY: 2x/week  PT DURATION: 6 weeks  PLANNED INTERVENTIONS: 97164- PT Re-evaluation, 97110-Therapeutic exercises, 97530- Therapeutic activity, 97112- Neuromuscular re-education, 97535- Self Care, 14782- Manual therapy, [id number]- Gait training, [id number]- Aquatic Therapy, 97014- Electrical stimulation (unattended), Stair training, Dry Needling, Joint mobilization, DME instructions, Cryotherapy, and Moist heat  PLAN FOR  NEXT SESSION: HEP review and update, manual techniques as appropriate, aerobic tasks, ROM and flexibility activities, strengthening and PREs, TPDN, gait and balance training as needed     Hildred Laser, PT 06/09/2023, 10:12 AM

## 2023-06-15 ENCOUNTER — Ambulatory Visit: Payer: PPO

## 2023-06-15 DIAGNOSIS — R2689 Other abnormalities of gait and mobility: Secondary | ICD-10-CM | POA: Diagnosis not present

## 2023-06-15 DIAGNOSIS — M25662 Stiffness of left knee, not elsewhere classified: Secondary | ICD-10-CM

## 2023-06-15 DIAGNOSIS — M6281 Muscle weakness (generalized): Secondary | ICD-10-CM

## 2023-06-15 NOTE — Therapy (Signed)
OUTPATIENT PHYSICAL THERAPY TREATMENT   Patient Name: Monica [name] MRN: [id number] DOB:1955-11-22, 67 y.o., female Today's Date: 06/15/2023  END OF SESSION:  PT End of Session - 06/15/23 1602     Visit Number 3    Number of Visits 12    Date for PT Re-Evaluation 07/31/23    Authorization Type HTA    PT Start Time 1548    PT Stop Time 1616    PT Time Calculation (min) 28 min    Activity Tolerance Patient tolerated treatment well    Behavior During Therapy [facility] for tasks assessed/performed               Past Medical History:  Diagnosis Date   Allergic conjunctivitis 03/12/2015   Anxiety    Atrial fibrillation (HCC)    a. s/p DCCV in 12/2012 b. 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   History of AVN   Hypertension    IFG (impaired fasting glucose) 10/14/2013   Itchy eyes 03/12/2015   Morbid obesity (HCC)    Osteoarthritis    Personal history of colonic polyps - large hyperplastic 12/25/2013   Pre-diabetes    Psoriasis    active breakout left buttocks   Right hip pain    Right knee pain 07/06/2016   Skin infection 10/14/2013   Sleep apnea    uses mouth guard only   Swelling of limb 11/30/2012   Tachycardia, unspecified    Past Surgical History:  Procedure Laterality Date   CARDIOVERSION N/A 01/09/2013   Procedure: CARDIOVERSION;  Surgeon: Lewayne Bunting, MD;  Location: [facility] ENDOSCOPY;  Service: Cardiovascular;  Laterality: N/A;   CARDIOVERSION N/A 10/07/2014   Procedure: CARDIOVERSION;  Surgeon: Lewayne Bunting, MD;  Location: [facility] ENDOSCOPY;  Service: Cardiovascular;  Laterality: N/A;  09:00 Lido 60mg ,IV followed by Propofol   70mg /IV    synched electrocardioversion at 120 joules for Afib,repeated at 200 joules, 70 mg...successfully changed to SR   CARDIOVERSION N/A 12/22/2021   Procedure: CARDIOVERSION;  Surgeon: Little Ishikawa, MD;  Location: [facility] ENDOSCOPY;  Service: Cardiovascular;  Laterality: N/A;   FRACTURE SURGERY  07/21/12   right hip arthroplasty   JOINT REPLACEMENT     Left hip Dr. Magnus Ivan 04-07-18   ORIF PATELLA Left 03/28/2023   Procedure: OPEN REDUCTION INTERNAL FIXATION (ORIF) PATELLA;  Surgeon: Ollen Gross, MD;  Location: [facility] ORS;  Service: Orthopedics;  Laterality: Left;   TOTAL HIP ARTHROPLASTY  07/21/2012   Procedure: TOTAL HIP ARTHROPLASTY;  Surgeon: Loanne Drilling, MD;  Location: [facility] ORS;  Service: Orthopedics;  Laterality: Right;   TOTAL HIP ARTHROPLASTY Left 04/07/2018   Procedure: LEFT TOTAL HIP ARTHROPLASTY ANTERIOR APPROACH;  Surgeon: Kathryne Hitch, MD;  Location: [facility] ORS;  Service: Orthopedics;  Laterality: Left;  Needs RNFA   Patient Active Problem List   Diagnosis Date Noted   Left patella fracture 03/28/2023   Encounter for annual physical exam 04/04/2022   Hypercoagulable state due to longstanding persistent atrial fibrillation (HCC) 04/04/2022   Depression 09/10/2021   Vitamin D deficiency 09/10/2021   DM2 (diabetes mellitus, type 2) (HCC) 09/10/2021   Status post left hip replacement 04/07/2018  Unilateral primary osteoarthritis, left hip 02/23/2018   Psoriatic arthropathy (HCC) 11/17/2017   Tinnitus 11/19/2016   Left hip pain 10/21/2015   Gastroesophageal reflux disease 09/19/2015   OSA (obstructive sleep apnea) 02/20/2015   Morbid obesity (HCC) 01/15/2015   Lymphedema 06/19/2014   History of colonic polyps 12/25/2013   Carpal tunnel syndrome 10/14/2013   Allergic rhinitis 06/20/2013   Edema 12/20/2012   Avascular necrosis of hip (HCC) 07/21/2012   Atrial fibrillation (HCC) 07/06/2012   Hypertension associated with diabetes (HCC) 09/14/2011   Psoriasis  08/16/2005    PCP: Sharlene Dory, DO   REFERRING PROVIDER: Eartha Inch, PA   REFERRING DIAG: Z51.89 (ICD-10-CM) - Encounter for other specified aftercare; L patellar fracture on 03/16/23, surgery 03/28/23  THERAPY DIAG:  Other abnormalities of gait and mobility  Muscle weakness (generalized)  Decreased range of motion (ROM) of left knee  Rationale for Evaluation and Treatment: Rehabilitation  ONSET DATE: 03/16/23, surgery 03/28/23  SUBJECTIVE:   SUBJECTIVE STATEMENT: Pt presents to PT with continued L knee pain. Arrived to treatment on wrong day, agrees to shortened session.   PERTINENT HISTORY: None available PAIN:  Are you having pain?  Yes: NPRS scale: (soreness vs pain) 4/10 Pain location: L knee Pain description: ache, stiffness Aggravating factors: movement Relieving factors: rest  PRECAUTIONS: None  RED FLAGS: None   WEIGHT BEARING RESTRICTIONS: No  FALLS:  Has patient fallen in last 6 months? Yes. Number of falls 1  OCCUPATION: retired  PLOF: Independent  PATIENT GOALS: to walk and go up/down stairs again  NEXT MD VISIT: 11/24  OBJECTIVE:  Note: Objective measures were completed at Evaluation unless otherwise noted.  DIAGNOSTIC FINDINGS: IMPRESSION: 1. Transverse fracture of the inferior aspect of the patella. There is approximately 1.7 cm superior migration of the dominant more superior aspect of the patella. 2. Small joint effusion. 3. Mild-to-moderate medial compartment osteoarthritis.     Electronically Signed   By: Neita Garnet M.D.   On: 03/16/2023 16:01  PATIENT SURVEYS:  FOTO 38(63 predicted)  MUSCLE LENGTH: Hamstrings: not tested   POSTURE: No Significant postural limitations  LOWER EXTREMITY ROM:  A/PROM Right eval Left eval L 06/08/23 L 06/15/23  Hip flexion  90d    Hip extension      Hip abduction      Hip adduction      Hip internal rotation      Hip external rotation      Knee flexion  50/60d  92/95d   Knee extension  -2d    Ankle dorsiflexion  5d    Ankle plantarflexion      Ankle inversion      Ankle eversion       (Blank rows = not tested)  LOWER EXTREMITY MMT:  MMT Right eval Left eval  Hip flexion  3+  Hip extension  3+  Hip abduction  3+  Hip adduction    Hip internal rotation    Hip external rotation    Knee flexion  3-  Knee extension  3-  Ankle dorsiflexion    Ankle plantarflexion  3+  Ankle inversion    Ankle eversion     (Blank rows = not tested)  LOWER EXTREMITY SPECIAL TESTS:  deferred  FUNCTIONAL TESTS:  30 seconds chair stand test 1(limited due to apprehension)  GAIT: Distance walked: 45ft x2 Assistive device utilized: Single point cane Level of assistance: Complete Independence Comments: antalgic gait    TODAY'S TREATMENT:  [facility] Adult PT Treatment:                                                DATE: 06/15/23 Therapeutic Exercise: Supine QS x 10- 5" hold Supine SLR x 10 L - difficult  SAQ 2x15 R Supine L heel slide 2x10 FAQs L x 20 2s hold Gait Training: Stair navigation up/down 5 steps with bilateral UE support; step-to pattern with L in stance during descending   [facility] Adult PT Treatment:                                                DATE: 06/08/23 Therapeutic Exercise: FAQs L 15x 2s hold Heel slides over towel in sitting with strap 15x 2s hold SAQs 15x  Heel slides with strap 15x 95d 5x STS Heel raises with UE support 15x Toe raises 15x with UE support 15x Manual Therapy: L patellar/scar mobilization  PATIENT EDUCATION:  Education details: Discussed eval findings, rehab rationale and POC and patient is in agreement  Person educated: Patient Education method: Explanation Education comprehension: verbalized understanding and needs further education  HOME EXERCISE PROGRAM: Access Code: [email or web address] URL: [email or web address] Date: 05/31/2023 Prepared by: Gustavus Bryant  Exercises - Supine Quad Set   - 5 x daily - 5 x weekly - 1 sets - 10 reps - 3s hold - Supine Heel Slide with Strap  - 5 x daily - 5 x weekly - 1 sets - 10 reps - Seated Heel Slide  - 5 x daily - 5 x weekly - 1 sets - 10 reps - Seated Long Arc Quad  - 5 x daily - 5 x weekly - 1 sets - 10 reps  ASSESSMENT:  CLINICAL IMPRESSION:  Pt was able to complete all prescribed exercises with no adverse effect. Therapy focused on improving L knee strength and ROM. Pt continues to benefit form skilled PT services, will continue to progress as able per POC.   (Eval)Patient is a 67 y.o. female who was seen today for physical therapy evaluation and treatment for L patellar fracture and ORIF resulting in pain, weakness, loss of ROM and decreased functional mobility. Patient able to generate a fair quad set and perform heel slides w/o exacerbation of symptoms.  Extension ROM is good but flexion limited to 60d PROM due to stiffness and apprehension. Patient fearful of activity due to possibility of injury.  OBJECTIVE IMPAIRMENTS: Abnormal gait, decreased activity tolerance, decreased balance, decreased endurance, decreased knowledge of condition, decreased mobility, difficulty walking, decreased ROM, decreased strength, obesity, and pain.   ACTIVITY LIMITATIONS: carrying, lifting, bending, sitting, standing, squatting, stairs, and locomotion level  PERSONAL FACTORS: Age, Fitness, and 1 comorbidity: DM  are also affecting patient's functional outcome.   REHAB POTENTIAL: Good  CLINICAL DECISION MAKING: Evolving/moderate complexity  EVALUATION COMPLEXITY: Low   GOALS: Goals reviewed with patient? No  SHORT TERM GOALS: Target date: 06/21/2023   Patient to demonstrate independence in HEP  Baseline: [id number] Goal status: INITIAL  2.  24ft ambulation with LRAD Baseline: 24ft with cane Goal status: INITIAL  3.  90d PROM L knee flexion Baseline: 60d Goal status: INITIAL   LONG TERM GOALS: Target date: 07/12/2023    Patient will  score at  least 63% on FOTO to signify clinically meaningful improvement in functional abilities.   Baseline: 38% Goal status: INITIAL  2.  Able to negotiate 16 steps with most appropriate pattern Baseline: TBD Goal status: INITIAL  3.  110d L knee flexion Baseline: 60d Goal status: INITIAL  4.  Increase LLE strength to 4/5 Baseline:  MMT Right eval Left eval  Hip flexion  3+  Hip extension  3+  Hip abduction  3+  Hip adduction    Hip internal rotation    Hip external rotation    Knee flexion  3-  Knee extension  3-  Ankle dorsiflexion    Ankle plantarflexion  3+   Goal status: INITIAL  5.  Decrease level of discomfort/soreness to 2/10 Baseline: 4/10 Goal status: INITIAL   PLAN:  PT FREQUENCY: 2x/week  PT DURATION: 6 weeks  PLANNED INTERVENTIONS: 97164- PT Re-evaluation, 97110-Therapeutic exercises, 97530- Therapeutic activity, 97112- Neuromuscular re-education, 97535- Self Care, 81191- Manual therapy, [id number]- Gait training, [phone]- Aquatic Therapy, 97014- Electrical stimulation (unattended), Stair training, Dry Needling, Joint mobilization, DME instructions, Cryotherapy, and Moist heat  PLAN FOR NEXT SESSION: HEP review and update, manual techniques as appropriate, aerobic tasks, ROM and flexibility activities, strengthening and PREs, TPDN, gait and balance training as needed     Eloy End, PT 06/15/2023, 4:40 PM

## 2023-06-17 ENCOUNTER — Ambulatory Visit: Payer: PPO | Attending: Student

## 2023-06-17 DIAGNOSIS — M6281 Muscle weakness (generalized): Secondary | ICD-10-CM | POA: Insufficient documentation

## 2023-06-17 DIAGNOSIS — R2689 Other abnormalities of gait and mobility: Secondary | ICD-10-CM | POA: Insufficient documentation

## 2023-06-17 DIAGNOSIS — M25662 Stiffness of left knee, not elsewhere classified: Secondary | ICD-10-CM | POA: Diagnosis not present

## 2023-06-21 ENCOUNTER — Encounter: Payer: Self-pay | Admitting: Family Medicine

## 2023-06-21 ENCOUNTER — Ambulatory Visit (INDEPENDENT_AMBULATORY_CARE_PROVIDER_SITE_OTHER): Payer: PPO | Admitting: Family Medicine

## 2023-06-21 ENCOUNTER — Other Ambulatory Visit (HOSPITAL_BASED_OUTPATIENT_CLINIC_OR_DEPARTMENT_OTHER): Payer: Self-pay

## 2023-06-21 VITALS — BP 122/74 | HR 69 | Temp 97.8°F | Resp 16 | Ht 65.0 in | Wt 257.0 lb

## 2023-06-21 DIAGNOSIS — Z1211 Encounter for screening for malignant neoplasm of colon: Secondary | ICD-10-CM

## 2023-06-21 DIAGNOSIS — E1169 Type 2 diabetes mellitus with other specified complication: Secondary | ICD-10-CM | POA: Diagnosis not present

## 2023-06-21 DIAGNOSIS — I10 Essential (primary) hypertension: Secondary | ICD-10-CM | POA: Diagnosis not present

## 2023-06-21 DIAGNOSIS — Z7985 Long-term (current) use of injectable non-insulin antidiabetic drugs: Secondary | ICD-10-CM

## 2023-06-21 DIAGNOSIS — F411 Generalized anxiety disorder: Secondary | ICD-10-CM | POA: Insufficient documentation

## 2023-06-21 DIAGNOSIS — Z23 Encounter for immunization: Secondary | ICD-10-CM

## 2023-06-21 MED ORDER — TIZANIDINE HCL 2 MG PO TABS
2.0000 mg | ORAL_TABLET | Freq: Four times a day (QID) | ORAL | 0 refills | Status: DC | PRN
  Filled 2023-06-21: qty 40, 10d supply, fill #0

## 2023-06-21 NOTE — Patient Instructions (Addendum)
Give [place] 2-3 business days to get the results of your labs back.   Keep the diet clean and stay active.  Please schedule your eye exam.   Call Center for [facility] at [facility] at [phone] for an appointment.  They are located at [address], [address], [place], [place], [address] (right across the hall from our office).  Artificial tears like Refresh and Systane may be used for comfort. OK to get generic version. Generally people use them every 2-4 hours, but you can use them as much as you want because there is no medication in it.  Please consider counseling. Contact [phone] to schedule an appointment or inquire about cost/insurance coverage.  [facility] located at [address], [place], [place].  Phone number = [phone].  Dr. Regan Lemming - Adult Psychiatry.    [facility] located at [address] [place], [place], [place]. Phone number = [phone].   [facility] located at [address], [place], [place].  Phone number = [phone].   [facility] located at [address] [place], [place], [place].  Phone number = [phone].  Let us know if you need anything.

## 2023-06-21 NOTE — Progress Notes (Signed)
Subjective:   Chief Complaint  Patient presents with   Medication Refill    Medication check    Monica [name] is a 67 y.o. female here for follow-up of diabetes.   Thia is not monitoring his sugars Patient does not require insulin.   Medications include: Trulicity 3 g/week Diet is OK.  Exercise: therapy from surgery  Hypertension Patient presents for hypertension follow up. She does monitor home blood pressures. She is compliant with medication- Bystolic 5 mg/d (declines ARB/ACEI's). Patient has these side effects of medication: none Diet/exercise as above.  No CP or SOB.   GAD Stable on Zoloft 75 mg/d. Compliant, no AE's. No SI or HI. No self medication. Not following with a therapist.   Past Medical History:  Diagnosis Date   Allergic conjunctivitis 03/12/2015   Anxiety    Atrial fibrillation (HCC)    a. s/p DCCV in 12/2012 b. 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   History of AVN   Hypertension    IFG (impaired fasting glucose) 10/14/2013   Itchy eyes 03/12/2015   Morbid obesity (HCC)    Osteoarthritis    Personal history of colonic polyps - large hyperplastic 12/25/2013   Pre-diabetes    Psoriasis    active breakout left buttocks   Right hip pain    Right knee pain 07/06/2016   Skin infection 10/14/2013   Sleep apnea    uses mouth guard only   Swelling of limb 11/30/2012   Tachycardia, unspecified      Related testing: Retinal exam: Due Pneumovax: Due  Objective:  BP 122/74 (BP Location: Left Arm, Patient Position: Sitting, Cuff Size: Normal)   Pulse 69   Temp 97.8 F (36.6 C) (Oral)   Resp 16   Ht 5\' 5"  (1.651 m)    Wt 257 lb (116.6 kg)   LMP 06/19/2013   SpO2 98%   BMI 42.77 kg/m  General:  Well developed, well nourished, in no apparent distress Skin:  Warm, no pallor or diaphoresis Lungs:  CTAB, no access msc use Cardio:  RRR, no bruits, no LE edema Musculoskeletal:  Symmetrical muscle groups noted without atrophy or deformity Neuro:  Sensation intact to pinprick on feet Psych: Age appropriate judgment and insight  Assessment:   Type 2 diabetes mellitus with other specified complication, without long-term current use of insulin (HCC) - Plan: Comprehensive metabolic panel, Lipid panel, Hemoglobin A1c, Microalbumin / creatinine urine ratio  Essential hypertension  GAD (generalized anxiety disorder)  Screen for colon cancer   Plan:   Chronic, stable. Cont Trulicity 3 mg/week. Counseled on diet and exercise. Needs to sched eye exam. Could consider changing to [facility] for weight loss effects.  Chronic, stable. Cont Bystolic 5 mg/d. Chronic, stable. Cont Zoloft 75 mg/d. Counseling info provided. GYN info provided. Cologuard ordered again. PCV20 today.   F/u in 6 mo. The patient voiced understanding and agreement to the plan.  Jilda Roche Stanford, DO 06/21/23 4:37 PM

## 2023-06-21 NOTE — Therapy (Deleted)
OUTPATIENT PHYSICAL THERAPY TREATMENT   Patient Name: Monica [name] MRN: [id number] DOB:1956-07-10, 67 y.o., female Today's Date: 06/21/2023  END OF SESSION:       Past Medical History:  Diagnosis Date   Allergic conjunctivitis 03/12/2015   Anxiety    Atrial fibrillation (HCC)    a. s/p DCCV in 12/2012 b. 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   History of AVN   Hypertension    IFG (impaired fasting glucose) 10/14/2013   Itchy eyes 03/12/2015   Morbid obesity (HCC)    Osteoarthritis    Personal history of colonic polyps - large hyperplastic 12/25/2013   Pre-diabetes    Psoriasis    active breakout left buttocks   Right hip pain    Right knee pain 07/06/2016   Skin infection 10/14/2013   Sleep apnea    uses mouth guard only   Swelling of limb 11/30/2012   Tachycardia, unspecified    Past Surgical History:  Procedure Laterality Date   CARDIOVERSION N/A 01/09/2013   Procedure: CARDIOVERSION;  Surgeon: Lewayne Bunting, MD;  Location: [facility] ENDOSCOPY;  Service: Cardiovascular;  Laterality: N/A;   CARDIOVERSION N/A 10/07/2014   Procedure: CARDIOVERSION;  Surgeon: Lewayne Bunting, MD;  Location: [facility] ENDOSCOPY;  Service: Cardiovascular;  Laterality: N/A;  09:00 Lido 60mg ,IV followed by Propofol  70mg /IV    synched electrocardioversion at 120 joules for Afib,repeated at 200 joules, 70 mg...successfully changed to SR   CARDIOVERSION N/A 12/22/2021   Procedure: CARDIOVERSION;  Surgeon: Little Ishikawa, MD;  Location: [facility] ENDOSCOPY;  Service: Cardiovascular;  Laterality: N/A;   FRACTURE SURGERY  07/21/12   right hip arthroplasty   JOINT  REPLACEMENT     Left hip Dr. Magnus Ivan 04-07-18   ORIF PATELLA Left 03/28/2023   Procedure: OPEN REDUCTION INTERNAL FIXATION (ORIF) PATELLA;  Surgeon: Ollen Gross, MD;  Location: [facility] ORS;  Service: Orthopedics;  Laterality: Left;   TOTAL HIP ARTHROPLASTY  07/21/2012   Procedure: TOTAL HIP ARTHROPLASTY;  Surgeon: Loanne Drilling, MD;  Location: [facility] ORS;  Service: Orthopedics;  Laterality: Right;   TOTAL HIP ARTHROPLASTY Left 04/07/2018   Procedure: LEFT TOTAL HIP ARTHROPLASTY ANTERIOR APPROACH;  Surgeon: Kathryne Hitch, MD;  Location: [facility] ORS;  Service: Orthopedics;  Laterality: Left;  Needs RNFA   Patient Active Problem List   Diagnosis Date Noted   Left patella fracture 03/28/2023   Encounter for annual physical exam 04/04/2022   Hypercoagulable state due to longstanding persistent atrial fibrillation (HCC) 04/04/2022   Depression 09/10/2021   Vitamin D deficiency 09/10/2021   DM2 (diabetes mellitus, type 2) (HCC) 09/10/2021   Status post left hip replacement 04/07/2018   Unilateral primary osteoarthritis, left hip 02/23/2018   Psoriatic arthropathy (HCC) 11/17/2017   Tinnitus 11/19/2016   Left hip pain 10/21/2015   Gastroesophageal reflux disease 09/19/2015   OSA (obstructive sleep apnea) 02/20/2015   Morbid obesity (HCC) 01/15/2015   Lymphedema 06/19/2014   History of colonic polyps 12/25/2013   Carpal tunnel syndrome 10/14/2013   Allergic rhinitis 06/20/2013   Edema 12/20/2012   Avascular necrosis of hip (HCC) 07/21/2012   Atrial fibrillation (HCC) 07/06/2012   Hypertension  associated with diabetes (HCC) 09/14/2011   Psoriasis 08/16/2005    PCP: Sharlene Dory, DO   REFERRING PROVIDER: Eartha Inch, PA   REFERRING DIAG: [phone] (ICD-10-CM) - Encounter for other specified aftercare; L patellar fracture on 03/16/23, surgery 03/28/23  THERAPY DIAG:  No diagnosis found.  Rationale for Evaluation and Treatment: Rehabilitation  ONSET DATE: 03/16/23, surgery  03/28/23  SUBJECTIVE:   SUBJECTIVE STATEMENT:  L knee pain minimal main concern is stiffness and swelling  PERTINENT HISTORY: None available PAIN:  Are you having pain?  Yes: NPRS scale: (soreness vs pain) 4/10 Pain location: L knee Pain description: ache, stiffness Aggravating factors: movement Relieving factors: rest  PRECAUTIONS: None  RED FLAGS: None   WEIGHT BEARING RESTRICTIONS: No  FALLS:  Has patient fallen in last 6 months? Yes. Number of falls 1  OCCUPATION: retired  PLOF: Independent  PATIENT GOALS: to walk and go up/down stairs again  NEXT MD VISIT: 11/24  OBJECTIVE:  Note: Objective measures were completed at Evaluation unless otherwise noted.  DIAGNOSTIC FINDINGS: IMPRESSION: 1. Transverse fracture of the inferior aspect of the patella. There is approximately 1.7 cm superior migration of the dominant more superior aspect of the patella. 2. Small joint effusion. 3. Mild-to-moderate medial compartment osteoarthritis.     Electronically Signed   By: Neita Garnet M.D.   On: 03/16/2023 16:01  PATIENT SURVEYS:  FOTO 38(63 predicted)  MUSCLE LENGTH: Hamstrings: not tested   POSTURE: No Significant postural limitations  LOWER EXTREMITY ROM:  A/PROM Right eval Left eval L 06/08/23 L 06/17/23  Hip flexion  90d    Hip extension      Hip abduction      Hip adduction      Hip internal rotation      Hip external rotation      Knee flexion  50/60d 92/95d /100d   Knee extension  -2d    Ankle dorsiflexion  5d    Ankle plantarflexion      Ankle inversion      Ankle eversion       (Blank rows = not tested)  LOWER EXTREMITY MMT:  MMT Right eval Left eval  Hip flexion  3+  Hip extension  3+  Hip abduction  3+  Hip adduction    Hip internal rotation    Hip external rotation    Knee flexion  3-  Knee extension  3-  Ankle dorsiflexion    Ankle plantarflexion  3+  Ankle inversion    Ankle eversion     (Blank rows = not  tested)  LOWER EXTREMITY SPECIAL TESTS:  deferred  FUNCTIONAL TESTS:  30 seconds chair stand test 1(limited due to apprehension)  GAIT: Distance walked: 52ft x2 Assistive device utilized: Single point cane Level of assistance: Complete Independence Comments: antalgic gait    TODAY'S TREATMENT:   [facility] Adult PT Treatment:                                                DATE: 06/17/23 Therapeutic Exercise: Nustep L2 6 min seat 11 FAQs with towel roll leverage 2# 2s hold 15x2 SAQs 2# 15x2 Heel slides over board with strap 15x Heel raises 15x UE support Toe raises UE support 15x Standing hamstring curl L 15x UE support  Therapeutic Activity: Review of 4 steps with cane and single rail, step to pattern  [facility] Adult PT Treatment:                                                DATE: 06/15/23 Therapeutic Exercise: Supine QS x 10- 5" hold Supine SLR x 10 L - difficult  SAQ 2x15 R Supine L heel slide 2x10 FAQs L x 20 2s hold Gait Training: Stair navigation up/down 5 steps with bilateral UE support; step-to pattern with L in stance during descending   [facility] Adult PT Treatment:                                                DATE: 06/08/23 Therapeutic Exercise: FAQs L 15x 2s hold Heel slides over towel in sitting with strap 15x 2s hold SAQs 15x  Heel slides with strap 15x 95d 5x STS Heel raises with UE support 15x Toe raises 15x with UE support 15x Manual Therapy: L patellar/scar mobilization  PATIENT EDUCATION:  Education details: Discussed eval findings, rehab rationale and POC and patient is in agreement  Person educated: Patient Education method: Explanation Education comprehension: verbalized understanding and needs further education  HOME EXERCISE PROGRAM: Access Code: [email or web address] URL: [email or web address] Date: 05/31/2023 Prepared by: Gustavus Bryant  Exercises - Supine Quad Set  - 5 x daily - 5 x weekly - 1 sets - 10 reps - 3s hold - Supine Heel Slide  with Strap  - 5 x daily - 5 x weekly - 1 sets - 10 reps - Seated Heel Slide  - 5 x daily - 5 x weekly - 1 sets - 10 reps - Seated Long Arc Quad  - 5 x daily - 5 x weekly - 1 sets - 10 reps  ASSESSMENT:  CLINICAL IMPRESSION: Continued to strength L quad  as well as regain flexion.  Initiated aerobic work, reviewed stair negotiation, added reps and resistance as noted.  Began new tasks to include ankles and hamstrings.   (Eval)Patient is a 66 y.o. female who was seen today for physical therapy evaluation and treatment for L patellar fracture and ORIF resulting in pain, weakness, loss of ROM and decreased functional mobility. Patient able to generate a fair quad set and perform heel slides w/o exacerbation of symptoms.  Extension ROM is good but flexion limited to 60d PROM due to stiffness and apprehension. Patient fearful of activity due to possibility of injury.  OBJECTIVE IMPAIRMENTS: Abnormal gait, decreased activity tolerance, decreased balance, decreased endurance, decreased knowledge of condition, decreased mobility, difficulty walking, decreased ROM, decreased strength, obesity, and pain.   ACTIVITY LIMITATIONS: carrying, lifting, bending, sitting, standing, squatting, stairs, and locomotion level  PERSONAL FACTORS: Age, Fitness, and 1 comorbidity: DM  are also affecting patient's functional outcome.   REHAB POTENTIAL: Good  CLINICAL DECISION MAKING: Evolving/moderate complexity  EVALUATION COMPLEXITY: Low   GOALS: Goals reviewed with patient? No  SHORT TERM GOALS: Target date: 06/21/2023   Patient to demonstrate independence in HEP  Baseline: [id number] Goal status: INITIAL  2.  220ft ambulation with LRAD Baseline: 50ft with cane Goal status: INITIAL  3.  90d PROM L knee flexion Baseline: 60d Goal status: INITIAL   LONG TERM GOALS: Target date: 07/12/2023    Patient will score at least 63% on FOTO to signify  clinically meaningful improvement in functional abilities.    Baseline: 38% Goal status: INITIAL  2.  Able to negotiate 16 steps with most appropriate pattern Baseline: TBD Goal status: INITIAL  3.  110d L knee flexion Baseline: 60d Goal status: INITIAL  4.  Increase LLE strength to 4/5 Baseline:  MMT Right eval Left eval  Hip flexion  3+  Hip extension  3+  Hip abduction  3+  Hip adduction    Hip internal rotation    Hip external rotation    Knee flexion  3-  Knee extension  3-  Ankle dorsiflexion    Ankle plantarflexion  3+   Goal status: INITIAL  5.  Decrease level of discomfort/soreness to 2/10 Baseline: 4/10 Goal status: INITIAL   PLAN:  PT FREQUENCY: 2x/week  PT DURATION: 6 weeks  PLANNED INTERVENTIONS: [id number]- PT Re-evaluation, 97110-Therapeutic exercises, 97530- Therapeutic activity, 97112- Neuromuscular re-education, 97535- Self Care, [id number]- Manual therapy, [id number]- Gait training, [id number]- Aquatic Therapy, [id number]- Electrical stimulation (unattended), Stair training, Dry Needling, Joint mobilization, DME instructions, Cryotherapy, and Moist heat  PLAN FOR NEXT SESSION: HEP review and update, manual techniques as appropriate, aerobic tasks, ROM and flexibility activities, strengthening and PREs, TPDN, gait and balance training as needed     Hildred Laser, PT 06/21/2023, 1:08 PM

## 2023-06-22 ENCOUNTER — Ambulatory Visit: Payer: PPO

## 2023-06-27 ENCOUNTER — Other Ambulatory Visit (HOSPITAL_BASED_OUTPATIENT_CLINIC_OR_DEPARTMENT_OTHER): Payer: Self-pay

## 2023-06-27 ENCOUNTER — Other Ambulatory Visit: Payer: PPO

## 2023-06-27 DIAGNOSIS — L400 Psoriasis vulgaris: Secondary | ICD-10-CM | POA: Diagnosis not present

## 2023-06-27 MED ORDER — SKYRIZI PEN 150 MG/ML ~~LOC~~ SOAJ
SUBCUTANEOUS | 3 refills | Status: DC
  Filled 2023-06-27: qty 1, 84d supply, fill #0

## 2023-06-27 MED ORDER — CLOBETASOL PROPIONATE 0.05 % EX OINT
TOPICAL_OINTMENT | CUTANEOUS | 2 refills | Status: AC
  Filled 2023-06-27: qty 60, 30d supply, fill #0

## 2023-06-28 ENCOUNTER — Other Ambulatory Visit (INDEPENDENT_AMBULATORY_CARE_PROVIDER_SITE_OTHER): Payer: PPO

## 2023-06-28 ENCOUNTER — Other Ambulatory Visit: Payer: Self-pay

## 2023-06-28 DIAGNOSIS — E1169 Type 2 diabetes mellitus with other specified complication: Secondary | ICD-10-CM | POA: Diagnosis not present

## 2023-06-29 ENCOUNTER — Other Ambulatory Visit: Payer: Self-pay | Admitting: Family Medicine

## 2023-06-29 ENCOUNTER — Ambulatory Visit: Payer: PPO

## 2023-06-29 DIAGNOSIS — R2689 Other abnormalities of gait and mobility: Secondary | ICD-10-CM

## 2023-06-29 DIAGNOSIS — R748 Abnormal levels of other serum enzymes: Secondary | ICD-10-CM

## 2023-06-29 DIAGNOSIS — M25662 Stiffness of left knee, not elsewhere classified: Secondary | ICD-10-CM

## 2023-06-29 DIAGNOSIS — M6281 Muscle weakness (generalized): Secondary | ICD-10-CM

## 2023-06-29 LAB — LIPID PANEL
Cholesterol: 152 mg/dL (ref 0–200)
HDL: 49.1 mg/dL (ref >39.00)
LDL Cholesterol: 76 mg/dL (ref 0–99)
NonHDL: 103.25
Total CHOL/HDL Ratio: 3
Triglycerides: 137 mg/dL (ref 0.0–149.0)
VLDL: 27.4 mg/dL (ref 0.0–40.0)

## 2023-06-29 LAB — COMPREHENSIVE METABOLIC PANEL
ALT: 10 U/L (ref 0–35)
AST: 13 U/L (ref 0–37)
Albumin: 4 g/dL (ref 3.5–5.2)
Alkaline Phosphatase: 127 U/L — ABNORMAL HIGH (ref 39–117)
BUN: 8 mg/dL (ref 6–23)
CO2: 30 meq/L (ref 19–32)
Calcium: 9.1 mg/dL (ref 8.4–10.5)
Chloride: 102 meq/L (ref 96–112)
Creatinine, Ser: 0.89 mg/dL (ref 0.40–1.20)
GFR: 68.19 mL/min (ref >60.00)
Glucose, Bld: 78 mg/dL (ref 70–99)
Potassium: 3.7 meq/L (ref 3.5–5.1)
Sodium: 138 meq/L (ref 135–145)
Total Bilirubin: 0.5 mg/dL (ref 0.2–1.2)
Total Protein: 7.6 g/dL (ref 6.0–8.3)

## 2023-06-29 LAB — MICROALBUMIN / CREATININE URINE RATIO
Creatinine,U: 91.3 mg/dL
Microalb Creat Ratio: 0.8 mg/g (ref 0.0–30.0)
Microalb, Ur: <0.7 mg/dL (ref 0.0–1.9)

## 2023-06-29 LAB — HEMOGLOBIN A1C: Hgb A1c MFr Bld: 5.5 % (ref 4.6–6.5)

## 2023-06-29 NOTE — Therapy (Addendum)
OUTPATIENT PHYSICAL THERAPY TREATMENT/DISCHARGE   Patient Name: Monica [name] MRN: [id number] DOB:09/24/1955, 67 y.o., female Today's Date: 06/29/2023  END OF SESSION:  PT End of Session - 06/29/23 1453     Visit Number 5    Number of Visits 12    Date for PT Re-Evaluation 07/31/23    Authorization Type HTA    PT Start Time 1450    PT Stop Time 1530    PT Time Calculation (min) 40 min    Activity Tolerance Patient tolerated treatment well    Behavior During Therapy [facility] for tasks assessed/performed                 Past Medical History:  Diagnosis Date   Allergic conjunctivitis 03/12/2015   Anxiety    Atrial fibrillation (HCC)    a. s/p DCCV in 12/2012 b. 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   History of AVN   Hypertension    IFG (impaired fasting glucose) 10/14/2013   Itchy eyes 03/12/2015   Morbid obesity (HCC)    Osteoarthritis    Personal history of colonic polyps - large hyperplastic 12/25/2013   Pre-diabetes    Psoriasis    active breakout left buttocks   Right hip pain    Right knee pain 07/06/2016   Skin infection 10/14/2013   Sleep apnea    uses mouth guard only   Swelling of limb 11/30/2012   Tachycardia, unspecified    Past Surgical History:  Procedure Laterality Date   CARDIOVERSION N/A 01/09/2013   Procedure: CARDIOVERSION;  Surgeon: Lewayne Bunting, MD;  Location: [facility] ENDOSCOPY;  Service: Cardiovascular;  Laterality: N/A;   CARDIOVERSION N/A 10/07/2014   Procedure: CARDIOVERSION;  Surgeon: Lewayne Bunting, MD;  Location: [facility] ENDOSCOPY;  Service: Cardiovascular;  Laterality: N/A;  09:00 Lido 60mg ,IV followed by  Propofol  70mg /IV    synched electrocardioversion at 120 joules for Afib,repeated at 200 joules, 70 mg...successfully changed to SR   CARDIOVERSION N/A 12/22/2021   Procedure: CARDIOVERSION;  Surgeon: Little Ishikawa, MD;  Location: [facility] ENDOSCOPY;  Service: Cardiovascular;  Laterality: N/A;   FRACTURE SURGERY  07/21/12   right hip arthroplasty   JOINT REPLACEMENT     Left hip Dr. Magnus Ivan 04-07-18   ORIF PATELLA Left 03/28/2023   Procedure: OPEN REDUCTION INTERNAL FIXATION (ORIF) PATELLA;  Surgeon: Ollen Gross, MD;  Location: [facility] ORS;  Service: Orthopedics;  Laterality: Left;   TOTAL HIP ARTHROPLASTY  07/21/2012   Procedure: TOTAL HIP ARTHROPLASTY;  Surgeon: Loanne Drilling, MD;  Location: [facility] ORS;  Service: Orthopedics;  Laterality: Right;   TOTAL HIP ARTHROPLASTY Left 04/07/2018   Procedure: LEFT TOTAL HIP ARTHROPLASTY ANTERIOR APPROACH;  Surgeon: Kathryne Hitch, MD;  Location: [facility] ORS;  Service: Orthopedics;  Laterality: Left;  Needs RNFA   Patient Active Problem List   Diagnosis Date Noted   GAD (generalized anxiety disorder) 06/21/2023   Left patella fracture 03/28/2023   Encounter for annual physical exam 04/04/2022   Hypercoagulable state due to longstanding persistent atrial fibrillation (HCC) 04/04/2022   Depression 09/10/2021   Vitamin D deficiency 09/10/2021   DM2 (diabetes mellitus, type 2) (HCC)  09/10/2021   Status post left hip replacement 04/07/2018   Unilateral primary osteoarthritis, left hip 02/23/2018   Psoriatic arthropathy (HCC) 11/17/2017   Tinnitus 11/19/2016   Left hip pain 10/21/2015   Gastroesophageal reflux disease 09/19/2015   OSA (obstructive sleep apnea) 02/20/2015   Morbid obesity (HCC) 01/15/2015   Lymphedema 06/19/2014   History of colonic polyps 12/25/2013   Carpal tunnel syndrome 10/14/2013   Allergic rhinitis 06/20/2013   Edema 12/20/2012   Avascular necrosis of hip (HCC) 07/21/2012   Atrial fibrillation (HCC) 07/06/2012   Essential  hypertension 09/14/2011   Psoriasis 08/16/2005    PCP: Sharlene Dory, DO   REFERRING PROVIDER: Eartha Inch, PA   REFERRING DIAG: Z51.89 (ICD-10-CM) - Encounter for other specified aftercare; L patellar fracture on 03/16/23, surgery 03/28/23  THERAPY DIAG:  Other abnormalities of gait and mobility  Muscle weakness (generalized)  Decreased range of motion (ROM) of left knee  Rationale for Evaluation and Treatment: Rehabilitation  ONSET DATE: 03/16/23, surgery 03/28/23  SUBJECTIVE:   SUBJECTIVE STATEMENT:  Saw MD , doing well in his eyes, needs to work on strengthening tasks and stair negotiation.  PERTINENT HISTORY: None available PAIN:  Are you having pain?  Yes: NPRS scale: (soreness vs pain) 4/10 Pain location: L knee Pain description: ache, stiffness Aggravating factors: movement Relieving factors: rest  PRECAUTIONS: None  RED FLAGS: None   WEIGHT BEARING RESTRICTIONS: No  FALLS:  Has patient fallen in last 6 months? Yes. Number of falls 1  OCCUPATION: retired  PLOF: Independent  PATIENT GOALS: to walk and go up/down stairs again  NEXT MD VISIT: 11/24  OBJECTIVE:  Note: Objective measures were completed at Evaluation unless otherwise noted.  DIAGNOSTIC FINDINGS: IMPRESSION: 1. Transverse fracture of the inferior aspect of the patella. There is approximately 1.7 cm superior migration of the dominant more superior aspect of the patella. 2. Small joint effusion. 3. Mild-to-moderate medial compartment osteoarthritis.     Electronically Signed   By: Neita Garnet M.D.   On: 03/16/2023 16:01  PATIENT SURVEYS:  FOTO 38(63 predicted)  MUSCLE LENGTH: Hamstrings: not tested   POSTURE: No Significant postural limitations  LOWER EXTREMITY ROM:  A/PROM Right eval Left eval L 06/08/23 L 06/17/23  Hip flexion  90d    Hip extension      Hip abduction      Hip adduction      Hip internal rotation      Hip external rotation       Knee flexion  50/60d 92/95d /100d   Knee extension  -2d    Ankle dorsiflexion  5d    Ankle plantarflexion      Ankle inversion      Ankle eversion       (Blank rows = not tested)  LOWER EXTREMITY MMT:  MMT Right eval Left eval  Hip flexion  3+  Hip extension  3+  Hip abduction  3+  Hip adduction    Hip internal rotation    Hip external rotation    Knee flexion  3-  Knee extension  3-  Ankle dorsiflexion    Ankle plantarflexion  3+  Ankle inversion    Ankle eversion     (Blank rows = not tested)  LOWER EXTREMITY SPECIAL TESTS:  deferred  FUNCTIONAL TESTS:  30 seconds chair stand test 1(limited due to apprehension)  GAIT: Distance walked: 33ft x2 Assistive device utilized: Single point cane Level of assistance: Complete Independence Comments: antalgic gait    TODAY'S  TREATMENT:   [facility] Adult PT Treatment:                                                DATE: 06/29/23 Therapeutic Exercise: Nustep L4 6 min seat 11 FAQs with towel roll leverage 3# 2s hold 15x2 SAQs 3# 15x2 Heel slides over board with strap 15x 106d  Heel raises 15x UE support over 4 in block 4 in step up/down 10x with BUE support Omega leg press 20# 15x  [facility] Adult PT Treatment:                                                DATE: 06/17/23 Therapeutic Exercise: Nustep L2 6 min seat 11 FAQs with towel roll leverage 2# 2s hold 15x2 SAQs 2# 15x2 Heel slides over board with strap 15x Heel raises 15x UE support Toe raises UE support 15x Standing hamstring curl L 15x UE support  Therapeutic Activity: Review of 4 steps with cane and single rail, step to pattern  [facility] Adult PT Treatment:                                                DATE: 06/15/23 Therapeutic Exercise: Supine QS x 10- 5" hold Supine SLR x 10 L - difficult  SAQ 2x15 R Supine L heel slide 2x10 FAQs L x 20 2s hold Gait Training: Stair navigation up/down 5 steps with bilateral UE support; step-to pattern with L in stance during  descending   [facility] Adult PT Treatment:                                                DATE: 06/08/23 Therapeutic Exercise: FAQs L 15x 2s hold Heel slides over towel in sitting with strap 15x 2s hold SAQs 15x  Heel slides with strap 15x 95d 5x STS Heel raises with UE support 15x Toe raises 15x with UE support 15x Manual Therapy: L patellar/scar mobilization  PATIENT EDUCATION:  Education details: Discussed eval findings, rehab rationale and POC and patient is in agreement  Person educated: Patient Education method: Explanation Education comprehension: verbalized understanding and needs further education  HOME EXERCISE PROGRAM: Access Code: [email or web address] URL: [email or web address] Date: 05/31/2023 Prepared by: Gustavus Bryant  Exercises - Supine Quad Set  - 5 x daily - 5 x weekly - 1 sets - 10 reps - 3s hold - Supine Heel Slide with Strap  - 5 x daily - 5 x weekly - 1 sets - 10 reps - Seated Heel Slide  - 5 x daily - 5 x weekly - 1 sets - 10 reps - Seated Long Arc Quad  - 5 x daily - 5 x weekly - 1 sets - 10 reps  ASSESSMENT:  CLINICAL IMPRESSION: Increased resistance on aerobic tasks and continued to apply progressive resistance to patellar tendon and L quad.  Transitioned to CKC tasks with apprehension noted.  Able to complete session w/o symptom flare-up.  (Eval)Patient is a  67 y.o. female who was seen today for physical therapy evaluation and treatment for L patellar fracture and ORIF resulting in pain, weakness, loss of ROM and decreased functional mobility. Patient able to generate a fair quad set and perform heel slides w/o exacerbation of symptoms.  Extension ROM is good but flexion limited to 60d PROM due to stiffness and apprehension. Patient fearful of activity due to possibility of injury.  OBJECTIVE IMPAIRMENTS: Abnormal gait, decreased activity tolerance, decreased balance, decreased endurance, decreased knowledge of condition, decreased mobility, difficulty  walking, decreased ROM, decreased strength, obesity, and pain.   ACTIVITY LIMITATIONS: carrying, lifting, bending, sitting, standing, squatting, stairs, and locomotion level  PERSONAL FACTORS: Age, Fitness, and 1 comorbidity: DM  are also affecting patient's functional outcome.   REHAB POTENTIAL: Good  CLINICAL DECISION MAKING: Evolving/moderate complexity  EVALUATION COMPLEXITY: Low   GOALS: Goals reviewed with patient? No  SHORT TERM GOALS: Target date: 06/21/2023   Patient to demonstrate independence in HEP  Baseline: [id number] Goal status: Met  2.  244ft ambulation with LRAD Baseline: 33ft with cane; 06/29/23 223ft + outside of clinic Goal status: Met  3.  90d PROM L knee flexion Baseline: 60d; 08/16/22 100d Goal status: Met   LONG TERM GOALS: Target date: 07/12/2023    Patient will score at least 63% on FOTO to signify clinically meaningful improvement in functional abilities.   Baseline: 38% Goal status: INITIAL  2.  Able to negotiate 16 steps with most appropriate pattern Baseline: TBD Goal status: INITIAL  3.  110d L knee flexion Baseline: 60d Goal status: INITIAL  4.  Increase LLE strength to 4/5 Baseline:  MMT Right eval Left eval  Hip flexion  3+  Hip extension  3+  Hip abduction  3+  Hip adduction    Hip internal rotation    Hip external rotation    Knee flexion  3-  Knee extension  3-  Ankle dorsiflexion    Ankle plantarflexion  3+   Goal status: INITIAL  5.  Decrease level of discomfort/soreness to 2/10 Baseline: 4/10 Goal status: INITIAL   PLAN:  PT FREQUENCY: 2x/week  PT DURATION: 6 weeks  PLANNED INTERVENTIONS: 97164- PT Re-evaluation, 97110-Therapeutic exercises, 97530- Therapeutic activity, 97112- Neuromuscular re-education, 97535- Self Care, 96295- Manual therapy, [id number]- Gait training, [phone]- Aquatic Therapy, 97014- Electrical stimulation (unattended), Stair training, Dry Needling, Joint mobilization, DME instructions,  Cryotherapy, and Moist heat  PLAN FOR NEXT SESSION: HEP review and update, manual techniques as appropriate, aerobic tasks, ROM and flexibility activities, strengthening and PREs, TPDN, gait and balance training as needed     Hildred Laser, PT 06/29/2023, 3:54 PM

## 2023-07-07 ENCOUNTER — Other Ambulatory Visit (HOSPITAL_BASED_OUTPATIENT_CLINIC_OR_DEPARTMENT_OTHER): Payer: Self-pay

## 2023-07-07 ENCOUNTER — Other Ambulatory Visit: Payer: Self-pay

## 2023-07-08 ENCOUNTER — Other Ambulatory Visit (HOSPITAL_BASED_OUTPATIENT_CLINIC_OR_DEPARTMENT_OTHER): Payer: Self-pay

## 2023-07-08 ENCOUNTER — Telehealth: Payer: Self-pay

## 2023-07-08 NOTE — Telephone Encounter (Signed)
PA initiated via Covermymeds; KEY: [id number].   PA approved.   22-NOV-24:22-NOV-25 Trulicity 3MG /0.5ML [place] SOAJ Quantity:2;

## 2023-07-11 ENCOUNTER — Other Ambulatory Visit (HOSPITAL_BASED_OUTPATIENT_CLINIC_OR_DEPARTMENT_OTHER): Payer: Self-pay

## 2023-07-11 MED ORDER — SHINGRIX 50 MCG/0.5ML IM SUSR
0.5000 mL | Freq: Once | INTRAMUSCULAR | 0 refills | Status: DC
  Filled 2023-07-11: qty 0.5, 1d supply, fill #0

## 2023-07-19 ENCOUNTER — Other Ambulatory Visit: Payer: PPO

## 2023-07-19 ENCOUNTER — Encounter (HOSPITAL_COMMUNITY): Payer: Self-pay

## 2023-07-19 ENCOUNTER — Other Ambulatory Visit: Payer: Self-pay

## 2023-07-19 NOTE — Progress Notes (Signed)
Specialty Pharmacy Ongoing Clinical Assessment Note  Monica [name] is a 67 y.o. female who is being followed by the specialty pharmacy service for RxSp Psoriasis   Patient's specialty medication(s) reviewed today: Risankizumab-Rzaa (Antipsoriatics)   Missed doses in the last 4 weeks: 0   Patient/Caregiver did not have any additional questions or concerns.   Therapeutic benefit summary: Patient is achieving benefit   Adverse events/side effects summary: No adverse events/side effects   Patient's therapy is appropriate to: Continue    Goals Addressed             This Visit's Progress    Reduce signs and symptoms       Patient is on track. Patient will maintain adherence         Follow up:  6 months  Otto Herb Specialty Pharmacist

## 2023-07-19 NOTE — Progress Notes (Signed)
Specialty Pharmacy Refill Coordination Note  Monica [name] is a 67 y.o. female contacted today regarding refills of specialty medication(s) Risankizumab-Rzaa (Antipsoriatics)   Patient requested Delivery   Delivery date: 08/02/23   Verified address: [address]  [facility] [place] [address]   Medication will be filled on 08/01/23.

## 2023-07-28 DIAGNOSIS — Z5189 Encounter for other specified aftercare: Secondary | ICD-10-CM | POA: Diagnosis not present

## 2023-08-01 ENCOUNTER — Other Ambulatory Visit: Payer: Self-pay

## 2023-08-03 ENCOUNTER — Other Ambulatory Visit (HOSPITAL_BASED_OUTPATIENT_CLINIC_OR_DEPARTMENT_OTHER): Payer: Self-pay

## 2023-08-07 ENCOUNTER — Encounter: Payer: Self-pay | Admitting: Pharmacist

## 2023-08-07 NOTE — Progress Notes (Signed)
Pharmacy Quality Measure Review  This patient is appearing on a report for being at risk of failing the adherence measure for diabetes medications this calendar year.   Medication: Trulicity 3 mg Last fill date: 12/19 for 28 day supply  Insurance report was not up to date. No action needed at this time.   Jarrett Ables, PharmD PGY-1 Pharmacy Resident

## 2023-08-15 ENCOUNTER — Other Ambulatory Visit (HOSPITAL_BASED_OUTPATIENT_CLINIC_OR_DEPARTMENT_OTHER): Payer: Self-pay

## 2023-08-19 ENCOUNTER — Other Ambulatory Visit (HOSPITAL_BASED_OUTPATIENT_CLINIC_OR_DEPARTMENT_OTHER): Payer: Self-pay

## 2023-08-19 ENCOUNTER — Other Ambulatory Visit: Payer: Self-pay | Admitting: Adult Health

## 2023-08-19 ENCOUNTER — Other Ambulatory Visit: Payer: Self-pay | Admitting: Family Medicine

## 2023-08-19 DIAGNOSIS — F411 Generalized anxiety disorder: Secondary | ICD-10-CM

## 2023-08-19 DIAGNOSIS — I4819 Other persistent atrial fibrillation: Secondary | ICD-10-CM

## 2023-08-19 MED ORDER — SERTRALINE HCL 50 MG PO TABS
75.0000 mg | ORAL_TABLET | Freq: Every day | ORAL | 0 refills | Status: DC
  Filled 2023-08-19: qty 135, 90d supply, fill #0

## 2023-08-19 MED ORDER — FLECAINIDE ACETATE 100 MG PO TABS
100.0000 mg | ORAL_TABLET | Freq: Two times a day (BID) | ORAL | 2 refills | Status: DC
  Filled 2023-08-19: qty 180, 90d supply, fill #0
  Filled 2023-11-24: qty 180, 90d supply, fill #1
  Filled 2023-11-28: qty 180, 90d supply, fill #0
  Filled 2024-02-22: qty 180, 90d supply, fill #1
  Filled 2024-06-16: qty 8, 4d supply, fill #2

## 2023-08-25 ENCOUNTER — Other Ambulatory Visit (INDEPENDENT_AMBULATORY_CARE_PROVIDER_SITE_OTHER): Payer: PPO

## 2023-08-25 ENCOUNTER — Other Ambulatory Visit (HOSPITAL_COMMUNITY): Payer: Self-pay

## 2023-08-25 DIAGNOSIS — R748 Abnormal levels of other serum enzymes: Secondary | ICD-10-CM

## 2023-08-26 LAB — HEPATIC FUNCTION PANEL
ALT: 13 U/L (ref 0–35)
AST: 13 U/L (ref 0–37)
Albumin: 4.1 g/dL (ref 3.5–5.2)
Alkaline Phosphatase: 127 U/L — ABNORMAL HIGH (ref 39–117)
Bilirubin, Direct: 0.1 mg/dL (ref 0.0–0.3)
Total Bilirubin: 0.6 mg/dL (ref 0.2–1.2)
Total Protein: 7.4 g/dL (ref 6.0–8.3)

## 2023-08-26 LAB — GAMMA GT: GGT: 14 U/L (ref 7–51)

## 2023-08-29 LAB — ALKALINE PHOSPHATASE ISOENZYMES
Alkaline phosphatase (APISO): 118 U/L (ref 37–153)
Bone Isoenzymes: 28 % (ref 28–66)
Intestinal Isoenzymes: 0 % — ABNORMAL LOW (ref 1–24)
Liver Isoenzymes: 72 % — ABNORMAL HIGH (ref 25–69)
Macrohepatic isoenzymes: 0 % (ref <=0)
Placental isoenzymes: 0 % (ref <=0)

## 2023-08-30 ENCOUNTER — Other Ambulatory Visit: Payer: Self-pay | Admitting: Family Medicine

## 2023-08-30 DIAGNOSIS — R748 Abnormal levels of other serum enzymes: Secondary | ICD-10-CM

## 2023-08-31 ENCOUNTER — Encounter: Payer: Self-pay | Admitting: Family Medicine

## 2023-08-31 ENCOUNTER — Ambulatory Visit (INDEPENDENT_AMBULATORY_CARE_PROVIDER_SITE_OTHER): Payer: Commercial Managed Care - PPO | Admitting: Family Medicine

## 2023-08-31 VITALS — BP 126/76 | HR 72 | Temp 98.0°F | Resp 16 | Ht 65.0 in | Wt 258.6 lb

## 2023-08-31 DIAGNOSIS — M542 Cervicalgia: Secondary | ICD-10-CM

## 2023-08-31 NOTE — Patient Instructions (Addendum)
 Heat (pad or rice pillow in microwave) over affected area, 10-15 minutes twice daily.   Ice/cold pack over area for 10-15 min twice daily.  OK to take Tylenol  1000 mg (2 extra strength tabs) or 975 mg (3 regular strength tabs) every 6 hours as needed.  OK to continue Voltaren  gel.   Let us  know if you need anything.  EXERCISES RANGE OF MOTION (ROM) AND STRETCHING EXERCISES  These exercises may help you when beginning to rehabilitate your issue. In order to successfully resolve your symptoms, you must improve your posture. These exercises are designed to help reduce the forward-head and rounded-shoulder posture which contributes to this condition. Your symptoms may resolve with or without further involvement from your physician, physical therapist or athletic trainer. While completing these exercises, remember:  Restoring tissue flexibility helps normal motion to return to the joints. This allows healthier, less painful movement and activity. An effective stretch should be held for at least 20 seconds, although you may need to begin with shorter hold times for comfort. A stretch should never be painful. You should only feel a gentle lengthening or release in the stretched tissue. Do not do any stretch or exercise that you cannot tolerate.  STRETCH- Axial Extensors Lie on your back on the floor. You may bend your knees for comfort. Place a rolled-up hand towel or dish towel, about 2 inches in diameter, under the part of your head that makes contact with the floor. Gently tuck your chin, as if trying to make a "double chin," until you feel a gentle stretch at the base of your head. Hold 15-20 seconds. Repeat 2-3 times. Complete this exercise 1 time per day.   STRETCH - Axial Extension  Stand or sit on a firm surface. Assume a good posture: chest up, shoulders drawn back, abdominal muscles slightly tense, knees unlocked (if standing) and feet hip width apart. Slowly retract your chin so your  head slides back and your chin slightly lowers. Continue to look straight ahead. You should feel a gentle stretch in the back of your head. Be certain not to feel an aggressive stretch since this can cause headaches later. Hold for 15-20 seconds. Repeat 2-3 times. Complete this exercise 1 time per day.  STRETCH - Cervical Side Bend  Stand or sit on a firm surface. Assume a good posture: chest up, shoulders drawn back, abdominal muscles slightly tense, knees unlocked (if standing) and feet hip width apart. Without letting your nose or shoulders move, slowly tip your right / left ear to your shoulder until your feel a gentle stretch in the muscles on the opposite side of your neck. Hold 15-20 seconds. Repeat 2-3 times. Complete this exercise 1-2 times per day.  STRETCH - Cervical Rotators  Stand or sit on a firm surface. Assume a good posture: chest up, shoulders drawn back, abdominal muscles slightly tense, knees unlocked (if standing) and feet hip width apart. Keeping your eyes level with the ground, slowly turn your head until you feel a gentle stretch along the back and opposite side of your neck. Hold 15-20 seconds. Repeat 2-3 times. Complete this exercise 1-2 times per day.  RANGE OF MOTION - Neck Circles  Stand or sit on a firm surface. Assume a good posture: chest up, shoulders drawn back, abdominal muscles slightly tense, knees unlocked (if standing) and feet hip width apart. Gently roll your head down and around from the back of one shoulder to the back of the other. The motion should never  be forced or painful. Repeat the motion 10-20 times, or until you feel the neck muscles relax and loosen. Repeat 2-3 times. Complete the exercise 1-2 times per day. STRENGTHENING EXERCISES - Cervical Strain and Sprain These exercises may help you when beginning to rehabilitate your injury. They may resolve your symptoms with or without further involvement from your physician, physical therapist, or  athletic trainer. While completing these exercises, remember:  Muscles can gain both the endurance and the strength needed for everyday activities through controlled exercises. Complete these exercises as instructed by your physician, physical therapist, or athletic trainer. Progress the resistance and repetitions only as guided. You may experience muscle soreness or fatigue, but the pain or discomfort you are trying to eliminate should never worsen during these exercises. If this pain does worsen, stop and make certain you are following the directions exactly. If the pain is still present after adjustments, discontinue the exercise until you can discuss the trouble with your clinician.  STRENGTH - Cervical Flexors, Isometric Face a wall, standing about 6 inches away. Place a small pillow, a ball about 6-8 inches in diameter, or a folded towel between your forehead and the wall. Slightly tuck your chin and gently push your forehead into the soft object. Push only with mild to moderate intensity, building up tension gradually. Keep your jaw and forehead relaxed. Hold 10 to 20 seconds. Keep your breathing relaxed. Release the tension slowly. Relax your neck muscles completely before you start the next repetition. Repeat 2-3 times. Complete this exercise 1 time per day.  STRENGTH- Cervical Lateral Flexors, Isometric  Stand about 6 inches away from a wall. Place a small pillow, a ball about 6-8 inches in diameter, or a folded towel between the side of your head and the wall. Slightly tuck your chin and gently tilt your head into the soft object. Push only with mild to moderate intensity, building up tension gradually. Keep your jaw and forehead relaxed. Hold 10 to 20 seconds. Keep your breathing relaxed. Release the tension slowly. Relax your neck muscles completely before you start the next repetition. Repeat 2-3 times. Complete this exercise 1 time per day.  STRENGTH - Cervical Extensors, Isometric   Stand about 6 inches away from a wall. Place a small pillow, a ball about 6-8 inches in diameter, or a folded towel between the back of your head and the wall. Slightly tuck your chin and gently tilt your head back into the soft object. Push only with mild to moderate intensity, building up tension gradually. Keep your jaw and forehead relaxed. Hold 10 to 20 seconds. Keep your breathing relaxed. Release the tension slowly. Relax your neck muscles completely before you start the next repetition. Repeat 2-3 times. Complete this exercise 1 time per day.  POSTURE AND BODY MECHANICS CONSIDERATIONS Keeping correct posture when sitting, standing or completing your activities will reduce the stress put on different body tissues, allowing injured tissues a chance to heal and limiting painful experiences. The following are general guidelines for improved posture. Your physician or physical therapist will provide you with any instructions specific to your needs. While reading these guidelines, remember: The exercises prescribed by your provider will help you have the flexibility and strength to maintain correct postures. The correct posture provides the optimal environment for your joints to work. All of your joints have less wear and tear when properly supported by a spine with good posture. This means you will experience a healthier, less painful body. Correct posture must be  practiced with all of your activities, especially prolonged sitting and standing. Correct posture is as important when doing repetitive low-stress activities (typing) as it is when doing a single heavy-load activity (lifting).  PROLONGED STANDING WHILE SLIGHTLY LEANING FORWARD When completing a task that requires you to lean forward while standing in one place for a long time, place either foot up on a stationary 2- to 4-inch high object to help maintain the best posture. When both feet are on the ground, the low back tends to lose its  slight inward curve. If this curve flattens (or becomes too large), then the back and your other joints will experience too much stress, fatigue more quickly, and can cause pain.   RESTING POSITIONS Consider which positions are most painful for you when choosing a resting position. If you have pain with flexion-based activities (sitting, bending, stooping, squatting), choose a position that allows you to rest in a less flexed posture. You would want to avoid curling into a fetal position on your side. If your pain worsens with extension-based activities (prolonged standing, working overhead), avoid resting in an extended position such as sleeping on your stomach. Most people will find more comfort when they rest with their spine in a more neutral position, neither too rounded nor too arched. Lying on a non-sagging bed on your side with a pillow between your knees, or on your back with a pillow under your knees will often provide some relief. Keep in mind, being in any one position for a prolonged period of time, no matter how correct your posture, can still lead to stiffness.  WALKING Walk with an upright posture. Your ears, shoulders, and hips should all line up. OFFICE WORK When working at a desk, create an environment that supports good, upright posture. Without extra support, muscles fatigue and lead to excessive strain on joints and other tissues.  CHAIR: A chair should be able to slide under your desk when your back makes contact with the back of the chair. This allows you to work closely. The chair's height should allow your eyes to be level with the upper part of your monitor and your hands to be slightly lower than your elbows. Body position: Your feet should make contact with the floor. If this is not possible, use a foot rest. Keep your ears over your shoulders. This will reduce stress on your neck and low back.

## 2023-08-31 NOTE — Progress Notes (Signed)
 Musculoskeletal Exam  Patient: Monica [name] DOB: August 30, 1955  DOS: 08/31/2023  SUBJECTIVE:  Chief Complaint:   Chief Complaint  Patient presents with   Neck Pain    Neck pain    Monica [name] is a 68 y.o.  female for evaluation and treatment of L neck pain.   Onset:  1 week ago. No inj or change in activity.  Location: L lateral neck Character:  sharp  Progression of issue:  is unchanged Associated symptoms:  No bruising, redness, swelling Treatment: to date has been muscle relaxers and Voltaren  gel.   Neurovascular symptoms: no  Past Medical History:  Diagnosis Date   Allergic conjunctivitis 03/12/2015   Anxiety    Atrial fibrillation (HCC)    a. s/p DCCV in 12/2012 b. 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   History of AVN   Hypertension    IFG (impaired fasting glucose) 10/14/2013   Itchy eyes 03/12/2015   Morbid obesity (HCC)    Osteoarthritis    Personal history of colonic polyps - large hyperplastic 12/25/2013   Pre-diabetes    Psoriasis    active breakout left buttocks   Right hip pain    Right knee pain 07/06/2016   Skin infection 10/14/2013   Sleep apnea    uses mouth guard only   Swelling of limb 11/30/2012   Tachycardia, unspecified     Objective: VITAL SIGNS: BP 126/76   Pulse 72   Temp 98 F (36.7 C) (Oral)   Resp 16   Ht 5\' 5"  (1.651 m)   Wt 258 lb 9.6 oz (117.3 kg)   LMP 06/19/2013   SpO2 98%   BMI 43.03 kg/m  Constitutional: Well formed, well developed. No acute distress. Thorax & Lungs: No accessory muscle use Musculoskeletal: neck.   Normal active range of motion: yes. Some decreased extension 2/2  pain  Tenderness to palpation: Yes over the posterior lateral musculature of the neck Deformity: no Ecchymosis: no Tests positive: none  Tests negative: Spurling's Neurologic: Normal sensory function. No focal deficits noted. DTR's equal and symmetric in UE's. No clonus.  Grip strength adequate. Psychiatric: Normal mood. Age appropriate judgment and insight. Alert & oriented x 3.    Assessment:  Neck pain  Plan: Continue Voltaren  gel and Zanaflex  as needed.  Stretches/exercises, heat, ice, Tylenol .  Physical therapy if no improvement. F/u as originally scheduled. The patient voiced understanding and agreement to the plan.   Monica Dials [place], DO 08/31/23  3:29 PM

## 2023-09-06 ENCOUNTER — Encounter: Payer: Self-pay | Admitting: Family Medicine

## 2023-09-06 ENCOUNTER — Ambulatory Visit (HOSPITAL_BASED_OUTPATIENT_CLINIC_OR_DEPARTMENT_OTHER)
Admission: RE | Admit: 2023-09-06 | Discharge: 2023-09-06 | Disposition: A | Discharge status: Home / Self Care | Payer: PPO | Source: Ambulatory Visit | Attending: Family Medicine | Admitting: Family Medicine

## 2023-09-06 DIAGNOSIS — R748 Abnormal levels of other serum enzymes: Secondary | ICD-10-CM | POA: Insufficient documentation

## 2023-09-06 DIAGNOSIS — R7989 Other specified abnormal findings of blood chemistry: Secondary | ICD-10-CM | POA: Diagnosis not present

## 2023-10-06 ENCOUNTER — Other Ambulatory Visit: Payer: Self-pay | Admitting: Family Medicine

## 2023-10-06 ENCOUNTER — Other Ambulatory Visit (HOSPITAL_COMMUNITY): Payer: Self-pay

## 2023-10-06 ENCOUNTER — Other Ambulatory Visit: Payer: Self-pay

## 2023-10-06 DIAGNOSIS — E1169 Type 2 diabetes mellitus with other specified complication: Secondary | ICD-10-CM

## 2023-10-06 DIAGNOSIS — R609 Edema, unspecified: Secondary | ICD-10-CM

## 2023-10-06 MED ORDER — HYDROCHLOROTHIAZIDE 25 MG PO TABS
25.0000 mg | ORAL_TABLET | Freq: Every morning | ORAL | 3 refills | Status: DC
  Filled 2023-10-06: qty 90, 90d supply, fill #0
  Filled 2024-01-30: qty 90, 90d supply, fill #1

## 2023-10-06 MED ORDER — TRULICITY 3 MG/0.5ML ~~LOC~~ SOAJ
3.0000 mg | SUBCUTANEOUS | 3 refills | Status: DC
  Filled 2023-10-06: qty 6, 84d supply, fill #0
  Filled 2024-01-11: qty 6, 84d supply, fill #1
  Filled 2024-04-12: qty 6, 84d supply, fill #2

## 2023-10-10 ENCOUNTER — Other Ambulatory Visit: Payer: Self-pay

## 2023-10-10 ENCOUNTER — Other Ambulatory Visit (HOSPITAL_COMMUNITY): Payer: Self-pay

## 2023-10-11 ENCOUNTER — Other Ambulatory Visit: Payer: Self-pay

## 2023-10-13 ENCOUNTER — Other Ambulatory Visit: Payer: Self-pay

## 2023-10-17 ENCOUNTER — Other Ambulatory Visit: Payer: Self-pay

## 2023-10-17 ENCOUNTER — Other Ambulatory Visit (HOSPITAL_COMMUNITY): Payer: Self-pay

## 2023-10-17 NOTE — Progress Notes (Signed)
 Specialty Pharmacy Refill Coordination Note  Monica [name] is a 68 y.o. female contacted today regarding refills of specialty medication(s) No data recorded  Patient requested (Patient-Rptd) Delivery   Delivery date: (Patient-Rptd) 11/09/23   Verified address: (Patient-Rptd) [address] [place], [place]. [address]   Medication will be filled on 11/08/23.

## 2023-10-28 ENCOUNTER — Other Ambulatory Visit (HOSPITAL_COMMUNITY): Payer: Self-pay

## 2023-10-31 ENCOUNTER — Other Ambulatory Visit: Payer: Self-pay

## 2023-10-31 ENCOUNTER — Encounter: Payer: Self-pay | Admitting: Family Medicine

## 2023-10-31 ENCOUNTER — Ambulatory Visit: Payer: Self-pay | Admitting: Family Medicine

## 2023-10-31 ENCOUNTER — Encounter: Payer: Self-pay | Admitting: Physician Assistant

## 2023-10-31 NOTE — Telephone Encounter (Signed)
 Copied from CRM [id number]. Topic: Clinical - Red Word Triage >> Oct 31, 2023  2:50 PM Chantha C wrote: Red Word that prompted transfer to Nurse Triage: Patient wants an appointment to discuss menopause symptoms, hot flashes, lightheaded and feels like throw up and fainting, wants to get blood work. Patient states she spoke with Dr. Carmelia Roller about this but symptoms have worse. Please advise [phone].  WOULD LIKE TO HAVE BLOOD TEST TO DETERMINE IF SHE IS IN MENOPAUSE.  Pcp office updated and to call patient back.   Reason for Disposition  Nursing judgment  Protocols used: No Guideline or Reference Available-[facility]

## 2023-11-01 NOTE — Telephone Encounter (Signed)
 Does pt need a office visit ?

## 2023-11-04 ENCOUNTER — Other Ambulatory Visit (HOSPITAL_COMMUNITY): Payer: Self-pay

## 2023-11-07 ENCOUNTER — Other Ambulatory Visit (HOSPITAL_COMMUNITY): Payer: Self-pay

## 2023-11-24 ENCOUNTER — Other Ambulatory Visit (HOSPITAL_BASED_OUTPATIENT_CLINIC_OR_DEPARTMENT_OTHER): Payer: Self-pay

## 2023-11-28 ENCOUNTER — Other Ambulatory Visit (HOSPITAL_BASED_OUTPATIENT_CLINIC_OR_DEPARTMENT_OTHER): Payer: Self-pay

## 2023-11-28 ENCOUNTER — Other Ambulatory Visit (HOSPITAL_COMMUNITY): Payer: Self-pay

## 2023-12-23 ENCOUNTER — Ambulatory Visit: Admitting: Physician Assistant

## 2023-12-26 DIAGNOSIS — L400 Psoriasis vulgaris: Secondary | ICD-10-CM | POA: Diagnosis not present

## 2023-12-26 DIAGNOSIS — Z111 Encounter for screening for respiratory tuberculosis: Secondary | ICD-10-CM | POA: Diagnosis not present

## 2024-01-11 ENCOUNTER — Other Ambulatory Visit: Payer: Self-pay

## 2024-01-11 ENCOUNTER — Other Ambulatory Visit: Payer: Self-pay | Admitting: Family Medicine

## 2024-01-11 ENCOUNTER — Other Ambulatory Visit (HOSPITAL_COMMUNITY): Payer: Self-pay

## 2024-01-11 DIAGNOSIS — F411 Generalized anxiety disorder: Secondary | ICD-10-CM

## 2024-01-11 MED ORDER — SERTRALINE HCL 50 MG PO TABS
75.0000 mg | ORAL_TABLET | Freq: Every day | ORAL | 0 refills | Status: DC
  Filled 2024-01-11: qty 45, 30d supply, fill #0

## 2024-01-24 ENCOUNTER — Other Ambulatory Visit: Payer: Self-pay

## 2024-01-27 ENCOUNTER — Other Ambulatory Visit: Payer: Self-pay

## 2024-01-30 ENCOUNTER — Other Ambulatory Visit: Payer: Self-pay

## 2024-01-30 ENCOUNTER — Other Ambulatory Visit: Payer: Self-pay | Admitting: Pharmacy Technician

## 2024-01-30 ENCOUNTER — Other Ambulatory Visit (HOSPITAL_COMMUNITY): Payer: Self-pay

## 2024-01-30 NOTE — Progress Notes (Signed)
 Specialty Pharmacy Refill Coordination Note  Monica [name] is a 68 y.o. female contacted today regarding refills of specialty medication(s) Risankizumab -rzaa (Skyrizi  Pen)   Patient requested Delivery   Delivery date: 02/03/24   Verified address: [address]  [facility] [place]   Medication will be filled on 02/02/24.  This fill date is pending response to refill request from provider. Patient is aware and if they have not received fill by intended date they must follow up with pharmacy.

## 2024-01-30 NOTE — Progress Notes (Signed)
 Specialty Pharmacy Ongoing Clinical Assessment Note  Monica [name] is a 68 y.o. female who is being followed by the specialty pharmacy service for RxSp Psoriasis   Patient's specialty medication(s) reviewed today: Risankizumab -rzaa (Skyrizi  Pen)   Missed doses in the last 4 weeks: 0   Patient/Caregiver did not have any additional questions or concerns.   Therapeutic benefit summary: Patient is achieving benefit   Adverse events/side effects summary: No adverse events/side effects   Patient's therapy is appropriate to: Continue    Goals Addressed             This Visit's Progress    Reduce signs and symptoms   On track    Patient is on track. Patient will maintain adherence. Patient reported no flare ups recently, she is well controlled and reported that her provider was happy with the response.         Follow up: 12 months  [facility]

## 2024-01-31 ENCOUNTER — Other Ambulatory Visit: Payer: Self-pay

## 2024-01-31 ENCOUNTER — Other Ambulatory Visit (HOSPITAL_COMMUNITY): Payer: Self-pay

## 2024-01-31 MED ORDER — RISANKIZUMAB-RZAA 150 MG/ML ~~LOC~~ SOAJ
SUBCUTANEOUS | 1 refills | Status: DC
  Filled 2024-01-31: qty 1, 84d supply, fill #0
  Filled 2024-04-23 – 2024-04-26 (×2): qty 1, 84d supply, fill #1

## 2024-02-01 ENCOUNTER — Other Ambulatory Visit: Payer: Self-pay

## 2024-02-22 ENCOUNTER — Other Ambulatory Visit (HOSPITAL_COMMUNITY): Payer: Self-pay

## 2024-02-22 ENCOUNTER — Other Ambulatory Visit: Payer: Self-pay | Admitting: Family Medicine

## 2024-02-22 ENCOUNTER — Other Ambulatory Visit: Payer: Self-pay

## 2024-02-22 ENCOUNTER — Encounter: Payer: Self-pay | Admitting: Family Medicine

## 2024-02-22 DIAGNOSIS — F411 Generalized anxiety disorder: Secondary | ICD-10-CM

## 2024-03-08 ENCOUNTER — Ambulatory Visit: Admitting: Family Medicine

## 2024-03-14 ENCOUNTER — Ambulatory Visit (INDEPENDENT_AMBULATORY_CARE_PROVIDER_SITE_OTHER): Admitting: Family Medicine

## 2024-03-14 ENCOUNTER — Other Ambulatory Visit (HOSPITAL_BASED_OUTPATIENT_CLINIC_OR_DEPARTMENT_OTHER): Payer: Self-pay

## 2024-03-14 ENCOUNTER — Encounter: Payer: Self-pay | Admitting: Family Medicine

## 2024-03-14 VITALS — BP 128/80 | HR 67 | Temp 98.0°F | Resp 16 | Ht 65.0 in | Wt 251.0 lb

## 2024-03-14 DIAGNOSIS — F411 Generalized anxiety disorder: Secondary | ICD-10-CM | POA: Diagnosis not present

## 2024-03-14 DIAGNOSIS — I10 Essential (primary) hypertension: Secondary | ICD-10-CM

## 2024-03-14 DIAGNOSIS — R42 Dizziness and giddiness: Secondary | ICD-10-CM | POA: Diagnosis not present

## 2024-03-14 DIAGNOSIS — Z7985 Long-term (current) use of injectable non-insulin antidiabetic drugs: Secondary | ICD-10-CM | POA: Diagnosis not present

## 2024-03-14 DIAGNOSIS — E1169 Type 2 diabetes mellitus with other specified complication: Secondary | ICD-10-CM

## 2024-03-14 MED ORDER — SERTRALINE HCL 50 MG PO TABS
75.0000 mg | ORAL_TABLET | Freq: Every day | ORAL | 1 refills | Status: DC
  Filled 2024-03-14 – 2024-03-19 (×2): qty 135, 90d supply, fill #0
  Filled 2024-06-20: qty 135, 90d supply, fill #1

## 2024-03-14 MED ORDER — OLMESARTAN MEDOXOMIL 20 MG PO TABS
20.0000 mg | ORAL_TABLET | Freq: Every day | ORAL | 2 refills | Status: AC
  Filled 2024-03-14 – 2024-03-19 (×2): qty 90, 90d supply, fill #0
  Filled 2024-06-20: qty 90, 90d supply, fill #1
  Filled 2024-09-21: qty 90, 90d supply, fill #2

## 2024-03-14 NOTE — Progress Notes (Signed)
 Subjective:   Chief Complaint  Patient presents with   Dizziness    Light headiness     Monica [name] is a 68 y.o. female here for follow-up of diabetes.   Pretty does not routinely ck her sugars.  Patient does not require insulin .   Medications include: Trulicity  3 mg/week.  Diet could be better.  Exercise: none  Hypertension Patient presents for hypertension follow up. She does not monitor home blood pressures. She is compliant with medications- hydrochlorothiazide  25 mg/d, Bystolic  5 mg/d. Patient has these side effects of medication: none Diet/exercise as above.  No CP or SOB.   Past Medical History:  Diagnosis Date   Allergic conjunctivitis 03/12/2015   Anxiety    Atrial fibrillation (HCC)    a. s/p DCCV in 12/2012 b. 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   History of AVN   Hypertension    IFG (impaired fasting glucose) 10/14/2013   Itchy eyes 03/12/2015   Morbid obesity (HCC)    Osteoarthritis    Personal history of colonic polyps - large hyperplastic 12/25/2013   Pre-diabetes    Psoriasis    active breakout left buttocks   Right hip pain    Right knee pain 07/06/2016   Skin infection 10/14/2013   Sleep apnea    uses mouth guard only   Swelling of limb 11/30/2012   Tachycardia, unspecified      Related testing: Retinal exam: Due Pneumovax: done  Objective:  BP 128/80 (BP Location: Left Arm, Patient Position: Sitting)   Pulse 67   Temp 98 F (36.7 C) (Oral)   Resp 16   Ht 5' 5 (1.651 m)   Wt 251 lb (113.9 kg)   LMP 06/19/2013   SpO2 95%   BMI 41.77 kg/m  General:  Well developed, well nourished, in no apparent  distress Lungs:  CTAB, no access msc use Cardio:  RRR, no bruits, no LE edema Psych: Age appropriate judgment and insight  Assessment:   Type 2 diabetes mellitus with other specified complication, without long-term current use of insulin  (HCC) - Plan: Comprehensive metabolic panel with GFR, CBC, Lipid panel, Hemoglobin A1c, Microalbumin / creatinine urine ratio  Lightheadedness - Plan: TSH  GAD (generalized anxiety disorder) - Plan: sertraline  (ZOLOFT ) 50 MG tablet  Essential hypertension - Plan: olmesartan  (BENICAR ) 20 MG tablet   Plan:   Chronic, unsure if controlled. Cont Trulicity  3 mg/week. Monitor BS's at home.  Counseled on diet and exercise. Having some lightheadedness.  Recommended she check both her blood sugar and blood pressure during these episodes.  I would also like to know what her pulses.  If these are normal, would recommend reach out to cardiology to discuss side effects from flecainide . Stable, continue Zoloft  75 mg daily. Chronic, stable.  Change hydrochlorothiazide  to olmesartan  40 mg daily.  No need to take potassium.  Recheck BMP in 1 week.  Continue Bystolic  5 mg daily.  Monitor blood pressure as above. F/u in 3-6 mo. The patient voiced understanding and agreement to the plan.  Mabel [place], DO 03/14/24 4:43 PM

## 2024-03-14 NOTE — Patient Instructions (Addendum)
 Stay hydrated.  Give us  2-3 business days to get the results of your labs back.   Keep the diet clean and stay active.  Monitor sugars and blood pressure when you are having your episodes and let me know.  We are done with the hydrochlorothiazide .   Let us  know if you need anything.

## 2024-03-15 ENCOUNTER — Ambulatory Visit: Payer: Self-pay | Admitting: Family Medicine

## 2024-03-15 ENCOUNTER — Encounter: Payer: Self-pay | Admitting: Family Medicine

## 2024-03-15 LAB — CBC
HCT: 35.1 % — ABNORMAL LOW (ref 36.0–46.0)
Hemoglobin: 11.6 g/dL — ABNORMAL LOW (ref 12.0–15.0)
MCHC: 33.2 g/dL (ref 30.0–36.0)
MCV: 89.8 fl (ref 78.0–100.0)
Platelets: 266 K/uL (ref 150.0–400.0)
RBC: 3.9 Mil/uL (ref 3.87–5.11)
RDW: 13.6 % (ref 11.5–15.5)
WBC: 4.5 K/uL (ref 4.0–10.5)

## 2024-03-15 LAB — MICROALBUMIN / CREATININE URINE RATIO
Creatinine,U: 132.6 mg/dL
Microalb Creat Ratio: UNDETERMINED mg/g (ref 0.0–30.0)
Microalb, Ur: <0.7 mg/dL

## 2024-03-15 LAB — LIPID PANEL
Cholesterol: 137 mg/dL (ref 0–200)
HDL: 44.8 mg/dL (ref >39.00)
LDL Cholesterol: 68 mg/dL (ref 0–99)
NonHDL: 92.13
Total CHOL/HDL Ratio: 3
Triglycerides: 122 mg/dL (ref 0.0–149.0)
VLDL: 24.4 mg/dL (ref 0.0–40.0)

## 2024-03-15 LAB — COMPREHENSIVE METABOLIC PANEL WITH GFR
ALT: 12 U/L (ref 0–35)
AST: 11 U/L (ref 0–37)
Albumin: 4 g/dL (ref 3.5–5.2)
Alkaline Phosphatase: 107 U/L (ref 39–117)
BUN: 10 mg/dL (ref 6–23)
CO2: 30 meq/L (ref 19–32)
Calcium: 9.2 mg/dL (ref 8.4–10.5)
Chloride: 105 meq/L (ref 96–112)
Creatinine, Ser: 0.9 mg/dL (ref 0.40–1.20)
GFR: 66.94 mL/min (ref >60.00)
Glucose, Bld: 69 mg/dL — ABNORMAL LOW (ref 70–99)
Potassium: 3.4 meq/L — ABNORMAL LOW (ref 3.5–5.1)
Sodium: 142 meq/L (ref 135–145)
Total Bilirubin: 0.4 mg/dL (ref 0.2–1.2)
Total Protein: 7.5 g/dL (ref 6.0–8.3)

## 2024-03-15 LAB — HEMOGLOBIN A1C: Hgb A1c MFr Bld: 5.5 % (ref 4.6–6.5)

## 2024-03-16 ENCOUNTER — Other Ambulatory Visit: Payer: Self-pay

## 2024-03-16 ENCOUNTER — Other Ambulatory Visit (HOSPITAL_BASED_OUTPATIENT_CLINIC_OR_DEPARTMENT_OTHER): Payer: Self-pay | Admitting: Family Medicine

## 2024-03-16 DIAGNOSIS — E876 Hypokalemia: Secondary | ICD-10-CM

## 2024-03-16 DIAGNOSIS — Z1231 Encounter for screening mammogram for malignant neoplasm of breast: Secondary | ICD-10-CM

## 2024-03-16 DIAGNOSIS — E1169 Type 2 diabetes mellitus with other specified complication: Secondary | ICD-10-CM

## 2024-03-19 ENCOUNTER — Inpatient Hospital Stay (HOSPITAL_BASED_OUTPATIENT_CLINIC_OR_DEPARTMENT_OTHER): Admission: RE | Admit: 2024-03-19 | Source: Ambulatory Visit | Admitting: Radiology

## 2024-03-19 ENCOUNTER — Other Ambulatory Visit (HOSPITAL_BASED_OUTPATIENT_CLINIC_OR_DEPARTMENT_OTHER): Payer: Self-pay

## 2024-03-19 ENCOUNTER — Other Ambulatory Visit (HOSPITAL_COMMUNITY): Payer: Self-pay

## 2024-03-19 DIAGNOSIS — Z1231 Encounter for screening mammogram for malignant neoplasm of breast: Secondary | ICD-10-CM

## 2024-03-20 ENCOUNTER — Other Ambulatory Visit (HOSPITAL_COMMUNITY): Payer: Self-pay

## 2024-04-09 ENCOUNTER — Other Ambulatory Visit: Payer: Self-pay

## 2024-04-12 ENCOUNTER — Other Ambulatory Visit (HOSPITAL_COMMUNITY): Payer: Self-pay

## 2024-04-23 ENCOUNTER — Other Ambulatory Visit: Payer: Self-pay

## 2024-04-24 ENCOUNTER — Other Ambulatory Visit: Payer: Self-pay

## 2024-04-25 ENCOUNTER — Other Ambulatory Visit: Payer: Self-pay

## 2024-04-26 ENCOUNTER — Other Ambulatory Visit: Payer: Self-pay

## 2024-04-26 ENCOUNTER — Other Ambulatory Visit: Payer: Self-pay | Admitting: Family Medicine

## 2024-04-26 ENCOUNTER — Other Ambulatory Visit: Payer: Self-pay | Admitting: Adult Health

## 2024-04-26 ENCOUNTER — Other Ambulatory Visit (HOSPITAL_COMMUNITY): Payer: Self-pay

## 2024-04-26 ENCOUNTER — Other Ambulatory Visit: Payer: Self-pay | Admitting: Cardiology

## 2024-04-26 DIAGNOSIS — I480 Paroxysmal atrial fibrillation: Secondary | ICD-10-CM

## 2024-04-26 DIAGNOSIS — I4819 Other persistent atrial fibrillation: Secondary | ICD-10-CM

## 2024-04-26 DIAGNOSIS — E1159 Type 2 diabetes mellitus with other circulatory complications: Secondary | ICD-10-CM

## 2024-04-26 MED ORDER — NEBIVOLOL HCL 10 MG PO TABS
5.0000 mg | ORAL_TABLET | Freq: Every day | ORAL | 1 refills | Status: DC
Start: 2024-04-26 — End: 2024-06-26
  Filled 2024-04-26: qty 45, 90d supply, fill #0

## 2024-04-26 NOTE — Progress Notes (Signed)
 Specialty Pharmacy Refill Coordination Note  Spoke with Monica [name] is a 68 y.o. female contacted today regarding refills of specialty medication(s) Risankizumab -rzaa (SKYRIZI , Skyrizi  Pen)  Doses on hand: 0  Injection date: 05/04/24   Patient requested: Delivery   Delivery date: 05/01/24   Verified address: [address] [facility] [place] [address]  Medication will be filled on 04/30/24.

## 2024-04-27 ENCOUNTER — Other Ambulatory Visit: Payer: Self-pay

## 2024-04-27 ENCOUNTER — Other Ambulatory Visit (HOSPITAL_COMMUNITY): Payer: Self-pay

## 2024-04-27 MED ORDER — RIVAROXABAN 20 MG PO TABS
20.0000 mg | ORAL_TABLET | Freq: Every day | ORAL | 0 refills | Status: DC
  Filled 2024-04-27: qty 90, 90d supply, fill #0

## 2024-04-27 NOTE — Telephone Encounter (Signed)
 Prescription refill request for Xarelto  received.  Indication: a ib Last office visit: 04/11/23 overdue scheduled for 05/18/24 Weight: 251# Age: 69  Scr: 0.9 epic 03/14/24 CrCl: 112 ml/min

## 2024-04-30 ENCOUNTER — Other Ambulatory Visit: Payer: Self-pay

## 2024-05-02 ENCOUNTER — Other Ambulatory Visit (HOSPITAL_COMMUNITY): Payer: Self-pay

## 2024-05-07 NOTE — Progress Notes (Deleted)
 HPI: FU atrial fibrillation. Patient underwent cardioversion on 01/09/2013 to sinus rhythm. Patient developed recurrent atrial fibrillation and had a repeat cardioversion successfully on flecainide . Follow-up exercise treadmill on flecanide did not show significant arrhythmia.  Venous Dopplers December 2022 showed no DVT. Echocardiogram repeated February 2023 and showed normal LV function, severe left atrial enlargement, moderate right atrial enlargement, mild mitral regurgitation, mild to moderate tricuspid regurgitation. Exercise treadmill March 2024 showed no significant arrhythmias but was nondiagnostic as she did not achieve target heart rate and had baseline changes.  Since I last saw her,   Current Outpatient Medications  Medication Sig Dispense Refill   acetaminophen  (TYLENOL ) 650 MG CR tablet Take 650-1,300 mg by mouth every 8 (eight) hours as needed for pain.     calcium  carbonate (TUMS EX) 750 MG chewable tablet Chew 1-3 tablets by mouth 3 (three) times daily as needed for heartburn.     Cholecalciferol  (VITAMIN D3) 2000 units TABS Take 2,000 Units by mouth every evening.     clobetasol  ointment (TEMOVATE ) 0.05 % Apply a small amount on the skin twice a day as needed to areas of psoriasis until clear 60 g 2   desonide (DESOWEN) 0.05 % cream Apply 1 application topically 2 (two) times daily as needed (for psoriasis).   0   Dulaglutide  (TRULICITY ) 3 MG/0.5ML SOAJ Inject 3 mg into the skin every 7 (seven) days. 6 mL 3   flecainide  (TAMBOCOR ) 100 MG tablet Take 1 tablet (100 mg total) by mouth 2 (two) times daily. 180 tablet 2   fluticasone  (FLONASE ) 50 MCG/ACT nasal spray PLACE 2 SPRAYS IN EACH NOSTRIL EVERY DAY 16 g 3   folic acid  (FOLVITE ) 1 MG tablet Take 2 tablets (2 mg total) by mouth daily. 60 tablet 3   methotrexate  (RHEUMATREX) 2.5 MG tablet Take 5 tablets (12.5 mg total) by mouth once a week. 20 tablet 1   Multiple Vitamin (MULTIVITAMIN WITH MINERALS) TABS tablet Take 1 tablet  by mouth daily.     nebivolol  (BYSTOLIC ) 10 MG tablet Take 0.5 tablets (5 mg total) by mouth daily. 45 tablet 1   olmesartan  (BENICAR ) 20 MG tablet Take 1 tablet (20 mg total) by mouth daily. 90 tablet 2   risankizumab -rzaa (SKYRIZI  PEN) 150 MG/ML pen Inject 1 pen injector subcutaneously every four weeks Inject at week 0 and week 4, LOADING DOSE. 2 mL 0   risankizumab -rzaa (SKYRIZI  PEN) 150 MG/ML pen Inject 1 pen injector subcutaneously every three months Start on week 16, MAINTENANCE DOSE. 1 mL 1   rivaroxaban  (XARELTO ) 20 MG TABS tablet Take 1 tablet (20 mg total) by mouth daily. 90 tablet 0   sertraline  (ZOLOFT ) 50 MG tablet Take 1.5 tablets (75 mg total) by mouth at bedtime. 135 tablet 1   No current facility-administered medications for this visit.     Past Medical History:  Diagnosis Date   Anxiety    Atrial fibrillation [facility])    a. s/p DCCV in 12/2012 b. 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   History of AVN   Hypertension    Morbid obesity (HCC)    Osteoarthritis    Personal history of colonic polyps - large hyperplastic 12/25/2013   Psoriasis    active breakout left buttocks   Sleep apnea    uses mouth guard only   Type 2 diabetes mellitus with morbid obesity (HCC)  Past Surgical History:  Procedure Laterality Date   CARDIOVERSION N/A 01/09/2013   Procedure: CARDIOVERSION;  Surgeon: Redell GORMAN Shallow, MD;  Location: [facility] ENDOSCOPY;  Service: Cardiovascular;  Laterality: N/A;   CARDIOVERSION N/A 10/07/2014   Procedure: CARDIOVERSION;  Surgeon: Redell GORMAN Shallow, MD;  Location: [facility] ENDOSCOPY;  Service: Cardiovascular;  Laterality: N/A;  09:00 Lido 60mg ,IV followed by Propofol   70mg /IV    synched electrocardioversion at 120 joules for Afib,repeated at 200 joules, 70 mg...successfully changed to SR   CARDIOVERSION N/A 12/22/2021   Procedure: CARDIOVERSION;  Surgeon: Kate Lonni CROME, MD;  Location: [facility] ENDOSCOPY;  Service: Cardiovascular;  Laterality: N/A;   FRACTURE SURGERY  07/21/12   right hip arthroplasty   JOINT REPLACEMENT     Left hip Dr. Vernetta 04-07-18   ORIF PATELLA Left 03/28/2023   Procedure: OPEN REDUCTION INTERNAL FIXATION (ORIF) PATELLA;  Surgeon: Melodi Lerner, MD;  Location: [facility] ORS;  Service: Orthopedics;  Laterality: Left;   TOTAL HIP ARTHROPLASTY  07/21/2012   Procedure: TOTAL HIP ARTHROPLASTY;  Surgeon: Lerner LULLA Melodi, MD;  Location: [facility] ORS;  Service: Orthopedics;  Laterality: Right;   TOTAL HIP ARTHROPLASTY Left 04/07/2018   Procedure: LEFT TOTAL HIP ARTHROPLASTY ANTERIOR APPROACH;  Surgeon: Vernetta Lonni GRADE, MD;  Location: [facility] ORS;  Service: Orthopedics;  Laterality: Left;  Needs RNFA    Social History   Socioeconomic History   Marital status: Married    Spouse name: [name]   Number of children: 1   Years of education: 12   Highest education level: Not on file  Occupational History   Occupation: ED ADMISSIONS     Employer: [employer]    Comment: [employer]   Tobacco Use   Smoking status: Former    Current packs/day: 0.00    Types: Cigarettes    Quit date: 09/13/1982    Years since quitting: 41.6   Smokeless tobacco: Never  Vaping Use   Vaping status: Never Used  Substance and Sexual Activity   Alcohol  use: Not Currently    Comment: Occasionally   Drug use: No   Sexual activity: Yes    Partners: Male  Other Topics Concern   Not on file  Social History Narrative   Marital Status: Married ([name])    Children:  Son Metallurgist)    Pets: None   Living Situation: Lives with Norleen   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    EducationEngineer, petroleum    Tobacco Use/Exposure: Former Smoker.  She used to smoke 4 cigarettes per day for five years and quit 32 years ago.    Alcohol  Use:  Occasional   Drug Use:  None   Diet:  Regular   Exercise:  Water  Aerobics (2 x  per week)     Hobbies: Shopping, Cycling             Social Drivers of Health   Financial Resource Strain: Low Risk  (03/30/2018)   Overall Financial Resource Strain (CARDIA)    Difficulty of Paying Living Expenses: Not hard at all  Food Insecurity: No Food Insecurity (03/28/2023)   Hunger Vital Sign    Worried About Running Out of Food in the Last Year: Never true    Ran Out of Food in the Last Year: Never true  Transportation Needs: No Transportation Needs (03/28/2023)   PRAPARE - Administrator, Civil Service (Medical): No    Lack of Transportation (Non-Medical): No  Physical Activity: Unknown (03/30/2018)   Exercise Vital Sign    Days of Exercise per Week: Patient declined    Minutes of Exercise per Session: Patient declined  Stress: Not on file  Social Connections: Unknown (03/30/2018)   Social Connection and Isolation Panel    Frequency of Communication with Friends and Family: Patient declined    Frequency of Social Gatherings with Friends and Family: Patient declined    Attends Religious Services: Patient declined    Database administrator or Organizations: Patient declined    Attends Banker Meetings: Patient declined    Marital Status: Patient declined  Intimate Partner Violence: Not At Risk (03/28/2023)   Humiliation, Afraid, Rape, and Kick questionnaire    Fear of Current or Ex-Partner: No    Emotionally Abused: No    Physically Abused: No    Sexually Abused: No    Family History  Problem Relation Age of Onset   Hypertension Mother    Diabetes Mother    Alzheimer's disease Mother    Thyroid  disease Mother    Diabetes Son     ROS: no fevers or chills, productive cough, hemoptysis, dysphasia, odynophagia, melena, hematochezia, dysuria, hematuria, rash, seizure activity, orthopnea, PND, pedal edema, claudication. Remaining systems are negative.  Physical Exam: Well-developed well-nourished in no acute distress.  Skin is warm and dry.   HEENT is normal.  Neck is supple.  Chest is clear to auscultation with normal expansion.  Cardiovascular exam is regular rate and rhythm.  Abdominal exam nontender or distended. No masses palpated. Extremities show no edema. neuro grossly intact  ECG- personally reviewed  A/P  1 paroxysmal atrial fibrillation-patient remains in sinus rhythm.  Continue Bystolic , flecainide  and Xarelto .  2 hypertension-blood pressure is controlled.  Continue present medications and follow.  3 obesity-we discussed the importance of weight loss.  Redell Shallow, MD

## 2024-05-09 ENCOUNTER — Telehealth: Payer: Self-pay | Admitting: Family Medicine

## 2024-05-09 NOTE — Telephone Encounter (Signed)
 Copied from CRM [id number]. Topic: Medicare AWV >> May 09, 2024 11:24 AM Nathanel DEL wrote: Reason for CRM: Called LVM 05/09/2024 to schedule AWV. Please schedule Virtual or Telehealth visits ONLY  Nathanel Paschal; Care Guide Ambulatory Clinical Support Cut Off l [facility] Direct Dial: [phone]

## 2024-05-18 ENCOUNTER — Ambulatory Visit: Attending: Cardiology | Admitting: Cardiology

## 2024-05-26 NOTE — Progress Notes (Deleted)
 HPI: FU atrial fibrillation. Patient underwent cardioversion on 01/09/2013 to sinus rhythm. Patient developed recurrent atrial fibrillation and had a repeat cardioversion successfully on flecainide . Follow-up exercise treadmill on flecanide did not show significant arrhythmia.  Venous Dopplers December 2022 showed no DVT. Echocardiogram repeated February 2023 and showed normal LV function, severe left atrial enlargement, moderate right atrial enlargement, mild mitral regurgitation, mild to moderate tricuspid regurgitation. Exercise treadmill March 2024 showed no significant arrhythmias but was nondiagnostic as she did not achieve target heart rate and had baseline changes.  Since I last saw her,   Current Outpatient Medications  Medication Sig Dispense Refill   acetaminophen  (TYLENOL ) 650 MG CR tablet Take 650-1,300 mg by mouth every 8 (eight) hours as needed for pain.     calcium  carbonate (TUMS EX) 750 MG chewable tablet Chew 1-3 tablets by mouth 3 (three) times daily as needed for heartburn.     Cholecalciferol  (VITAMIN D3) 2000 units TABS Take 2,000 Units by mouth every evening.     clobetasol  ointment (TEMOVATE ) 0.05 % Apply a small amount on the skin twice a day as needed to areas of psoriasis until clear 60 g 2   desonide (DESOWEN) 0.05 % cream Apply 1 application topically 2 (two) times daily as needed (for psoriasis).   0   Dulaglutide  (TRULICITY ) 3 MG/0.5ML SOAJ Inject 3 mg into the skin every 7 (seven) days. 6 mL 3   flecainide  (TAMBOCOR ) 100 MG tablet Take 1 tablet (100 mg total) by mouth 2 (two) times daily. 180 tablet 2   fluticasone  (FLONASE ) 50 MCG/ACT nasal spray PLACE 2 SPRAYS IN EACH NOSTRIL EVERY DAY 16 g 3   folic acid  (FOLVITE ) 1 MG tablet Take 2 tablets (2 mg total) by mouth daily. 60 tablet 3   methotrexate  (RHEUMATREX) 2.5 MG tablet Take 5 tablets (12.5 mg total) by mouth once a week. 20 tablet 1   Multiple Vitamin (MULTIVITAMIN WITH MINERALS) TABS tablet Take 1 tablet  by mouth daily.     nebivolol  (BYSTOLIC ) 10 MG tablet Take 0.5 tablets (5 mg total) by mouth daily. 45 tablet 1   olmesartan  (BENICAR ) 20 MG tablet Take 1 tablet (20 mg total) by mouth daily. 90 tablet 2   risankizumab -rzaa (SKYRIZI  PEN) 150 MG/ML pen Inject 1 pen injector subcutaneously every four weeks Inject at week 0 and week 4, LOADING DOSE. 2 mL 0   risankizumab -rzaa (SKYRIZI  PEN) 150 MG/ML pen Inject 1 pen injector subcutaneously every three months Start on week 16, MAINTENANCE DOSE. 1 mL 1   rivaroxaban  (XARELTO ) 20 MG TABS tablet Take 1 tablet (20 mg total) by mouth daily. 90 tablet 0   sertraline  (ZOLOFT ) 50 MG tablet Take 1.5 tablets (75 mg total) by mouth at bedtime. 135 tablet 1   No current facility-administered medications for this visit.     Past Medical History:  Diagnosis Date   Anxiety    Atrial fibrillation [facility])    a. s/p DCCV in 12/2012 b. 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   History of AVN   Hypertension    Morbid obesity (HCC)    Osteoarthritis    Personal history of colonic polyps - large hyperplastic 12/25/2013   Psoriasis    active breakout left buttocks   Sleep apnea    uses mouth guard only   Type 2 diabetes mellitus with morbid obesity (HCC)  Past Surgical History:  Procedure Laterality Date   CARDIOVERSION N/A 01/09/2013   Procedure: CARDIOVERSION;  Surgeon: Redell GORMAN Shallow, MD;  Location: [facility] ENDOSCOPY;  Service: Cardiovascular;  Laterality: N/A;   CARDIOVERSION N/A 10/07/2014   Procedure: CARDIOVERSION;  Surgeon: Redell GORMAN Shallow, MD;  Location: [facility] ENDOSCOPY;  Service: Cardiovascular;  Laterality: N/A;  09:00 Lido 60mg ,IV followed by Propofol   70mg /IV    synched electrocardioversion at 120 joules for Afib,repeated at 200 joules, 70 mg...successfully changed to SR   CARDIOVERSION N/A 12/22/2021   Procedure: CARDIOVERSION;  Surgeon: Kate Lonni CROME, MD;  Location: [facility] ENDOSCOPY;  Service: Cardiovascular;  Laterality: N/A;   FRACTURE SURGERY  07/21/12   right hip arthroplasty   JOINT REPLACEMENT     Left hip Dr. Vernetta 04-07-18   ORIF PATELLA Left 03/28/2023   Procedure: OPEN REDUCTION INTERNAL FIXATION (ORIF) PATELLA;  Surgeon: Melodi Lerner, MD;  Location: [facility] ORS;  Service: Orthopedics;  Laterality: Left;   TOTAL HIP ARTHROPLASTY  07/21/2012   Procedure: TOTAL HIP ARTHROPLASTY;  Surgeon: Lerner LULLA Melodi, MD;  Location: [facility] ORS;  Service: Orthopedics;  Laterality: Right;   TOTAL HIP ARTHROPLASTY Left 04/07/2018   Procedure: LEFT TOTAL HIP ARTHROPLASTY ANTERIOR APPROACH;  Surgeon: Vernetta Lonni GRADE, MD;  Location: [facility] ORS;  Service: Orthopedics;  Laterality: Left;  Needs RNFA    Social History   Socioeconomic History   Marital status: Married    Spouse name: [name]   Number of children: 1   Years of education: 12   Highest education level: Not on file  Occupational History   Occupation: ED ADMISSIONS     Employer: [employer]    Comment: [employer]   Tobacco Use   Smoking status: Former    Current packs/day: 0.00    Types: Cigarettes    Quit date: 09/13/1982    Years since quitting: 41.7   Smokeless tobacco: Never  Vaping Use   Vaping status: Never Used  Substance and Sexual Activity   Alcohol  use: Not Currently    Comment: Occasionally   Drug use: No   Sexual activity: Yes    Partners: Male  Other Topics Concern   Not on file  Social History Narrative   Marital Status: Married ([name])    Children:  Son Metallurgist)    Pets: None   Living Situation: Lives with Norleen   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    EducationEngineer, petroleum    Tobacco Use/Exposure: Former Smoker.  She used to smoke 4 cigarettes per day for five years and quit 32 years ago.    Alcohol  Use:  Occasional   Drug Use:  None   Diet:  Regular   Exercise:  Water  Aerobics (2 x  per week)     Hobbies: Shopping, Cycling             Social Drivers of Corporate investment banker Strain: Low Risk  (03/30/2018)   Overall Financial Resource Strain (CARDIA)    Difficulty of Paying Living Expenses: Not hard at all  Food Insecurity: No Food Insecurity (03/28/2023)   Hunger Vital Sign    Worried About Running Out of Food in the Last Year: Never true    Ran Out of Food in the Last Year: Never true  Transportation Needs: No Transportation Needs (03/28/2023)   PRAPARE - Administrator, Civil Service (Medical): No    Lack of Transportation (Non-Medical): No  Physical Activity: Unknown (03/30/2018)   Exercise Vital Sign    Days of Exercise per Week: Patient declined    Minutes of Exercise per Session: Patient declined  Stress: Not on file  Social Connections: Unknown (03/30/2018)   Social Connection and Isolation Panel    Frequency of Communication with Friends and Family: Patient declined    Frequency of Social Gatherings with Friends and Family: Patient declined    Attends Religious Services: Patient declined    Database administrator or Organizations: Patient declined    Attends Banker Meetings: Patient declined    Marital Status: Patient declined  Intimate Partner Violence: Not At Risk (03/28/2023)   Humiliation, Afraid, Rape, and Kick questionnaire    Fear of Current or Ex-Partner: No    Emotionally Abused: No    Physically Abused: No    Sexually Abused: No    Family History  Problem Relation Age of Onset   Hypertension Mother    Diabetes Mother    Alzheimer's disease Mother    Thyroid  disease Mother    Diabetes Son     ROS: no fevers or chills, productive cough, hemoptysis, dysphasia, odynophagia, melena, hematochezia, dysuria, hematuria, rash, seizure activity, orthopnea, PND, pedal edema, claudication. Remaining systems are negative.  Physical Exam: Well-developed well-nourished in no acute distress.  Skin is warm and dry.   HEENT is normal.  Neck is supple.  Chest is clear to auscultation with normal expansion.  Cardiovascular exam is regular rate and rhythm.  Abdominal exam nontender or distended. No masses palpated. Extremities show no edema. neuro grossly intact  ECG- personally reviewed  A/P  1 paroxysmal atrial fibrillation-patient remains in sinus rhythm.  Continue Bystolic , flecainide  and Xarelto .  2 hypertension-blood pressure is controlled.  Continue present medications and follow.  3 obesity-we discussed the importance of weight loss.  Redell Shallow, MD

## 2024-05-28 ENCOUNTER — Ambulatory Visit: Attending: Cardiology | Admitting: Cardiology

## 2024-05-29 ENCOUNTER — Encounter: Payer: Self-pay | Admitting: Cardiology

## 2024-05-30 ENCOUNTER — Other Ambulatory Visit (HOSPITAL_COMMUNITY): Payer: Self-pay

## 2024-05-30 ENCOUNTER — Encounter: Payer: Self-pay | Admitting: Gastroenterology

## 2024-05-30 ENCOUNTER — Ambulatory Visit: Admitting: Gastroenterology

## 2024-05-30 VITALS — BP 134/92 | HR 72 | Ht 65.0 in | Wt 247.4 lb

## 2024-05-30 DIAGNOSIS — K760 Fatty (change of) liver, not elsewhere classified: Secondary | ICD-10-CM | POA: Diagnosis not present

## 2024-05-30 DIAGNOSIS — Z860100 Personal history of colon polyps, unspecified: Secondary | ICD-10-CM

## 2024-05-30 DIAGNOSIS — Z7901 Long term (current) use of anticoagulants: Secondary | ICD-10-CM

## 2024-05-30 DIAGNOSIS — D649 Anemia, unspecified: Secondary | ICD-10-CM | POA: Diagnosis not present

## 2024-05-30 DIAGNOSIS — R1319 Other dysphagia: Secondary | ICD-10-CM

## 2024-05-30 MED ORDER — NA SULFATE-K SULFATE-MG SULF 17.5-3.13-1.6 GM/177ML PO SOLN
1.0000 | ORAL | 0 refills | Status: DC
  Filled 2024-05-30: qty 354, 1d supply, fill #0

## 2024-05-30 NOTE — Patient Instructions (Signed)
 Your provider has requested that you go to the basement level for lab work before leaving today. Press B on the elevator. The lab is located at the first door on the left as you exit the elevator.   We have sent the following medications to your pharmacy for you to pick up at your convenience: Suprep    Due to recent changes in healthcare laws, you may see the results of your imaging and laboratory studies on MyChart before your provider has had a chance to review them.  We understand that in some cases there may be results that are confusing or concerning to you. Not all laboratory results come back in the same time frame and the provider may be waiting for multiple results in order to interpret others.  Please give us  48 hours in order for your provider to thoroughly review all the results before contacting the office for clarification of your results.   _______________________________________________________  If your blood pressure at your visit was 140/90 or greater, please contact your primary care physician to follow up on this.  _______________________________________________________  If you are age 66 or older, your body mass index should be between 23-30. Your Body mass index is 41.17 kg/m. If this is out of the aforementioned range listed, please consider follow up with your Primary Care Provider.  If you are age 74 or younger, your body mass index should be between 19-25. Your Body mass index is 41.17 kg/m. If this is out of the aformentioned range listed, please consider follow up with your Primary Care Provider.   ________________________________________________________  The [facility] providers would like to encourage you to use MYCHART to communicate with providers for non-urgent requests or questions.  Due to long hold times on the telephone, sending your provider a message by [facility] may be a faster and more efficient way to get a response.  Please allow 48 business hours  for a response.  Please remember that this is for non-urgent requests.  _______________________________________________________  [facility] is using a team-based approach to care.  Your team is made up of your doctor and two to three APPS. Our APPS (Nurse Practitioners and Physician Assistants) work with your physician to ensure care continuity for you. They are fully qualified to address your health concerns and develop a treatment plan. They communicate directly with your gastroenterologist to care for you. Seeing the Advanced Practice Practitioners on your physician's team can help you by facilitating care more promptly, often allowing for earlier appointments, access to diagnostic testing, procedures, and other specialty referrals.   _______________________________________________________  If your blood pressure at your visit was 140/90 or greater, please contact your primary care physician to follow up on this.  _______________________________________________________  If you are age 55 or older, your body mass index should be between 23-30. Your Body mass index is 41.17 kg/m. If this is out of the aforementioned range listed, please consider follow up with your Primary Care Provider.  If you are age 51 or younger, your body mass index should be between 19-25. Your Body mass index is 41.17 kg/m. If this is out of the aformentioned range listed, please consider follow up with your Primary Care Provider.   ________________________________________________________  The [facility] providers would like to encourage you to use MYCHART to communicate with providers for non-urgent requests or questions.  Due to long hold times on the telephone, sending your provider a message by [facility] may be a faster and more efficient way to  get a response.  Please allow 48 business hours for a response.  Please remember that this is for non-urgent requests.   _______________________________________________________  [facility] is using a team-based approach to care.  Your team is made up of your doctor and two to three APPS. Our APPS (Nurse Practitioners and Physician Assistants) work with your physician to ensure care continuity for you. They are fully qualified to address your health concerns and develop a treatment plan. They communicate directly with your gastroenterologist to care for you. Seeing the Advanced Practice Practitioners on your physician's team can help you by facilitating care more promptly, often allowing for earlier appointments, access to diagnostic testing, procedures, and other specialty referrals.   Thank you for choosing me and [facility].  [name], PA-C

## 2024-05-30 NOTE — Progress Notes (Signed)
 Monica [name] [id number] Apr 10, 1956   Chief Complaint: Discuss colonoscopy  Referring Provider: Frann Mabel Mt* Primary GI MD: Dr. Avram  HPI: Monica [name] is a 68 y.o. female with past medical history of anxiety, A-fib on Xarelto , chronic anticoagulation, HTN, morbid obesity, osteoarthritis, colon polyps, sleep apnea, T2DM who presents today to discuss colonoscopy.  Labs 03/14/2024: Normocytic anemia with hemoglobin 11.6   Patient states she tried to do a Cologuard a couple years ago but sample could not be processed so she decided to follow-up and do a colonoscopy instead.  States she has always had irregular bowel movements and does not have a bowel movement every day.  Usually has a bowel movement 3-4 times a week.  This is normal for her and she denies any constipation or diarrhea.  Denies any change in stool caliber but states lately her stools have been wavy.  Color of stool varies but she denies any melena.  Denies blood in her stool.  Denies abdominal pain.  In the last 4 to 5 months has noticed a new sensation when swallowing which feels like a catch in her throat.  Sometimes takes a while for food to go down.  Has not required regurgitation and does not feel like food gets stuck for a long time.  This is only happening occasionally and not with every meal.  She denies any heartburn or acid reflux.  No prior EGD.  Denies any known family history of esophageal, stomach, or colon cancer.  Denies any chest pain or shortness of breath.  She is on Trulicity .  States that initially she lost 50 pounds on Trulicity , but had a rough year last year between retiring, the loss of her son, and breaking her patella, and gained that weight back.  Denies significant alcohol  use.  Previous GI Procedures/Imaging   RUQ US  09/06/2023 1. Increased hepatic parenchymal echogenicity suggestive of steatosis. 2. No cholelithiasis or sonographic evidence for  acute cholecystitis.  Colonoscopy 12/25/2013 - 2 sessile polyps measuring 10 and 12 mm in size were found in the sigmoid colon, polypectomy was performed using snare cautery, prophylactic clips applied - The colon mucosa was otherwise normal, excellent prep, first colonoscopy - Recall 5 years Path: Surgical [P], sigmoid, polyp (2) - HYPERPLASTIC POLYP(S) (MULTIPLE FRAGMENTS). NO ADENOMATOUS CHANGE OR MALIGNANCY IDENTIFIED.   Past Medical History:  Diagnosis Date   Anxiety    Atrial fibrillation (HCC)    a. s/p DCCV in 12/2012 b. 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   History of AVN   Hypertension    Morbid obesity (HCC)    Osteoarthritis    Personal history of colonic polyps - large hyperplastic 12/25/2013   Psoriasis    active breakout left buttocks   Sleep apnea    uses mouth guard only   Type 2 diabetes mellitus with morbid obesity (HCC)     Past Surgical History:  Procedure Laterality Date   CARDIOVERSION N/A 01/09/2013   Procedure: CARDIOVERSION;  Surgeon: Redell GORMAN Shallow, MD;  Location: [facility] ENDOSCOPY;  Service: Cardiovascular;  Laterality: N/A;   CARDIOVERSION N/A 10/07/2014   Procedure: CARDIOVERSION;  Surgeon: Redell GORMAN Shallow, MD;  Location: [facility] ENDOSCOPY;  Service: Cardiovascular;  Laterality: N/A;  09:00 Lido 60mg ,IV followed by Propofol   70mg /IV    synched electrocardioversion at 120 joules for Afib,repeated at 200 joules, 70 mg...successfully changed to SR   CARDIOVERSION N/A 12/22/2021  Procedure: CARDIOVERSION;  Surgeon: Kate Lonni CROME, MD;  Location: [facility] ENDOSCOPY;  Service: Cardiovascular;  Laterality: N/A;   FRACTURE SURGERY  07/21/12   right hip arthroplasty   JOINT REPLACEMENT     Left hip Dr. Vernetta 04-07-18   ORIF PATELLA Left 03/28/2023   Procedure: OPEN REDUCTION INTERNAL FIXATION (ORIF) PATELLA;  Surgeon: Melodi Lerner, MD;  Location: [facility] ORS;   Service: Orthopedics;  Laterality: Left;   TOTAL HIP ARTHROPLASTY  07/21/2012   Procedure: TOTAL HIP ARTHROPLASTY;  Surgeon: Lerner LULLA Melodi, MD;  Location: [facility] ORS;  Service: Orthopedics;  Laterality: Right;   TOTAL HIP ARTHROPLASTY Left 04/07/2018   Procedure: LEFT TOTAL HIP ARTHROPLASTY ANTERIOR APPROACH;  Surgeon: Vernetta Lonni GRADE, MD;  Location: [facility] ORS;  Service: Orthopedics;  Laterality: Left;  Needs RNFA    Current Outpatient Medications  Medication Sig Dispense Refill   acetaminophen  (TYLENOL ) 650 MG CR tablet Take 650-1,300 mg by mouth every 8 (eight) hours as needed for pain.     calcium  carbonate (TUMS EX) 750 MG chewable tablet Chew 1-3 tablets by mouth 3 (three) times daily as needed for heartburn.     Cholecalciferol  (VITAMIN D3) 2000 units TABS Take 2,000 Units by mouth every evening.     clobetasol  ointment (TEMOVATE ) 0.05 % Apply a small amount on the skin twice a day as needed to areas of psoriasis until clear 60 g 2   desonide (DESOWEN) 0.05 % cream Apply 1 application topically 2 (two) times daily as needed (for psoriasis).   0   Dulaglutide  (TRULICITY ) 3 MG/0.5ML SOAJ Inject 3 mg into the skin every 7 (seven) days. 6 mL 3   flecainide  (TAMBOCOR ) 100 MG tablet Take 1 tablet (100 mg total) by mouth 2 (two) times daily. 180 tablet 2   fluticasone  (FLONASE ) 50 MCG/ACT nasal spray PLACE 2 SPRAYS IN EACH NOSTRIL EVERY DAY 16 g 3   folic acid  (FOLVITE ) 1 MG tablet Take 2 tablets (2 mg total) by mouth daily. 60 tablet 3   methotrexate  (RHEUMATREX) 2.5 MG tablet Take 5 tablets (12.5 mg total) by mouth once a week. 20 tablet 1   Multiple Vitamin (MULTIVITAMIN WITH MINERALS) TABS tablet Take 1 tablet by mouth daily.     nebivolol  (BYSTOLIC ) 10 MG tablet Take 0.5 tablets (5 mg total) by mouth daily. 45 tablet 1   olmesartan  (BENICAR ) 20 MG tablet Take 1 tablet (20 mg total) by mouth daily. 90 tablet 2   risankizumab -rzaa (SKYRIZI  PEN) 150 MG/ML pen Inject 1 pen injector  subcutaneously every four weeks Inject at week 0 and week 4, LOADING DOSE. 2 mL 0   risankizumab -rzaa (SKYRIZI  PEN) 150 MG/ML pen Inject 1 pen injector subcutaneously every three months Start on week 16, MAINTENANCE DOSE. 1 mL 1   rivaroxaban  (XARELTO ) 20 MG TABS tablet Take 1 tablet (20 mg total) by mouth daily. 90 tablet 0   sertraline  (ZOLOFT ) 50 MG tablet Take 1.5 tablets (75 mg total) by mouth at bedtime. 135 tablet 1   No current facility-administered medications for this visit.    Allergies as of 05/30/2024   (No Known Allergies)    Family History  Problem Relation Age of Onset   Hypertension Mother    Diabetes Mother    Alzheimer's disease Mother    Thyroid  disease Mother    Diabetes Son     Social History   Tobacco Use   Smoking status: Former    Current packs/day: 0.00    Types: Cigarettes  Quit date: 09/13/1982    Years since quitting: 41.7   Smokeless tobacco: Never  Vaping Use   Vaping status: Never Used  Substance Use Topics   Alcohol  use: Not Currently    Comment: Occasionally   Drug use: No     Review of Systems:    Constitutional: No weight loss, fever, chills Cardiovascular: No chest pain Respiratory: No SOB  Gastrointestinal: See HPI and otherwise negative   Physical Exam:  Vital signs: BP (!) 134/92   Pulse 72   Ht 5' 5 (1.651 m)   Wt 247 lb 6 oz (112.2 kg)   LMP 06/19/2013   BMI 41.17 kg/m    Constitutional: Pleasant, obese female in NAD, alert and cooperative Head:  Normocephalic and atraumatic.  Eyes: No scleral icterus.  Mouth: No oral lesions. Respiratory: Respirations even and unlabored. Lungs clear to auscultation bilaterally.  No wheezes, crackles, or rhonchi.  Cardiovascular: Regular rate, irregularly irregular rhythm. No murmurs. No peripheral edema. Gastrointestinal:  Soft, nondistended, nontender. No rebound or guarding. Normal bowel sounds. No appreciable masses or hepatomegaly. Rectal:  Not performed.  Neurologic:   Alert and oriented x4;  grossly normal neurologically.  Skin:   Dry and intact without significant lesions or rashes. Psychiatric: Oriented to person, place and time. Demonstrates good judgement and reason without abnormal affect or behaviors.   RELEVANT LABS AND IMAGING: CBC    Component Value Date/Time   WBC 4.5 03/14/2024 1634   RBC 3.90 03/14/2024 1634   HGB 11.6 (L) 03/14/2024 1634   HGB 11.3 03/26/2022 0811   HCT 35.1 (L) 03/14/2024 1634   HCT 34.3 03/26/2022 0811   PLT 266.0 03/14/2024 1634   PLT 332 03/26/2022 0811   MCV 89.8 03/14/2024 1634   MCV 95 03/26/2022 0811   MCH 30.4 03/29/2023 0416   MCHC 33.2 03/14/2024 1634   RDW 13.6 03/14/2024 1634   RDW 14.6 03/26/2022 0811   LYMPHSABS 1.9 03/26/2022 0811   MONOABS 0.6 07/24/2013 0922   EOSABS 0.2 03/26/2022 0811   BASOSABS 0.0 03/26/2022 0811    CMP     Component Value Date/Time   NA 142 03/14/2024 1634   NA 136 03/26/2022 0811   K 3.4 (L) 03/14/2024 1634   CL 105 03/14/2024 1634   CO2 30 03/14/2024 1634   GLUCOSE 69 (L) 03/14/2024 1634   BUN 10 03/14/2024 1634   BUN 8 03/26/2022 0811   CREATININE 0.90 03/14/2024 1634   CREATININE 0.80 10/20/2017 1614   CALCIUM  9.2 03/14/2024 1634   PROT 7.5 03/14/2024 1634   PROT 7.5 03/26/2022 0811   ALBUMIN 4.0 03/14/2024 1634   ALBUMIN 4.6 03/26/2022 0811   AST 11 03/14/2024 1634   ALT 12 03/14/2024 1634   ALKPHOS 107 03/14/2024 1634   BILITOT 0.4 03/14/2024 1634   BILITOT 0.5 03/26/2022 0811   GFRNONAA >60 03/29/2023 0416   GFRNONAA 81 10/20/2017 1614   GFRAA 81 09/17/2019 0956   GFRAA 94 10/20/2017 1614   Echocardiogram 09/18/2021 1. Left ventricular ejection fraction, by estimation, is 55 to 60% . The left ventricle has normal function. The left ventricle has no regional wall motion abnormalities. Left ventricular diastolic function could not be evaluated.  2. Right ventricular systolic function is normal. The right ventricular size is mildly enlarged. There is  normal pulmonary artery systolic pressure.  3. Left atrial size was severely dilated.  4. Right atrial size was moderately dilated.  5. The mitral valve is grossly normal. Mild mitral valve regurgitation.  6. Tricuspid valve regurgitation is mild to moderate.  7. The aortic valve is grossly normal. Aortic valve regurgitation is not visualized. No aortic stenosis is present.  8. The inferior vena cava is normal in size with greater than 50% respiratory variability, suggesting right atrial pressure of 3 mmHg.  Assessment/Plan:   History of colon polyps Esophageal dysphagia Anemia Chronic anticoagulation Patient seen today to discuss scheduling colonoscopy.  Last colonoscopy done 12/25/2013 with finding of 2 hyperplastic polyps measuring 10 and 12 mm.  Had been advised to have a recall in 5 years but has not followed up since then.  States she tried to do a Cologuard couple years ago but the sample could not be processed. Denies any significant change in bowel habits, blood in stool, melena, abdominal pain.  She has been having a sensation over the last 4 to 5 months of increased difficulty swallowing, food going down slowly and a catch in her throat.  No unintentional weight loss, reflux, heartburn, nausea/vomiting.  No prior EGD.  She has had normocytic anemia on labs over the course of the last year or so, which she was unaware of and she denies any workup for this.  - Schedule EGD/colonoscopy. I thoroughly discussed the procedure with the patient to include nature of the procedure, alternatives, benefits, and risks (including but not limited to bleeding, infection, perforation, anesthesia/cardiac/pulmonary complications). Patient verbalized understanding and gave verbal consent to proceed with procedure.  - Request Xarelto  hold - Labs: CBC, CMP, iron/ferritin, vitamin B12, folate (patient has not had water  today and would like to come back tomorrow for labs)  Hepatic steatosis Patient had  mild elevation in alkaline phosphatase months ago and as part of workup for this had a right upper quadrant ultrasound which showed hepatic steatosis.  Most recently liver enzymes have been normal and she has a fibrosis 4 score of 0.79.    - Abstain from all alcohol  including beer, wine, liquor, and non-alcoholic beer.  - Work to maintain a healthy weight through portion control and exercise  - Maximize control of any hyperglycemia and hyperlipidemia  - Hopefully will have some benefit from Trulicity .   [name], PA-C [facility] 05/30/2024, 1:15 PM  Patient Care Team: Frann Mabel Mt, DO as PCP - General (Family Medicine) Pietro Redell RAMAN, MD as PCP - Cardiology (Cardiology)

## 2024-05-31 ENCOUNTER — Telehealth: Payer: Self-pay

## 2024-05-31 ENCOUNTER — Other Ambulatory Visit

## 2024-05-31 DIAGNOSIS — Z860100 Personal history of colon polyps, unspecified: Secondary | ICD-10-CM

## 2024-05-31 DIAGNOSIS — D649 Anemia, unspecified: Secondary | ICD-10-CM

## 2024-05-31 DIAGNOSIS — K760 Fatty (change of) liver, not elsewhere classified: Secondary | ICD-10-CM | POA: Diagnosis not present

## 2024-05-31 DIAGNOSIS — R1319 Other dysphagia: Secondary | ICD-10-CM | POA: Diagnosis not present

## 2024-05-31 DIAGNOSIS — Z7901 Long term (current) use of anticoagulants: Secondary | ICD-10-CM

## 2024-05-31 LAB — COMPREHENSIVE METABOLIC PANEL WITH GFR
ALT: 27 U/L (ref 0–35)
AST: 21 U/L (ref 0–37)
Albumin: 4.1 g/dL (ref 3.5–5.2)
Alkaline Phosphatase: 125 U/L — ABNORMAL HIGH (ref 39–117)
BUN: 15 mg/dL (ref 6–23)
CO2: 27 meq/L (ref 19–32)
Calcium: 8.8 mg/dL (ref 8.4–10.5)
Chloride: 106 meq/L (ref 96–112)
Creatinine, Ser: 0.81 mg/dL (ref 0.40–1.20)
GFR: 75.85 mL/min (ref >60.00)
Glucose, Bld: 83 mg/dL (ref 70–99)
Potassium: 3.5 meq/L (ref 3.5–5.1)
Sodium: 140 meq/L (ref 135–145)
Total Bilirubin: 0.6 mg/dL (ref 0.2–1.2)
Total Protein: 7.6 g/dL (ref 6.0–8.3)

## 2024-05-31 LAB — CBC WITH DIFFERENTIAL/PLATELET
Basophils Absolute: 0 K/uL (ref 0.0–0.1)
Basophils Relative: 0.5 % (ref 0.0–3.0)
Eosinophils Absolute: 0.1 K/uL (ref 0.0–0.7)
Eosinophils Relative: 2.4 % (ref 0.0–5.0)
HCT: 36.2 % (ref 36.0–46.0)
Hemoglobin: 12.1 g/dL (ref 12.0–15.0)
Lymphocytes Relative: 50.3 % — ABNORMAL HIGH (ref 12.0–46.0)
Lymphs Abs: 2.9 K/uL (ref 0.7–4.0)
MCHC: 33.5 g/dL (ref 30.0–36.0)
MCV: 87 fl (ref 78.0–100.0)
Monocytes Absolute: 0.4 K/uL (ref 0.1–1.0)
Monocytes Relative: 6.9 % (ref 3.0–12.0)
Neutro Abs: 2.3 K/uL (ref 1.4–7.7)
Neutrophils Relative %: 39.9 % — ABNORMAL LOW (ref 43.0–77.0)
Platelets: 315 K/uL (ref 150.0–400.0)
RBC: 4.16 Mil/uL (ref 3.87–5.11)
RDW: 13.9 % (ref 11.5–15.5)
WBC: 5.7 K/uL (ref 4.0–10.5)

## 2024-05-31 LAB — IBC + FERRITIN
Ferritin: 15.2 ng/mL (ref 10.0–291.0)
Iron: 61 ug/dL (ref 42–145)
Saturation Ratios: 13.6 % — ABNORMAL LOW (ref 20.0–50.0)
TIBC: 448 ug/dL (ref 250.0–450.0)
Transferrin: 320 mg/dL (ref 212.0–360.0)

## 2024-05-31 LAB — VITAMIN B12: Vitamin B-12: 385 pg/mL (ref 211–911)

## 2024-05-31 LAB — FOLATE: Folate: 11.3 ng/mL (ref >5.9)

## 2024-05-31 NOTE — Telephone Encounter (Signed)
 Left message to call back to schedule in office preop appt

## 2024-05-31 NOTE — Telephone Encounter (Signed)
 Request for surgical clearance:     Endoscopy Procedure  What type of surgery is being performed?     Colon/EGD  When is this surgery scheduled?     07/05/24  What type of clearance is required ?   Pharmacy  Are there any medications that need to be held prior to surgery and how long? Xarelto  x2 days   Practice name and name of physician performing surgery?      [facility]  What is your office phone and fax number?      Phone- [phone]  Fax- [phone]  Anesthesia type (None, local, MAC, general) ?       MAC  Please route your response to [facility]

## 2024-05-31 NOTE — Telephone Encounter (Signed)
   Name: Monica [name]  DOB: 03/13/1956  MRN: [id number]  Primary Cardiologist: Redell Shallow, MD  Chart reviewed as part of pre-operative protocol coverage. Because of [name]'s past medical history and time since last visit, she will require a follow-up in-office visit in order to better assess preoperative cardiovascular risk.  Patient no showed last two office visits this month. In order to receive refills and evaluation she will require in office visit.   Pre-op covering staff: - Please schedule appointment and call patient to inform them. If patient already had an upcoming appointment within acceptable timeframe, please add pre-op clearance to the appointment notes so provider is aware. - Please contact requesting surgeon's office via preferred method (i.e, phone, fax) to inform them of need for appointment prior to surgery.  This message will also be routed to pharmacy pool for input on holding Xarelto  as requested below so that this information is available to the clearing provider at time of patient's appointment.   Mychaela Lennartz D Mally Gavina, NP  05/31/2024, 10:00 AM

## 2024-06-01 ENCOUNTER — Ambulatory Visit: Payer: Self-pay | Admitting: Gastroenterology

## 2024-06-01 DIAGNOSIS — K760 Fatty (change of) liver, not elsewhere classified: Secondary | ICD-10-CM

## 2024-06-01 DIAGNOSIS — D649 Anemia, unspecified: Secondary | ICD-10-CM

## 2024-06-01 NOTE — Telephone Encounter (Signed)
 2ND ATTEMPT to call patient to schedule a televist for a pre-op clearance, no answer left a vm to call back.

## 2024-06-02 ENCOUNTER — Encounter: Payer: Self-pay | Admitting: Family Medicine

## 2024-06-06 NOTE — Telephone Encounter (Signed)
 3rd attempt to contact the pt to schedule OV preop clearance appt, voicemail box full. Will update surgeons office and remove from pool

## 2024-06-11 NOTE — Telephone Encounter (Signed)
 Patient with diagnosis of afib on Xarelto  for anticoagulation.    What type of surgery is being performed?     Colon/EGD   When is this surgery scheduled?     07/05/24    CHA2DS2-VASc Score = 4   This indicates a 4.8% annual risk of stroke. The patient's score is based upon: CHF History: 0 HTN History: 1 Diabetes History: 1 Stroke History: 0 Vascular Disease History: 0 Age Score: 1 Gender Score: 1   CrCl 85 ml/min Platelet count 315 K  Patient has not had an Afib/aflutter ablation within the last 3 months or DCCV within the last 30 days  Per office protocol, patient can hold Xarelto  for 2 days prior to procedure.    Patient will not need bridging with Lovenox (enoxaparin) around procedure.  **This guidance is not considered finalized until pre-operative APP has relayed final recommendations.**

## 2024-06-12 ENCOUNTER — Other Ambulatory Visit

## 2024-06-12 DIAGNOSIS — K760 Fatty (change of) liver, not elsewhere classified: Secondary | ICD-10-CM

## 2024-06-12 DIAGNOSIS — D649 Anemia, unspecified: Secondary | ICD-10-CM

## 2024-06-12 LAB — HEPATIC FUNCTION PANEL
ALT: 14 U/L (ref 0–35)
AST: 11 U/L (ref 0–37)
Albumin: 4.2 g/dL (ref 3.5–5.2)
Alkaline Phosphatase: 124 U/L — ABNORMAL HIGH (ref 39–117)
Bilirubin, Direct: 0.3 mg/dL (ref 0.0–0.3)
Total Bilirubin: 0.7 mg/dL (ref 0.2–1.2)
Total Protein: 7.8 g/dL (ref 6.0–8.3)

## 2024-06-12 LAB — TSH: TSH: 1.36 u[IU]/mL (ref 0.35–5.50)

## 2024-06-12 LAB — PROTIME-INR
INR: 1.5 ratio — ABNORMAL HIGH (ref 0.8–1.0)
Prothrombin Time: 15.5 s — ABNORMAL HIGH (ref 9.6–13.1)

## 2024-06-14 ENCOUNTER — Telehealth: Payer: Self-pay | Admitting: Cardiology

## 2024-06-14 ENCOUNTER — Encounter: Payer: Self-pay | Admitting: Cardiology

## 2024-06-14 LAB — IGG: IgG (Immunoglobin G), Serum: 1668 mg/dL — ABNORMAL HIGH (ref 600–1540)

## 2024-06-14 LAB — ANTI-NUCLEAR AB-TITER (ANA TITER): ANA Titer 1: 1:80 {titer} — ABNORMAL HIGH

## 2024-06-14 LAB — HEPATITIS B SURFACE ANTIBODY,QUALITATIVE: Hep B S Ab: REACTIVE — AB

## 2024-06-14 LAB — HEPATITIS C ANTIBODY: Hepatitis C Ab: NONREACTIVE

## 2024-06-14 LAB — ALPHA-1-ANTITRYPSIN: A-1 Antitrypsin, Ser: 143 mg/dL (ref 83–199)

## 2024-06-14 LAB — ANA: Anti Nuclear Antibody (ANA): POSITIVE — AB

## 2024-06-14 LAB — TISSUE TRANSGLUTAMINASE, IGA: (tTG) Ab, IgA: <1 U/mL

## 2024-06-14 LAB — HEPATITIS B CORE ANTIBODY, TOTAL: Hep B Core Total Ab: REACTIVE — AB

## 2024-06-14 LAB — ANTI-SMOOTH MUSCLE ANTIBODY, IGG: Actin (Smooth Muscle) Antibody (IGG): <20 U (ref <20)

## 2024-06-14 LAB — MITOCHONDRIAL ANTIBODIES: Mitochondrial M2 Ab, IgG: <=20 U (ref <=20.0)

## 2024-06-14 LAB — IGA: Immunoglobulin A: 209 mg/dL (ref 70–320)

## 2024-06-14 LAB — HEPATITIS B SURFACE ANTIGEN: Hepatitis B Surface Ag: NONREACTIVE

## 2024-06-14 LAB — HEPATITIS A ANTIBODY, TOTAL: Hepatitis A AB,Total: REACTIVE — AB

## 2024-06-14 NOTE — Telephone Encounter (Signed)
 See above phone note. Adrien, RN has left message for patient to call back.

## 2024-06-14 NOTE — Telephone Encounter (Signed)
 Pt was calling stating she had abnormal lab results from PCP. She would like a nurse from Dr Pietro to return call please advise

## 2024-06-14 NOTE — Telephone Encounter (Signed)
Unable to reach pt or leave a message mailbox is full 

## 2024-06-15 NOTE — Telephone Encounter (Signed)
 Left message for pt to call.

## 2024-06-16 ENCOUNTER — Other Ambulatory Visit: Payer: Self-pay | Admitting: Adult Health

## 2024-06-16 ENCOUNTER — Other Ambulatory Visit (HOSPITAL_COMMUNITY): Payer: Self-pay

## 2024-06-16 DIAGNOSIS — I4819 Other persistent atrial fibrillation: Secondary | ICD-10-CM

## 2024-06-18 ENCOUNTER — Ambulatory Visit: Payer: Self-pay | Admitting: Gastroenterology

## 2024-06-18 ENCOUNTER — Other Ambulatory Visit (HOSPITAL_COMMUNITY): Payer: Self-pay

## 2024-06-18 ENCOUNTER — Telehealth: Payer: Self-pay | Admitting: Cardiology

## 2024-06-18 DIAGNOSIS — R748 Abnormal levels of other serum enzymes: Secondary | ICD-10-CM

## 2024-06-18 DIAGNOSIS — R7689 Other specified abnormal immunological findings in serum: Secondary | ICD-10-CM

## 2024-06-18 MED ORDER — FLECAINIDE ACETATE 100 MG PO TABS
100.0000 mg | ORAL_TABLET | Freq: Two times a day (BID) | ORAL | 0 refills | Status: DC
Start: 2024-06-18 — End: 2024-06-26
  Filled 2024-06-18 – 2024-06-20 (×2): qty 60, 30d supply, fill #0

## 2024-06-18 NOTE — Telephone Encounter (Signed)
 Mychart message sent to patient.

## 2024-06-18 NOTE — Telephone Encounter (Signed)
 Patient is returning phone call.

## 2024-06-18 NOTE — Telephone Encounter (Signed)
*  STAT* If patient is at the pharmacy, call can be transferred to refill team.   1. Which medications need to be refilled? (please list name of each medication and dose if known)   flecainide  (TAMBOCOR ) 100 MG tablet  Take 1 tablet (100 mg total) by mouth 2 (two) times daily.    2. Would you like to learn more about the convenience, safety, & potential cost savings by using the [facility]? No    3. Are you open to using the [facility] No   4. Which pharmacy/location (including street and city if local pharmacy) is medication to be sent to?[place] [facility]    5. Do they need a 30 day or 90 day supply? 90 Day Supply  Pt is completely out of medication. Sch'd with Rollo Louder, PA on 06/26/24

## 2024-06-18 NOTE — Telephone Encounter (Signed)
 Returned call to pt. Left a detailed message for her that she can get her 90 day refills of Flecainide  when she comes in for her follow-up appointment 06/26/24.

## 2024-06-19 NOTE — Progress Notes (Signed)
 Cardiology Office Note   Date:  06/26/2024  ID:  [name], DOB 1956-01-19, MRN [id number] PCP: Frann Mabel Mt, DO  [facility] Providers Cardiologist:  Redell Shallow, MD    History of Present Illness Monica [name] is a 68 y.o. female with past medical history of persistent atrial fibrillation, hypertension, lower extremity edema, obesity.  Followed by Dr. Shallow, presents today for 46-month follow-up appointment.  Patient previously had cardioversion in 2014 with conversion to sinus rhythm.  Later had recurrent atrial fibrillation and was started on flecainide .  Underwent successful cardioversion in 12/2021.  Recommended exercise tolerance test.  She presented for exercise tolerance test in 12/2021 but was noted to be back in atrial fibrillation.  Exercise tolerance test was postponed.  In 10/2022, patient was found to be back in normal sinus rhythm.  Underwent ETT in 10/2022 that showed no significant arrhythmias but was overall nondiagnostic as she did not achieve target heart rate.  Echocardiogram in 09/2021 showed EF 55-60%, no regional wall motion abnormalities, normal RV systolic function, mildly enlarged RV, normal PA systolic pressure, severe left atrial dilation, moderate right atrial dilation, mild MR.   Today, patient presents for her annual cardiology appointment.  She has been following with GI recently.  Has been having issues swallowing.  Feels like when she swallows, the food moves too slowly and gets caught in her throat.  She is scheduled for an EGD and colonoscopy.  GI also drew several labs, some of which were abnormal causing her some stress.  Patient believes she has been doing well from a cardiac standpoint.  She denies chest pain or dyspnea on exertion.  She denies palpitations, tachycardia.  She previously had been on flecainide  100 mg twice daily.  Had issues with lightheadedness and dizziness while taking this twice a day.  She reduced her dose to 100  mg daily and since then has had resolution of her dizziness.  She is in atrial flutter today.  Heart rate is well-controlled.  She usually does not take her Nebivolol  and flecainide  until afternoon, so she has not taken her medicines yet today.  She wonders if this is contributing.  She is unable to tell that she is in atrial flutter.  She has chronic lower extremity swelling which has been stable.  Patient is compliant with her Xarelto  and denies bleeding or bruising.  Studies Reviewed      Cardiac Studies & Procedures   ______________________________________________________________________________________________   STRESS TESTS  EXERCISE TOLERANCE TEST (ETT) 10/20/2022  Interpretation Summary   The patient exercised according to the BRUCE protocol for 4:31min achieving 5.8 METs   Target HR was not achieved (max HR 103bpm; 66% MPHR)   Baseline ECG with sinus bradycardia with episodes of AVB and baseline T-wave inversions   Study nondiagnostic due to resting T-wave inversions on baseline ECG abnormalities and target HR not achieved.   No exercise induced arrhythmias or QRS widening noted during stress testing   ECHOCARDIOGRAM  ECHOCARDIOGRAM COMPLETE 09/18/2021  Narrative ECHOCARDIOGRAM REPORT    Patient Name:   [name] Date of Exam: 09/18/2021 Medical Rec #:  [id number]      Height:       65.0 in Accession #:    [id number]     Weight:       242.4 lb Date of Birth:  05/15/1956      BSA:          2.147 m Patient Age:    76 years  BP:           136/77 mmHg Patient Gender: F              HR:           81 bpm. Exam Location:  Outpatient  Procedure: 2D Echo, Cardiac Doppler and Color Doppler  Indications:    R06.02 SOB; I48.91* Unspeicified atrial fibrillation  History:        Patient has prior history of Echocardiogram examinations, most recent 09/27/2019. Arrythmias:Atrial Fibrillation, Signs/Symptoms:Shortness of Breath and Edema; Risk Factors:Hypertension, Sleep Apnea,  Diabetes and Former Smoker. Patient denies chest pain but has had SOB with bilateral leg swelling for about 2 months.  Sonographer:    Annabella Cater RVT, RDCS (AE), RDMS Referring Phys: [id number] BRIAN S CRENSHAW   Sonographer Comments: Patient is morbidly obese and suboptimal parasternal window. Image acquisition challenging due to respiratory motion and Image acquisition challenging due to patient body habitus. IMPRESSIONS   1. Left ventricular ejection fraction, by estimation, is 55 to 60%. The left ventricle has normal function. The left ventricle has no regional wall motion abnormalities. Left ventricular diastolic function could not be evaluated. 2. Right ventricular systolic function is normal. The right ventricular size is mildly enlarged. There is normal pulmonary artery systolic pressure. 3. Left atrial size was severely dilated. 4. Right atrial size was moderately dilated. 5. The mitral valve is grossly normal. Mild mitral valve regurgitation. 6. Tricuspid valve regurgitation is mild to moderate. 7. The aortic valve is grossly normal. Aortic valve regurgitation is not visualized. No aortic stenosis is present. 8. The inferior vena cava is normal in size with greater than 50% respiratory variability, suggesting right atrial pressure of 3 mmHg.  Comparison(s): No significant change from prior study.  FINDINGS Left Ventricle: Left ventricular ejection fraction, by estimation, is 55 to 60%. The left ventricle has normal function. The left ventricle has no regional wall motion abnormalities. The left ventricular internal cavity size was normal in size. There is no left ventricular hypertrophy. Left ventricular diastolic function could not be evaluated due to atrial fibrillation. Left ventricular diastolic function could not be evaluated.  Right Ventricle: The right ventricular size is mildly enlarged. Right vetricular wall thickness was not well visualized. Right ventricular systolic  function is normal. There is normal pulmonary artery systolic pressure. The tricuspid regurgitant velocity is 2.58 m/s, and with an assumed right atrial pressure of 3 mmHg, the estimated right ventricular systolic pressure is 29.6 mmHg.  Left Atrium: Left atrial size was severely dilated.  Right Atrium: Right atrial size was moderately dilated.  Pericardium: There is no evidence of pericardial effusion.  Mitral Valve: The mitral valve is grossly normal. Mild mitral valve regurgitation.  Tricuspid Valve: The tricuspid valve is grossly normal. Tricuspid valve regurgitation is mild to moderate. No evidence of tricuspid stenosis.  Aortic Valve: The aortic valve is grossly normal. Aortic valve regurgitation is not visualized. No aortic stenosis is present. Aortic valve mean gradient measures 4.0 mmHg. Aortic valve peak gradient measures 8.2 mmHg. Aortic valve area, by VTI measures 1.82 cm.  Pulmonic Valve: The pulmonic valve was not well visualized. Pulmonic valve regurgitation is trivial. No evidence of pulmonic stenosis.  Aorta: The aortic root, ascending aorta, aortic arch and descending aorta are all structurally normal, with no evidence of dilitation or obstruction.  Venous: The inferior vena cava is normal in size with greater than 50% respiratory variability, suggesting right atrial pressure of 3 mmHg.  IAS/Shunts: The atrial septum is grossly  normal.   LEFT VENTRICLE PLAX 2D LVIDd:         5.58 cm     Diastology LVIDs:         3.49 cm     LV e' medial:    8.59 cm/s LV PW:         1.09 cm     LV E/e' medial:  10.9 LV IVS:        0.81 cm     LV e' lateral:   12.00 cm/s LVOT diam:     2.00 cm     LV E/e' lateral: 7.8 LV SV:         48 LV SV Index:   23 LVOT Area:     3.14 cm  3D Volume EF: LV Volumes (MOD)           3D EF:        55 % LV vol d, MOD A2C: 57.8 ml LV EDV:       112 ml LV vol d, MOD A4C: 74.3 ml LV ESV:       50 ml LV vol s, MOD A2C: 24.6 ml LV SV:        62  ml LV vol s, MOD A4C: 36.8 ml LV SV MOD A2C:     33.2 ml LV SV MOD A4C:     74.3 ml LV SV MOD BP:      37.1 ml  RIGHT VENTRICLE RV S prime:     14.70 cm/s TAPSE (M-mode): 2.3 cm  LEFT ATRIUM              Index        RIGHT ATRIUM           Index LA diam:        4.70 cm  2.19 cm/m   RA Area:     28.60 cm LA Vol (A2C):   142.0 ml 66.15 ml/m  RA Volume:   103.00 ml 47.98 ml/m LA Vol (A4C):   87.8 ml  40.90 ml/m LA Biplane Vol: 119.0 ml 55.44 ml/m AORTIC VALVE                    PULMONIC VALVE AV Area (Vmax):    1.80 cm     PV Vmax:          0.89 m/s AV Area (Vmean):   1.90 cm     PV Peak grad:     3.2 mmHg AV Area (VTI):     1.82 cm     PR End Diast Vel: 5.76 msec AV Vmax:           143.33 cm/s AV Vmean:          92.433 cm/s AV VTI:            0.265 m AV Peak Grad:      8.2 mmHg AV Mean Grad:      4.0 mmHg LVOT Vmax:         82.10 cm/s LVOT Vmean:        55.800 cm/s LVOT VTI:          0.154 m LVOT/AV VTI ratio: 0.58  AORTA Ao Root diam: 2.90 cm Ao Asc diam:  3.00 cm Ao Arch diam: 3.1 cm  MITRAL VALVE               TRICUSPID VALVE MV Area (PHT): 4.36 cm    TR Peak grad:  26.6 mmHg MV Decel Time: 174 msec    TR Vmax:        258.00 cm/s MR Peak grad: 96.8 mmHg MR Mean grad: 64.0 mmHg    SHUNTS MR Vmax:      492.00 cm/s  Systemic VTI:  0.15 m MR Vmean:     371.0 cm/s   Systemic Diam: 2.00 cm MV E velocity: 94.00 cm/s  Shelda Bruckner MD Electronically signed by Shelda Bruckner MD Signature Date/Time: 09/18/2021/6:19:46 PM    Final          ______________________________________________________________________________________________      Risk Assessment/Calculations  CHA2DS2-VASc Score = 4   This indicates a 4.8% annual risk of stroke. The patient's score is based upon: CHF History: 0 HTN History: 1 Diabetes History: 1 Stroke History: 0 Vascular Disease History: 0 Age Score: 1 Gender Score: 1        Physical Exam VS:  BP 134/80    Pulse 86   Ht 5' 5.5 (1.664 m)   Wt 248 lb 3.2 oz (112.6 kg)   LMP 06/19/2013   SpO2 96%   BMI 40.67 kg/m        Wt Readings from Last 3 Encounters:  06/26/24 248 lb 3.2 oz (112.6 kg)  05/30/24 247 lb 6 oz (112.2 kg)  03/14/24 251 lb (113.9 kg)    GEN: Well nourished, well developed in no acute distress. Sitting comfortably in the chair  NECK: No JVD  CARDIAC:  Irregular rate and rhythm, no murmurs, rubs, gallops. Radial pulses 2+ bilaterally  RESPIRATORY:  Clear to auscultation without rales, wheezing or rhonchi. Normal WOB on room air   ABDOMEN: Soft, non-tender, non-distended EXTREMITIES:  Trace edema in L>R; No deformity   ASSESSMENT AND PLAN  Paroxysmal Atrial Fibrillation  Newly diagnosed atrial flutter - Patient had previously been on flecainide  100 mg twice daily.  She had dizziness/lightheadedness on this dose.  She decreased her dose to 100 mg daily.  Dizziness has resolved - Exercise tolerance test in 10/2022 showed no significant arrhythmias - EKG today shows atrial flutter with variable AV block, heart rate 71 bpm -Patient denies palpitations, tachycardia.  Unable to tell that she is in atrial flutter today -Patient has not yet taken her Nebivolol  or flecainide  today.  She wonders if this could be contributing to her being in atrial flutter today  - Ordered 7-day ZIO to assess burden of atrial fibrillation.  Pending monitor results, consider atrial fibrillation clinic/EP referral to discuss alternative rhythm control strategies since she cannot tolerate higher doses of flecainide   - She is pending EGD/colonoscopy on 11/20.  Placed monitor so would be completed before her procedure.  She will have to hold her Xarelto  for 2 days prior to this. OK per pharmacy  - Continue xarelto  20 mg daily. Patient denies bleeding/bruising. Hemoglobin 12.1 in 05/2024    - Continue flecainide  100 mg daily, nebivolol  5 mg daily   HTN  - BP well controlled  - Continue nebivolol  5 mg  daily and olmesartan  20 mg daily  - K 3.5, creatinine 0.81 in 05/2024   Chronic lower extremity swelling  - Echocardiogram 09/2021 showed EF 55-60%, normal RV function - Swelling mild on exam today and does not bother her. Usually wears compression socks   Mild MR - Noted on echocardiogram in 09/2021.  Anticipate echocardiogram for routine monitoring in 1-2 years - No SOB, orthopnea, worsening LE edema    Dispo: Follow up with Dr. Pietro in 6 months. Will arrange  afib/EP referral pending monitor results   Signed, Rollo FABIENE Louder, PA-C

## 2024-06-20 ENCOUNTER — Other Ambulatory Visit (HOSPITAL_COMMUNITY): Payer: Self-pay

## 2024-06-20 ENCOUNTER — Ambulatory Visit: Admitting: Family Medicine

## 2024-06-25 ENCOUNTER — Other Ambulatory Visit (HOSPITAL_COMMUNITY): Payer: Self-pay

## 2024-06-25 MED ORDER — FLUZONE HIGH-DOSE 0.5 ML IM SUSY
0.5000 mL | PREFILLED_SYRINGE | Freq: Once | INTRAMUSCULAR | 0 refills | Status: AC
  Filled 2024-06-25: qty 0.5, 1d supply, fill #0

## 2024-06-26 ENCOUNTER — Ambulatory Visit

## 2024-06-26 ENCOUNTER — Other Ambulatory Visit (HOSPITAL_COMMUNITY): Payer: Self-pay

## 2024-06-26 ENCOUNTER — Ambulatory Visit: Attending: Cardiology | Admitting: Cardiology

## 2024-06-26 ENCOUNTER — Encounter: Payer: Self-pay | Admitting: Cardiology

## 2024-06-26 VITALS — BP 134/80 | HR 86 | Ht 65.5 in | Wt 248.2 lb

## 2024-06-26 DIAGNOSIS — I4819 Other persistent atrial fibrillation: Secondary | ICD-10-CM | POA: Diagnosis not present

## 2024-06-26 DIAGNOSIS — R600 Localized edema: Secondary | ICD-10-CM | POA: Diagnosis not present

## 2024-06-26 DIAGNOSIS — I480 Paroxysmal atrial fibrillation: Secondary | ICD-10-CM

## 2024-06-26 DIAGNOSIS — E1159 Type 2 diabetes mellitus with other circulatory complications: Secondary | ICD-10-CM | POA: Diagnosis not present

## 2024-06-26 DIAGNOSIS — I152 Hypertension secondary to endocrine disorders: Secondary | ICD-10-CM | POA: Diagnosis not present

## 2024-06-26 DIAGNOSIS — I4892 Unspecified atrial flutter: Secondary | ICD-10-CM

## 2024-06-26 MED ORDER — NEBIVOLOL HCL 10 MG PO TABS
5.0000 mg | ORAL_TABLET | Freq: Every day | ORAL | 1 refills | Status: AC
  Filled 2024-06-26 – 2024-07-30 (×2): qty 45, 90d supply, fill #0

## 2024-06-26 MED ORDER — FLECAINIDE ACETATE 100 MG PO TABS
100.0000 mg | ORAL_TABLET | Freq: Every day | ORAL | 1 refills | Status: AC
  Filled 2024-06-26: qty 90, fill #0
  Filled 2024-09-21: qty 90, 90d supply, fill #0

## 2024-06-26 MED ORDER — RIVAROXABAN 20 MG PO TABS
20.0000 mg | ORAL_TABLET | Freq: Every day | ORAL | 1 refills | Status: AC
  Filled 2024-06-26 – 2024-08-03 (×3): qty 90, 90d supply, fill #0

## 2024-06-26 NOTE — Patient Instructions (Addendum)
 Thank you for choosing [facility]!     Medication Instructions:  Your refills have been sent to your pharmacy *If you need a refill on your cardiac medications before your next appointment, please call your pharmacy*   Lab Work: No labs were ordered during today's visit.  If you have labs (blood work) drawn today and your tests are completely normal, you will receive your results only by: MyChart Message (if you have MyChart) OR A paper copy in the mail If you have any lab test that is abnormal or we need to change your treatment, we will call you to review the results.   Testing/Procedures:  ZIO XT- Long Term Monitor Instructions   Your physician has requested you wear your ZIO patch monitor___7____days.   This is a single patch monitor.  [employer] supplies one patch monitor per enrollment.  Additional stickers are not available.   Please do not apply patch if you will be having a Nuclear Stress Test, Echocardiogram, Cardiac CT, MRI, or Chest Xray during the time frame you would be wearing the monitor. The patch cannot be worn during these tests.  You cannot remove and re-apply the ZIO XT patch monitor.   Your ZIO patch monitor will be sent USPS Priority mail from [facility] directly to your home address. The monitor may also be mailed to a PO BOX if home delivery is not available.   It may take 3-5 days to receive your monitor after you have been enrolled.   Once you have received you monitor, please review enclosed instructions.  Your monitor has already been registered assigning a specific monitor serial # to you.   Applying the monitor   Shave hair from upper left chest.   Hold abrader disc by orange tab.  Rub abrader in 40 strokes over left upper chest as indicated in your monitor instructions.   Clean area with 4 enclosed alcohol  pads .  Use all pads to assure are is cleaned thoroughly.  Let dry.   Apply patch as indicated in monitor instructions.   Patch will be place under collarbone on left side of chest with arrow pointing upward.   Rub patch adhesive wings for 2 minutes.Remove white label marked 1.  Remove white label marked 2.  Rub patch adhesive wings for 2 additional minutes.   While looking in a mirror, press and release button in center of patch.  A small green light will flash 3-4 times .  This will be your only indicator the monitor has been turned on.     Do not shower for the first 24 hours.  You may shower after the first 24 hours.   Press button if you feel a symptom. You will hear a small click.  Record Date, Time and Symptom in the Patient Log Book.   When you are ready to remove patch, follow instructions on last 2 pages of Patient Log Book.  Stick patch monitor onto last page of Patient Log Book.   Place Patient Log Book in [place] box.  Use locking tab on box and tape box closed securely.  The [employer] has [employer] on it.  Please place in mailbox as soon as possible.  Your physician should have your test results approximately 7 days after the monitor has been mailed back to [facility].   Call [facility] Customer Care at [phone] if you have questions regarding your ZIO XT patch monitor.  Call them immediately if you see an  orange light blinking on your monitor.   If your monitor falls off in less than 4 days contact our Monitor department at [phone].  If your monitor becomes loose or falls off after 4 days call [employer] at [phone] for suggestions on securing your monitor.    Your next appointment:   6 month(s)   Provider:   Redell Shallow, MD     Follow-Up: At [facility], you and your health needs are our priority.  As part of our continuing mission to provide you with exceptional heart care, we have created designated Provider Care Teams.  These Care Teams include your primary Cardiologist (physician) and Advanced Practice Providers (APPs -  Physician  Assistants and Nurse Practitioners) who all work together to provide you with the care you need, when you need it. We recommend signing up for the patient portal called MyChart.  Sign up information is provided on this After Visit Summary.  MyChart is used to connect with patients for Virtual Visits (Telemedicine).  Patients are able to view lab/test results, encounter notes, upcoming appointments, etc.  Non-urgent messages can be sent to your provider as well.   To learn more about what you can do with MyChart, go to [email or web address].

## 2024-06-26 NOTE — Progress Notes (Unsigned)
 [id number] from office inventory applied to patient. Dr. Pietro to read.

## 2024-06-27 ENCOUNTER — Other Ambulatory Visit (INDEPENDENT_AMBULATORY_CARE_PROVIDER_SITE_OTHER)

## 2024-06-27 DIAGNOSIS — R748 Abnormal levels of other serum enzymes: Secondary | ICD-10-CM | POA: Diagnosis not present

## 2024-06-27 DIAGNOSIS — L400 Psoriasis vulgaris: Secondary | ICD-10-CM | POA: Diagnosis not present

## 2024-06-27 LAB — VITAMIN D 25 HYDROXY (VIT D DEFICIENCY, FRACTURES): VITD: 26.03 ng/mL — ABNORMAL LOW (ref 30.00–100.00)

## 2024-06-30 LAB — ALKALINE PHOSPHATASE ISOENZYMES
Alkaline phosphatase (APISO): 126 U/L (ref 37–153)
Bone Isoenzymes: 39 % (ref 28–66)
Intestinal Isoenzymes: 0 % — ABNORMAL LOW (ref 1–24)
Liver Isoenzymes: 61 % (ref 25–69)
Macrohepatic isoenzymes: 0 % (ref <=0)
Placental isoenzymes: 0 % (ref <=0)

## 2024-06-30 LAB — PTH, INTACT AND CALCIUM
Calcium: 9.1 mg/dL (ref 8.6–10.4)
PTH: 88 pg/mL — ABNORMAL HIGH (ref 16–77)

## 2024-07-02 ENCOUNTER — Other Ambulatory Visit: Payer: Self-pay

## 2024-07-02 ENCOUNTER — Encounter: Payer: Self-pay | Admitting: Family Medicine

## 2024-07-02 ENCOUNTER — Other Ambulatory Visit (HOSPITAL_COMMUNITY): Payer: Self-pay

## 2024-07-02 ENCOUNTER — Ambulatory Visit: Admitting: Family Medicine

## 2024-07-02 VITALS — BP 138/82 | HR 100 | Temp 98.0°F | Resp 16 | Ht 65.0 in | Wt 245.6 lb

## 2024-07-02 DIAGNOSIS — R7689 Other specified abnormal immunological findings in serum: Secondary | ICD-10-CM

## 2024-07-02 DIAGNOSIS — Z7985 Long-term (current) use of injectable non-insulin antidiabetic drugs: Secondary | ICD-10-CM

## 2024-07-02 DIAGNOSIS — E1165 Type 2 diabetes mellitus with hyperglycemia: Secondary | ICD-10-CM

## 2024-07-02 MED ORDER — TRULICITY 4.5 MG/0.5ML ~~LOC~~ SOAJ
4.5000 mg | SUBCUTANEOUS | 2 refills | Status: AC
  Filled 2024-07-02: qty 6, 84d supply, fill #0
  Filled 2024-09-21: qty 2, 28d supply, fill #1
  Filled 2024-09-21: qty 6, 84d supply, fill #1

## 2024-07-02 NOTE — Progress Notes (Signed)
 Subjective:   Chief Complaint  Patient presents with   Medication Problem    Medication check    Monica [name] is a 68 y.o. female here for follow-up of diabetes.   Monica [name] does not routinely monitor her sugars Patient does not require insulin .   Medications include: Trulicity  3 mg/week Diet is poor.  Exercise: none No CP or SOB.   Has a lot of questions about recent labs done through GI.  She has an upper endoscopy and colonoscopy scheduled in 3 days.  She wonders if she still needs this.  Past Medical History:  Diagnosis Date   Anxiety    Atrial fibrillation (HCC)    a. s/p DCCV in 12/2012 b. 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   History of AVN   Hypertension    Morbid obesity (HCC)    Osteoarthritis    Personal history of colonic polyps - large hyperplastic 12/25/2013   Psoriasis    active breakout left buttocks   Sleep apnea    uses mouth guard only   Type 2 diabetes mellitus with morbid obesity (HCC)      Related testing: Retinal exam: Due Pneumovax: done  Objective:  BP 138/82 (BP Location: Left Arm, Patient Position: Sitting)   Pulse 100   Temp 98 F (36.7 C) (Oral)   Resp 16   Ht 5' 5 (1.651 m)   Wt 245 lb 9.6 oz (111.4 kg)   LMP 06/19/2013   SpO2 95%   BMI 40.87 kg/m  General:  Well developed, well nourished, in no apparent distress Lungs:  CTAB, no access msc use Cardio:  RRR, no bruits, no LE edema Psych: Age appropriate judgment and insight  Assessment:   Type 2 diabetes mellitus with hyperglycemia, without long-term current use of insulin  (HCC) - Plan: Dulaglutide  (TRULICITY ) 4.5 MG/0.5ML SOAJ  Positive ANA (antinuclear antibody)  Hepatitis B core antibody positive   Plan:   Chronic, not controlled.  Increase Trulicity  from 3 mg weekly to 4.5 mg weekly.  Follow-up in 2 months for her medication review where we will check her blood work.   Counseled on diet and exercise. Reassurance. We discussed what this means.  GI is working this up further. F/u in 2 mo. The patient voiced understanding and agreement to the plan.  Mabel Mt Marbury, DO 07/02/24 5:10 PM

## 2024-07-02 NOTE — Patient Instructions (Signed)
 Keep the diet clean and stay active.  Let me know if there are cost issues.  Let us know if you need anything.

## 2024-07-04 NOTE — Telephone Encounter (Signed)
 Per office protocol, patient can hold Xarelto  for 2 days prior to procedure. Patient was informed of this information at her office visit with Cardiology and confirmed that she has been holding her Xarelto .

## 2024-07-05 ENCOUNTER — Encounter: Payer: Self-pay | Admitting: Internal Medicine

## 2024-07-05 ENCOUNTER — Ambulatory Visit (AMBULATORY_SURGERY_CENTER): Admitting: Internal Medicine

## 2024-07-05 VITALS — BP 123/85 | HR 78 | Temp 96.8°F | Resp 15 | Ht 65.0 in | Wt 247.6 lb

## 2024-07-05 DIAGNOSIS — F419 Anxiety disorder, unspecified: Secondary | ICD-10-CM | POA: Diagnosis not present

## 2024-07-05 DIAGNOSIS — K6289 Other specified diseases of anus and rectum: Secondary | ICD-10-CM | POA: Diagnosis not present

## 2024-07-05 DIAGNOSIS — R1310 Dysphagia, unspecified: Secondary | ICD-10-CM

## 2024-07-05 DIAGNOSIS — G4730 Sleep apnea, unspecified: Secondary | ICD-10-CM | POA: Diagnosis not present

## 2024-07-05 DIAGNOSIS — Z1211 Encounter for screening for malignant neoplasm of colon: Secondary | ICD-10-CM

## 2024-07-05 DIAGNOSIS — I4891 Unspecified atrial fibrillation: Secondary | ICD-10-CM | POA: Diagnosis not present

## 2024-07-05 DIAGNOSIS — K2970 Gastritis, unspecified, without bleeding: Secondary | ICD-10-CM

## 2024-07-05 DIAGNOSIS — Z860100 Personal history of colon polyps, unspecified: Secondary | ICD-10-CM

## 2024-07-05 DIAGNOSIS — R1319 Other dysphagia: Secondary | ICD-10-CM

## 2024-07-05 DIAGNOSIS — D124 Benign neoplasm of descending colon: Secondary | ICD-10-CM | POA: Diagnosis not present

## 2024-07-05 MED ORDER — SODIUM CHLORIDE 0.9 % IV SOLN: 500.0000 mL | Freq: Once | INTRAVENOUS | Status: DC

## 2024-07-05 NOTE — Op Note (Signed)
 [facility] Patient Name: Monica [name] Procedure Date: 07/05/2024 1:00 PM MRN: [id number] Endoscopist: Lupita FORBES Commander , MD, [id number] Age: 68 Referring MD:  Date of Birth: 1956/02/25 Gender: Female Account #: [id number] Procedure:                Upper GI endoscopy Indications:              Dysphagia Medicines:                Monitored Anesthesia Care Procedure:                Pre-Anesthesia Assessment:                           - Prior to the procedure, a History and Physical                            was performed, and patient medications and                            allergies were reviewed. The patient's tolerance of                            previous anesthesia was also reviewed. The risks                            and benefits of the procedure and the sedation                            options and risks were discussed with the patient.                            All questions were answered, and informed consent                            was obtained. Prior Anticoagulants: The patient                            last took Xarelto  (rivaroxaban ) 2 days prior to the                            procedure. ASA Grade Assessment: III - A patient                            with severe systemic disease. After reviewing the                            risks and benefits, the patient was deemed in                            satisfactory condition to undergo the procedure.                           After obtaining informed consent, the endoscope was  passed under direct vision. Throughout the                            procedure, the patient's blood pressure, pulse, and                            oxygen saturations were monitored continuously. The                            Olympus Scope [id number] was introduced through the                            mouth, and advanced to the second part of duodenum.                            The upper GI endoscopy  was accomplished without                            difficulty. The patient tolerated the procedure                            well. Scope In: Scope Out: Findings:                 Localized moderate inflammation characterized by                            congestion (edema), erosions and erythema was found                            in the prepyloric region of the stomach. Biopsies                            were taken with a cold forceps for histology.                            Verification of patient identification for the                            specimen was done. Estimated blood loss was minimal.                           The exam was otherwise without abnormality.                           The cardia and gastric fundus were normal on                            retroflexion.                           The scope was withdrawn. Dilation was performed in                            the entire esophagus with a Maloney dilator with  mild resistance at 54 Fr. Estimated blood loss:                            none. Re-inspection showed no change. Complications:            No immediate complications. Estimated Blood Loss:     Estimated blood loss was minimal. Impression:               - Gastritis. Biopsied.                           - The examination was otherwise normal.                           - Dilation performed in the entire esophagus. 54                            fr. Maloney dilator Recommendation:           - Patient has a contact number available for                            emergencies. The signs and symptoms of potential                            delayed complications were discussed with the                            patient. Return to normal activities tomorrow.                            Written discharge instructions were provided to the                            patient.                           - Clear liquids x 1 hour then soft foods rest  of                            day. Start prior diet tomorrow.                           - Continue present medications.                           - Await pathology results.                           - See the other procedure note for documentation of                            additional recommendations. Lupita FORBES Commander, MD 07/05/2024 1:56:02 PM This report has been signed electronically.

## 2024-07-05 NOTE — Op Note (Signed)
 [facility] Patient Name: Monica [name] Procedure Date: 07/05/2024 12:59 PM MRN: [id number] Endoscopist: Lupita FORBES Commander , MD, [id number] Age: 68 Referring MD:  Date of Birth: 1956/05/02 Gender: Female Account #: [id number] Procedure:                Colonoscopy Indications:              High risk colon cancer surveillance: Personal                            history of colonic polyps, Last colonoscopy: 2015 Medicines:                Monitored Anesthesia Care Procedure:                Pre-Anesthesia Assessment:                           - Prior to the procedure, a History and Physical                            was performed, and patient medications and                            allergies were reviewed. The patient's tolerance of                            previous anesthesia was also reviewed. The risks                            and benefits of the procedure and the sedation                            options and risks were discussed with the patient.                            All questions were answered, and informed consent                            was obtained. Prior Anticoagulants: The patient                            last took Xarelto  (rivaroxaban ) 2 days prior to the                            procedure. ASA Grade Assessment: III - A patient                            with severe systemic disease. After reviewing the                            risks and benefits, the patient was deemed in                            satisfactory condition to undergo the procedure.  After obtaining informed consent, the colonoscope                            was passed under direct vision. Throughout the                            procedure, the patient's blood pressure, pulse, and                            oxygen saturations were monitored continuously. The                            CF [id number] #[id number] was introduced through the anus                             and advanced to the the cecum, identified by                            appendiceal orifice and ileocecal valve. The                            colonoscopy was performed without difficulty. The                            patient tolerated the procedure well. The quality                            of the bowel preparation was good. The ileocecal                            valve, appendiceal orifice, and rectum were                            photographed. The bowel preparation used was SUPREP                            via split dose instruction. Scope In: 1:36:17 PM Scope Out: 1:48:34 PM Scope Withdrawal Time: 0 hours 8 minutes 43 seconds  Total Procedure Duration: 0 hours 12 minutes 17 seconds  Findings:                 The perianal and digital rectal examinations were                            normal.                           Two sessile polyps were found in the proximal                            descending colon. The polyps were 5 mm in size.                            These polyps were removed with a cold snare.  Resection and retrieval were complete. Verification                            of patient identification for the specimen was                            done. Estimated blood loss was minimal.                           Anal papilla(e) were hypertrophied.                           The exam was otherwise without abnormality on                            direct and retroflexion views. Complications:            No immediate complications. Estimated Blood Loss:     Estimated blood loss was minimal. Impression:               - Two 5 mm polyps in the proximal descending colon,                            removed with a cold snare. Resected and retrieved.                           - Anal papilla(e) were hypertrophied.                           - The examination was otherwise normal on direct                            and retroflexion  views. Recommendation:           - Patient has a contact number available for                            emergencies. The signs and symptoms of potential                            delayed complications were discussed with the                            patient. Return to normal activities tomorrow.                            Written discharge instructions were provided to the                            patient.                           - Resume previous diet.                           - Continue present medications.                           -  Repeat colonoscopy is recommended. The                            colonoscopy date will be determined after pathology                            results from today's exam become available for                            review. Lupita FORBES Commander, MD 07/05/2024 1:59:58 PM This report has been signed electronically.

## 2024-07-05 NOTE — Patient Instructions (Addendum)
 The stomach had some areas of possible inflammation so I took biopsies of that and I dilated the esophagus to help you swallow better.  The colonoscopy revealed 2 polyps that were small and appear benign.  I will have them analyzed and let you know what that means and when to repeat a colonoscopy.  Resume Xarelto  tomorrow.  I appreciate the opportunity to care for you. Monica CHARLENA Commander, MD, FACG   YOU HAD AN ENDOSCOPIC PROCEDURE TODAY AT THE [facility]:   Refer to the procedure report that was given to you for any specific questions about what was found during the examination.  If the procedure report does not answer your questions, please call your gastroenterologist to clarify.  If you requested that your care partner not be given the details of your procedure findings, then the procedure report has been included in a sealed envelope for you to review at your convenience later.  YOU SHOULD EXPECT: Some feelings of bloating in the abdomen. Passage of more gas than usual.  Walking can help get rid of the air that was put into your GI tract during the procedure and reduce the bloating. If you had a lower endoscopy (such as a colonoscopy or flexible sigmoidoscopy) you may notice spotting of blood in your stool or on the toilet paper. If you underwent a bowel prep for your procedure, you may not have a normal bowel movement for a few days.  Please Note:  You might notice some irritation and congestion in your nose or some drainage.  This is from the oxygen used during your procedure.  There is no need for concern and it should clear up in a day or so.  SYMPTOMS TO REPORT IMMEDIATELY:  Following lower endoscopy (colonoscopy or flexible sigmoidoscopy):  Excessive amounts of blood in the stool  Significant tenderness or worsening of abdominal pains  Swelling of the abdomen that is new, acute  Fever of 100F or higher  Following upper endoscopy (EGD)  Vomiting of blood or coffee ground  material  New chest pain or pain under the shoulder blades  Painful or persistently difficult swallowing  New shortness of breath  Fever of 100F or higher  Black, tarry-looking stools  For urgent or emergent issues, a gastroenterologist can be reached at any hour by calling [phone]. Do not use MyChart messaging for urgent concerns.    DIET:  Dilation diet today, but then you may proceed to your regular diet tomorrow.  Drink plenty of fluids but you should avoid alcoholic beverages for 24 hours.  ACTIVITY:  You should plan to take it easy for the rest of today and you should NOT DRIVE or use heavy machinery until tomorrow (because of the sedation medicines used during the test).    FOLLOW UP: Our staff will call the number listed on your records the next business day following your procedure.  We will call around 7:15- 8:00 am to check on you and address any questions or concerns that you may have regarding the information given to you following your procedure. If we do not reach you, we will leave a message.     If any biopsies were taken you will be contacted by phone or by letter within the next 1-3 weeks.  Please call us  at [phone] if you have not heard about the biopsies in 3 weeks.    SIGNATURES/CONFIDENTIALITY: You and/or your care partner have signed paperwork which will be entered into your electronic medical  record.  These signatures attest to the fact that that the information above on your After Visit Summary has been reviewed and is understood.  Full responsibility of the confidentiality of this discharge information lies with you and/or your care-partner.

## 2024-07-05 NOTE — Progress Notes (Signed)
 [facility] History and Physical   Primary Care Physician:  Frann Mabel Mt, DO   Reason for Procedure:    Encounter Diagnoses  Name Primary?   History of colonic polyps Yes   Esophageal dysphagia      Plan:    EGD, possible dilation and colonoscopy   The patient was provided an opportunity to ask questions and all were answered. The patient agreed with the plan.   HPI: Monica [name] is a 68 y.o. female here for evaluation of dysphagia and hx colon polyps.  Xarelto  has been held.  Seen in clinic in October by [name], PA-C. Please see that note also.   Past Medical History:  Diagnosis Date   Anxiety    Atrial fibrillation (HCC)    a. s/p DCCV in 12/2012 b. 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   History of AVN   Hypertension    Morbid obesity (HCC)    Osteoarthritis    Personal history of colonic polyps - large hyperplastic 12/25/2013   Psoriasis    active breakout left buttocks   Sleep apnea    uses mouth guard only   Tinnitus    Type 2 diabetes mellitus with morbid obesity (HCC)     Past Surgical History:  Procedure Laterality Date   CARDIOVERSION N/A 01/09/2013   Procedure: CARDIOVERSION;  Surgeon: Redell GORMAN Shallow, MD;  Location: [facility] ENDOSCOPY;  Service: Cardiovascular;  Laterality: N/A;   CARDIOVERSION N/A 10/07/2014   Procedure: CARDIOVERSION;  Surgeon: Redell GORMAN Shallow, MD;  Location: [facility] ENDOSCOPY;  Service: Cardiovascular;  Laterality: N/A;  09:00 Lido 60mg ,IV followed by Propofol   70mg /IV    synched electrocardioversion at 120 joules for Afib,repeated at 200 joules, 70 mg...successfully changed to SR   CARDIOVERSION N/A 12/22/2021   Procedure: CARDIOVERSION;  Surgeon: Kate Lonni CROME, MD;  Location: [facility] ENDOSCOPY;  Service: Cardiovascular;  Laterality: N/A;   COLONOSCOPY     FRACTURE SURGERY  07/21/2012   right hip  arthroplasty   JOINT REPLACEMENT     Left hip Dr. Vernetta 04-07-18   ORIF PATELLA Left 03/28/2023   Procedure: OPEN REDUCTION INTERNAL FIXATION (ORIF) PATELLA;  Surgeon: Melodi Lerner, MD;  Location: [facility] ORS;  Service: Orthopedics;  Laterality: Left;   TOTAL HIP ARTHROPLASTY  07/21/2012   Procedure: TOTAL HIP ARTHROPLASTY;  Surgeon: Lerner LULLA Melodi, MD;  Location: [facility] ORS;  Service: Orthopedics;  Laterality: Right;   TOTAL HIP ARTHROPLASTY Left 04/07/2018   Procedure: LEFT TOTAL HIP ARTHROPLASTY ANTERIOR APPROACH;  Surgeon: Vernetta Lonni GRADE, MD;  Location: [facility] ORS;  Service: Orthopedics;  Laterality: Left;  Needs RNFA     Current Outpatient Medications  Medication Sig Dispense Refill   calcium  carbonate (TUMS EX) 750 MG chewable tablet Chew 1-3 tablets by mouth 3 (three) times daily as needed for heartburn.     Cholecalciferol  (VITAMIN D3) 2000 units TABS Take 2,000 Units by mouth every evening.     clobetasol  ointment (TEMOVATE ) 0.05 % Apply a small amount on the skin twice a day as needed to areas of psoriasis until clear 60 g 2   desonide (DESOWEN) 0.05 % cream Apply 1 application topically 2 (two) times daily as needed (for psoriasis).   0   flecainide  (TAMBOCOR ) 100 MG tablet Take 1 tablet (100 mg total) by mouth daily. 90 tablet 1   Multiple Vitamin (MULTIVITAMIN WITH MINERALS) TABS tablet Take 1 tablet by  mouth daily.     nebivolol  (BYSTOLIC ) 10 MG tablet Take 0.5 tablets (5 mg total) by mouth daily. 45 tablet 1   olmesartan  (BENICAR ) 20 MG tablet Take 1 tablet (20 mg total) by mouth daily. 90 tablet 2   sertraline  (ZOLOFT ) 50 MG tablet Take 1.5 tablets (75 mg total) by mouth at bedtime. 135 tablet 1   acetaminophen  (TYLENOL ) 650 MG CR tablet Take 650-1,300 mg by mouth every 8 (eight) hours as needed for pain. (Patient not taking: Reported on 07/05/2024)     Dulaglutide  (TRULICITY ) 4.5 MG/0.5ML SOAJ Inject 4.5 mg as directed once a week. 6 mL 2   risankizumab -rzaa (SKYRIZI  PEN)  150 MG/ML pen Inject 1 pen injector subcutaneously every four weeks Inject at week 0 and week 4, LOADING DOSE. (Patient not taking: Reported on 07/05/2024) 2 mL 0   rivaroxaban  (XARELTO ) 20 MG TABS tablet Take 1 tablet (20 mg total) by mouth daily. 90 tablet 1   Current Facility-Administered Medications  Medication Dose Route Frequency Provider Last Rate Last Admin   0.9 %  sodium chloride  infusion  500 mL Intravenous Once Avram Lupita BRAVO, MD        Allergies as of 07/05/2024   (No Known Allergies)    Family History  Problem Relation Age of Onset   Hypertension Mother    Diabetes Mother    Alzheimer's disease Mother    Thyroid  disease Mother    Diabetes Son    Colon cancer Neg Hx    Colon polyps Neg Hx    Esophageal cancer Neg Hx    Pancreatic cancer Neg Hx    Stomach cancer Neg Hx    Rectal cancer Neg Hx     Social History   Socioeconomic History   Marital status: Married    Spouse name: [name]   Number of children: 1   Years of education: 12   Highest education level: Not on file  Occupational History   Occupation: ED ADMISSIONS     Employer: [employer]    Comment: [employer]   Tobacco Use   Smoking status: Former    Current packs/day: 0.00    Types: Cigarettes    Quit date: 09/13/1982    Years since quitting: 41.8   Smokeless tobacco: Never  Vaping Use   Vaping status: Never Used  Substance and Sexual Activity   Alcohol  use: Not Currently    Comment: Occasionally   Drug use: No   Sexual activity: Yes    Partners: Male  Other Topics Concern   Not on file  Social History Narrative   Marital Status: Married ([name])    Children:  Son Metallurgist)    Pets: None   Living Situation: Lives with Norleen   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    EducationEngineer, [employer]    Tobacco Use/Exposure: Former Smoker.  She used to smoke 4 cigarettes per day for five years and quit 32 years ago.    Alcohol  Use:   Occasional   Drug Use:  None   Diet:  Regular   Exercise:  Water  Aerobics (2 x per week)     Hobbies: Shopping, Cycling             Social Drivers of Health   Financial Resource Strain: Low Risk  (03/30/2018)   Overall Financial Resource Strain (CARDIA)    Difficulty of Paying Living Expenses: Not hard at all  Food Insecurity: No Food  Insecurity (03/28/2023)   Hunger Vital Sign    Worried About Running Out of Food in the Last Year: Never true    Ran Out of Food in the Last Year: Never true  Transportation Needs: No Transportation Needs (03/28/2023)   PRAPARE - Administrator, Civil Service (Medical): No    Lack of Transportation (Non-Medical): No  Physical Activity: Unknown (03/30/2018)   Exercise Vital Sign    Days of Exercise per Week: Patient declined    Minutes of Exercise per Session: Patient declined  Stress: Not on file  Social Connections: Unknown (03/30/2018)   Social Connection and Isolation Panel    Frequency of Communication with Friends and Family: Patient declined    Frequency of Social Gatherings with Friends and Family: Patient declined    Attends Religious Services: Patient declined    Database Administrator or Organizations: Patient declined    Attends Banker Meetings: Patient declined    Marital Status: Patient declined  Intimate Partner Violence: Not At Risk (03/28/2023)   Humiliation, Afraid, Rape, and Kick questionnaire    Fear of Current or Ex-Partner: No    Emotionally Abused: No    Physically Abused: No    Sexually Abused: No    Review of Systems:  All other review of systems negative except as mentioned in the HPI.  Physical Exam: Vital signs BP (!) 166/118   Pulse 60   Temp (!) 96.8 F (36 C) (Skin)   Resp 19   Ht 5' 5 (1.651 m)   Wt 247 lb 9.6 oz (112.3 kg)   LMP 06/19/2013   SpO2 94%   BMI 41.20 kg/m   General:   Alert,  Well-developed, well-nourished, pleasant and cooperative in NAD Lungs:  Clear  throughout to auscultation.   Heart:  Regular rate and rhythm; no murmurs, clicks, rubs,  or gallops. Abdomen:  Soft, nontender and nondistended. Normal bowel sounds.   Neuro/Psych:  Alert and cooperative. Normal mood and affect. A and O x 3   @Zaide Mcclenahan  CHARLENA Commander, MD, [facility] [phone] (pager) 07/05/2024 1:23 PM@

## 2024-07-05 NOTE — Progress Notes (Signed)
 Sedate, gd SR, tolerated procedure well, VSS, report to RN

## 2024-07-06 ENCOUNTER — Telehealth: Payer: Self-pay

## 2024-07-06 ENCOUNTER — Other Ambulatory Visit: Payer: Self-pay

## 2024-07-06 DIAGNOSIS — I4819 Other persistent atrial fibrillation: Secondary | ICD-10-CM | POA: Diagnosis not present

## 2024-07-06 NOTE — Telephone Encounter (Signed)
 Attempted f/u call. No answer, left VM.

## 2024-07-09 ENCOUNTER — Ambulatory Visit: Payer: Self-pay | Admitting: Gastroenterology

## 2024-07-09 ENCOUNTER — Ambulatory Visit: Payer: Self-pay | Admitting: Cardiology

## 2024-07-09 NOTE — Progress Notes (Signed)
 Left message for patient to cb regarding heart monitor.

## 2024-07-10 ENCOUNTER — Telehealth: Payer: Self-pay | Admitting: Cardiology

## 2024-07-10 DIAGNOSIS — I493 Ventricular premature depolarization: Secondary | ICD-10-CM

## 2024-07-10 DIAGNOSIS — I4819 Other persistent atrial fibrillation: Secondary | ICD-10-CM

## 2024-07-10 LAB — SURGICAL PATHOLOGY

## 2024-07-10 NOTE — Telephone Encounter (Signed)
 Patient is calling about her heart monitor results. Please advise

## 2024-07-10 NOTE — Telephone Encounter (Signed)
 Spoke with pt, aware of monitor results and recommendations. Order placed for echo and referral placed.

## 2024-07-13 ENCOUNTER — Encounter: Payer: Self-pay | Admitting: Pharmacist

## 2024-07-13 NOTE — Progress Notes (Signed)
 Pharmacy Quality Measure Review  This patient is appearing on a report for being at risk of failing the adherence measure for hypertension (ACEi/ARB) medications this calendar year.   Medication: olmesartan  Last fill date: 03/20/2024 for 90 day supply  Reviewed recent refill history in Dr Annemarie database. Actual last refill date was 06/20/2024 for 90 day supply. Patient has 1 refill remaining. Next appointment with PCP is not yet set but last appointment was 07/02/2024.    Insurance report was not up to date. No action needed at this time.   Madelin Ray, PharmD Clinical Pharmacist [facility]  Population Health [phone]

## 2024-07-16 ENCOUNTER — Other Ambulatory Visit: Payer: Self-pay

## 2024-07-17 ENCOUNTER — Other Ambulatory Visit: Payer: Self-pay

## 2024-07-17 ENCOUNTER — Ambulatory Visit: Payer: Self-pay | Admitting: Internal Medicine

## 2024-07-18 ENCOUNTER — Encounter: Payer: Self-pay | Admitting: Family Medicine

## 2024-07-18 ENCOUNTER — Other Ambulatory Visit: Payer: Self-pay

## 2024-07-19 ENCOUNTER — Ambulatory Visit (HOSPITAL_COMMUNITY)
Admission: RE | Admit: 2024-07-19 | Discharge: 2024-07-19 | Disposition: A | Discharge status: Home / Self Care | Source: Ambulatory Visit | Attending: Internal Medicine | Admitting: Internal Medicine

## 2024-07-19 DIAGNOSIS — I493 Ventricular premature depolarization: Secondary | ICD-10-CM | POA: Diagnosis not present

## 2024-07-19 DIAGNOSIS — I4819 Other persistent atrial fibrillation: Secondary | ICD-10-CM | POA: Insufficient documentation

## 2024-07-19 LAB — ECHOCARDIOGRAM COMPLETE
Area-P 1/2: 5.3 cm2
MV M vel: 5.57 m/s
MV Peak grad: 124.2 mmHg
S' Lateral: 3.2 cm

## 2024-07-20 ENCOUNTER — Ambulatory Visit: Payer: Self-pay | Admitting: Cardiology

## 2024-07-20 ENCOUNTER — Other Ambulatory Visit: Payer: Self-pay

## 2024-07-20 ENCOUNTER — Other Ambulatory Visit (HOSPITAL_COMMUNITY): Payer: Self-pay

## 2024-07-23 ENCOUNTER — Other Ambulatory Visit: Payer: Self-pay

## 2024-07-23 ENCOUNTER — Other Ambulatory Visit: Payer: Self-pay | Admitting: Pharmacy Technician

## 2024-07-23 ENCOUNTER — Other Ambulatory Visit (HOSPITAL_COMMUNITY): Payer: Self-pay

## 2024-07-23 ENCOUNTER — Telehealth: Admitting: Family Medicine

## 2024-07-23 DIAGNOSIS — B9689 Other specified bacterial agents as the cause of diseases classified elsewhere: Secondary | ICD-10-CM | POA: Diagnosis not present

## 2024-07-23 DIAGNOSIS — J208 Acute bronchitis due to other specified organisms: Secondary | ICD-10-CM | POA: Diagnosis not present

## 2024-07-23 MED ORDER — BENZONATATE 100 MG PO CAPS
100.0000 mg | ORAL_CAPSULE | Freq: Three times a day (TID) | ORAL | 0 refills | Status: AC | PRN
  Filled 2024-07-23 (×2): qty 30, 10d supply, fill #0

## 2024-07-23 MED ORDER — AZITHROMYCIN 250 MG PO TABS
ORAL_TABLET | ORAL | 0 refills | Status: AC
  Filled 2024-07-23 (×2): qty 6, 5d supply, fill #0

## 2024-07-23 MED ORDER — PROMETHAZINE-DM 6.25-15 MG/5ML PO SYRP
5.0000 mL | ORAL_SOLUTION | Freq: Three times a day (TID) | ORAL | 0 refills | Status: DC | PRN
  Filled 2024-07-23 (×2): qty 118, 8d supply, fill #0

## 2024-07-23 NOTE — Progress Notes (Signed)
 Virtual Visit Consent   Monica [name], you are scheduled for a virtual visit with a [facility] provider today. Just as with appointments in the office, your consent must be obtained to participate. Your consent will be active for this visit and any virtual visit you may have with one of our providers in the next 365 days. If you have a MyChart account, a copy of this consent can be sent to you electronically.  As this is a virtual visit, video technology does not allow for your provider to perform a traditional examination. This may limit your provider's ability to fully assess your condition. If your provider identifies any concerns that need to be evaluated in person or the need to arrange testing (such as labs, EKG, etc.), we will make arrangements to do so. Although advances in technology are sophisticated, we cannot ensure that it will always work on either your end or our end. If the connection with a video visit is poor, the visit may have to be switched to a telephone visit. With either a video or telephone visit, we are not always able to ensure that we have a secure connection.  By engaging in this virtual visit, you consent to the provision of healthcare and authorize for your insurance to be billed (if applicable) for the services provided during this visit. Depending on your insurance coverage, you may receive a charge related to this service.  I need to obtain your verbal consent now. Are you willing to proceed with your visit today? Monica [name] has provided verbal consent on 07/23/2024 for a virtual visit (video or telephone). [name], NP  Date: 07/23/2024 9:33 AM   Virtual Visit via Video Note   I, [name], connected with  Monica [name]  ([id number], 06-10-1956) on 07/23/24 at  9:30 AM EST by a video-enabled telemedicine application and verified that I am speaking with the correct person using two identifiers.  Location: Patient: Virtual Visit Location Patient:  Home Provider: Virtual Visit Location Provider: Home Office   I discussed the limitations of evaluation and management by telemedicine and the availability of in person appointments. The patient expressed understanding and agreed to proceed.    History of Present Illness: Monica [name] is a 68 y.o. 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)  COVID test: no   Problems:  Patient Active Problem List   Diagnosis Date Noted   GAD (generalized anxiety disorder) 06/21/2023   Left patella fracture 03/28/2023   Encounter for annual physical exam 04/04/2022   Hypercoagulable state due to longstanding persistent atrial fibrillation (HCC) 04/04/2022   Depression 09/10/2021   Vitamin D  deficiency 09/10/2021   DM2 (diabetes mellitus, type 2) (HCC) 09/10/2021   Status post left hip replacement 04/07/2018   Unilateral primary osteoarthritis, left hip 02/23/2018   Psoriatic arthropathy (HCC) 11/17/2017   Tinnitus 11/19/2016   Left hip pain 10/21/2015   Gastroesophageal reflux disease 09/19/2015   OSA (obstructive sleep apnea) 02/20/2015   Morbid  obesity (HCC) 01/15/2015   Lymphedema 06/19/2014   History of colonic polyps 12/25/2013   Carpal tunnel syndrome 10/14/2013   Allergic rhinitis 06/20/2013   Edema 12/20/2012   Avascular necrosis of hip (HCC) 07/21/2012   Atrial fibrillation (HCC) 07/06/2012   Essential hypertension 09/14/2011   Psoriasis 08/16/2005    Allergies: No Known Allergies Medications:  Current  Outpatient Medications:    acetaminophen  (TYLENOL ) 650 MG CR tablet, Take 650-1,300 mg by mouth every 8 (eight) hours as needed for pain. (Patient not taking: Reported on 07/05/2024), Disp: , Rfl:    calcium  carbonate (TUMS EX) 750 MG chewable tablet, Chew 1-3 tablets by mouth 3 (three) times daily as needed for heartburn., Disp: , Rfl:    Cholecalciferol  (VITAMIN D3) 2000 units TABS, Take 2,000 Units by mouth every evening., Disp: , Rfl:    clobetasol  ointment (TEMOVATE ) 0.05 %, Apply a small amount on the skin twice a day as needed to areas of psoriasis until clear, Disp: 60 g, Rfl: 2   desonide (DESOWEN) 0.05 % cream, Apply 1 application topically 2 (two) times daily as needed (for psoriasis). , Disp: , Rfl: 0   Dulaglutide  (TRULICITY ) 4.5 MG/0.5ML SOAJ, Inject 4.5 mg as directed once a week., Disp: 6 mL, Rfl: 2   flecainide  (TAMBOCOR ) 100 MG tablet, Take 1 tablet (100 mg total) by mouth daily., Disp: 90 tablet, Rfl: 1   Multiple Vitamin (MULTIVITAMIN WITH MINERALS) TABS tablet, Take 1 tablet by mouth daily., Disp: , Rfl:    nebivolol  (BYSTOLIC ) 10 MG tablet, Take 0.5 tablets (5 mg total) by mouth daily., Disp: 45 tablet, Rfl: 1   olmesartan  (BENICAR ) 20 MG tablet, Take 1 tablet (20 mg total) by mouth daily., Disp: 90 tablet, Rfl: 2   risankizumab -rzaa (SKYRIZI  PEN) 150 MG/ML pen, Inject 1 pen injector subcutaneously every four weeks Inject at week 0 and week 4, LOADING DOSE. (Patient not taking: Reported on 07/05/2024), Disp: 2 mL, Rfl: 0   rivaroxaban  (XARELTO ) 20 MG TABS tablet, Take 1 tablet (20 mg total) by mouth daily., Disp: 90 tablet, Rfl: 1   sertraline  (ZOLOFT ) 50 MG tablet, Take 1.5 tablets (75 mg total) by mouth at bedtime., Disp: 135 tablet, Rfl: 1  Observations/Objective: Patient is well-developed, well-nourished in no acute distress.  Resting comfortably  at home.  Head is normocephalic, atraumatic.  No labored breathing.  Speech is clear and coherent with logical content.   Patient is alert and oriented at baseline.  Cough present Hoarseness   Assessment and Plan:  1. Acute bacterial bronchitis (Primary)  - benzonatate  (TESSALON ) 100 MG capsule; Take 1 capsule (100 mg total) by mouth 3 (three) times daily as needed for cough.  Dispense: 30 capsule; Refill: 0 - promethazine -dextromethorphan (PROMETHAZINE -DM) 6.25-15 MG/5ML syrup; Take 5 mLs by mouth 3 (three) times daily as needed for cough.  Dispense: 118 mL; Refill: 0 - azithromycin  (ZITHROMAX ) 250 MG tablet; Take 2 tablets on day 1, then 1 tablet daily on days 2 through 5  Dispense: 6 tablet; Refill: 0  - Take meds as prescribed - Rest voice - Use a cool mist humidifier especially during the winter months when heat dries out the air. - Use saline nose sprays frequently to help soothe nasal passages if they are drying out. - Stay hydrated by drinking plenty of fluids - Keep thermostat turn down low to prevent drying out which can cause a dry cough. - For fever or aches or pains- take tylenol  or ibuprofen  as directed on bottle             *  for fevers greater than 101 orally you may alternate ibuprofen  and tylenol  every 3 hours.  If you do not improve you will need a follow up visit in person.                Reviewed side effects, risks and benefits of medication.    Patient acknowledged agreement and understanding of the plan.   Past Medical, Surgical, Social History, Allergies, and Medications have been Reviewed.     Follow Up Instructions: I discussed the assessment and treatment plan with the patient. The patient was provided an opportunity to ask questions and all were answered. The patient agreed with the plan and demonstrated an understanding of the instructions.  A copy of instructions were sent to the patient via MyChart unless otherwise noted below.     The patient was advised to call back or seek an in-person evaluation if the symptoms worsen or if the condition fails to improve as  anticipated.    [name], NP

## 2024-07-23 NOTE — Patient Instructions (Signed)
 Monica [name], thank you for joining Monica CHRISTELLA Barefoot, NP for today's virtual visit.  While this provider is not your primary care provider (PCP), if your PCP is located in our provider database this encounter information will be shared with them immediately following your visit.   A [facility] MyChart account gives you access to today's visit and all your visits, tests, and labs performed at [facility]  click here if you don't have a  MyChart account or go to [email or web address]  Consent: (Patient) Monica [name] provided verbal consent for this virtual visit at the beginning of the encounter.  Current Medications:  Current Outpatient Medications:    azithromycin  (ZITHROMAX ) 250 MG tablet, Take 2 tablets on day 1, then 1 tablet daily on days 2 through 5, Disp: 6 tablet, Rfl: 0   benzonatate  (TESSALON ) 100 MG capsule, Take 1 capsule (100 mg total) by mouth 3 (three) times daily as needed for cough., Disp: 30 capsule, Rfl: 0   promethazine -dextromethorphan (PROMETHAZINE -DM) 6.25-15 MG/5ML syrup, Take 5 mLs by mouth 3 (three) times daily as needed for cough., Disp: 118 mL, Rfl: 0   acetaminophen  (TYLENOL ) 650 MG CR tablet, Take 650-1,300 mg by mouth every 8 (eight) hours as needed for pain. (Patient not taking: Reported on 07/05/2024), Disp: , Rfl:    calcium  carbonate (TUMS EX) 750 MG chewable tablet, Chew 1-3 tablets by mouth 3 (three) times daily as needed for heartburn., Disp: , Rfl:    Cholecalciferol  (VITAMIN D3) 2000 units TABS, Take 2,000 Units by mouth every evening., Disp: , Rfl:    clobetasol  ointment (TEMOVATE ) 0.05 %, Apply a small amount on the skin twice a day as needed to areas of psoriasis until clear, Disp: 60 g, Rfl: 2   desonide (DESOWEN) 0.05 % cream, Apply 1 application topically 2 (two) times daily as needed (for psoriasis). , Disp: , Rfl: 0   Dulaglutide  (TRULICITY ) 4.5 MG/0.5ML SOAJ, Inject 4.5 mg as directed once a week., Disp: 6 mL, Rfl: 2    flecainide  (TAMBOCOR ) 100 MG tablet, Take 1 tablet (100 mg total) by mouth daily., Disp: 90 tablet, Rfl: 1   Multiple Vitamin (MULTIVITAMIN WITH MINERALS) TABS tablet, Take 1 tablet by mouth daily., Disp: , Rfl:    nebivolol  (BYSTOLIC ) 10 MG tablet, Take 0.5 tablets (5 mg total) by mouth daily., Disp: 45 tablet, Rfl: 1   olmesartan  (BENICAR ) 20 MG tablet, Take 1 tablet (20 mg total) by mouth daily., Disp: 90 tablet, Rfl: 2   risankizumab -rzaa (SKYRIZI  PEN) 150 MG/ML pen, Inject 1 pen injector subcutaneously every four weeks Inject at week 0 and week 4, LOADING DOSE. (Patient not taking: Reported on 07/05/2024), Disp: 2 mL, Rfl: 0   rivaroxaban  (XARELTO ) 20 MG TABS tablet, Take 1 tablet (20 mg total) by mouth daily., Disp: 90 tablet, Rfl: 1   sertraline  (ZOLOFT ) 50 MG tablet, Take 1.5 tablets (75 mg total) by mouth at bedtime., Disp: 135 tablet, Rfl: 1   Medications ordered in this encounter:  Meds ordered this encounter  Medications   benzonatate  (TESSALON ) 100 MG capsule    Sig: Take 1 capsule (100 mg total) by mouth 3 (three) times daily as needed for cough.    Dispense:  30 capsule    Refill:  0    Supervising Provider:   LAMPTEY, PHILIP O [[id number]]   promethazine -dextromethorphan (PROMETHAZINE -DM) 6.25-15 MG/5ML syrup    Sig: Take 5 mLs by mouth 3 (three) times daily as needed for cough.  Dispense:  118 mL    Refill:  0    Supervising Provider:   BLAISE ALEENE KIDD [id number]   azithromycin  (ZITHROMAX ) 250 MG tablet    Sig: Take 2 tablets on day 1, then 1 tablet daily on days 2 through 5    Dispense:  6 tablet    Refill:  0    Supervising Provider:   LAMPTEY, PHILIP O [phone]     *If you need refills on other medications prior to your next appointment, please contact your pharmacy*  Follow-Up: Call back or seek an in-person evaluation if the symptoms worsen or if the condition fails to improve as anticipated.  [facility] [phone]  Other  Instructions   - Take meds as prescribed - Rest voice - Use a cool mist humidifier especially during the winter months when heat dries out the air. - Use saline nose sprays frequently to help soothe nasal passages if they are drying out. - Stay hydrated by drinking plenty of fluids - Keep thermostat turn down low to prevent drying out which can cause a dry cough.  - For fever or aches or pains- take tylenol  or ibuprofen  as directed on bottle             * for fevers greater than 101 orally you may alternate ibuprofen  and tylenol  every 3 hours.  If you do not improve you will need a follow up visit in person.                 If you have been instructed to have an in-person evaluation today at a local Urgent Care facility, please use the link below. It will take you to a list of all of our available [facility], including address, phone number and hours of operation. Please do not delay care.  [facility]  If you or a family member do not have a primary care provider, use the link below to schedule a visit and establish care. When you choose a [facility] primary care physician or advanced practice provider, you gain a long-term partner in health. Find a Primary Care Provider  Learn more about [facility]'s in-office and virtual care options: [facility] - Get Care Now

## 2024-07-23 NOTE — Progress Notes (Signed)
 Specialty Pharmacy Refill Coordination Note  LYFE REIHL is a 68 y.o. female contacted today regarding refills of specialty medication(s) Risankizumab -rzaa (Skyrizi  Pen)   Patient requested Delivery   Delivery date: 07/25/24   Verified address: [address]  [facility] [place]   Medication will be filled on: 07/24/24    This fill date is pending response to refill request from provider. Patient is aware and if they have not received fill by intended date they must follow up with pharmacy.

## 2024-07-24 ENCOUNTER — Other Ambulatory Visit: Payer: Self-pay

## 2024-07-30 ENCOUNTER — Other Ambulatory Visit: Payer: Self-pay

## 2024-07-30 ENCOUNTER — Other Ambulatory Visit: Payer: Self-pay | Admitting: Family Medicine

## 2024-07-30 ENCOUNTER — Other Ambulatory Visit: Payer: Self-pay | Admitting: Cardiology

## 2024-07-30 ENCOUNTER — Other Ambulatory Visit (HOSPITAL_BASED_OUTPATIENT_CLINIC_OR_DEPARTMENT_OTHER): Payer: Self-pay

## 2024-07-30 ENCOUNTER — Other Ambulatory Visit (HOSPITAL_COMMUNITY): Payer: Self-pay

## 2024-07-30 DIAGNOSIS — I4819 Other persistent atrial fibrillation: Secondary | ICD-10-CM

## 2024-07-30 DIAGNOSIS — B9689 Other specified bacterial agents as the cause of diseases classified elsewhere: Secondary | ICD-10-CM

## 2024-07-30 DIAGNOSIS — I480 Paroxysmal atrial fibrillation: Secondary | ICD-10-CM

## 2024-07-30 MED ORDER — PROMETHAZINE-DM 6.25-15 MG/5ML PO SYRP
5.0000 mL | ORAL_SOLUTION | Freq: Three times a day (TID) | ORAL | 0 refills | Status: AC | PRN
  Filled 2024-07-30 – 2024-08-03 (×3): qty 118, 8d supply, fill #0

## 2024-07-31 ENCOUNTER — Other Ambulatory Visit (HOSPITAL_COMMUNITY): Payer: Self-pay

## 2024-07-31 ENCOUNTER — Other Ambulatory Visit (HOSPITAL_BASED_OUTPATIENT_CLINIC_OR_DEPARTMENT_OTHER): Payer: Self-pay

## 2024-08-01 ENCOUNTER — Other Ambulatory Visit: Payer: Self-pay

## 2024-08-01 ENCOUNTER — Encounter: Payer: Self-pay | Admitting: Pharmacist

## 2024-08-03 ENCOUNTER — Other Ambulatory Visit (HOSPITAL_COMMUNITY): Payer: Self-pay

## 2024-08-03 ENCOUNTER — Other Ambulatory Visit: Payer: Self-pay

## 2024-08-27 ENCOUNTER — Other Ambulatory Visit (HOSPITAL_COMMUNITY): Payer: Self-pay

## 2024-08-27 ENCOUNTER — Other Ambulatory Visit: Payer: Self-pay

## 2024-08-27 NOTE — Progress Notes (Signed)
 Refills have been denied multiple times. Patient needs appointment. Unable to leave a voice mail - voice mail full.

## 2024-08-31 ENCOUNTER — Other Ambulatory Visit (HOSPITAL_BASED_OUTPATIENT_CLINIC_OR_DEPARTMENT_OTHER): Payer: Self-pay

## 2024-08-31 ENCOUNTER — Other Ambulatory Visit (HOSPITAL_COMMUNITY): Payer: Self-pay

## 2024-08-31 ENCOUNTER — Other Ambulatory Visit: Payer: Self-pay | Admitting: Family Medicine

## 2024-08-31 DIAGNOSIS — F411 Generalized anxiety disorder: Secondary | ICD-10-CM

## 2024-08-31 MED ORDER — SERTRALINE HCL 50 MG PO TABS
75.0000 mg | ORAL_TABLET | Freq: Every day | ORAL | 1 refills | Status: AC
  Filled 2024-08-31 (×2): qty 135, 90d supply, fill #0

## 2024-09-07 ENCOUNTER — Other Ambulatory Visit: Payer: Self-pay

## 2024-09-07 ENCOUNTER — Other Ambulatory Visit (HOSPITAL_COMMUNITY): Payer: Self-pay

## 2024-09-10 ENCOUNTER — Other Ambulatory Visit: Payer: Self-pay

## 2024-09-10 MED ORDER — SKYRIZI PEN 150 MG/ML ~~LOC~~ SOAJ
SUBCUTANEOUS | 1 refills | Status: AC
  Filled 2024-09-10 – 2024-09-12 (×2): qty 1, 84d supply, fill #0

## 2024-09-12 ENCOUNTER — Other Ambulatory Visit: Payer: Self-pay

## 2024-09-12 ENCOUNTER — Other Ambulatory Visit (HOSPITAL_COMMUNITY): Payer: Self-pay

## 2024-09-12 NOTE — Progress Notes (Signed)
 Specialty Pharmacy Refill Coordination Note  Monica [name] is a 69 y.o. female contacted today regarding refills of specialty medication(s) Risankizumab -rzaa (Skyrizi  Pen)   Patient requested Delivery   Delivery date: 09/14/24   Verified address: [address]  [facility] [place]   Medication will be filled on: 09/13/24

## 2024-09-13 ENCOUNTER — Other Ambulatory Visit: Payer: Self-pay

## 2024-09-21 ENCOUNTER — Telehealth (HOSPITAL_COMMUNITY): Payer: Self-pay

## 2024-09-21 ENCOUNTER — Other Ambulatory Visit: Payer: Self-pay

## 2024-09-21 ENCOUNTER — Other Ambulatory Visit (HOSPITAL_COMMUNITY): Payer: Self-pay

## 2024-09-21 ENCOUNTER — Other Ambulatory Visit (HOSPITAL_BASED_OUTPATIENT_CLINIC_OR_DEPARTMENT_OTHER): Payer: Self-pay

## 2024-09-21 NOTE — Telephone Encounter (Signed)
No PA is needed at this time
